# Patient Record
Sex: Male | Born: 1975 | Race: White | Hispanic: No | Marital: Married | Smoking: Never smoker
Health system: Southern US, Community
[De-identification: ages and names within clinical notes are randomized; demographics above are authoritative.]

## PROBLEM LIST (undated history)

## (undated) DIAGNOSIS — I10 Essential (primary) hypertension: Secondary | ICD-10-CM

## (undated) DIAGNOSIS — Z87442 Personal history of urinary calculi: Secondary | ICD-10-CM

## (undated) DIAGNOSIS — G8929 Other chronic pain: Secondary | ICD-10-CM

## (undated) DIAGNOSIS — N200 Calculus of kidney: Secondary | ICD-10-CM

## (undated) DIAGNOSIS — Z800 Family history of malignant neoplasm of digestive organs: Secondary | ICD-10-CM

## (undated) DIAGNOSIS — E785 Hyperlipidemia, unspecified: Secondary | ICD-10-CM

## (undated) DIAGNOSIS — K219 Gastro-esophageal reflux disease without esophagitis: Secondary | ICD-10-CM

## (undated) DIAGNOSIS — M1990 Unspecified osteoarthritis, unspecified site: Secondary | ICD-10-CM

## (undated) HISTORY — DX: Essential (primary) hypertension: I10

## (undated) HISTORY — PX: COLONOSCOPY: SHX174

## (undated) HISTORY — PX: TONSILLECTOMY: SUR1361

## (undated) HISTORY — DX: Family history of malignant neoplasm of digestive organs: Z80.0

## (undated) HISTORY — DX: Other chronic pain: G89.29

## (undated) HISTORY — PX: VASECTOMY: SHX75

## (undated) HISTORY — DX: Hyperlipidemia, unspecified: E78.5

## (undated) HISTORY — PX: HAND SURGERY: SHX662

---
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
## 2006-07-23 ENCOUNTER — Inpatient Hospital Stay (HOSPITAL_COMMUNITY): Admission: AD | Admit: 2006-07-23 | Discharge: 2006-07-25 | Payer: Self-pay | Admitting: Psychiatry

## 2006-07-23 ENCOUNTER — Ambulatory Visit: Payer: Self-pay | Admitting: Psychiatry

## 2008-11-04 ENCOUNTER — Encounter: Admission: RE | Admit: 2008-11-04 | Discharge: 2008-11-04 | Payer: Self-pay | Admitting: Neurosurgery

## 2010-11-17 NOTE — H&P (Signed)
[name], SPEAKMAN NO.:  [id number]   MEDICAL RECORD NO.:  [id number]          PATIENT TYPE:  IPS   LOCATION:  0305                          FACILITY:  [facility]   PHYSICIAN:  Anselm Jungling, MD  DATE OF BIRTH:  Jan 03, 1976   DATE OF ADMISSION:  07/23/2006  DATE OF DISCHARGE:                       PSYCHIATRIC ADMISSION ASSESSMENT   IDENTIFICATION:  This is a 35 year old married white male.  This is a  voluntary admission.   HISTORY OF PRESENT ILLNESS:  This patient presented in the emergency  room after taking an overdose of approximately 20 tablets of Klonopin  0.5 mg after an argument over the phone with his wife.  The patient  reports he has been separated from his wife for about 10 weeks after a  marriage of nine years.  He was arguing with his wife on the phone  trying to coax her to reconcile with him and she mentioned that she was  ready to sell the house.  She has been giving him the impression that  she wants to continue the separation and will not commit to him to  reconcile the marriage.  The patient says that he is unable to accept  the idea that they are separated and that he is without his family.  He  has gone to live with his mother and she is supportive of him.  He is  frustrated with the situation with his wife.  After taking the overdose,  he immediately regretted his actions and drove himself to the [facility] Emergency Room where he was stabilized, observed for several  hours and medically cleared.  Denies any homicidal thoughts or  hallucinations.   PAST PSYCHIATRIC HISTORY:  The patient has been managed by his PCP, Dr.  Bennie Pierini in [place] only.  He has no psychiatrist.  This is his  first psychiatric admission.  He has a history of prior overdose at age  60 over breakup with the girlfriend who happens to have been the same  woman that he has been married to.  At that time, he spent several days  in the ICU but was not  admitted for psychiatric care and had no  psychiatric treatment after the overdose.  He has recently been  prescribed Lexapro 10 mg daily by his PCP which he did not continue to  take because it caused him stomach upset.  No other history of  medications.   SOCIAL HISTORY:  This is a married white male with basic education,  currently working as a Manufacturing engineer.  He has two children,  ages 26 and 54 years old.  They are currently living in the home with his  ex-wife.  He had been married for the past nine years but they had been  together for at least 13 years.  The patient is able to get visitation  with the children on the weekends.  He has gone to live with his mother  who is supportive of him.  He has no current legal charges.   FAMILY HISTORY:  Family history is remarkable for a sister with bipolar  disorder, father with history of suicide attempt by putting a gun in his  mouth when he was younger.   ALCOHOL/DRUG HISTORY:  The patient denies any current or past history of  substance abuse.   MEDICAL HISTORY:  The patient is followed by Dr. Bennie Pierini in  [place], [place].  Current medical problems are occasional  problems with indigestion for which she uses Nexium 40 mg daily.  He has  a history of IBS but takes no particular medicines for this.  On rare  occasions, he will take Imodium 2 mg.  Past medical history is  remarkable for a hospitalization at age 45 for overdose of unknown  substance.  No history of surgeries.  No history of brain injury, coma  or seizure, blackouts or memory loss.  No history of diabetes or  myocardial infarct, hypertension or asthma.   MEDICATIONS:  Klonopin 0.5 mg p.o. b.i.d. p.r.n. for anxiety, Lexapro 10  mg which he stopped taking due to indigestion and Nexium 40 mg p.r.n.  indigestion daily and Imodium 2 mg p.r.n. diarrhea, up to 6 tablets  daily.   ALLERGIES:  None.   POSITIVE PHYSICAL FINDINGS:  The patient's full  physical exam was done  in the emergency room.  He is fully alert, had no respiratory  compromise.  Full physical exam is noted in the record and he did not  receive charcoal or other intervention.  Was closely monitored for about  six hours and was medically cleared.  Vital signs on presentation  include temperature 96.2, pulse 86, respirations 18, blood pressure  151/81 and pulse ox was 100%.   LABORATORY DATA:  Diagnostic studies were done in the emergency room.  CBC with WBC 9.1, hemoglobin 15.2, hematocrit 43.3, platelets 273,000,  MCV is 85.  Chemistries with sodium 134, potassium 3.5, chloride 99,  carbon dioxide 28, BUN 8, creatinine 1.0 and random glucose was 121.  Liver enzymes SGOT 21, SGPT 42, alkaline phosphatase 89 and total  bilirubin is 0.5.  Acetaminophen level was less than 10.  Urinalysis was  within normal limits.  TSH is currently pending.   MENTAL STATUS EXAM:  Fully alert male, pleasant, cooperative, medium  build with affect flattening, slight psychomotor slowing.  He appears to  be quite depressed.  Poor eye contact.  Speech is soft in tone,  decreased in production, adequately fluent and articulate.  Mood is  depressed and helpless.  He talks a lot about his frustration with the  marriage.  Apparently, his wife perceives him as jealous.  He is  attempting to get her to commit to reconciling the marriage.  He is  unable to picture life without his family or the marriage, says he  really loves her and wants to be back together.  Has not thought about  the possibility of what life would be like if she insists on divorce.  Thought process logical and coherent.  His insight is limited.  Impulse  control and judgment within normal limits.  No homicidal thought.  No  evidence of psychosis.  No guarding or flight of ideas.  Cognition is  well-preserved.  Concentration and calculation are intact.   DIAGNOSES:  AXIS I:  Major depression, recurrent, severe.  AXIS II:   Deferred. AXIS III:  History of irritable bowel syndrome, status post  benzodiazepine overdose.  AXIS IV:  Severe (problems with marital conflict and separation, having  a stable home and supportive parent is an asset to him).  AXIS  V:  Current 30; past year 74.   PLAN:  To voluntarily admit the patient to alleviate his suicidal  thought.  We are going to start him on Wellbutrin XL 150 mg daily.  We  will go ahead and resume his Nexium as needed and give him trazodone 50  mg h.s. p.r.n. insomnia and that may be repeated x1.  We are going to  consider the possibility of contacting his wife for a family session,  see if she is willing to talk things out with him here and the patient  is in agreement with the plan.      Margaret A. Lorin Picket, N.P.      Anselm Jungling, MD  Electronically Signed    MAS/MEDQ  D:  07/23/2006  T:  07/23/2006  Job:  [id number]

## 2010-11-17 NOTE — Discharge Summary (Signed)
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  He was admitted after a suicide gesture that  consisted of probably nonlethal dose of Klonopin.  He had been receiving  Klonopin as prescribed by Adam Vaughn, his primary care physician.  This  suicide gesture followed an argument with his wife, from whom he had  been separated for approximately nine weeks.  He had recently been given  trials of Lexapro and Cymbalta by his doctor, but had gastrointestinal  intolerance with both and they were discontinued.  He was not involved  in any alcohol or drug abuse.  He had been seeing a therapist, whose  first name is Adam Vaughn, on a one-to-one basis.  Please refer to the  admission note for further details pertaining to the symptoms,  circumstances and history that led to his hospitalization.   INITIAL DIAGNOSTIC IMPRESSION:  He was given an initial AXIS I diagnosis  of rule out mood disorder not otherwise specified, rule out adjustment  disorder with depressed mood, and marital problem not otherwise  specified.   MEDICAL/LABORATORY:  The patient is in good health without any active or  chronic medical problems.  He was medically and physically assessed by  the psychiatric nurse practitioner.  There were no significant medical  issues.  He had been medically cleared from his overdose in an emergency  department prior to his transfer to [place].   HOSPITAL COURSE:  The patient was admitted to the adult inpatient  psychiatric service.   He presented as a well-nourished, well-developed  male who was pleasant, well-organized, fully alert and oriented.  His  mood was depressed with sad affect.  There were no signs or symptoms of  psychosis or thought disorder.  He was fairly open about his situation,  and it appeared that his current marital situation was the basis for his  current crisis.  He described having been involved with his wife since  he was 83 years old, and having been married for 10 or 11 years.  He  admitted that he has had problems with being pathologically jealous  regarding his wife and somewhat controlling.  This had caused alienation  in their relationship and his wife and asked for more space.  This  included their formal marital separation.  The patient indicated that he  did not want the separation and did not want a divorce.   We discussed a trial of an antidepressant medication and, given that he  had been intolerant of serotonin-inducing medications in the past,  Wellbutrin XL 150 mg daily was chosen.  This was well-tolerated.   We felt  that it would be good for the patient's wife to come in for a  family meeting but, on the first hospital day, she indicated that she  would not come in because of icy roads.  The following day, she was not  able to come in for a family meeting either, ostensibly because she was  not able to get off work.   In further discussion with the patient, he stated that he felt that he  had decided he needed to give his wife the space that she was asking  for, and that perhaps she just was not ready for a family meeting in  this kind of setting.  He indicated that he was not feeling suicidal.  Regarding his overdose, he stated I feel I made a mistake in taking the  pills.  I regret the hell out of it, I don't want to die.   On the third hospital day, the patient indicated that he was feeling  better, and felt a need to return to work.  He was absent suicidal  ideation  still, and agreed to the following aftercare plan.   AFTERCARE:  The patient was to follow up at [facility], for an appointment with Adam Vaughn on July 30, 2006.  Also, on July 30, 2006, he was to be seen by [facility] by his usual therapist, Adam Vaughn.   DISCHARGE MEDICATIONS:  Wellbutrin XL 150 mg daily.   DISCHARGE DIAGNOSES:  AXIS I:  Depressive disorder not otherwise  specified.  Rule out adjustment disorder with depressed mood.  Marital  problem.  AXIS II:  Deferred.  AXIS III:  No acute or chronic illnesses.  AXIS IV:  Stressors:  Severe.  AXIS V:  GAF on discharge 70.      Adam Vaughn, Adam Vaughn  Electronically Signed     SPB/MEDQ  D:  07/26/2006  T:  07/26/2006  Job:  [id number]

## 2014-08-27 ENCOUNTER — Telehealth: Payer: Self-pay | Admitting: Physician Assistant

## 2014-08-27 NOTE — Telephone Encounter (Deleted)
Patient requesting refills of Norco. I have advised that I am comfortable with filling prescription once more until patient is seen by orthopedics, or if he prefers will refer to chronic pain, as I am uncomfortable and not qualified with managing his chronic back pain.  Philis Fendt, MS, PA-C   6:23 PM, 08/27/2014

## 2014-08-28 NOTE — Telephone Encounter (Signed)
Encounter opened in error. Philis Fendt, MS, PA-C   6:36 PM, 08/28/2014

## 2017-07-10 ENCOUNTER — Emergency Department (HOSPITAL_COMMUNITY)
Admission: EM | Admit: 2017-07-10 | Discharge: 2017-07-10 | Disposition: A | Discharge status: Home / Self Care | Payer: Self-pay | Attending: Emergency Medicine | Admitting: Emergency Medicine

## 2017-07-10 ENCOUNTER — Encounter (HOSPITAL_COMMUNITY): Payer: Self-pay | Admitting: *Deleted

## 2017-07-10 ENCOUNTER — Emergency Department (HOSPITAL_COMMUNITY): Payer: Self-pay

## 2017-07-10 DIAGNOSIS — N201 Calculus of ureter: Secondary | ICD-10-CM

## 2017-07-10 DIAGNOSIS — R112 Nausea with vomiting, unspecified: Secondary | ICD-10-CM | POA: Insufficient documentation

## 2017-07-10 DIAGNOSIS — R42 Dizziness and giddiness: Secondary | ICD-10-CM | POA: Insufficient documentation

## 2017-07-10 DIAGNOSIS — R61 Generalized hyperhidrosis: Secondary | ICD-10-CM | POA: Insufficient documentation

## 2017-07-10 DIAGNOSIS — Z87442 Personal history of urinary calculi: Secondary | ICD-10-CM | POA: Insufficient documentation

## 2017-07-10 HISTORY — DX: Calculus of kidney: N20.0

## 2017-07-10 LAB — URINALYSIS, ROUTINE W REFLEX MICROSCOPIC
Bacteria, UA: NONE SEEN
Bilirubin Urine: NEGATIVE
Glucose, UA: NEGATIVE mg/dL
Ketones, ur: NEGATIVE mg/dL
Leukocytes, UA: NEGATIVE
Nitrite: NEGATIVE
Protein, ur: 30 mg/dL — AB
Specific Gravity, Urine: 1.023 (ref 1.005–1.030)
WBC, UA: NONE SEEN WBC/hpf (ref 0–5)
pH: 6 (ref 5.0–8.0)

## 2017-07-10 LAB — COMPREHENSIVE METABOLIC PANEL
ALT: 44 U/L (ref 17–63)
AST: 28 U/L (ref 15–41)
Albumin: 4.2 g/dL (ref 3.5–5.0)
Alkaline Phosphatase: 91 U/L (ref 38–126)
Anion gap: 8 (ref 5–15)
BUN: 14 mg/dL (ref 6–20)
CO2: 22 mmol/L (ref 22–32)
Calcium: 9.5 mg/dL (ref 8.9–10.3)
Chloride: 108 mmol/L (ref 101–111)
Creatinine, Ser: 1.26 mg/dL — ABNORMAL HIGH (ref 0.61–1.24)
GFR calc Af Amer: >60 mL/min (ref >60)
GFR calc non Af Amer: >60 mL/min (ref >60)
Glucose, Bld: 135 mg/dL — ABNORMAL HIGH (ref 65–99)
Potassium: 3.9 mmol/L (ref 3.5–5.1)
Sodium: 138 mmol/L (ref 135–145)
Total Bilirubin: 1.1 mg/dL (ref 0.3–1.2)
Total Protein: 7 g/dL (ref 6.5–8.1)

## 2017-07-10 LAB — CBC
HCT: 44.4 % (ref 39.0–52.0)
Hemoglobin: 15.3 g/dL (ref 13.0–17.0)
MCH: 29.3 pg (ref 26.0–34.0)
MCHC: 34.5 g/dL (ref 30.0–36.0)
MCV: 85.1 fL (ref 78.0–100.0)
Platelets: 267 10*3/uL (ref 150–400)
RBC: 5.22 MIL/uL (ref 4.22–5.81)
RDW: 12.2 % (ref 11.5–15.5)
WBC: 14.4 10*3/uL — ABNORMAL HIGH (ref 4.0–10.5)

## 2017-07-10 MED ORDER — MORPHINE SULFATE (PF) 4 MG/ML IV SOLN
4.0000 mg | Freq: Once | INTRAVENOUS | Status: AC
  Administered 2017-07-10: 4 mg via INTRAVENOUS
  Filled 2017-07-10: qty 1

## 2017-07-10 MED ORDER — TAMSULOSIN HCL 0.4 MG PO CAPS: 0.4000 mg | ORAL_CAPSULE | Freq: Every day | ORAL | 0 refills | Status: DC

## 2017-07-10 MED ORDER — ONDANSETRON HCL 4 MG PO TABS: 4.0000 mg | ORAL_TABLET | Freq: Four times a day (QID) | ORAL | 0 refills | Status: DC

## 2017-07-10 MED ORDER — IBUPROFEN 600 MG PO TABS: 600.0000 mg | ORAL_TABLET | Freq: Four times a day (QID) | ORAL | 0 refills | Status: DC | PRN

## 2017-07-10 MED ORDER — HYDROCODONE-ACETAMINOPHEN 5-325 MG PO TABS: 1.0000 | ORAL_TABLET | Freq: Four times a day (QID) | ORAL | 0 refills | Status: DC | PRN

## 2017-07-10 MED ORDER — SODIUM CHLORIDE 0.9 % IV BOLUS (SEPSIS)
1000.0000 mL | Freq: Once | INTRAVENOUS | Status: AC
  Administered 2017-07-10: 1000 mL via INTRAVENOUS

## 2017-07-10 MED ORDER — ONDANSETRON HCL 4 MG/2ML IJ SOLN
4.0000 mg | Freq: Once | INTRAMUSCULAR | Status: AC
  Administered 2017-07-10: 4 mg via INTRAVENOUS
  Filled 2017-07-10: qty 2

## 2017-07-10 MED ORDER — KETOROLAC TROMETHAMINE 30 MG/ML IJ SOLN
30.0000 mg | Freq: Once | INTRAMUSCULAR | Status: AC
  Administered 2017-07-10: 30 mg via INTRAVENOUS
  Filled 2017-07-10: qty 1

## 2017-07-10 NOTE — Discharge Instructions (Signed)
Medications: Norco, ibuprofen, Zofran, Flomax  Treatment: Take 1-2 Norco every 4-6 hours as needed for severe pain.  Take ibuprofen every 6 hours.  Take Zofran every 6 hours as needed for nausea or vomiting.  Take Flomax once daily after dinner, before bedtime.  Follow-up: Please follow-up with urologist as noted in your discharge paperwork.  Please return to the emergency department if you develop any new or worsening symptoms.

## 2017-07-10 NOTE — ED Notes (Signed)
Care handoff to [facility]

## 2017-07-10 NOTE — ED Notes (Signed)
Pt verbalized understanding of discharge paperwork, prescriptions, and follow-up care. 

## 2017-07-10 NOTE — ED Provider Notes (Signed)
[facility] EMERGENCY DEPARTMENT Provider Note   CSN: [id number] Arrival date & time: 07/10/17  0901     History   Chief Complaint Chief Complaint  Patient presents with  . Abdominal Pain  . Back Pain    HPI Adam Vaughn is a 42 y.o. male with history of kidney stones who presents with a 3-week history of right flank and right lower quadrant pain.  He reports for the first few weeks his pain was dull, however became much worse last night.  He has had nausea and vomiting as well as diaphoresis and lightheadedness.  His pain is sharp.  He reports he feels different than his past kidney stone.  He reports he did have some radiation to his testicles last night.  He denies that now.  He reports he has seen some blood in his urine recently.  He took cranberry pill last night, however denies any other medications prior to arrival. He denies chest pain or shortness of breath.  HPI  Past Medical History:  Diagnosis Date  . Kidney stones     There are no active problems to display for this patient.   Past Surgical History:  Procedure Laterality Date  . TONSILLECTOMY         Home Medications    Prior to Admission medications   Medication Sig Start Date End Date Taking? Authorizing Provider  HYDROcodone-acetaminophen (NORCO/VICODIN) 5-325 MG tablet Take 1-2 tablets by mouth every 6 (six) hours as needed for severe pain. 07/10/17   Burnice Oestreicher, Bea Graff, PA-C  ibuprofen (ADVIL,MOTRIN) 600 MG tablet Take 1 tablet (600 mg total) by mouth every 6 (six) hours as needed. 07/10/17   Margrett Kalb, Bea Graff, PA-C  ondansetron (ZOFRAN) 4 MG tablet Take 1 tablet (4 mg total) by mouth every 6 (six) hours. 07/10/17   Sareena Odeh, Bea Graff, PA-C  tamsulosin (FLOMAX) 0.4 MG CAPS capsule Take 1 capsule (0.4 mg total) by mouth daily after supper. 07/10/17   Frederica Kuster, PA-C    Family History No family history on file.  Social History Social History   Tobacco Use  . Smoking status: Not on  file  Substance Use Topics  . Alcohol use: Not on file  . Drug use: Not on file     Allergies   Patient has no known allergies.   Review of Systems Review of Systems  Constitutional: Positive for diaphoresis. Negative for chills and fever.  HENT: Negative for facial swelling and sore throat.   Respiratory: Negative for shortness of breath.   Cardiovascular: Negative for chest pain.  Gastrointestinal: Positive for abdominal pain, nausea and vomiting. Negative for diarrhea.  Genitourinary: Positive for flank pain (R). Negative for dysuria.  Musculoskeletal: Positive for back pain (R flank).  Skin: Negative for rash and wound.  Neurological: Positive for light-headedness. Negative for headaches.  Psychiatric/Behavioral: The patient is not nervous/anxious.      Physical Exam Updated Vital Signs BP 133/81 (BP Location: Right Arm)   Pulse 75   Temp 98.4 F (36.9 C) (Oral)   Resp 17   SpO2 99%   Physical Exam  Constitutional: He appears well-developed and well-nourished. No distress.  HENT:  Head: Normocephalic and atraumatic.  Mouth/Throat: Oropharynx is clear and moist. No oropharyngeal exudate.  Eyes: Conjunctivae are normal. Pupils are equal, round, and reactive to light. Right eye exhibits no discharge. Left eye exhibits no discharge. No scleral icterus.  Neck: Normal range of motion. Neck supple. No thyromegaly present.  Cardiovascular: Normal  rate, regular rhythm, normal heart sounds and intact distal pulses. Exam reveals no gallop and no friction rub.  No murmur heard. Pulmonary/Chest: Effort normal and breath sounds normal. No stridor. No respiratory distress. He has no wheezes. He has no rales.  Abdominal: Soft. Bowel sounds are normal. He exhibits no distension. There is tenderness in the right lower quadrant and periumbilical area. There is CVA tenderness (R) and tenderness at McBurney's point. There is no rebound, no guarding and negative Murphy's sign.   Musculoskeletal: He exhibits no edema.  Lymphadenopathy:    He has no cervical adenopathy.  Neurological: He is alert. Coordination normal.  Skin: Skin is warm and dry. No rash noted. He is not diaphoretic. No pallor.  Psychiatric: He has a normal mood and affect.  Nursing note and vitals reviewed.    ED Treatments / Results  Labs (all labs ordered are listed, but only abnormal results are displayed) Labs Reviewed  COMPREHENSIVE METABOLIC PANEL - Abnormal; Notable for the following components:      Result Value   Glucose, Bld 135 (*)    Creatinine, Ser 1.26 (*)    All other components within normal limits  CBC - Abnormal; Notable for the following components:   WBC 14.4 (*)    All other components within normal limits  URINALYSIS, ROUTINE W REFLEX MICROSCOPIC - Abnormal; Notable for the following components:   APPearance HAZY (*)    Hgb urine dipstick LARGE (*)    Protein, ur 30 (*)    Squamous Epithelial / LPF 0-5 (*)    All other components within normal limits    EKG  EKG Interpretation None       Radiology Ct Renal Stone Study  Result Date: 07/10/2017 CLINICAL DATA:  Right flank pain, hematuria EXAM: CT ABDOMEN AND PELVIS WITHOUT CONTRAST TECHNIQUE: Multidetector CT imaging of the abdomen and pelvis was performed following the standard protocol without IV contrast. COMPARISON:  02/09/2011 FINDINGS: Lower chest: Lung bases are clear. No effusions. Heart is normal size. Hepatobiliary: No focal hepatic abnormality. Gallbladder unremarkable. Pancreas: No focal abnormality or ductal dilatation. Spleen: No focal abnormality.  Normal size. Adrenals/Urinary Tract: Numerous bilateral nonobstructing renal stones. There is a proximal right ureteral stone measuring 4 mm with mild right hydronephrosis and perinephric stranding. No additional ureteral stones. Urinary bladder is decompressed, grossly unremarkable. No adrenal mass. Stomach/Bowel: Appendix is normal. Stomach, large and  small bowel grossly unremarkable. Vascular/Lymphatic: No evidence of aneurysm or adenopathy. Reproductive: No visible focal abnormality. Other: No free fluid or free air. Musculoskeletal: No acute bony abnormality. IMPRESSION: Bilateral nephrolithiasis. 4 mm proximal right ureteral stone near the UPJ with mild right hydronephrosis and perinephric stranding. Electronically Signed   By: Rolm Baptise M.D.   On: 07/10/2017 11:19    Procedures Procedures (including critical care time)  Medications Ordered in ED Medications  sodium chloride 0.9 % bolus 1,000 mL (0 mLs Intravenous Stopped 07/10/17 1219)  ondansetron (ZOFRAN) injection 4 mg (4 mg Intravenous Given 07/10/17 1129)  morphine 4 MG/ML injection 4 mg (4 mg Intravenous Given 07/10/17 1129)  ketorolac (TORADOL) 30 MG/ML injection 30 mg (30 mg Intravenous Given 07/10/17 1152)     Initial Impression / Assessment and Plan / ED Course  I have reviewed the triage vital signs and the nursing notes.  Pertinent labs & imaging results that were available during my care of the patient were reviewed by me and considered in my medical decision making (see chart for details).  Pt has been diagnosed with a 91mm UPJ stone via CT. There is no evidence of significant hydronephrosis, serum creatine WNL, vitals sign stable and the pt does not have irratractable vomiting. Pt will be dc home with pain medications, Zofran, Flomax & has been advised to follow up with urology.  Pain is controlled in the ED prior to discharge.  He is tolerating oral fluids.  Will discharge home with strict return precautions.  Patient vitals stable throughout ED course and discharged in satisfactory condition.   Final Clinical Impressions(s) / ED Diagnoses   Final diagnoses:  Ureterolithiasis    ED Discharge Orders        Ordered    HYDROcodone-acetaminophen (NORCO/VICODIN) 5-325 MG tablet  Every 6 hours PRN     07/10/17 1209    ibuprofen (ADVIL,MOTRIN) 600 MG tablet  Every 6  hours PRN     07/10/17 1209    ondansetron (ZOFRAN) 4 MG tablet  Every 6 hours     07/10/17 1209    tamsulosin (FLOMAX) 0.4 MG CAPS capsule  Daily after supper     07/10/17 [address], PA-C 07/10/17 1246    Tanna Furry, MD 07/12/17 [phone]

## 2017-07-10 NOTE — ED Notes (Signed)
Returned from CT.

## 2017-07-10 NOTE — ED Triage Notes (Signed)
To ED for eval of right flank pain and RLQ pain severe since last pm. States he has had dull pain for the past few weeks but severe since last pm. States he has noted hematuria a couple of times since this pain started. Unable to drink anything since last pm due to n/v. Unable to get comfortable

## 2017-07-10 NOTE — ED Notes (Signed)
Patient transported to CT 

## 2019-07-21 ENCOUNTER — Emergency Department (HOSPITAL_COMMUNITY)
Admission: EM | Admit: 2019-07-21 | Discharge: 2019-07-21 | Disposition: A | Discharge status: Home / Self Care | Payer: Managed Care, Other (non HMO) | Attending: Emergency Medicine | Admitting: Emergency Medicine

## 2019-07-21 ENCOUNTER — Other Ambulatory Visit: Payer: Self-pay

## 2019-07-21 ENCOUNTER — Emergency Department (HOSPITAL_COMMUNITY): Payer: Managed Care, Other (non HMO)

## 2019-07-21 DIAGNOSIS — X501XXA Overexertion from prolonged static or awkward postures, initial encounter: Secondary | ICD-10-CM | POA: Diagnosis not present

## 2019-07-21 DIAGNOSIS — S92415A Nondisplaced fracture of proximal phalanx of left great toe, initial encounter for closed fracture: Secondary | ICD-10-CM | POA: Diagnosis not present

## 2019-07-21 DIAGNOSIS — Z79899 Other long term (current) drug therapy: Secondary | ICD-10-CM | POA: Insufficient documentation

## 2019-07-21 DIAGNOSIS — Y9389 Activity, other specified: Secondary | ICD-10-CM | POA: Insufficient documentation

## 2019-07-21 DIAGNOSIS — Y9279 Other farm location as the place of occurrence of the external cause: Secondary | ICD-10-CM | POA: Insufficient documentation

## 2019-07-21 DIAGNOSIS — Y998 Other external cause status: Secondary | ICD-10-CM | POA: Insufficient documentation

## 2019-07-21 DIAGNOSIS — S99922A Unspecified injury of left foot, initial encounter: Secondary | ICD-10-CM | POA: Diagnosis present

## 2019-07-21 DIAGNOSIS — R52 Pain, unspecified: Secondary | ICD-10-CM

## 2019-07-21 NOTE — Progress Notes (Signed)
Orthopedic Tech Progress Note Patient Details:  Adam Vaughn 1976-02-02 [id number]  Ortho Devices Type of Ortho Device: CAM walker Ortho Device/Splint Location: LLE Ortho Device/Splint Interventions: Ordered, Application   Post Interventions Patient Tolerated: Well Instructions Provided: Care of device   Braulio Bosch 07/21/2019, 9:56 PM

## 2019-07-21 NOTE — ED Triage Notes (Signed)
States felt something "pop" in right foot- between ball of foot and heel, unable to weight on it.

## 2019-07-21 NOTE — Discharge Instructions (Signed)
You have been diagnosed today with closed left proximal phalanx fracture of the first toe.  At this time there does not appear to be the presence of an emergent medical condition, however there is always the potential for conditions to change. Please read and follow the below instructions.  Please return to the Emergency Department immediately for any new or worsening symptoms. Please be sure to follow up with your Primary Care Provider within one week regarding your visit today; please call their office to schedule an appointment even if you are feeling better for a follow-up visit. Please continue wearing the cam walker boot and use the crutches to protect your toe from further injury.  Please avoid placing weight on her toe to avoid further injury.  Use rest, ice and elevation to help with pain and swelling.  Please call the orthopedic specialist Dr. Doran Durand on your discharge paperwork tomorrow morning to schedule follow-up appointment for further evaluation and management.   Get help right away if: You lose feeling (have numbness) in your toe or foot, and it is getting worse. Your toe or your foot tingles. Your toe or your foot gets cold or turns blue. You have redness or swelling in your toe or foot, and it is getting worse. You have very bad pain. You have any new/concerning or worsening of symptoms  Please read the additional information packets attached to your discharge summary.  Do not take your medicine if  develop an itchy rash, swelling in your mouth or lips, or difficulty breathing; call 911 and seek immediate emergency medical attention if this occurs.  Note: Portions of this text may have been transcribed using voice recognition software. Every effort was made to ensure accuracy; however, inadvertent computerized transcription errors may still be present.

## 2019-07-21 NOTE — ED Provider Notes (Signed)
[facility] DEPART[facility] Provider Note   CSN: [id number] Arrival date & time: 07/21/19  1849     History Chief Complaint  Patient presents with  . Foot Pain    Adam Vaughn is a 44 y.o. male presents today for left foot pain.  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  He ports he felt a pop to his left foot in the midfoot area.  He reports an immediate onset moderate intensity aching throbbing constant worsened with ambulation improved with rest nonradiating.   He denies fall, head injury, pain of the torso or other extremities, numbness/tingling, weakness, skin break, bruising/bleeding or any additional concerns.  Of note patient reports that he has been dealing with plantars fasciitis of his left foot.  HPI     Past Medical History:  Diagnosis Date  . Kidney stones     There are no problems to display for this patient.   Past Surgical History:  Procedure Laterality Date  . TONSILLECTOMY         No family history on file.  Social History   Tobacco Use  . Smoking status: Not on file  Substance Use Topics  . Alcohol use: Not on file  . Drug use: Not on file    Home Medications Prior to Admission medications   Medication Sig Start Date End Date Taking? Authorizing Provider  HYDROcodone-acetaminophen (NORCO/VICODIN) 5-325 MG tablet Take 1-2 tablets by mouth every 6 (six) hours as needed for severe pain. 07/10/17   Law, Bea Graff, PA-C  ibuprofen (ADVIL,MOTRIN) 600 MG tablet Take 1 tablet (600 mg total) by mouth every 6 (six) hours as needed. 07/10/17   Law, Bea Graff, PA-C  ondansetron (ZOFRAN) 4 MG tablet Take 1 tablet (4 mg total) by mouth every 6 (six) hours. 07/10/17   Law, Bea Graff, PA-C  tamsulosin (FLOMAX) 0.4 MG CAPS capsule Take 1 capsule (0.4 mg total) by mouth daily after supper. 07/10/17   Frederica Kuster, PA-C    Allergies    Patient has no known  allergies.  Review of Systems   Review of Systems Ten systems are reviewed and are negative for acute change except as noted in the HPI  Physical Exam Updated Vital Signs BP 124/88 (BP Location: Right Arm)   Pulse 85   Temp 98.5 F (36.9 C) (Oral)   Resp 16   SpO2 99%   Physical Exam Constitutional:      General: He is not in acute distress.    Appearance: Normal appearance. He is well-developed. He is not ill-appearing or diaphoretic.  HENT:     Head: Normocephalic and atraumatic.     Right Ear: External ear normal.     Left Ear: External ear normal.     Nose: Nose normal.  Eyes:     General: Vision grossly intact. Gaze aligned appropriately.     Pupils: Pupils are equal, round, and reactive to light.  Neck:     Trachea: Trachea and phonation normal. No tracheal deviation.  Cardiovascular:     Pulses:          Dorsalis pedis pulses are 2+ on the right side.       Posterior tibial pulses are 2+ on the right side and 2+ on the left side.  Pulmonary:     Effort: Pulmonary effort is normal. No respiratory distress.  Abdominal:     General: There  is no distension.     Palpations: Abdomen is soft.     Tenderness: There is no abdominal tenderness. There is no guarding or rebound.  Musculoskeletal:        General: Normal range of motion.     Cervical back: Normal range of motion.     Right knee: Normal.     Left knee: Normal.     Right lower leg: Normal.     Left lower leg: Normal.     Right ankle: Normal.     Right Achilles Tendon: Normal. No tenderness or defects.     Left ankle: Normal.     Left Achilles Tendon: Normal. No tenderness or defects.     Right foot: Normal range of motion. No deformity or tenderness.     Left foot: Normal range of motion. Tenderness present. No deformity.       Feet:  Feet:     Right foot:     Protective Sensation: 5 sites tested. 5 sites sensed.     Skin integrity: Skin integrity normal.     Left foot:     Protective Sensation: 5  sites tested. 5 sites sensed.     Skin integrity: Skin integrity normal.     Comments: Tenderness of the sole of the left foot from base of first proximal phalanx towards arch.  No overlying skin changes. Skin:    General: Skin is warm and dry.  Neurological:     Mental Status: He is alert.     GCS: GCS eye subscore is 4. GCS verbal subscore is 5. GCS motor subscore is 6.     Comments: Speech is clear and goal oriented, follows commands Major Cranial nerves without deficit, no facial droop Moves extremities without ataxia, coordination intact  Psychiatric:        Behavior: Behavior normal.     ED Results / Procedures / Treatments   Labs (all labs ordered are listed, but only abnormal results are displayed) Labs Reviewed - No data to display  EKG None  Radiology DG Foot 2 Views Left  Result Date: 07/21/2019 CLINICAL DATA:  Pain after jumping from fence EXAM: LEFT FOOT - 2 VIEW COMPARISON:  None. FINDINGS: Frontal and lateral views were obtained. There is a subtle lucency along the lateral, proximal most aspect of the first proximal phalanx. No other findings suspicious for potential fracture. No dislocation. Joint spaces appear normal. No erosive change. There are small posterior and inferior calcaneal spurs. IMPRESSION: Lucency along the lateral most aspect of the proximal portion of the first proximal phalanx, a finding concerning for nondisplaced fracture in this area. No other findings suggesting potential fracture. No dislocation. No appreciable joint space narrowing or erosion. There are small calcaneal spurs. Electronically Signed   By: Lowella Grip III M.D.   On: 07/21/2019 20:10    Procedures Procedures (including critical care time)  Medications Ordered in ED Medications - No data to display  ED Course  I have reviewed the triage vital signs and the nursing notes.  Pertinent labs & imaging results that were available during my care of the patient were reviewed by  me and considered in my medical decision making (see chart for details).    MDM Rules/Calculators/A&P                     44 year old male presents today for pain of the left foot after jumping over a small fence.  No other pain or concerns today.  He has tenderness of the arch of the left foot as well as the base of the first proximal phalanx.  He is neurovascular intact to the extremity with good capillary refill sensation to all toes of both feet.  He is equal pedal pulses bilaterally.  He can flex and extend all toes with some increase in pain of the great toe.  Movement of bilateral ankles, knees and hips intact without pain.  There is no evidence of DVT, cellulitis, septic arthritis, compartment syndrome or other emergent pathologies.  Achilles intact without pain. There is no skin break or overlying skin changes.  X-ray of the left foot shows a lucency along the lateral most aspect of the proximal portion of the first proximal phalanx concerning for nondisplaced fracture.  Suspect this may be cause of patient's pain today, additionally he reports history of plantar fasciitis which may be exacerbated by his jump today.  Patient has been placed in a cam walker and given crutches, informed to remain nonweightbearing on the extremity, use RICE therapy and call orthopedist tomorrow morning to schedule a follow-up appointment for further evaluation and treatment.  There is no indication for further work-up or imaging in the emergency department at this time.  At this time there does not appear to be any evidence of an acute emergency medical condition and the patient appears stable for discharge with appropriate outpatient follow up. Diagnosis was discussed with patient who verbalizes understanding of care plan and is agreeable to discharge. I have discussed return precautions with patient who verbalizes understanding of return precautions. Patient encouraged to follow-up with their PCP. All questions  answered.  Note: Portions of this report may have been transcribed using voice recognition software. Every effort was made to ensure accuracy; however, inadvertent computerized transcription errors may still be present. Final Clinical Impression(s) / ED Diagnoses Final diagnoses:  Closed nondisplaced fracture of proximal phalanx of left great toe, initial encounter    Rx / DC Orders ED Discharge Orders    None       Gari Crown 07/21/19 2208    Blanchie Dessert, MD 07/22/19 1041

## 2019-07-21 NOTE — ED Notes (Signed)
Pt verbalized understanding of discharge instructions. Cam boot placed by ortho and crutches provided. Pt ambulated w/ crutches to lobby independently.

## 2019-07-21 NOTE — ED Notes (Signed)
Ortho paged for cam walker and crutches

## 2020-01-12 ENCOUNTER — Other Ambulatory Visit: Payer: Self-pay

## 2020-01-12 ENCOUNTER — Ambulatory Visit (INDEPENDENT_AMBULATORY_CARE_PROVIDER_SITE_OTHER): Payer: Managed Care, Other (non HMO) | Admitting: Internal Medicine

## 2020-01-12 ENCOUNTER — Encounter: Payer: Self-pay | Admitting: Internal Medicine

## 2020-01-12 VITALS — BP 140/88 | HR 99 | Temp 98.8°F | Ht 68.0 in | Wt 215.0 lb

## 2020-01-12 DIAGNOSIS — N200 Calculus of kidney: Secondary | ICD-10-CM | POA: Diagnosis not present

## 2020-01-12 DIAGNOSIS — K580 Irritable bowel syndrome with diarrhea: Secondary | ICD-10-CM | POA: Diagnosis not present

## 2020-01-12 DIAGNOSIS — K589 Irritable bowel syndrome without diarrhea: Secondary | ICD-10-CM | POA: Insufficient documentation

## 2020-01-12 DIAGNOSIS — M545 Low back pain, unspecified: Secondary | ICD-10-CM | POA: Insufficient documentation

## 2020-01-12 DIAGNOSIS — K219 Gastro-esophageal reflux disease without esophagitis: Secondary | ICD-10-CM

## 2020-01-12 DIAGNOSIS — E559 Vitamin D deficiency, unspecified: Secondary | ICD-10-CM

## 2020-01-12 DIAGNOSIS — N529 Male erectile dysfunction, unspecified: Secondary | ICD-10-CM | POA: Diagnosis not present

## 2020-01-12 DIAGNOSIS — G8929 Other chronic pain: Secondary | ICD-10-CM

## 2020-01-12 MED ORDER — CELECOXIB 200 MG PO CAPS: 200.0000 mg | ORAL_CAPSULE | Freq: Every day | ORAL | 1 refills | Status: DC

## 2020-01-12 MED ORDER — TADALAFIL 20 MG PO TABS: 20.0000 mg | ORAL_TABLET | Freq: Every day | ORAL | 0 refills | Status: DC | PRN

## 2020-01-12 MED ORDER — VITAMIN D3 50 MCG (2000 UT) PO CAPS: 2000.0000 [IU] | ORAL_CAPSULE | Freq: Every day | ORAL | 3 refills | Status: DC

## 2020-01-12 NOTE — Assessment & Plan Note (Addendum)
IBS-D Labs

## 2020-01-12 NOTE — Assessment & Plan Note (Addendum)
Labs Treat LBP Cialis 5, 10 or 20 mg prn

## 2020-01-12 NOTE — Assessment & Plan Note (Addendum)
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 Mild facet and ligamentous  hypertrophy. Spinal stenosis with potential for neural compression  in either or both lateral recesses.    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   LS X ray Celebrex w/caution HLA B27 Wt loss Exercise

## 2020-01-12 NOTE — Assessment & Plan Note (Signed)
H pylori test Nexium prn

## 2020-01-12 NOTE — Patient Instructions (Addendum)
[place]enzie inflatable pillow   These suggestions will probably help you to improve your metabolism if you are not overweight and to lose weight if you are overweight: 1.  Reduce your consumption of sugars and starches.  Eliminate high fructose corn syrup from your diet.  Reduce your consumption of processed foods.  For desserts try to have seasonal fruits, berries, nuts, cheeses or dark chocolate with more than 70% cacao. 2.  Do not snack 3.  You do not have to eat breakfast.  If you choose to have breakfast-eat plain greek yogurt, eggs, oatmeal (without sugar) 4.  Drink water, freshly brewed unsweetened tea (green, black or herbal) or coffee.  Do not drink sodas including diet sodas , juices, beverages sweetened with artificial sweeteners. 5.  Reduce your consumption of refined grains. 6.  Avoid protein drinks such as Optifast, Slim fast etc. Eat chicken, fish, meat, dairy and beans for your sources of protein 7.  Natural unprocessed fats like cold pressed virgin olive oil, butter, coconut oil are good for you.  Eat avocados 8.  Increase your consumption of fiber.  Fruits, berries, vegetables, whole grains, flaxseeds, Chia seeds, beans, popcorn, nuts, oatmeal are good sources of fiber 9.  Use vinegar in your diet, i.e. apple cider vinegar, red wine or balsamic vinegar 10.  You can try fasting.  For example you can skip breakfast and lunch every other day (24-hour fast) 11.  Stress reduction, good night sleep, relaxation, meditation, yoga and other physical activity is likely to help you to maintain low weight too. 12.  If you drink alcohol, limit your alcohol intake to no more than 2 drinks a day.

## 2020-01-12 NOTE — Assessment & Plan Note (Signed)
Multiple episodes

## 2020-01-12 NOTE — Progress Notes (Signed)
Subjective:  Patient ID: Adam Vaughn, male    DOB: Jun 10, 1976  Age: 44 y.o. MRN: [id number]  CC: No chief complaint on file.   HPI Adam Vaughn presents for severe chronic LBP isince his 42s MRI was done long time ago H/o repeat kidney stones C/o ED x months C/o IBS x years, GERD Using tylenol, BC for LBP 2 a day  LBP is worse in am, stiffness   Outpatient Medications Prior to Visit  Medication Sig Dispense Refill  . ibuprofen (ADVIL,MOTRIN) 600 MG tablet Take 1 tablet (600 mg total) by mouth every 6 (six) hours as needed. 30 tablet 0  . HYDROcodone-acetaminophen (NORCO/VICODIN) 5-325 MG tablet Take 1-2 tablets by mouth every 6 (six) hours as needed for severe pain. (Patient not taking: Reported on 01/12/2020) 15 tablet 0  . ondansetron (ZOFRAN) 4 MG tablet Take 1 tablet (4 mg total) by mouth every 6 (six) hours. (Patient not taking: Reported on 01/12/2020) 12 tablet 0  . tamsulosin (FLOMAX) 0.4 MG CAPS capsule Take 1 capsule (0.4 mg total) by mouth daily after supper. 7 capsule 0   No facility-administered medications prior to visit.    ROS: Review of Systems  Constitutional: Positive for unexpected weight change. Negative for appetite change and fatigue.  HENT: Negative for congestion, nosebleeds, sneezing, sore throat and trouble swallowing.   Eyes: Negative for itching and visual disturbance.  Respiratory: Negative for cough.   Cardiovascular: Negative for chest pain, palpitations and leg swelling.  Gastrointestinal: Negative for abdominal distention, blood in stool, diarrhea and nausea.  Genitourinary: Negative for frequency and hematuria.  Musculoskeletal: Positive for arthralgias, back pain and gait problem. Negative for joint swelling and neck pain.  Skin: Negative for rash.  Neurological: Negative for dizziness, tremors, speech difficulty and weakness.  Psychiatric/Behavioral: Negative for agitation, decreased concentration, dysphoric mood and sleep  disturbance. The patient is not nervous/anxious.     Objective:  BP 140/88 (BP Location: Right Arm, Patient Position: Sitting, Cuff Size: Large)   Pulse 99   Temp 98.8 F (37.1 C) (Oral)   Ht 5\' 8"  (1.727 m)   Wt 215 lb (97.5 kg)   SpO2 99%   BMI 32.69 kg/m   BP Readings from Last 3 Encounters:  01/12/20 140/88  07/21/19 124/88  07/10/17 133/81    Wt Readings from Last 3 Encounters:  01/12/20 215 lb (97.5 kg)    Physical Exam Constitutional:      General: He is not in acute distress.    Appearance: He is well-developed. He is obese.     Comments: NAD  Eyes:     Conjunctiva/sclera: Conjunctivae normal.     Pupils: Pupils are equal, round, and reactive to light.  Neck:     Thyroid: No thyromegaly.     Vascular: No JVD.  Cardiovascular:     Rate and Rhythm: Normal rate and regular rhythm.     Heart sounds: Normal heart sounds. No murmur heard.  No friction rub. No gallop.   Pulmonary:     Effort: Pulmonary effort is normal. No respiratory distress.     Breath sounds: Normal breath sounds. No wheezing or rales.  Chest:     Chest wall: No tenderness.  Abdominal:     General: Bowel sounds are normal. There is no distension.     Palpations: Abdomen is soft. There is no mass.     Tenderness: There is no abdominal tenderness. There is no guarding or rebound.  Musculoskeletal:  General: Tenderness present. Normal range of motion.     Cervical back: Normal range of motion.  Lymphadenopathy:     Cervical: No cervical adenopathy.  Skin:    General: Skin is warm and dry.     Findings: No rash.  Neurological:     Mental Status: He is alert and oriented to person, place, and time.     Cranial Nerves: No cranial nerve deficit.     Motor: No abnormal muscle tone.     Coordination: Coordination normal.     Gait: Gait abnormal.     Deep Tendon Reflexes: Reflexes are normal and symmetric.  Psychiatric:        Behavior: Behavior normal.        Thought Content: Thought  content normal.        Judgment: Judgment normal.   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 Mild facet and ligamentous  hypertrophy. Spinal stenosis with potential for neural compression  in either or both lateral recesses.    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   Lab Results  Component Value Date   WBC 14.4 (H) 07/10/2017   HGB 15.3 07/10/2017   HCT 44.4 07/10/2017   PLT 267 07/10/2017   GLUCOSE 135 (H) 07/10/2017   ALT 44 07/10/2017   AST 28 07/10/2017   NA 138 07/10/2017   K 3.9 07/10/2017   CL 108 07/10/2017   CREATININE 1.26 (H) 07/10/2017   BUN 14 07/10/2017   CO2 22 07/10/2017    DG Foot 2 Views Left  Result Date: 07/21/2019 CLINICAL DATA:  Pain after jumping from fence EXAM: LEFT FOOT - 2 VIEW COMPARISON:  None. FINDINGS: Frontal and lateral views were obtained. There is a subtle lucency along the lateral, proximal most aspect of the first proximal phalanx. No other findings suspicious for potential fracture. No dislocation. Joint spaces appear normal. No erosive change. There are small posterior and inferior calcaneal spurs. IMPRESSION: Lucency along the lateral most aspect of the proximal portion of the first proximal phalanx, a finding concerning for nondisplaced fracture in this area. No other findings suggesting potential fracture. No dislocation. No appreciable joint space narrowing or erosion. There are small calcaneal spurs. Electronically Signed   By: Lowella Grip III M.D.   On: 07/21/2019 20:10    Assessment & Plan:    Walker Kehr, MD

## 2020-01-14 ENCOUNTER — Ambulatory Visit (INDEPENDENT_AMBULATORY_CARE_PROVIDER_SITE_OTHER)
Admission: RE | Admit: 2020-01-14 | Discharge: 2020-01-14 | Disposition: A | Discharge status: Home / Self Care | Payer: Managed Care, Other (non HMO) | Source: Ambulatory Visit | Attending: Internal Medicine | Admitting: Internal Medicine

## 2020-01-14 ENCOUNTER — Other Ambulatory Visit: Payer: Self-pay

## 2020-01-14 ENCOUNTER — Other Ambulatory Visit (INDEPENDENT_AMBULATORY_CARE_PROVIDER_SITE_OTHER): Payer: Managed Care, Other (non HMO)

## 2020-01-14 DIAGNOSIS — G8929 Other chronic pain: Secondary | ICD-10-CM

## 2020-01-14 DIAGNOSIS — K580 Irritable bowel syndrome with diarrhea: Secondary | ICD-10-CM | POA: Diagnosis not present

## 2020-01-14 DIAGNOSIS — M545 Low back pain, unspecified: Secondary | ICD-10-CM

## 2020-01-14 LAB — URINALYSIS
Bilirubin Urine: NEGATIVE
Hgb urine dipstick: NEGATIVE
Ketones, ur: NEGATIVE
Leukocytes,Ua: NEGATIVE
Nitrite: NEGATIVE
Specific Gravity, Urine: 1.02 (ref 1.000–1.030)
Total Protein, Urine: NEGATIVE
Urine Glucose: NEGATIVE
Urobilinogen, UA: 0.2 (ref 0.0–1.0)
pH: 7 (ref 5.0–8.0)

## 2020-01-14 LAB — TESTOSTERONE: Testosterone: 333.92 ng/dL (ref 300.00–890.00)

## 2020-01-14 LAB — HEPATIC FUNCTION PANEL
ALT: 54 U/L — ABNORMAL HIGH (ref 0–53)
AST: 23 U/L (ref 0–37)
Albumin: 4.7 g/dL (ref 3.5–5.2)
Alkaline Phosphatase: 94 U/L (ref 39–117)
Bilirubin, Direct: 0.1 mg/dL (ref 0.0–0.3)
Total Bilirubin: 0.5 mg/dL (ref 0.2–1.2)
Total Protein: 6.8 g/dL (ref 6.0–8.3)

## 2020-01-14 LAB — CBC WITH DIFFERENTIAL/PLATELET
Basophils Absolute: 0 10*3/uL (ref 0.0–0.1)
Basophils Relative: 0.4 % (ref 0.0–3.0)
Eosinophils Absolute: 0.1 10*3/uL (ref 0.0–0.7)
Eosinophils Relative: 0.8 % (ref 0.0–5.0)
HCT: 43.2 % (ref 39.0–52.0)
Hemoglobin: 15 g/dL (ref 13.0–17.0)
Lymphocytes Relative: 17.5 % (ref 12.0–46.0)
Lymphs Abs: 1.2 10*3/uL (ref 0.7–4.0)
MCHC: 34.7 g/dL (ref 30.0–36.0)
MCV: 86.6 fl (ref 78.0–100.0)
Monocytes Absolute: 0.7 10*3/uL (ref 0.1–1.0)
Monocytes Relative: 9.9 % (ref 3.0–12.0)
Neutro Abs: 5 10*3/uL (ref 1.4–7.7)
Neutrophils Relative %: 71.4 % (ref 43.0–77.0)
Platelets: 237 10*3/uL (ref 150.0–400.0)
RBC: 4.99 Mil/uL (ref 4.22–5.81)
RDW: 12.3 % (ref 11.5–15.5)
WBC: 7 10*3/uL (ref 4.0–10.5)

## 2020-01-14 LAB — BASIC METABOLIC PANEL
BUN: 14 mg/dL (ref 6–23)
CO2: 27 mEq/L (ref 19–32)
Calcium: 9.5 mg/dL (ref 8.4–10.5)
Chloride: 105 mEq/L (ref 96–112)
Creatinine, Ser: 0.96 mg/dL (ref 0.40–1.50)
GFR: 84.94 mL/min (ref >60.00)
Glucose, Bld: 115 mg/dL — ABNORMAL HIGH (ref 70–99)
Potassium: 4.3 mEq/L (ref 3.5–5.1)
Sodium: 141 mEq/L (ref 135–145)

## 2020-01-14 LAB — VITAMIN D 25 HYDROXY (VIT D DEFICIENCY, FRACTURES): VITD: 15.48 ng/mL — ABNORMAL LOW (ref 30.00–100.00)

## 2020-01-14 LAB — TSH: TSH: 1.26 u[IU]/mL (ref 0.35–4.50)

## 2020-01-14 LAB — H. PYLORI ANTIBODY, IGG: H Pylori IgG: NEGATIVE

## 2020-01-14 LAB — T4, FREE: Free T4: 0.8 ng/dL (ref 0.60–1.60)

## 2020-01-14 LAB — SEDIMENTATION RATE: Sed Rate: 12 mm/hr (ref 0–15)

## 2020-01-14 LAB — VITAMIN B12: Vitamin B-12: 284 pg/mL (ref 211–911)

## 2020-01-15 ENCOUNTER — Encounter: Payer: Self-pay | Admitting: Internal Medicine

## 2020-01-15 ENCOUNTER — Other Ambulatory Visit: Payer: Self-pay | Admitting: Internal Medicine

## 2020-01-15 DIAGNOSIS — E559 Vitamin D deficiency, unspecified: Secondary | ICD-10-CM | POA: Insufficient documentation

## 2020-01-15 LAB — HLA-B27 ANTIGEN: HLA-B27 Antigen: NEGATIVE

## 2020-01-15 MED ORDER — FAMOTIDINE 40 MG PO TABS: 40.0000 mg | ORAL_TABLET | Freq: Every day | ORAL | 3 refills | Status: DC

## 2020-01-15 MED ORDER — B COMPLEX PO TABS: 1.0000 | ORAL_TABLET | Freq: Every day | ORAL | 3 refills | Status: DC

## 2020-01-15 MED ORDER — VITAMIN D3 1.25 MG (50000 UT) PO CAPS: 1.0000 | ORAL_CAPSULE | ORAL | 0 refills | Status: DC

## 2020-01-19 NOTE — Assessment & Plan Note (Signed)
Start Vit D po

## 2020-01-20 ENCOUNTER — Encounter: Payer: Self-pay | Admitting: Internal Medicine

## 2020-01-20 ENCOUNTER — Ambulatory Visit (INDEPENDENT_AMBULATORY_CARE_PROVIDER_SITE_OTHER): Payer: Managed Care, Other (non HMO) | Admitting: Internal Medicine

## 2020-01-20 ENCOUNTER — Other Ambulatory Visit: Payer: Self-pay

## 2020-01-20 VITALS — BP 140/80 | HR 94 | Temp 98.7°F | Resp 16 | Ht 68.0 in | Wt 218.0 lb

## 2020-01-20 DIAGNOSIS — M5136 Other intervertebral disc degeneration, lumbar region: Secondary | ICD-10-CM | POA: Diagnosis not present

## 2020-01-20 DIAGNOSIS — M5416 Radiculopathy, lumbar region: Secondary | ICD-10-CM | POA: Insufficient documentation

## 2020-01-20 MED ORDER — OXYCODONE-ACETAMINOPHEN 7.5-325 MG PO TABS: 1.0000 | ORAL_TABLET | Freq: Three times a day (TID) | ORAL | 0 refills | Status: DC | PRN

## 2020-01-20 MED ORDER — METHYLPREDNISOLONE 4 MG PO TBPK: ORAL_TABLET | ORAL | 0 refills | Status: DC

## 2020-01-20 MED ORDER — TIZANIDINE HCL 2 MG PO CAPS: 2.0000 mg | ORAL_CAPSULE | Freq: Three times a day (TID) | ORAL | 1 refills | Status: DC | PRN

## 2020-01-20 NOTE — Patient Instructions (Signed)
Acute Back Pain, Adult Acute back pain is sudden and usually short-lived. It is often caused by an injury to the muscles and tissues in the back. The injury may result from:  A muscle or ligament getting overstretched or torn (strained). Ligaments are tissues that connect bones to each other. Lifting something improperly can cause a back strain.  Wear and tear (degeneration) of the spinal disks. Spinal disks are circular tissue that provides cushioning between the bones of the spine (vertebrae).  Twisting motions, such as while playing sports or doing yard work.  A hit to the back.  Arthritis. You may have a physical exam, lab tests, and imaging tests to find the cause of your pain. Acute back pain usually goes away with rest and home care. Follow these instructions at home: Managing pain, stiffness, and swelling  Take over-the-counter and prescription medicines only as told by your health care provider.  Your health care provider may recommend applying ice during the first 24-48 hours after your pain starts. To do this: ? Put ice in a plastic bag. ? Place a towel between your skin and the bag. ? Leave the ice on for 20 minutes, 2-3 times a day.  If directed, apply heat to the affected area as often as told by your health care provider. Use the heat source that your health care provider recommends, such as a moist heat pack or a heating pad. ? Place a towel between your skin and the heat source. ? Leave the heat on for 20-30 minutes. ? Remove the heat if your skin turns bright red. This is especially important if you are unable to feel pain, heat, or cold. You have a greater risk of getting burned. Activity   Do not stay in bed. Staying in bed for more than 1-2 days can delay your recovery.  Sit up and stand up straight. Avoid leaning forward when you sit, or hunching over when you stand. ? If you work at a desk, sit close to it so you do not need to lean over. Keep your chin tucked  in. Keep your neck drawn back, and keep your elbows bent at a right angle. Your arms should look like the letter "L." ? Sit high and close to the steering wheel when you drive. Add lower back (lumbar) support to your car seat, if needed.  Take short walks on even surfaces as soon as you are able. Try to increase the length of time you walk each day.  Do not sit, drive, or stand in one place for more than 30 minutes at a time. Sitting or standing for long periods of time can put stress on your back.  Do not drive or use heavy machinery while taking prescription pain medicine.  Use proper lifting techniques. When you bend and lift, use positions that put less stress on your back: ? Bend your knees. ? Keep the load close to your body. ? Avoid twisting.  Exercise regularly as told by your health care provider. Exercising helps your back heal faster and helps prevent back injuries by keeping muscles strong and flexible.  Work with a physical therapist to make a safe exercise program, as recommended by your health care provider. Do any exercises as told by your physical therapist. Lifestyle  Maintain a healthy weight. Extra weight puts stress on your back and makes it difficult to have good posture.  Avoid activities or situations that make you feel anxious or stressed. Stress and anxiety increase muscle   tension and can make back pain worse. Learn ways to manage anxiety and stress, such as through exercise. General instructions  Sleep on a firm mattress in a comfortable position. Try lying on your side with your knees slightly bent. If you lie on your back, put a pillow under your knees.  Follow your treatment plan as told by your health care provider. This may include: ? Cognitive or behavioral therapy. ? Acupuncture or massage therapy. ? Meditation or yoga. Contact a health care provider if:  You have pain that is not relieved with rest or medicine.  You have increasing pain going down  into your legs or buttocks.  Your pain does not improve after 2 weeks.  You have pain at night.  You lose weight without trying.  You have a fever or chills. Get help right away if:  You develop new bowel or bladder control problems.  You have unusual weakness or numbness in your arms or legs.  You develop nausea or vomiting.  You develop abdominal pain.  You feel faint. Summary  Acute back pain is sudden and usually short-lived.  Use proper lifting techniques. When you bend and lift, use positions that put less stress on your back.  Take over-the-counter and prescription medicines and apply heat or ice as directed by your health care provider. This information is not intended to replace advice given to you by your health care provider. Make sure you discuss any questions you have with your health care provider. Document Revised: 10/07/2018 Document Reviewed: 01/30/2017 [employer] Patient Education  2020 [employer]  

## 2020-01-20 NOTE — Progress Notes (Signed)
Subjective:  Patient ID: Adam Vaughn, male    DOB: [birth year]  Age: 44 y.o. MRN: [id number]  CC: Back Pain  This visit occurred during the SARS-CoV-2 public health emergency.  Safety protocols were in place, including screening questions prior to the visit, additional usage of staff PPE, and extensive cleaning of exam room while observing appropriate contact time as indicated for disinfecting solutions.    HPI Adam Vaughn presents for a 3-day history of low back pain.  He tells me that he had an MRI done 10 years ago that was positive for bulging disc and degenerative disc disease.  Someone else ordered a plain film on his low back about a week ago.  It was abnormal.  He tells me the back pain is a combination of a stabbing sensation with throbbing, burning, aching, and radiating into his left buttock.  He also complains of weakness in both lower extremities.  The pain increases with movement.  He denies numbness or tingling.  He has not gotten much symptom relief with Celebrex and ibuprofen.  Outpatient Medications Prior to Visit  Medication Sig Dispense Refill  . Cholecalciferol (VITAMIN D3) 1.25 MG (50000 UT) CAPS Take 1 capsule by mouth once a week. 8 capsule 0  . famotidine (PEPCID) 40 MG tablet Take 1 tablet (40 mg total) by mouth daily. 90 tablet 3  . ibuprofen (ADVIL,MOTRIN) 600 MG tablet Take 1 tablet (600 mg total) by mouth every 6 (six) hours as needed. 30 tablet 0  . tadalafil (CIALIS) 20 MG tablet Take 1 tablet (20 mg total) by mouth daily as needed for erectile dysfunction. 10 tablet 0  . celecoxib (CELEBREX) 200 MG capsule Take 1 capsule (200 mg total) by mouth daily. 90 capsule 1  . b complex vitamins tablet Take 1 tablet by mouth daily. 100 tablet 3  . Cholecalciferol (VITAMIN D3) 50 MCG (2000 UT) capsule Take 1 capsule (2,000 Units total) by mouth daily. 100 capsule 3   No facility-administered medications prior to visit.    ROS Review of Systems   Constitutional: Negative.  Negative for chills, fatigue and fever.  HENT: Negative.   Eyes: Negative.   Respiratory: Negative for chest tightness, shortness of breath and wheezing.   Cardiovascular: Negative for chest pain, palpitations and leg swelling.  Gastrointestinal: Negative for abdominal pain, constipation, diarrhea and vomiting.  Endocrine: Negative.   Genitourinary: Negative.  Negative for difficulty urinating.  Musculoskeletal: Positive for back pain.  Skin: Negative.   Neurological: Positive for weakness. Negative for dizziness and numbness.  Hematological: Negative for adenopathy. Does not bruise/bleed easily.  Psychiatric/Behavioral: Negative.     Objective:  BP 140/80 (BP Location: Left Arm, Patient Position: Sitting, Cuff Size: Normal)   Pulse 94   Temp 98.7 F (37.1 C) (Oral)   Resp 16   Ht 5\' 8"  (1.727 m)   Wt 218 lb (98.9 kg)   SpO2 97%   BMI 33.15 kg/m   BP Readings from Last 3 Encounters:  01/20/20 140/80  01/12/20 140/88  07/21/19 124/88    Wt Readings from Last 3 Encounters:  01/20/20 218 lb (98.9 kg)  01/12/20 215 lb (97.5 kg)    Physical Exam Vitals reviewed.  Constitutional:      Appearance: Normal appearance.  HENT:     Nose: Nose normal.     Mouth/Throat:     Mouth: Mucous membranes are moist.  Eyes:     General: No scleral icterus.    Conjunctiva/sclera: Conjunctivae normal.  Cardiovascular:  Rate and Rhythm: Normal rate and regular rhythm.     Heart sounds: No murmur heard.   Pulmonary:     Effort: Pulmonary effort is normal.     Breath sounds: No stridor. No wheezing, rhonchi or rales.  Abdominal:     General: Abdomen is flat.     Palpations: There is no mass.     Tenderness: There is no abdominal tenderness. There is no guarding.  Musculoskeletal:     Cervical back: Neck supple.     Lumbar back: No tenderness or bony tenderness. Decreased range of motion. Positive right straight leg raise test and positive left  straight leg raise test.     Right lower leg: No edema.     Left lower leg: No edema.  Lymphadenopathy:     Cervical: No cervical adenopathy.  Skin:    General: Skin is warm and dry.     Findings: No rash.  Neurological:     General: No focal deficit present.     Mental Status: He is alert.     Sensory: No sensory deficit.     Motor: Weakness present.     Coordination: Coordination is intact. Coordination normal.     Gait: Gait normal.     Deep Tendon Reflexes: Reflexes normal.     Reflex Scores:      Tricep reflexes are 1+ on the right side and 1+ on the left side.      Bicep reflexes are 1+ on the right side and 1+ on the left side.      Brachioradialis reflexes are 1+ on the right side and 1+ on the left side.      Patellar reflexes are 1+ on the right side and 1+ on the left side.      Achilles reflexes are 1+ on the right side and 1+ on the left side. Psychiatric:        Mood and Affect: Mood normal.        Behavior: Behavior normal.     Lab Results  Component Value Date   WBC 7.0 01/14/2020   HGB 15.0 01/14/2020   HCT 43.2 01/14/2020   PLT 237.0 01/14/2020   GLUCOSE 115 (H) 01/14/2020   ALT 54 (H) 01/14/2020   AST 23 01/14/2020   NA 141 01/14/2020   K 4.3 01/14/2020   CL 105 01/14/2020   CREATININE 0.96 01/14/2020   BUN 14 01/14/2020   CO2 27 01/14/2020   TSH 1.26 01/14/2020    DG Lumbar Spine 2-3 Views  Result Date: 01/14/2020 CLINICAL DATA:  Chronic low back pain and left leg pain, no recent injury EXAM: LUMBAR SPINE - 2-3 VIEW COMPARISON:  10/28/2008 FINDINGS: No fracture or dislocation of the lumbar spine. There is mild multilevel disc space height loss and osteophytosis of the lower lumbar spine, worst at L4-L5 and L5-S1. Mild multilevel facet degenerative change. Degenerative findings are slightly worsened in comparison to prior radiographs dated 2010. Nonobstructive pattern of overlying bowel gas. Probable left renal calculi. IMPRESSION: 1. No fracture or  dislocation of the lumbar spine. There is mild multilevel disc space height loss and osteophytosis of the lower lumbar spine, worst at L4-L5 and L5-S1. Mild multilevel facet degenerative change. Degenerative findings are worsened compared to prior radiographs dated 2010. Lumbar disc and neural foraminal pathology may be further evaluated by MRI if indicated by neurologically localizing signs and symptoms. 2.  Probable left renal calculi. Electronically Signed   By: Dorna Bloom.D.  On: 01/14/2020 16:50    Assessment & Plan:   Maurie was seen today for back pain.  Diagnoses and all orders for this visit:  Left lumbar radiculitis- He has low back pain with radicular symptoms but on examination he is neurologically intact.  He recently had abnormal lumbar plain films.  I recommended that he undergo an MRI to see if he has spinal stenosis, nerve impingement, or disc herniation that we would be amenable to surgical intervention.  I recommend that he consolidate to 1 anti-inflammatory and to control the pain and inflammation with a Medrol Dosepak, oxycodone, acetaminophen, and a muscle relaxer. -     MR Lumbar Spine Wo Contrast; Future -     oxyCODONE-acetaminophen (PERCOCET) 7.5-325 MG tablet; Take 1 tablet by mouth every 8 (eight) hours as needed for severe pain. -     methylPREDNISolone (MEDROL DOSEPAK) 4 MG TBPK tablet; TAKE AS DIRECTED -     tizanidine (ZANAFLEX) 2 MG capsule; Take 1 capsule (2 mg total) by mouth 3 (three) times daily as needed for muscle spasms.  DDD (degenerative disc disease), lumbar- Eee above. -     MR Lumbar Spine Wo Contrast; Future -     oxyCODONE-acetaminophen (PERCOCET) 7.5-325 MG tablet; Take 1 tablet by mouth every 8 (eight) hours as needed for severe pain. -     methylPREDNISolone (MEDROL DOSEPAK) 4 MG TBPK tablet; TAKE AS DIRECTED -     tizanidine (ZANAFLEX) 2 MG capsule; Take 1 capsule (2 mg total) by mouth 3 (three) times daily as needed for muscle spasms.   I  have discontinued Conner Lagman's celecoxib and b complex vitamins. I am also having him start on oxyCODONE-acetaminophen, methylPREDNISolone, and tizanidine. Additionally, I am having him maintain his ibuprofen, tadalafil, famotidine, and Vitamin D3.  Meds ordered this encounter  Medications  . oxyCODONE-acetaminophen (PERCOCET) 7.5-325 MG tablet    Sig: Take 1 tablet by mouth every 8 (eight) hours as needed for severe pain.    Dispense:  25 tablet    Refill:  0  . methylPREDNISolone (MEDROL DOSEPAK) 4 MG TBPK tablet    Sig: TAKE AS DIRECTED    Dispense:  21 tablet    Refill:  0  . tizanidine (ZANAFLEX) 2 MG capsule    Sig: Take 1 capsule (2 mg total) by mouth 3 (three) times daily as needed for muscle spasms.    Dispense:  90 capsule    Refill:  1     Follow-up: Return in about 3 weeks (around 02/10/2020).  Scarlette Calico, MD

## 2020-02-13 ENCOUNTER — Ambulatory Visit
Admission: RE | Admit: 2020-02-13 | Discharge: 2020-02-13 | Disposition: A | Discharge status: Home / Self Care | Payer: Managed Care, Other (non HMO) | Source: Ambulatory Visit | Attending: Internal Medicine | Admitting: Internal Medicine

## 2020-02-13 DIAGNOSIS — M5136 Other intervertebral disc degeneration, lumbar region: Secondary | ICD-10-CM

## 2020-02-13 DIAGNOSIS — M5416 Radiculopathy, lumbar region: Secondary | ICD-10-CM

## 2020-02-14 ENCOUNTER — Other Ambulatory Visit: Payer: Self-pay | Admitting: Internal Medicine

## 2020-02-14 ENCOUNTER — Encounter: Payer: Self-pay | Admitting: Internal Medicine

## 2020-02-14 DIAGNOSIS — M48062 Spinal stenosis, lumbar region with neurogenic claudication: Secondary | ICD-10-CM | POA: Insufficient documentation

## 2020-02-24 ENCOUNTER — Other Ambulatory Visit: Payer: Self-pay | Admitting: Neurosurgery

## 2020-02-25 ENCOUNTER — Other Ambulatory Visit: Payer: Self-pay

## 2020-02-25 ENCOUNTER — Ambulatory Visit (INDEPENDENT_AMBULATORY_CARE_PROVIDER_SITE_OTHER): Payer: Managed Care, Other (non HMO) | Admitting: Internal Medicine

## 2020-02-25 ENCOUNTER — Encounter: Payer: Self-pay | Admitting: Internal Medicine

## 2020-02-25 DIAGNOSIS — E559 Vitamin D deficiency, unspecified: Secondary | ICD-10-CM

## 2020-02-25 DIAGNOSIS — M51369 Other intervertebral disc degeneration, lumbar region without mention of lumbar back pain or lower extremity pain: Secondary | ICD-10-CM

## 2020-02-25 DIAGNOSIS — G8929 Other chronic pain: Secondary | ICD-10-CM

## 2020-02-25 DIAGNOSIS — M5136 Other intervertebral disc degeneration, lumbar region: Secondary | ICD-10-CM | POA: Diagnosis not present

## 2020-02-25 DIAGNOSIS — M545 Low back pain, unspecified: Secondary | ICD-10-CM

## 2020-02-25 DIAGNOSIS — M5416 Radiculopathy, lumbar region: Secondary | ICD-10-CM | POA: Diagnosis not present

## 2020-02-25 MED ORDER — TIZANIDINE HCL 4 MG PO TABS: 4.0000 mg | ORAL_TABLET | Freq: Four times a day (QID) | ORAL | 3 refills | Status: DC | PRN

## 2020-02-25 MED ORDER — ESOMEPRAZOLE MAGNESIUM 40 MG PO CPDR: 40.0000 mg | DELAYED_RELEASE_CAPSULE | Freq: Every day | ORAL | 3 refills | Status: DC

## 2020-02-25 MED ORDER — OXYCODONE-ACETAMINOPHEN 7.5-325 MG PO TABS
1.0000 | ORAL_TABLET | Freq: Three times a day (TID) | ORAL | 0 refills | Status: DC | PRN
Start: 2020-02-25 — End: 2020-04-13

## 2020-02-25 MED ORDER — GABAPENTIN 300 MG PO CAPS: 300.0000 mg | ORAL_CAPSULE | Freq: Two times a day (BID) | ORAL | 3 refills | Status: DC

## 2020-02-25 NOTE — Progress Notes (Signed)
Subjective:  Patient ID: Adam Vaughn, male    DOB: 1976-01-18  Age: 44 y.o. MRN: [id number]  CC: No chief complaint on file.   HPI [name] presents for severe LBP. He saw Dr [name] - fusion is planned in Oct Lost wt on diet Unable to work; out of work x 5 wks Pain is 6/10 on meds  Outpatient Medications Prior to Visit  Medication Sig Dispense Refill  . Cholecalciferol (VITAMIN D3) 1.25 MG (50000 UT) CAPS Take 1 capsule by mouth once a week. 8 capsule 0  . famotidine (PEPCID) 40 MG tablet Take 1 tablet (40 mg total) by mouth daily. 90 tablet 3  . ibuprofen (ADVIL,MOTRIN) 600 MG tablet Take 1 tablet (600 mg total) by mouth every 6 (six) hours as needed. 30 tablet 0  . oxyCODONE-acetaminophen (PERCOCET) 7.5-325 MG tablet Take 1 tablet by mouth every 8 (eight) hours as needed for severe pain. 25 tablet 0  . methylPREDNISolone (MEDROL DOSEPAK) 4 MG TBPK tablet TAKE AS DIRECTED (Patient not taking: Reported on 02/25/2020) 21 tablet 0  . tadalafil (CIALIS) 20 MG tablet Take 1 tablet (20 mg total) by mouth daily as needed for erectile dysfunction. 10 tablet 0  . tizanidine (ZANAFLEX) 2 MG capsule Take 1 capsule (2 mg total) by mouth 3 (three) times daily as needed for muscle spasms. (Patient not taking: Reported on 02/25/2020) 90 capsule 1   No facility-administered medications prior to visit.    ROS: Review of Systems  Constitutional: Negative for appetite change, fatigue and unexpected weight change.  HENT: Negative for congestion, nosebleeds, sneezing, sore throat and trouble swallowing.   Eyes: Negative for itching and visual disturbance.  Respiratory: Negative for cough.   Cardiovascular: Negative for chest pain, palpitations and leg swelling.  Gastrointestinal: Negative for abdominal distention, blood in stool, diarrhea and nausea.  Genitourinary: Negative for frequency and hematuria.  Musculoskeletal: Positive for back pain and gait problem. Negative for joint swelling  and neck pain.  Skin: Negative for rash.  Neurological: Negative for dizziness, tremors, speech difficulty and weakness.  Psychiatric/Behavioral: Negative for agitation, dysphoric mood, sleep disturbance and suicidal ideas. The patient is not nervous/anxious.     Objective:  BP 128/80 (BP Location: Left Arm, Patient Position: Sitting, Cuff Size: Large)   Pulse 86   Temp 98.5 F (36.9 C) (Oral)   Ht 5\' 8"  (1.727 m)   Wt 210 lb (95.3 kg)   SpO2 99%   BMI 31.93 kg/m   BP Readings from Last 3 Encounters:  02/25/20 128/80  01/20/20 140/80  01/12/20 140/88    Wt Readings from Last 3 Encounters:  02/25/20 210 lb (95.3 kg)  01/20/20 218 lb (98.9 kg)  01/12/20 215 lb (97.5 kg)    Physical Exam Constitutional:      General: He is not in acute distress.    Appearance: He is well-developed.     Comments: NAD  Eyes:     Conjunctiva/sclera: Conjunctivae normal.     Pupils: Pupils are equal, round, and reactive to light.  Neck:     Thyroid: No thyromegaly.     Vascular: No JVD.  Cardiovascular:     Rate and Rhythm: Normal rate and regular rhythm.     Heart sounds: Normal heart sounds. No murmur heard.  No friction rub. No gallop.   Pulmonary:     Effort: Pulmonary effort is normal. No respiratory distress.     Breath sounds: Normal breath sounds. No wheezing or rales.  Chest:  Chest wall: No tenderness.  Abdominal:     General: Bowel sounds are normal. There is no distension.     Palpations: Abdomen is soft. There is no mass.     Tenderness: There is no abdominal tenderness. There is no guarding or rebound.  Musculoskeletal:        General: Tenderness present. Normal range of motion.     Cervical back: Normal range of motion.  Lymphadenopathy:     Cervical: No cervical adenopathy.  Skin:    General: Skin is warm and dry.     Findings: No rash.  Neurological:     Mental Status: He is alert and oriented to person, place, and time.     Cranial Nerves: No cranial nerve  deficit.     Motor: No abnormal muscle tone.     Coordination: Coordination normal.     Gait: Gait normal.     Deep Tendon Reflexes: Reflexes are normal and symmetric.  Psychiatric:        Behavior: Behavior normal.        Thought Content: Thought content normal.        Judgment: Judgment normal.     Lab Results  Component Value Date   WBC 7.0 01/14/2020   HGB 15.0 01/14/2020   HCT 43.2 01/14/2020   PLT 237.0 01/14/2020   GLUCOSE 115 (H) 01/14/2020   ALT 54 (H) 01/14/2020   AST 23 01/14/2020   NA 141 01/14/2020   K 4.3 01/14/2020   CL 105 01/14/2020   CREATININE 0.96 01/14/2020   BUN 14 01/14/2020   CO2 27 01/14/2020   TSH 1.26 01/14/2020    MR Lumbar Spine Wo Contrast  Result Date: 02/14/2020 CLINICAL DATA:  Initial evaluation for chronic lower back pain with bilateral lower extremity pain, worsening. EXAM: MRI LUMBAR SPINE WITHOUT CONTRAST TECHNIQUE: Multiplanar, multisequence MR imaging of the lumbar spine was performed. No intravenous contrast was administered. COMPARISON:  Prior radiograph from 01/14/2020 and MRI from 11/04/2008. FINDINGS: Segmentation: Standard. Lowest well-formed disc space labeled the L5-S1 level. Alignment: Straightening of the normal lumbar lordosis. Trace retrolisthesis of L3 on L4, chronic and facet mediated. Vertebrae: Vertebral body height maintained without evidence for acute or chronic fracture. Bone marrow signal intensity within normal limits. Multiple scattered benign hemangiomata noted. No worrisome osseous lesions. No abnormal marrow edema. Conus medullaris and cauda equina: Conus extends to the L1 level. Conus and cauda equina appear normal. Paraspinal and other soft tissues: Paraspinous soft tissues within normal limits. Visualized visceral structures are normal. Disc levels: L1-2:  Unremarkable. L2-3: Mild diffuse disc bulge with disc desiccation. Mild bilateral facet hypertrophy. No significant spinal stenosis. Foramina remain patent. L3-4:  Mild diffuse disc bulge with disc desiccation and intervertebral disc space narrowing. Disc bulging slightly asymmetric to the right. Mild bilateral facet hypertrophy with underlying short pedicles. No significant spinal stenosis. Mild to moderate bilateral L3 foraminal stenosis. L4-5: Chronic intervertebral disc space narrowing with diffuse disc bulge and disc desiccation. Mild reactive endplate spurring. Superimposed moderate right worse than left facet hypertrophy. Changes superimposed on short pedicles resultant moderate canal with moderate bilateral lateral recess stenosis. Mild to moderate bilateral L3 foraminal narrowing. L5-S1: Mild diffuse disc bulge with disc desiccation and intervertebral disc space narrowing. Superimposed central disc protrusion with slight caudad angulation and annular fissure. Protruding disc encroaches upon the lateral recesses bilaterally, contacting both of the descending S1 nerve roots, greater on the left (series 5, image 41). Superimposed mild facet hypertrophy. Resultant moderate bilateral lateral  recess stenosis. Central canal remains patent. Mild to moderate bilateral L5 foraminal narrowing. IMPRESSION: 1. Central disc protrusion at L5-S1, contacting both of the descending S1 nerve roots as they course through the lateral recesses. 2. Disc bulging with facet hypertrophy at L4-5 with resultant moderate canal and bilateral lateral recess stenosis. 3. Mild to moderate bilateral L3 through L5 foraminal stenosis related to disc bulge, reactive endplate changes, and facet hypertrophy. Electronically Signed   By: Jeannine Boga M.D.   On: 02/14/2020 03:53    Assessment & Plan:    Walker Kehr, MD

## 2020-02-25 NOTE — Assessment & Plan Note (Signed)
Vit D 

## 2020-04-06 NOTE — Progress Notes (Signed)
[facility] ([address]), [place] [facility] [place] [address] [place] ([place]) [place] [address] Phone: [phone] Fax: [phone]      Your procedure is scheduled on Monday October 11th.  Report to [facility] Main Entrance "A" at 6:00 A.M., and check in at the Admitting office.  Call this number if you have problems the morning of surgery:  [phone]  Call [phone] if you have any questions prior to your surgery date Monday-Friday 8am-4pm    Remember:  Do not eat or drink after midnight the night before your surgery    Take these medicines the morning of surgery with A SIP OF WATER   esomeprazole (NEXIUM) 40 MG capsule  gabapentin (NEURONTIN) 300 MG capsule    IF NEEDED  acetaminophen (TYLENOL) 500 MG tablet  oxyCODONE-acetaminophen (PERCOCET) 7.5-325 MG tablet  tiZANidine (ZANAFLEX) 4 MG tablet   As of today, STOP taking any Aspirin (unless otherwise instructed by your surgeon) Aleve, Naproxen, Ibuprofen, Motrin, Advil, Goody's, BC's, all herbal medications, fish oil, and all vitamins.                      Do not wear jewelry            Do not wear lotions, powders, colognes, or deodorant.            Do not shave 48 hours prior to surgery.  Men may shave face and neck.            Do not bring valuables to the hospital.            [facility] is not responsible for any belongings or valuables.  Do NOT Smoke (Tobacco/Vaping) or drink Alcohol 24 hours prior to your procedure If you use a CPAP at night, you may bring all equipment for your overnight stay.   Contacts, glasses, dentures or bridgework may not be worn into surgery.      For patients admitted to the hospital, discharge time will be determined by your treatment team.   Patients discharged the day of surgery will not be allowed to drive home, and someone needs to stay with them for 24 hours.    Special instructions:   [place]- Preparing For Surgery  Before surgery, you can  play an important role. Because skin is not sterile, your skin needs to be as free of germs as possible. You can reduce the number of germs on your skin by washing with CHG (chlorahexidine gluconate) Soap before surgery.  CHG is an antiseptic cleaner which kills germs and bonds with the skin to continue killing germs even after washing.    Oral Hygiene is also important to reduce your risk of infection.  Remember - BRUSH YOUR TEETH THE MORNING OF SURGERY WITH YOUR REGULAR TOOTHPASTE  Please do not use if you have an allergy to CHG or antibacterial soaps. If your skin becomes reddened/irritated stop using the CHG.  Do not shave (including legs and underarms) for at least 48 hours prior to first CHG shower. It is OK to shave your face.  Please follow these instructions carefully.   1. Shower the NIGHT BEFORE SURGERY and the MORNING OF SURGERY with CHG Soap.   2. If you chose to wash your hair, wash your hair first as usual with your normal shampoo.  3. After you shampoo, rinse your hair and body thoroughly to remove the shampoo.  4. Use CHG as you would any other  liquid soap. You can apply CHG directly to the skin and wash gently with a scrungie or a clean washcloth.   5. Apply the CHG Soap to your body ONLY FROM THE NECK DOWN.  Do not use on open wounds or open sores. Avoid contact with your eyes, ears, mouth and genitals (private parts). Wash Face and genitals (private parts)  with your normal soap.   6. Wash thoroughly, paying special attention to the area where your surgery will be performed.  7. Thoroughly rinse your body with warm water from the neck down.  8. DO NOT shower/wash with your normal soap after using and rinsing off the CHG Soap.  9. Pat yourself dry with a CLEAN TOWEL.  10. Wear CLEAN PAJAMAS to bed the night before surgery  11. Place CLEAN SHEETS on your bed the night of your first shower and DO NOT SLEEP WITH PETS.   Day of Surgery: Wear Clean/Comfortable clothing  the morning of surgery Do not apply any deodorants/lotions.   Remember to brush your teeth WITH YOUR REGULAR TOOTHPASTE.   Please read over the following fact sheets that you were given.

## 2020-04-07 ENCOUNTER — Encounter (HOSPITAL_COMMUNITY): Payer: Self-pay

## 2020-04-07 ENCOUNTER — Encounter (HOSPITAL_COMMUNITY)
Admission: RE | Admit: 2020-04-07 | Discharge: 2020-04-07 | Disposition: A | Discharge status: Home / Self Care | Payer: 59 | Source: Ambulatory Visit | Attending: Neurosurgery | Admitting: Neurosurgery

## 2020-04-07 ENCOUNTER — Other Ambulatory Visit: Payer: Self-pay

## 2020-04-07 ENCOUNTER — Other Ambulatory Visit (HOSPITAL_COMMUNITY)
Admission: RE | Admit: 2020-04-07 | Discharge: 2020-04-07 | Disposition: A | Discharge status: Home / Self Care | Payer: 59 | Source: Ambulatory Visit | Attending: Neurosurgery | Admitting: Neurosurgery

## 2020-04-07 DIAGNOSIS — Z20822 Contact with and (suspected) exposure to covid-19: Secondary | ICD-10-CM | POA: Insufficient documentation

## 2020-04-07 DIAGNOSIS — I10 Essential (primary) hypertension: Secondary | ICD-10-CM | POA: Insufficient documentation

## 2020-04-07 DIAGNOSIS — Z01818 Encounter for other preprocedural examination: Secondary | ICD-10-CM | POA: Insufficient documentation

## 2020-04-07 DIAGNOSIS — Z01812 Encounter for preprocedural laboratory examination: Secondary | ICD-10-CM | POA: Insufficient documentation

## 2020-04-07 HISTORY — DX: Unspecified osteoarthritis, unspecified site: M19.90

## 2020-04-07 HISTORY — DX: Personal history of urinary calculi: Z87.442

## 2020-04-07 HISTORY — DX: Gastro-esophageal reflux disease without esophagitis: K21.9

## 2020-04-07 LAB — BASIC METABOLIC PANEL
Anion gap: 9 (ref 5–15)
BUN: 14 mg/dL (ref 6–20)
CO2: 21 mmol/L — ABNORMAL LOW (ref 22–32)
Calcium: 9.2 mg/dL (ref 8.9–10.3)
Chloride: 107 mmol/L (ref 98–111)
Creatinine, Ser: 0.92 mg/dL (ref 0.61–1.24)
GFR calc non Af Amer: >60 mL/min (ref >60)
Glucose, Bld: 103 mg/dL — ABNORMAL HIGH (ref 70–99)
Potassium: 3.9 mmol/L (ref 3.5–5.1)
Sodium: 137 mmol/L (ref 135–145)

## 2020-04-07 LAB — SURGICAL PCR SCREEN
MRSA, PCR: NEGATIVE
Staphylococcus aureus: POSITIVE — AB

## 2020-04-07 LAB — CBC WITH DIFFERENTIAL/PLATELET
Abs Immature Granulocytes: 0.08 10*3/uL — ABNORMAL HIGH (ref 0.00–0.07)
Basophils Absolute: 0 10*3/uL (ref 0.0–0.1)
Basophils Relative: 1 %
Eosinophils Absolute: 0.1 10*3/uL (ref 0.0–0.5)
Eosinophils Relative: 1 %
HCT: 43 % (ref 39.0–52.0)
Hemoglobin: 14.7 g/dL (ref 13.0–17.0)
Immature Granulocytes: 1 %
Lymphocytes Relative: 24 %
Lymphs Abs: 1.8 10*3/uL (ref 0.7–4.0)
MCH: 29.4 pg (ref 26.0–34.0)
MCHC: 34.2 g/dL (ref 30.0–36.0)
MCV: 86 fL (ref 80.0–100.0)
Monocytes Absolute: 0.8 10*3/uL (ref 0.1–1.0)
Monocytes Relative: 11 %
Neutro Abs: 4.6 10*3/uL (ref 1.7–7.7)
Neutrophils Relative %: 62 %
Platelets: 258 10*3/uL (ref 150–400)
RBC: 5 MIL/uL (ref 4.22–5.81)
RDW: 12 % (ref 11.5–15.5)
WBC: 7.4 10*3/uL (ref 4.0–10.5)
nRBC: 0 % (ref 0.0–0.2)

## 2020-04-07 LAB — SARS CORONAVIRUS 2 (TAT 6-24 HRS): SARS Coronavirus 2: NEGATIVE

## 2020-04-07 LAB — TYPE AND SCREEN
ABO/RH(D): O POS
Antibody Screen: NEGATIVE

## 2020-04-07 NOTE — Progress Notes (Signed)
PCP: Dr. Alain Marion Cardiologist: Denies  EKG: Today--hx htn, not on any meds CXR: n/a ECHO: denies Stress Test: denies Cardiac Cath: denies  Going for Covid testing today, aware to self quarantine.   Patient denies shortness of breath, fever, cough, and chest pain at PAT appointment.  Patient verbalized understanding of instructions provided today at the PAT appointment.  Patient asked to review instructions at home and day of surgery.

## 2020-04-11 ENCOUNTER — Inpatient Hospital Stay (HOSPITAL_COMMUNITY)
Admission: RE | Admit: 2020-04-11 | Discharge: 2020-04-13 | DRG: 455 | Disposition: A | Discharge status: Home / Self Care | Payer: 59 | Attending: Neurosurgery | Admitting: Neurosurgery

## 2020-04-11 ENCOUNTER — Inpatient Hospital Stay (HOSPITAL_COMMUNITY): Payer: 59 | Admitting: Anesthesiology

## 2020-04-11 ENCOUNTER — Other Ambulatory Visit: Payer: Self-pay

## 2020-04-11 ENCOUNTER — Encounter (HOSPITAL_COMMUNITY): Payer: Self-pay | Admitting: Neurosurgery

## 2020-04-11 ENCOUNTER — Inpatient Hospital Stay (HOSPITAL_COMMUNITY): Payer: 59

## 2020-04-11 ENCOUNTER — Encounter (HOSPITAL_COMMUNITY)
Admission: RE | Disposition: A | Discharge status: Home / Self Care | Payer: Self-pay | Source: Home / Self Care | Attending: Neurosurgery

## 2020-04-11 DIAGNOSIS — M5137 Other intervertebral disc degeneration, lumbosacral region: Secondary | ICD-10-CM | POA: Diagnosis present

## 2020-04-11 DIAGNOSIS — M5136 Other intervertebral disc degeneration, lumbar region: Secondary | ICD-10-CM | POA: Diagnosis present

## 2020-04-11 DIAGNOSIS — G8929 Other chronic pain: Secondary | ICD-10-CM | POA: Diagnosis present

## 2020-04-11 DIAGNOSIS — K219 Gastro-esophageal reflux disease without esophagitis: Secondary | ICD-10-CM | POA: Diagnosis present

## 2020-04-11 DIAGNOSIS — Z791 Long term (current) use of non-steroidal anti-inflammatories (NSAID): Secondary | ICD-10-CM

## 2020-04-11 DIAGNOSIS — M48061 Spinal stenosis, lumbar region without neurogenic claudication: Secondary | ICD-10-CM | POA: Diagnosis present

## 2020-04-11 DIAGNOSIS — Z79899 Other long term (current) drug therapy: Secondary | ICD-10-CM | POA: Diagnosis not present

## 2020-04-11 DIAGNOSIS — Z833 Family history of diabetes mellitus: Secondary | ICD-10-CM | POA: Diagnosis not present

## 2020-04-11 DIAGNOSIS — Z8249 Family history of ischemic heart disease and other diseases of the circulatory system: Secondary | ICD-10-CM | POA: Diagnosis not present

## 2020-04-11 DIAGNOSIS — M4316 Spondylolisthesis, lumbar region: Secondary | ICD-10-CM | POA: Diagnosis present

## 2020-04-11 DIAGNOSIS — I10 Essential (primary) hypertension: Secondary | ICD-10-CM | POA: Diagnosis present

## 2020-04-11 DIAGNOSIS — Z419 Encounter for procedure for purposes other than remedying health state, unspecified: Secondary | ICD-10-CM

## 2020-04-11 DIAGNOSIS — Z87442 Personal history of urinary calculi: Secondary | ICD-10-CM | POA: Diagnosis not present

## 2020-04-11 DIAGNOSIS — M4807 Spinal stenosis, lumbosacral region: Principal | ICD-10-CM | POA: Diagnosis present

## 2020-04-11 DIAGNOSIS — M1990 Unspecified osteoarthritis, unspecified site: Secondary | ICD-10-CM | POA: Diagnosis present

## 2020-04-11 DIAGNOSIS — M4310 Spondylolisthesis, site unspecified: Secondary | ICD-10-CM | POA: Diagnosis present

## 2020-04-11 HISTORY — PX: BACK SURGERY: SHX140

## 2020-04-11 LAB — ABO/RH: ABO/RH(D): O POS

## 2020-04-11 SURGERY — POSTERIOR LUMBAR FUSION 2 LEVEL
Anesthesia: General | Site: Spine Lumbar

## 2020-04-11 MED ORDER — POLYETHYLENE GLYCOL 3350 17 G PO PACK: 17.0000 g | PACK | Freq: Every day | ORAL | Status: DC | PRN

## 2020-04-11 MED ORDER — ROCURONIUM BROMIDE 10 MG/ML (PF) SYRINGE
PREFILLED_SYRINGE | INTRAVENOUS | Status: DC | PRN
  Administered 2020-04-11: 40 mg via INTRAVENOUS
  Administered 2020-04-11: 60 mg via INTRAVENOUS

## 2020-04-11 MED ORDER — FENTANYL CITRATE (PF) 250 MCG/5ML IJ SOLN
INTRAMUSCULAR | Status: AC
  Filled 2020-04-11: qty 5

## 2020-04-11 MED ORDER — BUPIVACAINE HCL (PF) 0.25 % IJ SOLN
INTRAMUSCULAR | Status: AC
  Filled 2020-04-11: qty 30

## 2020-04-11 MED ORDER — SODIUM CHLORIDE 0.9% FLUSH: 3.0000 mL | INTRAVENOUS | Status: DC | PRN

## 2020-04-11 MED ORDER — FLEET ENEMA 7-19 GM/118ML RE ENEM: 1.0000 | ENEMA | Freq: Once | RECTAL | Status: DC | PRN

## 2020-04-11 MED ORDER — CHLORHEXIDINE GLUCONATE CLOTH 2 % EX PADS: 6.0000 | MEDICATED_PAD | Freq: Once | CUTANEOUS | Status: DC

## 2020-04-11 MED ORDER — OXYCODONE HCL 5 MG PO TABS
10.0000 mg | ORAL_TABLET | ORAL | Status: DC | PRN
  Administered 2020-04-11 – 2020-04-13 (×14): 10 mg via ORAL
  Filled 2020-04-11 (×14): qty 2

## 2020-04-11 MED ORDER — OXYCODONE HCL 5 MG PO TABS
ORAL_TABLET | ORAL | Status: AC
Start: 2020-04-11 — End: 2020-04-12
  Filled 2020-04-11: qty 1

## 2020-04-11 MED ORDER — DIAZEPAM 5 MG PO TABS
5.0000 mg | ORAL_TABLET | Freq: Four times a day (QID) | ORAL | Status: DC | PRN
  Administered 2020-04-11 – 2020-04-12 (×3): 5 mg via ORAL
  Administered 2020-04-12 (×2): 10 mg via ORAL
  Administered 2020-04-13: 5 mg via ORAL
  Filled 2020-04-11: qty 2
  Filled 2020-04-11: qty 1
  Filled 2020-04-11: qty 2
  Filled 2020-04-11 (×2): qty 1

## 2020-04-11 MED ORDER — LACTATED RINGERS IV SOLN: INTRAVENOUS | Status: DC

## 2020-04-11 MED ORDER — HYDROMORPHONE HCL 1 MG/ML IJ SOLN
INTRAMUSCULAR | Status: AC
Start: 2020-04-11 — End: 2020-04-12
  Filled 2020-04-11: qty 1

## 2020-04-11 MED ORDER — CHLORHEXIDINE GLUCONATE 0.12 % MT SOLN
15.0000 mL | Freq: Once | OROMUCOSAL | Status: AC
  Administered 2020-04-11: 15 mL via OROMUCOSAL
  Filled 2020-04-11: qty 15

## 2020-04-11 MED ORDER — ORAL CARE MOUTH RINSE: 15.0000 mL | Freq: Once | OROMUCOSAL | Status: AC

## 2020-04-11 MED ORDER — PROCHLORPERAZINE EDISYLATE 10 MG/2ML IJ SOLN
10.0000 mg | Freq: Four times a day (QID) | INTRAMUSCULAR | Status: DC | PRN
  Administered 2020-04-11: 10 mg via INTRAVENOUS

## 2020-04-11 MED ORDER — ONDANSETRON HCL 4 MG/2ML IJ SOLN
4.0000 mg | Freq: Once | INTRAMUSCULAR | Status: AC | PRN
  Administered 2020-04-11: 4 mg via INTRAVENOUS

## 2020-04-11 MED ORDER — EPHEDRINE 5 MG/ML INJ
INTRAVENOUS | Status: AC
  Filled 2020-04-11: qty 10

## 2020-04-11 MED ORDER — BISACODYL 10 MG RE SUPP: 10.0000 mg | Freq: Every day | RECTAL | Status: DC | PRN

## 2020-04-11 MED ORDER — LIDOCAINE 2% (20 MG/ML) 5 ML SYRINGE
INTRAMUSCULAR | Status: AC
  Filled 2020-04-11: qty 5

## 2020-04-11 MED ORDER — THROMBIN 20000 UNITS EX SOLR
CUTANEOUS | Status: AC
  Filled 2020-04-11: qty 20000

## 2020-04-11 MED ORDER — VITAMIN D (ERGOCALCIFEROL) 1.25 MG (50000 UNIT) PO CAPS: 50000.0000 [IU] | ORAL_CAPSULE | ORAL | Status: DC

## 2020-04-11 MED ORDER — ONDANSETRON HCL 4 MG/2ML IJ SOLN
INTRAMUSCULAR | Status: DC | PRN
  Administered 2020-04-11: 4 mg via INTRAVENOUS

## 2020-04-11 MED ORDER — GABAPENTIN 300 MG PO CAPS
300.0000 mg | ORAL_CAPSULE | Freq: Two times a day (BID) | ORAL | Status: DC
  Administered 2020-04-11 – 2020-04-13 (×5): 300 mg via ORAL
  Filled 2020-04-11 (×5): qty 1

## 2020-04-11 MED ORDER — HYDROXYZINE HCL 50 MG/ML IM SOLN
50.0000 mg | Freq: Four times a day (QID) | INTRAMUSCULAR | Status: DC | PRN
  Administered 2020-04-11: 50 mg via INTRAMUSCULAR
  Filled 2020-04-11: qty 1

## 2020-04-11 MED ORDER — CEFAZOLIN SODIUM-DEXTROSE 2-4 GM/100ML-% IV SOLN
2.0000 g | INTRAVENOUS | Status: AC
  Administered 2020-04-11: 2 g via INTRAVENOUS
  Filled 2020-04-11: qty 100

## 2020-04-11 MED ORDER — SUGAMMADEX SODIUM 200 MG/2ML IV SOLN
INTRAVENOUS | Status: DC | PRN
  Administered 2020-04-11: 200 mg via INTRAVENOUS

## 2020-04-11 MED ORDER — DIAZEPAM 5 MG PO TABS
ORAL_TABLET | ORAL | Status: AC
  Filled 2020-04-11: qty 1

## 2020-04-11 MED ORDER — EPHEDRINE SULFATE-NACL 50-0.9 MG/10ML-% IV SOSY
PREFILLED_SYRINGE | INTRAVENOUS | Status: DC | PRN
  Administered 2020-04-11: 5 mg via INTRAVENOUS

## 2020-04-11 MED ORDER — PANTOPRAZOLE SODIUM 40 MG PO TBEC
40.0000 mg | DELAYED_RELEASE_TABLET | Freq: Every day | ORAL | Status: DC
  Administered 2020-04-11 – 2020-04-13 (×3): 40 mg via ORAL
  Filled 2020-04-11 (×3): qty 1

## 2020-04-11 MED ORDER — ACETAMINOPHEN 325 MG PO TABS
650.0000 mg | ORAL_TABLET | ORAL | Status: DC | PRN
  Administered 2020-04-11 – 2020-04-13 (×5): 650 mg via ORAL
  Filled 2020-04-11 (×5): qty 2

## 2020-04-11 MED ORDER — THROMBIN 20000 UNITS EX SOLR
CUTANEOUS | Status: DC | PRN
  Administered 2020-04-11: 20 mL via TOPICAL

## 2020-04-11 MED ORDER — MIDAZOLAM HCL 2 MG/2ML IJ SOLN
INTRAMUSCULAR | Status: AC
  Filled 2020-04-11: qty 2

## 2020-04-11 MED ORDER — OXYMETAZOLINE HCL 0.05 % NA SOLN
1.0000 | Freq: Two times a day (BID) | NASAL | Status: DC | PRN
  Filled 2020-04-11: qty 30

## 2020-04-11 MED ORDER — CEFAZOLIN SODIUM-DEXTROSE 1-4 GM/50ML-% IV SOLN
1.0000 g | Freq: Three times a day (TID) | INTRAVENOUS | Status: AC
  Administered 2020-04-11 (×2): 1 g via INTRAVENOUS
  Filled 2020-04-11 (×2): qty 50

## 2020-04-11 MED ORDER — HYDROMORPHONE HCL 1 MG/ML IJ SOLN
0.2500 mg | INTRAMUSCULAR | Status: DC | PRN
  Administered 2020-04-11 (×4): 0.5 mg via INTRAVENOUS

## 2020-04-11 MED ORDER — ACETAMINOPHEN 160 MG/5ML PO SOLN: 325.0000 mg | ORAL | Status: DC | PRN

## 2020-04-11 MED ORDER — MENTHOL 3 MG MT LOZG: 1.0000 | LOZENGE | OROMUCOSAL | Status: DC | PRN

## 2020-04-11 MED ORDER — LIDOCAINE 2% (20 MG/ML) 5 ML SYRINGE
INTRAMUSCULAR | Status: DC | PRN
  Administered 2020-04-11: 80 mg via INTRAVENOUS

## 2020-04-11 MED ORDER — PROPOFOL 10 MG/ML IV BOLUS
INTRAVENOUS | Status: DC | PRN
  Administered 2020-04-11: 200 mg via INTRAVENOUS

## 2020-04-11 MED ORDER — MIDAZOLAM HCL 2 MG/2ML IJ SOLN
INTRAMUSCULAR | Status: DC | PRN
  Administered 2020-04-11: 2 mg via INTRAVENOUS

## 2020-04-11 MED ORDER — ACETAMINOPHEN 650 MG RE SUPP: 650.0000 mg | RECTAL | Status: DC | PRN

## 2020-04-11 MED ORDER — 0.9 % SODIUM CHLORIDE (POUR BTL) OPTIME
TOPICAL | Status: DC | PRN
  Administered 2020-04-11: 1000 mL

## 2020-04-11 MED ORDER — VANCOMYCIN HCL 1000 MG IV SOLR
INTRAVENOUS | Status: AC
  Filled 2020-04-11: qty 1000

## 2020-04-11 MED ORDER — PHENOL 1.4 % MT LIQD: 1.0000 | OROMUCOSAL | Status: DC | PRN

## 2020-04-11 MED ORDER — ROCURONIUM BROMIDE 10 MG/ML (PF) SYRINGE
PREFILLED_SYRINGE | INTRAVENOUS | Status: AC
  Filled 2020-04-11: qty 10

## 2020-04-11 MED ORDER — HYDROMORPHONE HCL 1 MG/ML IJ SOLN
INTRAMUSCULAR | Status: AC
  Filled 2020-04-11: qty 1

## 2020-04-11 MED ORDER — PROCHLORPERAZINE EDISYLATE 10 MG/2ML IJ SOLN
INTRAMUSCULAR | Status: AC
  Filled 2020-04-11: qty 2

## 2020-04-11 MED ORDER — HYDROMORPHONE HCL 1 MG/ML IJ SOLN: 1.0000 mg | INTRAMUSCULAR | Status: DC | PRN

## 2020-04-11 MED ORDER — BUPIVACAINE HCL (PF) 0.25 % IJ SOLN
INTRAMUSCULAR | Status: DC | PRN
  Administered 2020-04-11: 20 mL

## 2020-04-11 MED ORDER — ONDANSETRON HCL 4 MG/2ML IJ SOLN
INTRAMUSCULAR | Status: AC
  Filled 2020-04-11: qty 2

## 2020-04-11 MED ORDER — SODIUM CHLORIDE 0.9 % IV SOLN
250.0000 mL | INTRAVENOUS | Status: DC
  Administered 2020-04-11: 250 mL via INTRAVENOUS

## 2020-04-11 MED ORDER — FENTANYL CITRATE (PF) 250 MCG/5ML IJ SOLN
INTRAMUSCULAR | Status: DC | PRN
Start: 2020-04-11 — End: 2020-04-11
  Administered 2020-04-11: 100 ug via INTRAVENOUS
  Administered 2020-04-11 (×7): 50 ug via INTRAVENOUS

## 2020-04-11 MED ORDER — ACETAMINOPHEN 325 MG PO TABS: 325.0000 mg | ORAL_TABLET | ORAL | Status: DC | PRN

## 2020-04-11 MED ORDER — OXYCODONE HCL 5 MG/5ML PO SOLN: 5.0000 mg | Freq: Once | ORAL | Status: AC | PRN

## 2020-04-11 MED ORDER — PROPOFOL 10 MG/ML IV BOLUS
INTRAVENOUS | Status: AC
  Filled 2020-04-11: qty 20

## 2020-04-11 MED ORDER — DEXAMETHASONE SODIUM PHOSPHATE 10 MG/ML IJ SOLN
10.0000 mg | Freq: Once | INTRAMUSCULAR | Status: AC
  Administered 2020-04-11: 10 mg via INTRAVENOUS
  Filled 2020-04-11: qty 1

## 2020-04-11 MED ORDER — OXYCODONE HCL 5 MG PO TABS
5.0000 mg | ORAL_TABLET | Freq: Once | ORAL | Status: AC | PRN
  Administered 2020-04-11: 5 mg via ORAL

## 2020-04-11 MED ORDER — VITAMIN D3 1.25 MG (50000 UT) PO CAPS: 50000.0000 mg | ORAL_CAPSULE | ORAL | Status: DC

## 2020-04-11 MED ORDER — ALBUMIN HUMAN 5 % IV SOLN: INTRAVENOUS | Status: DC | PRN

## 2020-04-11 MED ORDER — ONDANSETRON HCL 4 MG/2ML IJ SOLN: 4.0000 mg | Freq: Four times a day (QID) | INTRAMUSCULAR | Status: DC | PRN

## 2020-04-11 MED ORDER — VANCOMYCIN HCL 1000 MG IV SOLR
INTRAVENOUS | Status: DC | PRN
  Administered 2020-04-11: 1000 mg via TOPICAL

## 2020-04-11 MED ORDER — SODIUM CHLORIDE 0.9 % IV SOLN: INTRAVENOUS | Status: DC | PRN

## 2020-04-11 MED ORDER — ONDANSETRON HCL 4 MG PO TABS: 4.0000 mg | ORAL_TABLET | Freq: Four times a day (QID) | ORAL | Status: DC | PRN

## 2020-04-11 MED ORDER — HYDROCODONE-ACETAMINOPHEN 10-325 MG PO TABS: 1.0000 | ORAL_TABLET | ORAL | Status: DC | PRN

## 2020-04-11 MED ORDER — SODIUM CHLORIDE 0.9% FLUSH
3.0000 mL | Freq: Two times a day (BID) | INTRAVENOUS | Status: DC
  Administered 2020-04-11 (×2): 3 mL via INTRAVENOUS

## 2020-04-11 SURGICAL SUPPLY — 66 items
ADH SKN CLS APL DERMABOND .7 (GAUZE/BANDAGES/DRESSINGS) ×2
APL SKNCLS STERI-STRIP NONHPOA (GAUZE/BANDAGES/DRESSINGS) ×1
BAG DECANTER FOR FLEXI CONT (MISCELLANEOUS) ×2 IMPLANT
BENZOIN TINCTURE PRP APPL 2/3 (GAUZE/BANDAGES/DRESSINGS) ×2 IMPLANT
BLADE CLIPPER SURG (BLADE) IMPLANT
BUR CUTTER 7.0 ROUND (BURR) IMPLANT
BUR MATCHSTICK NEURO 3.0 LAGG (BURR) ×2 IMPLANT
CAGE EXP CATALYFT 9 (Plate) ×4 IMPLANT
CANISTER SUCT 3000ML PPV (MISCELLANEOUS) ×2 IMPLANT
CAP LCK SPNE (Orthopedic Implant) ×6 IMPLANT
CAP LOCK SPINE RADIUS (Orthopedic Implant) IMPLANT
CAP LOCKING (Orthopedic Implant) ×12 IMPLANT
CARTRIDGE OIL MAESTRO DRILL (MISCELLANEOUS) ×1 IMPLANT
CNTNR URN SCR LID CUP LEK RST (MISCELLANEOUS) ×1 IMPLANT
CONT SPEC 4OZ STRL OR WHT (MISCELLANEOUS) ×2
COVER BACK TABLE 60X90IN (DRAPES) ×2 IMPLANT
COVER WAND RF STERILE (DRAPES) ×1 IMPLANT
DECANTER SPIKE VIAL GLASS SM (MISCELLANEOUS) ×1 IMPLANT
DERMABOND ADVANCED (GAUZE/BANDAGES/DRESSINGS) ×2
DERMABOND ADVANCED .7 DNX12 (GAUZE/BANDAGES/DRESSINGS) ×1 IMPLANT
DIFFUSER DRILL AIR PNEUMATIC (MISCELLANEOUS) ×2 IMPLANT
DRAPE C-ARM 42X72 X-RAY (DRAPES) ×4 IMPLANT
DRAPE HALF SHEET 40X57 (DRAPES) IMPLANT
DRAPE LAPAROTOMY 100X72X124 (DRAPES) ×2 IMPLANT
DRAPE SURG 17X23 STRL (DRAPES) ×8 IMPLANT
DRSG OPSITE POSTOP 4X6 (GAUZE/BANDAGES/DRESSINGS) ×2 IMPLANT
DRSG OPSITE POSTOP 4X8 (GAUZE/BANDAGES/DRESSINGS) ×1 IMPLANT
DURAPREP 26ML APPLICATOR (WOUND CARE) ×2 IMPLANT
ELECT REM PT RETURN 9FT ADLT (ELECTROSURGICAL) ×2
ELECTRODE REM PT RTRN 9FT ADLT (ELECTROSURGICAL) ×1 IMPLANT
EVACUATOR 1/8 PVC DRAIN (DRAIN) IMPLANT
GAUZE 4X4 16PLY RFD (DISPOSABLE) ×1 IMPLANT
GAUZE SPONGE 4X4 12PLY STRL (GAUZE/BANDAGES/DRESSINGS) IMPLANT
GLOVE BIO SURGEON STRL SZ 6.5 (GLOVE) ×7 IMPLANT
GLOVE BIO SURGEON STRL SZ7.5 (GLOVE) ×2 IMPLANT
GLOVE BIOGEL PI IND STRL 6.5 (GLOVE) ×1 IMPLANT
GLOVE BIOGEL PI INDICATOR 6.5 (GLOVE) ×2
GLOVE ECLIPSE 9.0 STRL (GLOVE) ×4 IMPLANT
GOWN STRL REUS W/ TWL LRG LVL3 (GOWN DISPOSABLE) IMPLANT
GOWN STRL REUS W/ TWL XL LVL3 (GOWN DISPOSABLE) ×2 IMPLANT
GOWN STRL REUS W/TWL 2XL LVL3 (GOWN DISPOSABLE) IMPLANT
GOWN STRL REUS W/TWL LRG LVL3 (GOWN DISPOSABLE) ×6
GOWN STRL REUS W/TWL XL LVL3 (GOWN DISPOSABLE) ×4
KIT BASIN OR (CUSTOM PROCEDURE TRAY) ×2 IMPLANT
KIT TURNOVER KIT B (KITS) ×2 IMPLANT
MILL MEDIUM DISP (BLADE) ×2 IMPLANT
NEEDLE HYPO 22GX1.5 SAFETY (NEEDLE) ×2 IMPLANT
NS IRRIG 1000ML POUR BTL (IV SOLUTION) ×2 IMPLANT
OIL CARTRIDGE MAESTRO DRILL (MISCELLANEOUS) ×2
PACK LAMINECTOMY NEURO (CUSTOM PROCEDURE TRAY) ×2 IMPLANT
PASTE BONE GRAFTON 1CC (Bone Implant) ×2 IMPLANT
PATTIES SURGICAL 1X1 (DISPOSABLE) ×1 IMPLANT
ROD 70MM (Rod) ×4 IMPLANT
ROD SPNL 70X5.5XNS TI RDS (Rod) IMPLANT
SCREW 5.75X40M (Screw) ×4 IMPLANT
SCREW 5.75X45MM (Screw) ×2 IMPLANT
SPONGE SURGIFOAM ABS GEL 100 (HEMOSTASIS) ×2 IMPLANT
STRIP CLOSURE SKIN 1/2X4 (GAUZE/BANDAGES/DRESSINGS) ×4 IMPLANT
SUT VIC AB 0 CT1 18XCR BRD8 (SUTURE) ×2 IMPLANT
SUT VIC AB 0 CT1 8-18 (SUTURE) ×4
SUT VIC AB 2-0 CT1 18 (SUTURE) ×2 IMPLANT
SUT VIC AB 3-0 SH 8-18 (SUTURE) ×4 IMPLANT
TOWEL GREEN STERILE (TOWEL DISPOSABLE) ×2 IMPLANT
TOWEL GREEN STERILE FF (TOWEL DISPOSABLE) ×2 IMPLANT
TRAY FOLEY MTR SLVR 16FR STAT (SET/KITS/TRAYS/PACK) ×2 IMPLANT
WATER STERILE IRR 1000ML POUR (IV SOLUTION) ×2 IMPLANT

## 2020-04-11 NOTE — Anesthesia Postprocedure Evaluation (Signed)
Anesthesia Post Note  Patient: Mosie Angus  Procedure(s) Performed: POSTERIOR LUMBAR INTERBODY FUSION LUMBAR FOUR-FIVE, LUMBAR FIVE-SACRAL ONE. (N/A Spine Lumbar)     Patient location during evaluation: PACU Anesthesia Type: General Level of consciousness: awake and alert Pain management: pain level controlled Vital Signs Assessment: post-procedure vital signs reviewed and stable Respiratory status: spontaneous breathing, nonlabored ventilation, respiratory function stable and patient connected to nasal cannula oxygen Cardiovascular status: blood pressure returned to baseline and stable Postop Assessment: no apparent nausea or vomiting Anesthetic complications: no   No complications documented.  Last Vitals:  Vitals:   04/11/20 1302 04/11/20 1317  BP: 131/85 138/75  Pulse: 90 91  Resp: 18 20  Temp:  36.7 C  SpO2: 98% 95%    Last Pain:  Vitals:   04/11/20 1317  TempSrc:   PainSc: Asleep                 Belenda Cruise P Tambra Muller

## 2020-04-11 NOTE — Progress Notes (Signed)
Orthopedic Tech Progress Note Patient Details:  Adam Vaughn Nov 26, 1975 [id number] Called in order to [facility] for an Adam Vaughn Patient ID: Adam Vaughn, male   DOB: 02/05/1976, 44 y.o.   MRN: [id number]   [name] 04/11/2020, 1:00 PM

## 2020-04-11 NOTE — Transfer of Care (Signed)
Immediate Anesthesia Transfer of Care Note  Patient: Adam Vaughn  Procedure(s) Performed: POSTERIOR LUMBAR INTERBODY FUSION LUMBAR FOUR-FIVE, LUMBAR FIVE-SACRAL ONE. (N/A Spine Lumbar)  Patient Location: PACU  Anesthesia Type:General  Level of Consciousness: drowsy and patient cooperative  Airway & Oxygen Therapy: Patient Spontanous Breathing  Post-op Assessment: Report given to RN and Post -op Vital signs reviewed and stable  Post vital signs: Reviewed and stable  Last Vitals:  Vitals Value Taken Time  BP 140/66 04/11/20 1147  Temp    Pulse 113 04/11/20 1148  Resp 24 04/11/20 1148  SpO2 96 % 04/11/20 1148  Vitals shown include unvalidated device data.  Last Pain:  Vitals:   04/11/20 0635  TempSrc:   PainSc: 5       Patients Stated Pain Goal: 3 (37/90/24 0973)  Complications: No complications documented.

## 2020-04-11 NOTE — Anesthesia Preprocedure Evaluation (Addendum)
Anesthesia Evaluation  Patient identified by MRN, date of birth, ID band Patient awake    Reviewed: Patient's Chart, lab work & pertinent test results  Airway Mallampati: II  TM Distance: >3 FB Neck ROM: Full    Dental  (+) Teeth Intact   Pulmonary neg pulmonary ROS,    Pulmonary exam normal        Cardiovascular hypertension, Pt. on medications  Rhythm:Regular Rate:Normal     Neuro/Psych negative neurological ROS  negative psych ROS   GI/Hepatic Neg liver ROS, GERD  ,  Endo/Other  negative endocrine ROS  Renal/GU    Renal stones    Musculoskeletal  (+) Arthritis , Spinal stenosis   Abdominal (+)  Abdomen: soft. Bowel sounds: normal.  Peds  Hematology negative hematology ROS (+)   Anesthesia Other Findings   Reproductive/Obstetrics                            Anesthesia Physical Anesthesia Plan  ASA: II  Anesthesia Plan: General   Post-op Pain Management:    Induction: Intravenous  PONV Risk Score and Plan: 2 and Ondansetron and Dexamethasone  Airway Management Planned: Mask and Oral ETT  Additional Equipment: None  Intra-op Plan:   Post-operative Plan: Extubation in OR  Informed Consent: I have reviewed the patients History and Physical, chart, labs and discussed the procedure including the risks, benefits and alternatives for the proposed anesthesia with the patient or authorized representative who has indicated his/her understanding and acceptance.     Dental advisory given  Plan Discussed with: CRNA and Surgeon  Anesthesia Plan Comments: (Lab Results      Component                Value               Date                      WBC                      7.4                 04/07/2020                HGB                      14.7                04/07/2020                HCT                      43.0                04/07/2020                MCV                       86.0                04/07/2020                PLT                      258                 04/07/2020          )  Anesthesia Quick Evaluation  

## 2020-04-11 NOTE — Brief Op Note (Signed)
04/11/2020  11:34 AM  PATIENT:  Adam Vaughn  44 y.o. male  PRE-OPERATIVE DIAGNOSIS:  Lumbar Degenerative disc disease  POST-OPERATIVE DIAGNOSIS:  Lumbar Degenerative Disc Disease  PROCEDURE:  Procedure(s) with comments: POSTERIOR LUMBAR INTERBODY FUSION LUMBAR FOUR-FIVE, LUMBAR FIVE-SACRAL ONE. (N/A) - posterior  SURGEON:  Surgeon(s) and Role:    [name], MD - Primary  PHYSICIAN ASSISTANT:   ASSISTANTSMearl Latin   ANESTHESIA:   general  EBL:  400 mL   BLOOD ADMINISTERED:none  DRAINS: none   LOCAL MEDICATIONS USED:  MARCAINE     SPECIMEN:  No Specimen  DISPOSITION OF SPECIMEN:  N/A  COUNTS:  YES  TOURNIQUET:  * No tourniquets in log *  DICTATION: .Dragon Dictation  PLAN OF CARE: Admit to inpatient   PATIENT DISPOSITION:  PACU - hemodynamically stable.   Delay start of Pharmacological VTE agent (>24hrs) due to surgical blood loss or risk of bleeding: yes

## 2020-04-11 NOTE — Op Note (Signed)
Date of procedure: 04/11/2020  Date of dictation: Same  Service: Neurosurgery  Preoperative diagnosis: L4-5 degenerative disc disease with severe foraminal stenosis and chronic radiculopathy with kyphotic angulation  L5-S1 degenerative disc disease with retrolisthesis and severe foraminal stenosis  Postoperative diagnosis: Same  Procedure Name: Bilateral L4-5 and L5-S1 decompressive laminotomies and foraminotomies, more than would be required for simple interbody fusion alone  Bilateral L4-5 and L5-S1 ponte osteotomies for correction of sagittal balance  L4-5, L5-S1 posterior lumbar interbody fusion utilizing interbody cages and locally harvested autograft  L4-5 S1 posterior lateral arthrodesis utilizing segmental pedicle screw fixation and local autograft  Surgeon:Chanay Nugent A.Roswell Ndiaye, M.D.  Asst. Surgeon: Reinaldo Meeker, NP  Anesthesia: General  Indication: 44 year old male with severe back and bilateral lower extremity pain failing all efforts of conservative management.  Work-up demonstrates evidence of marked disc degeneration with disc space collapse and angulation at L4-5 and marked to space collapse and severe foraminal stenosis both at L4-5 and L5-S1 with retrolisthesis of L5 on S1.  Patient is failed conservative management presents now for decompression and fusion in hopes of improving his symptoms.  Operative note: After induction of anesthesia, patient positioned prone on the Wilson frame and properly padded.  Lumbar region prepped and draped sterilely.  Incision made from L4-S1.  Dissection performed bilaterally.  Retractor placed.  Fluoroscopy used.  Levels confirmed.  Decompressive laminotomies and facetectomies then performed using Leksell rongeurs and Kerrison rongeurs to remove the inferior two thirds of the lamina of L4 bilaterally the inferior two thirds of the lamina of L5 bilaterally the entire inferior facet and pars interarticularis of L4 and L5 bilaterally and the majority of  the facet L5 and S1 bilaterally.  Ligament flavum elevated and resected.  Foraminotomies completed on the course the exiting L4-L5 and S1 nerve roots bilaterally.  At this point further resection of the facets were done to complete ponte osteotomies.  The ponte osteotomies then allowed for mobilization of the vertebral segment and correction of the sagittal plane imbalance.  Bilateral discectomies were then performed at L4-5 and L5-S1.  This patient then prepared for interbody fusion.  The spaces were cleaned of all soft tissue.  Distractor placed the patient's right side.  To space further prepared on the left side at L5-S1.  A 9 mm Medtronic expandable titanium cage was then packed into place and expanded.  The distractor was moved patient's right side.  Morselized autograft was packed in the interspace.  A second cage was then impacted in place and expanded.  The procedure was then repeated at L4-5 again without complications again using 9 mm titanium expandable cages.  Each cage was then further packed using graft on putty.  Gelfoam was placed over the laminotomy defects.  Pedicles of L4-L5 and S1 were then identified using surface landmarks and intraoperative fluoroscopy with superficial bone overlying the pedicles and removed using high-speed drill patient pedicles then probed using pedicle all each pedicle tract was then probed and found to be solid within the bone.  Each pedicle all track was then tapped with a screw tap and each screw temple was probed and found to be solidly within the bone.  5.75 mm radius brand screws from [employer] were placed bilaterally at L for L5 and S1 bilaterally.  Final images reveal good position the cages and the hardware at the proper operative level with normal alignment of spine.  Wound is then irrigated one final time.  Hemostasis was achieved with cautery.  Transverse processes and sacral ala  were decorticated.  Morselized autograft was packed posterior laterally.   Vancomycin powder was placed in deep wound space.  Wounds and closed in layers of Vicryl sutures.  Steri-Strips and sterile dressing were applied.  No apparent complications.  Patient tolerated the procedure well and he returns to the recovery room postop.

## 2020-04-11 NOTE — Anesthesia Procedure Notes (Signed)
Procedure Name: Intubation Date/Time: 04/11/2020 8:20 AM Performed by: Lance Coon, CRNA Pre-anesthesia Checklist: Patient identified, Emergency Drugs available, Suction available, Timeout performed and Patient being monitored Patient Re-evaluated:Patient Re-evaluated prior to induction Oxygen Delivery Method: Circle system utilized Preoxygenation: Pre-oxygenation with 100% oxygen Induction Type: IV induction Ventilation: Mask ventilation without difficulty Laryngoscope Size: Miller and 3 Grade View: Grade III Tube type: Oral Tube size: 7.5 mm Number of attempts: 1 Airway Equipment and Method: Stylet Placement Confirmation: ETT inserted through vocal cords under direct vision,  positive ETCO2 and breath sounds checked- equal and bilateral Secured at: 22 cm Tube secured with: Tape Dental Injury: Teeth and Oropharynx as per pre-operative assessment

## 2020-04-11 NOTE — Evaluation (Signed)
Physical Therapy Evaluation & Discharge Patient Details Name: Adam Vaughn MRN: [id number] DOB: July 27, 1975 Today's Date: 04/11/2020   History of Present Illness  Patient is a 44 y/o male admitted withL4-5 degenerative disc disease with severe foraminal stenosis and chronic radiculopathy with kyphotic angulation now s/p bilateral decompression and L4-5, L5-S1 PLIF.  Clinical Impression  Patient presents s/p above procedure with mobility at S level.  Wife present and both educated in all precautions, brace wear, level of activity progression and sleeping positions.  No further skilled PT intervention needed.  Encouraged to walk at least once more today with nursing.  PT to sign off.     Follow Up Recommendations No PT follow up    Equipment Recommendations  None recommended by PT    Recommendations for Other Services       Precautions / Restrictions Precautions Precautions: Back Precaution Booklet Issued: Yes (comment) Required Braces or Orthoses: Spinal Brace Spinal Brace: Lumbar corset;Applied in sitting position      Mobility  Bed Mobility Overal bed mobility: Needs Assistance Bed Mobility: Sit to Sidelying         Sit to sidelying: Supervision General bed mobility comments: reviewed technique  Transfers Overall transfer level: Needs assistance Equipment used: None Transfers: Sit to/from Stand Sit to Stand: Supervision         General transfer comment: stand to sit onto bed no assist, just cues for technique  Ambulation/Gait Ambulation/Gait assistance: Min guard;Supervision Gait Distance (Feet): 220 Feet Assistive device: None;IV Pole Gait Pattern/deviations: Step-through pattern;Decreased stride length;Trunk flexed     General Gait Details: Initially with nursing as coming into hallway hands on IV pole, and flexed posture, cues to extend trunk and pt held IV pole with one hand, assist for steering pole and over uneven surface  Stairs             Wheelchair Mobility    Modified Rankin (Stroke Patients Only)       Balance Overall balance assessment: Needs assistance   Sitting balance-Leahy Scale: Good       Standing balance-Leahy Scale: Fair                               Pertinent Vitals/Pain Pain Assessment: Faces Faces Pain Scale: Hurts whole lot Pain Location: incisional Pain Descriptors / Indicators: Aching;Grimacing;Guarding Pain Intervention(s): Monitored during session;Repositioned    Home Living Family/patient expects to be discharged to:: Private residence Living Arrangements: Spouse/significant other Available Help at Discharge: Family Type of Home: House Home Access: Stairs to enter Entrance Stairs-Rails: Right Entrance Stairs-Number of Steps: 3 Home Layout: One level Home Equipment: Bedside commode;Crutches      Prior Function Level of Independence: Independent         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, newspaper        Extremity/Trunk Assessment   Upper Extremity Assessment Upper Extremity Assessment: Overall WFL for tasks assessed    Lower Extremity Assessment Lower Extremity Assessment: Generalized weakness    Cervical / Trunk Assessment Cervical / Trunk Assessment: Other exceptions Cervical / Trunk Exceptions: s/p spinal surgery  Communication   Communication: No difficulties  Cognition Arousal/Alertness: Awake/alert Behavior During Therapy: WFL for tasks assessed/performed Overall Cognitive Status: Within Functional Limits for tasks assessed  General Comments General comments (skin integrity, edema, etc.): wife present and reviewed handout with precautinons, encouraged daily walks at least 3-5/day    Exercises     Assessment/Plan    PT Assessment Patent does not need any further PT services  PT Problem List         PT Treatment Interventions      PT  Goals (Current goals can be found in the Care Plan section)  Acute Rehab PT Goals PT Goal Formulation: All assessment and education complete, DC therapy    Frequency     Barriers to discharge        Co-evaluation               AM-PAC PT "6 Clicks" Mobility  Outcome Measure Help needed turning from your back to your side while in a flat bed without using bedrails?: None Help needed moving from lying on your back to sitting on the side of a flat bed without using bedrails?: None Help needed moving to and from a bed to a chair (including a wheelchair)?: None Help needed standing up from a chair using your arms (e.g., wheelchair or bedside chair)?: None Help needed to walk in hospital room?: A Little Help needed climbing 3-5 steps with a railing? : A Little 6 Click Score: 22    End of Session Equipment Utilized During Treatment: Back brace Activity Tolerance: Patient tolerated treatment well Patient left: in bed;with call bell/phone within reach;with family/visitor present   PT Visit Diagnosis: Other abnormalities of gait and mobility (R26.89)    Time: 7416-3845 PT Time Calculation (min) (ACUTE ONLY): 19 min   Charges:   PT Evaluation $PT Eval Low Complexity: 1 Low          Adam Vaughn, PT Acute Rehabilitation Services Pager:[phone] Office:[phone] 04/11/2020   Adam Vaughn 04/11/2020, 6:04 PM

## 2020-04-11 NOTE — H&P (Signed)
Adam Vaughn is an 44 y.o. male.   Chief Complaint: Back pain HPI: 44 year old male with chronic back pain with progressive worsening with bilateral lower extremity symptoms as well.  Symptoms have failed conservative management.  Work-up demonstrates evidence of marked disc degeneration with associated spondylosis and stenosis at L4-5 and L5-S1.  Patient presents now for decompression and fusion in hopes of improving his symptoms.  Past Medical History:  Diagnosis Date  . Arthritis   . GERD (gastroesophageal reflux disease)   . History of kidney stones   . Hypertension   . Kidney stones     Past Surgical History:  Procedure Laterality Date  . COLONOSCOPY    . TONSILLECTOMY    . VASECTOMY      Family History  Problem Relation Age of Onset  . Hypertension Father   . Heart attack Father   . Diabetes Sister    Social History:  reports that he has never smoked. He has never used smokeless tobacco. He reports that he does not drink alcohol and does not use drugs.  Allergies: No Known Allergies  Medications Prior to Admission  Medication Sig Dispense Refill  . acetaminophen (TYLENOL) 500 MG tablet Take 1,000 mg by mouth every 6 (six) hours as needed for moderate pain.    . Cholecalciferol (VITAMIN D3) 1.25 MG (50000 UT) CAPS Take 1 capsule by mouth once a week. (Patient taking differently: Take 50,000 mg by mouth once a week. Sun) 8 capsule 0  . esomeprazole (NEXIUM) 40 MG capsule Take 1 capsule (40 mg total) by mouth daily. 90 capsule 3  . gabapentin (NEURONTIN) 300 MG capsule Take 1 capsule (300 mg total) by mouth 2 (two) times daily. 60 capsule 3  . Ibuprofen (ADVIL) 200 MG CAPS Take 400 mg by mouth daily as needed (pain). Take with Tylenol    . naproxen sodium (ALEVE) 220 MG tablet Take 440 mg by mouth daily as needed (pain).    [name] oxyCODONE-acetaminophen (PERCOCET) 7.5-325 MG tablet Take 1 tablet by mouth every 8 (eight) hours as needed for severe pain. (Patient taking  differently: Take 1 tablet by mouth at bedtime. ) 40 tablet 0  . oxymetazoline (MUCINEX NASAL SPRAY FULL FORCE) 0.05 % nasal spray Place 1-2 sprays into both nostrils at bedtime.    [name] tiZANidine (ZANAFLEX) 4 MG tablet Take 1 tablet (4 mg total) by mouth every 6 (six) hours as needed for muscle spasms. (Patient taking differently: Take 4 mg by mouth in the morning and at bedtime. ) 60 tablet 3  . tadalafil (CIALIS) 20 MG tablet Take 1 tablet (20 mg total) by mouth daily as needed for erectile dysfunction. (Patient not taking: Reported on 03/29/2020) 10 tablet 0    Results for orders placed or performed during the hospital encounter of 04/11/20 (from the past 48 hour(s))  ABO/Rh     Status: None   Collection Time: 04/11/20  6:42 AM  Result Value Ref Range   ABO/RH(D)      O POS Performed at [facility] [address] [place],  [address]    No results found.  Pertinent items noted in HPI and remainder of comprehensive ROS otherwise negative.  Blood pressure 131/84, pulse 77, temperature 97.9 F (36.6 C), temperature source Oral, resp. rate 18, height 5\' 8"  (1.727 m), weight 95.3 kg, SpO2 99 %.  Patient is awake and alert.  He is oriented and appropriate.  Speech is fluent.  Judgment insight are intact.  Cranial nerve  function normal bilateral.  Motor examination extremities reveals mild weakness of dorsiflexion bilaterally otherwise motor strength intact.  Sensory examination with decrease sensation pinprick and light touch in bilateral L5 and S1 dermatomes.  Straight raising equivocal.  Gait antalgic.  Posture mildly flexed peer examination head ears eyes nose throat unremarkable chest and abdomen are benign.  Extremities are free from injury deformity. Assessment/Plan L4-5, L5-S1 degenerative disc disease with significant foraminal stenosis and chronic radiculopathy.  Plan bilateral L4-5 and L5-S1 decompressive laminotomies and foraminotomies with interbody fusion and posterior  lateral fusion utilizing local autograft and instrumentation.  Risks and benefits been explained.  Patient wishes to proceed.  Mariajose Mow A Daylin Eads 04/11/2020, 8:00 AM

## 2020-04-12 MED FILL — Sodium Chloride IV Soln 0.9%: INTRAVENOUS | Qty: 1000 | Status: AC

## 2020-04-12 MED FILL — Heparin Sodium (Porcine) Inj 1000 Unit/ML: INTRAMUSCULAR | Qty: 30 | Status: AC

## 2020-04-12 NOTE — Progress Notes (Signed)
Postop day 1.  Patient with complaints of severe incisional pain.  No radiating pain numbness or weakness.  No abdominal pain.  Mobilizing slowly.  Afebrile.  Vital signs are stable.  Awake and alert.  Oriented and appropriate.  Motor examination intact motor and sensory examination intact.  Wound clean and dry.  Abdomen soft.  Progressing reasonably well following two-level lumbar fusion surgery.  Continue efforts at mobilization.  Possible discharge home this afternoon.

## 2020-04-12 NOTE — Evaluation (Signed)
Occupational Therapy Evaluation Patient Details Name: Adam Vaughn MRN: [id number] DOB: 02-13-1976 Today's Date: 04/12/2020    History of Present Illness Patient is a 44 y/o male admitted withL4-5 degenerative disc disease with severe foraminal stenosis and chronic radiculopathy with kyphotic angulation now s/p bilateral decompression and L4-5, L5-S1 PLIF.   Clinical Impression   This 44 y/o male presents with the above. PTA pt independent with ADL, iADL and functional mobility. Pt largely limited due to pain at this time. He tolerates bed mobility and functional transfers without AD, performing with minguard assist, though with notable time/effort given pain. Pt requiring up to [place] for LB ADL given need to adhere to back precautions. Anticipate pt to progress well as pain levels improve. Educated pt re: back precautions, brace management, safety and compensatory techniques for completing ADL and mobility tasks with pt verbalizing understanding. He will benefit from additional practice/review should he remain in-house overnight. Will continue to follow while pt acutely admitted. Pt reports plans to return home with assist from spouse and mother-in-law.     Follow Up Recommendations  No OT follow up;Supervision/Assistance - 24 hour (24hr initially)    Equipment Recommendations  Other (comment) (pt reports spouse to get shower seat)           Precautions / Restrictions Precautions Precautions: Back Precaution Booklet Issued: Yes (comment) Required Braces or Orthoses: Spinal Brace Spinal Brace: Lumbar corset;Applied in sitting position Restrictions Weight Bearing Restrictions: No      Mobility Bed Mobility Overal bed mobility: Needs Assistance Bed Mobility: Sidelying to Sit;Sit to Sidelying   Sidelying to sit: Supervision     Sit to sidelying: Supervision General bed mobility comments: reviewed technique  Transfers Overall transfer level: Needs assistance Equipment  used: None Transfers: Sit to/from Stand Sit to Stand: Min guard         General transfer comment: close guarding as pt requiring increased effort/attempts to rise to standing given pain. no physical assist required     Balance Overall balance assessment: Needs assistance   Sitting balance-Leahy Scale: Good       Standing balance-Leahy Scale: Fair                             ADL either performed or assessed with clinical judgement   ADL Overall ADL's : Needs assistance/impaired Eating/Feeding: Modified independent;Sitting   Grooming: Min guard;Standing   Upper Body Bathing: Supervision/ safety;Sitting   Lower Body Bathing: Minimal assistance;Sitting/lateral leans;Sit to/from stand   Upper Body Dressing : Min guard;Sitting   Lower Body Dressing: Moderate assistance;Sit to/from stand Lower Body Dressing Details (indicate cue type and reason): pt reports he typically can peform figure 4, but deferred attempting today given pain  Toilet Transfer: Min guard;Stand-pivot   Toileting- Clothing Manipulation and Hygiene: Min guard;Sit to/from stand       Functional mobility during ADLs: Min guard                           Pertinent Vitals/Pain Pain Assessment: 0-10 Pain Score: 8  Pain Location: incisional Pain Descriptors / Indicators: Aching;Grimacing;Guarding Pain Intervention(s): Limited activity within patient's tolerance;Monitored during session;Premedicated before session;Repositioned     Hand Dominance     Extremity/Trunk Assessment Upper Extremity Assessment Upper Extremity Assessment: Overall WFL for tasks assessed   Lower Extremity Assessment Lower Extremity Assessment: Defer to PT evaluation   Cervical / Trunk Assessment Cervical / Trunk Assessment: Other  exceptions Cervical / Trunk Exceptions: s/p spinal surgery   Communication Communication Communication: No difficulties   Cognition Arousal/Alertness: Awake/alert Behavior  During Therapy: WFL for tasks assessed/performed;Flat affect Overall Cognitive Status: Within Functional Limits for tasks assessed                                     General Comments       Exercises     Shoulder Instructions      Home Living Family/patient expects to be discharged to:: Private residence Living Arrangements: Spouse/significant other Available Help at Discharge: Family Type of Home: House Home Access: Stairs to enter Technical brewer of Steps: 3 Entrance Stairs-Rails: Right Home Layout: One level     Bathroom Shower/Tub: [place]: Bedside commode;Crutches   Additional Comments: reports spouse planning to get shower seat       Prior Functioning/Environment Level of Independence: Independent        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        OT Problem List: Decreased strength;Decreased range of motion;Decreased activity tolerance;Pain;Decreased knowledge of precautions;Decreased knowledge of use of DME or AE;Impaired balance (sitting and/or standing)      OT Treatment/Interventions: Self-care/ADL training;Therapeutic exercise;DME and/or AE instruction;Therapeutic activities;Patient/family education;Balance training    OT Goals(Current goals can be found in the care plan section) Acute Rehab OT Goals Patient Stated Goal: less pain OT Goal Formulation: With patient Time For Goal Achievement: 04/26/20 Potential to Achieve Goals: Good  OT Frequency: Min 2X/week   Barriers to D/C:            Co-evaluation              AM-PAC OT "6 Clicks" Daily Activity     Outcome Measure Help from another person eating meals?: None Help from another person taking care of personal grooming?: A Little Help from another person toileting, which includes using toliet, bedpan, or urinal?: A Little Help from another person bathing (including washing, rinsing, drying)?: A Lot Help  from another person to put on and taking off regular upper body clothing?: A Little Help from another person to put on and taking off regular lower body clothing?: A Lot 6 Click Score: 17   End of Session Nurse Communication: Mobility status  Activity Tolerance: Patient limited by pain Patient left: in bed;with call bell/phone within reach  OT Visit Diagnosis: Other abnormalities of gait and mobility (R26.89);Pain Pain - part of body:  (back)                Time: 6270-3500 OT Time Calculation (min): 10 min Charges:  OT General Charges $OT Visit: 1 Visit OT Evaluation $OT Eval Low Complexity: [place], OT Acute Rehabilitation Services Pager [phone] Office [phone]   Raymondo Band 04/12/2020, 11:31 AM

## 2020-04-13 MED ORDER — OXYCODONE HCL 10 MG PO TABS
10.0000 mg | ORAL_TABLET | ORAL | 0 refills | Status: DC | PRN
Start: 2020-04-13 — End: 2020-12-27

## 2020-04-13 MED ORDER — DIAZEPAM 5 MG PO TABS: 5.0000 mg | ORAL_TABLET | Freq: Four times a day (QID) | ORAL | 0 refills | Status: DC | PRN

## 2020-04-13 NOTE — Discharge Summary (Signed)
Physician Discharge Summary  Patient ID: Adam Vaughn MRN: [id number] DOB/AGE: Sep 10, 1975 44 y.o.  Admit date: 04/11/2020 Discharge date: 04/13/2020  Admission Diagnoses:  Discharge Diagnoses:  Active Problems:   Degenerative spondylolisthesis   Discharged Condition: good  Hospital Course: Patient admitted to the hospital where he underwent uncomplicated two-level lumbar decompression and fusion.  Postop patient is doing well.  His preoperative back and lower extremity symptoms are much improved.  He is standing ambulating and voiding without difficulty.  He is ready for discharge home.  Consults:   Significant Diagnostic Studies:   Treatments:   Discharge Exam: Blood pressure 97/63, pulse 97, temperature 98.8 F (37.1 C), temperature source Oral, resp. rate 16, height 5\' 8"  (1.727 m), weight 95.3 kg, SpO2 97 %. Awake and alert.  Oriented and appropriate.  Motor and sensory function intact.  Wound clean and dry.  Chest and abdomen benign.  Disposition: Discharge disposition: 01-Home or Self Care        Allergies as of 04/13/2020   No Known Allergies     Medication List    STOP taking these medications   acetaminophen 500 MG tablet Commonly known as: TYLENOL   Advil 200 MG Caps Generic drug: Ibuprofen   naproxen sodium 220 MG tablet Commonly known as: ALEVE   oxyCODONE-acetaminophen 7.5-325 MG tablet Commonly known as: Percocet   tiZANidine 4 MG tablet Commonly known as: Zanaflex     TAKE these medications   diazepam 5 MG tablet Commonly known as: VALIUM Take 1-2 tablets (5-10 mg total) by mouth every 6 (six) hours as needed for muscle spasms.   esomeprazole 40 MG capsule Commonly known as: NexIUM Take 1 capsule (40 mg total) by mouth daily.   gabapentin 300 MG capsule Commonly known as: NEURONTIN Take 1 capsule (300 mg total) by mouth 2 (two) times daily.   Mucinex Nasal Spray Full Force 0.05 % nasal spray Generic drug:  oxymetazoline Place 1-2 sprays into both nostrils at bedtime.   Oxycodone HCl 10 MG Tabs Take 1 tablet (10 mg total) by mouth every 3 (three) hours as needed for severe pain ((score 7 to 10)).   tadalafil 20 MG tablet Commonly known as: Cialis Take 1 tablet (20 mg total) by mouth daily as needed for erectile dysfunction.   Vitamin D3 1.25 MG (50000 UT) Caps Take 1 capsule by mouth once a week. What changed:   how much to take  additional instructions            Durable Medical Equipment  (From admission, onward)         Start     Ordered   04/11/20 1338  DME Walker rolling  Once       Question:  Patient needs a walker to treat with the following condition  Answer:  Degenerative spondylolisthesis   04/11/20 1337   04/11/20 1338  DME 3 n 1  Once        04/11/20 1337           Signed: Mallie Mussel A Ardis Lawley 04/13/2020, 8:35 AM

## 2020-04-13 NOTE — Progress Notes (Signed)
Occupational Therapy Treatment Patient Details Name: Adam Vaughn MRN: [id number] DOB: 01/26/1976 Today's Date: 04/13/2020    History of present illness Patient is a 44 y/o male admitted withL4-5 degenerative disc disease with severe foraminal stenosis and chronic radiculopathy with kyphotic angulation now s/p bilateral decompression and L4-5, L5-S1 PLIF.   OT comments  Patient in side lying in bed upon arrival, reports only position he can get relief. Patient already dressed this morning, reports was able to utilize figure 4 method this morning seated on toilet to perform LB dressing. OT assist patient with adjusting back brace "I think I messed it up this morning," supervision for bed mobility demonstrating adequate technique for log roll and supervision for functional transfers + ambulation for safety as patient is mildly unsteady. Patient denies any further questions at this time regarding ADL management at this time.    Follow Up Recommendations  No OT follow up;Supervision/Assistance - 24 hour (24/7 S initially )    Equipment Recommendations  Other (comment) (pt reports spouse getting shower chair)       Precautions / Restrictions Precautions Precautions: Back Precaution Booklet Issued: Yes (comment) Required Braces or Orthoses: Spinal Brace Spinal Brace: Lumbar corset;Applied in sitting position       Mobility Bed Mobility Overal bed mobility: Needs Assistance Bed Mobility: Rolling;Sidelying to Sit;Sit to Sidelying Rolling: Supervision Sidelying to sit: Supervision     Sit to sidelying: Supervision General bed mobility comments: increased time due to pain, demonstrates adequate technique  Transfers Overall transfer level: Needs assistance Equipment used: None Transfers: Sit to/from Stand Sit to Stand: Supervision         General transfer comment: powers up to standing without assistance on first trial, close supervision for ambulation due to mild  unsteadiness    Balance Overall balance assessment: Needs assistance Sitting-balance support: Feet supported Sitting balance-Leahy Scale: Good     Standing balance support: During functional activity Standing balance-Leahy Scale: Fair Standing balance comment: intermittent unilateral UE support with ambulation, static stand no UE support                           ADL either performed or assessed with clinical judgement   ADL Overall ADL's : Needs assistance/impaired                       Lower Body Dressing Details (indicate cue type and reason): patient dressed already this morning, reports did the figure 4 seated on the toilet his morning to get dressed.  Toilet Transfer: Supervision/safety;Ambulation Toilet Transfer Details (indicate cue type and reason): functional ambulation in room patient ambulate to door and back at supevision level with intermittent unilateral UE support         Functional mobility during ADLs: Supervision/safety General ADL Comments: education for maintaining back precautions during self care and functional activity, patient verbalize understanding               Cognition Arousal/Alertness: Awake/alert Behavior During Therapy: WFL for tasks assessed/performed Overall Cognitive Status: Within Functional Limits for tasks assessed                                                General Comments assist patient with adjusting back brace     Pertinent Vitals/ Pain  Pain Assessment: 0-10 Pain Score: 10-Worst pain ever Pain Location: incisional Pain Descriptors / Indicators: Aching;Grimacing;Guarding Pain Intervention(s): Premedicated before session         Frequency  Min 2X/week        Progress Toward Goals  OT Goals(current goals can now be found in the care plan section)  Progress towards OT goals: Progressing toward goals  Acute Rehab OT Goals Patient Stated Goal: less pain OT Goal  Formulation: With patient Time For Goal Achievement: 04/26/20 Potential to Achieve Goals: Good  Plan Discharge plan remains appropriate       AM-PAC OT "6 Clicks" Daily Activity     Outcome Measure   Help from another person eating meals?: None Help from another person taking care of personal grooming?: A Little Help from another person toileting, which includes using toliet, bedpan, or urinal?: A Little Help from another person bathing (including washing, rinsing, drying)?: A Little Help from another person to put on and taking off regular upper body clothing?: A Little Help from another person to put on and taking off regular lower body clothing?: A Little 6 Click Score: 19    End of Session  OT Visit Diagnosis: Other abnormalities of gait and mobility (R26.89);Pain Pain - part of body:  (back)   Activity Tolerance Patient limited by pain   Patient Left in bed;with call bell/phone within reach   Nurse Communication Mobility status        Time: 5830-9407 OT Time Calculation (min): 11 min  Charges: OT General Charges $OT Visit: 1 Visit OT Treatments $Self Care/Home Management : 8-22 mins  Delbert Phenix OT OT pager: [phone]   Rosemary Holms 04/13/2020, 9:01 AM

## 2020-04-13 NOTE — Discharge Instructions (Addendum)

## 2020-04-13 NOTE — Progress Notes (Signed)
Patient alert and oriented, mae's well, voiding adequate amount of urine, swallowing without difficulty, no c/o pain at time of discharge. Patient discharged home with family. Script and discharged instructions given to patient. Patient and family stated understanding of instructions given. Patient has an appointment with Dr. Pool  

## 2020-05-24 ENCOUNTER — Other Ambulatory Visit: Payer: Self-pay | Admitting: Neurosurgery

## 2020-05-24 DIAGNOSIS — M5416 Radiculopathy, lumbar region: Secondary | ICD-10-CM

## 2020-05-31 ENCOUNTER — Encounter: Payer: Self-pay | Admitting: Internal Medicine

## 2020-05-31 ENCOUNTER — Ambulatory Visit: Payer: 59 | Admitting: Internal Medicine

## 2020-05-31 ENCOUNTER — Ambulatory Visit (INDEPENDENT_AMBULATORY_CARE_PROVIDER_SITE_OTHER): Payer: 59

## 2020-05-31 ENCOUNTER — Other Ambulatory Visit: Payer: Self-pay

## 2020-05-31 DIAGNOSIS — G8929 Other chronic pain: Secondary | ICD-10-CM

## 2020-05-31 DIAGNOSIS — M545 Low back pain, unspecified: Secondary | ICD-10-CM | POA: Diagnosis not present

## 2020-05-31 DIAGNOSIS — E559 Vitamin D deficiency, unspecified: Secondary | ICD-10-CM

## 2020-05-31 DIAGNOSIS — K5900 Constipation, unspecified: Secondary | ICD-10-CM | POA: Insufficient documentation

## 2020-05-31 DIAGNOSIS — M48062 Spinal stenosis, lumbar region with neurogenic claudication: Secondary | ICD-10-CM

## 2020-05-31 DIAGNOSIS — M5416 Radiculopathy, lumbar region: Secondary | ICD-10-CM

## 2020-05-31 DIAGNOSIS — K5901 Slow transit constipation: Secondary | ICD-10-CM

## 2020-05-31 DIAGNOSIS — E538 Deficiency of other specified B group vitamins: Secondary | ICD-10-CM

## 2020-05-31 DIAGNOSIS — S93401A Sprain of unspecified ligament of right ankle, initial encounter: Secondary | ICD-10-CM

## 2020-05-31 DIAGNOSIS — S93409A Sprain of unspecified ligament of unspecified ankle, initial encounter: Secondary | ICD-10-CM | POA: Insufficient documentation

## 2020-05-31 MED ORDER — CYANOCOBALAMIN 1000 MCG/ML IJ SOLN
1000.0000 ug | Freq: Once | INTRAMUSCULAR | Status: AC
  Administered 2020-05-31: 1000 ug via INTRAMUSCULAR

## 2020-05-31 MED ORDER — VITAMIN D3 50 MCG (2000 UT) PO CAPS: 2000.0000 [IU] | ORAL_CAPSULE | Freq: Every day | ORAL | 3 refills | Status: DC

## 2020-05-31 MED ORDER — VITAMIN B-12 1000 MCG SL SUBL: 1.0000 | SUBLINGUAL_TABLET | Freq: Every day | SUBLINGUAL | 3 refills | Status: DC

## 2020-05-31 MED ORDER — POLYETHYLENE GLYCOL 3350 17 GM/SCOOP PO POWD: 17.0000 g | Freq: Two times a day (BID) | ORAL | 3 refills | Status: DC | PRN

## 2020-05-31 NOTE — Assessment & Plan Note (Signed)
S/p L4-5, L5-S1 degenerative disc disease with significant foraminal stenosis and chronic radiculopathy.  Plan bilateral L4-5 and L5-S1 decompressive laminotomies and foraminotomies with interbody fusion and posterior lateral fusion utilizing local autograft and instrumentation - Dr [name] 04/11/20

## 2020-05-31 NOTE — Assessment & Plan Note (Signed)
On Vit D 

## 2020-05-31 NOTE — Progress Notes (Signed)
Subjective:  Patient ID: Adam Vaughn, male    DOB: 12-27-1975  Age: 44 y.o. MRN: [id number]  CC: Follow-up (f/u meds and c/o having RT ankle pain and sinus clogged at night. no OTC meds taken. declines flu/covid shots. )   HPI Adam Vaughn presents for LBP - not better: on Oxy and Diazepam RLE is better C/o constipation - using enemas  S/p L4-5, L5-S1 degenerative disc disease with significant foraminal stenosis and chronic radiculopathy.  Plan bilateral L4-5 and L5-S1 decompressive laminotomies and foraminotomies with interbody fusion and posterior lateral fusion utilizing local autograft and instrumentation - Dr [name] 04/11/20  Outpatient Medications Prior to Visit  Medication Sig Dispense Refill  . Cholecalciferol (VITAMIN D3) 1.25 MG (50000 UT) CAPS Take 1 capsule by mouth once a week. (Patient taking differently: Take 50,000 mg by mouth once a week. Sun) 8 capsule 0  . diazepam (VALIUM) 5 MG tablet Take 1-2 tablets (5-10 mg total) by mouth every 6 (six) hours as needed for muscle spasms. 30 tablet 0  . esomeprazole (NEXIUM) 40 MG capsule Take 1 capsule (40 mg total) by mouth daily. 90 capsule 3  . oxyCODONE 10 MG TABS Take 1 tablet (10 mg total) by mouth every 3 (three) hours as needed for severe pain ((score 7 to 10)). 40 tablet 0  . gabapentin (NEURONTIN) 300 MG capsule Take 1 capsule (300 mg total) by mouth 2 (two) times daily. (Patient not taking: Reported on 05/31/2020) 60 capsule 3  . oxymetazoline (MUCINEX NASAL SPRAY FULL FORCE) 0.05 % nasal spray Place 1-2 sprays into both nostrils at bedtime. (Patient not taking: Reported on 05/31/2020)    . tadalafil (CIALIS) 20 MG tablet Take 1 tablet (20 mg total) by mouth daily as needed for erectile dysfunction. (Patient not taking: Reported on 03/29/2020) 10 tablet 0   No facility-administered medications prior to visit.    ROS: Review of Systems  Constitutional: Negative for appetite change, fatigue and unexpected weight  change.  HENT: Negative for congestion, nosebleeds, sneezing, sore throat and trouble swallowing.   Eyes: Negative for itching and visual disturbance.  Respiratory: Negative for cough.   Cardiovascular: Negative for chest pain, palpitations and leg swelling.  Gastrointestinal: Negative for abdominal distention, blood in stool, diarrhea and nausea.  Genitourinary: Negative for frequency and hematuria.  Musculoskeletal: Positive for back pain and gait problem. Negative for joint swelling and neck pain.  Skin: Negative for rash.  Neurological: Negative for dizziness, tremors, speech difficulty and weakness.  Psychiatric/Behavioral: Negative for agitation, dysphoric mood and sleep disturbance. The patient is not nervous/anxious.     Objective:  BP 126/72   Pulse 85   Temp 98.2 F (36.8 C) (Oral)   Ht 5\' 8"  (1.727 m)   Wt 204 lb 6.4 oz (92.7 kg)   SpO2 98%   BMI 31.08 kg/m   BP Readings from Last 3 Encounters:  05/31/20 126/72  04/13/20 97/63  04/07/20 118/81    Wt Readings from Last 3 Encounters:  05/31/20 204 lb 6.4 oz (92.7 kg)  04/11/20 210 lb (95.3 kg)  04/07/20 210 lb (95.3 kg)    Physical Exam Constitutional:      General: He is not in acute distress.    Appearance: He is well-developed.     Comments: NAD  Eyes:     Conjunctiva/sclera: Conjunctivae normal.     Pupils: Pupils are equal, round, and reactive to light.  Neck:     Thyroid: No thyromegaly.     Vascular: No  JVD.  Cardiovascular:     Rate and Rhythm: Normal rate and regular rhythm.     Heart sounds: Normal heart sounds. No murmur heard.  No friction rub. No gallop.   Pulmonary:     Effort: Pulmonary effort is normal. No respiratory distress.     Breath sounds: Normal breath sounds. No wheezing or rales.  Chest:     Chest wall: No tenderness.  Abdominal:     General: Bowel sounds are normal. There is no distension.     Palpations: Abdomen is soft. There is no mass.     Tenderness: There is no  abdominal tenderness. There is no guarding or rebound.  Musculoskeletal:        General: No tenderness. Normal range of motion.     Cervical back: Normal range of motion.  Lymphadenopathy:     Cervical: No cervical adenopathy.  Skin:    General: Skin is warm and dry.     Findings: No rash.  Neurological:     Mental Status: He is alert and oriented to person, place, and time.     Cranial Nerves: No cranial nerve deficit.     Motor: Weakness present. No abnormal muscle tone.     Coordination: Coordination abnormal.     Gait: Gait abnormal.     Deep Tendon Reflexes: Reflexes are normal and symmetric.  Psychiatric:        Behavior: Behavior normal.        Thought Content: Thought content normal.        Judgment: Judgment normal.    Back brace Cane  Lab Results  Component Value Date   WBC 7.4 04/07/2020   HGB 14.7 04/07/2020   HCT 43.0 04/07/2020   PLT 258 04/07/2020   GLUCOSE 103 (H) 04/07/2020   ALT 54 (H) 01/14/2020   AST 23 01/14/2020   NA 137 04/07/2020   K 3.9 04/07/2020   CL 107 04/07/2020   CREATININE 0.92 04/07/2020   BUN 14 04/07/2020   CO2 21 (L) 04/07/2020   TSH 1.26 01/14/2020    No results found.  Assessment & Plan:   Walker Kehr, MD

## 2020-05-31 NOTE — Assessment & Plan Note (Signed)
On opioids post-op Miralax, Dulcolax

## 2020-05-31 NOTE — Assessment & Plan Note (Signed)
IM inj Start B12 SL

## 2020-05-31 NOTE — Assessment & Plan Note (Signed)
X ray Elastic brace

## 2020-06-01 ENCOUNTER — Telehealth: Payer: Self-pay | Admitting: Internal Medicine

## 2020-06-01 NOTE — Telephone Encounter (Signed)
    Patient calling to report b12 injection on 11/30 made him feel weak, nauseous and headache . He states he feels better today  Please advise

## 2020-06-02 NOTE — Telephone Encounter (Signed)
I am sorry.  The injection should make him feel stronger over time.  Thanks

## 2020-06-03 NOTE — Telephone Encounter (Signed)
Notified pt w/MD response.../lmb 

## 2020-06-07 NOTE — Telephone Encounter (Signed)
Patient stated he did not receive the VM regarding what he should do for his side effects from the B12.   He has not taken any of the prescribed medicine due to being unsure of the effects.   Please give him a call back to advise at [phone]

## 2020-06-09 ENCOUNTER — Ambulatory Visit
Admission: RE | Admit: 2020-06-09 | Discharge: 2020-06-09 | Disposition: A | Discharge status: Home / Self Care | Payer: 59 | Source: Ambulatory Visit | Attending: Neurosurgery | Admitting: Neurosurgery

## 2020-06-09 DIAGNOSIS — M5416 Radiculopathy, lumbar region: Secondary | ICD-10-CM

## 2020-06-23 ENCOUNTER — Other Ambulatory Visit: Payer: Self-pay

## 2020-06-23 ENCOUNTER — Ambulatory Visit: Payer: 59 | Admitting: Internal Medicine

## 2020-06-23 ENCOUNTER — Encounter: Payer: Self-pay | Admitting: Internal Medicine

## 2020-06-23 DIAGNOSIS — G8929 Other chronic pain: Secondary | ICD-10-CM

## 2020-06-23 DIAGNOSIS — M25569 Pain in unspecified knee: Secondary | ICD-10-CM | POA: Insufficient documentation

## 2020-06-23 DIAGNOSIS — M25561 Pain in right knee: Secondary | ICD-10-CM | POA: Diagnosis not present

## 2020-06-23 DIAGNOSIS — M25562 Pain in left knee: Secondary | ICD-10-CM

## 2020-06-23 DIAGNOSIS — E538 Deficiency of other specified B group vitamins: Secondary | ICD-10-CM | POA: Diagnosis not present

## 2020-06-23 DIAGNOSIS — E559 Vitamin D deficiency, unspecified: Secondary | ICD-10-CM | POA: Diagnosis not present

## 2020-06-23 DIAGNOSIS — M5416 Radiculopathy, lumbar region: Secondary | ICD-10-CM | POA: Diagnosis not present

## 2020-06-23 LAB — COMPREHENSIVE METABOLIC PANEL
ALT: 37 U/L (ref 0–53)
AST: 19 U/L (ref 0–37)
Albumin: 4.5 g/dL (ref 3.5–5.2)
Alkaline Phosphatase: 94 U/L (ref 39–117)
BUN: 11 mg/dL (ref 6–23)
CO2: 29 mEq/L (ref 19–32)
Calcium: 9.7 mg/dL (ref 8.4–10.5)
Chloride: 105 mEq/L (ref 96–112)
Creatinine, Ser: 0.88 mg/dL (ref 0.40–1.50)
GFR: 104.35 mL/min (ref >60.00)
Glucose, Bld: 115 mg/dL — ABNORMAL HIGH (ref 70–99)
Potassium: 4.2 mEq/L (ref 3.5–5.1)
Sodium: 139 mEq/L (ref 135–145)
Total Bilirubin: 0.5 mg/dL (ref 0.2–1.2)
Total Protein: 7.3 g/dL (ref 6.0–8.3)

## 2020-06-23 NOTE — Progress Notes (Signed)
Subjective:  Patient ID: Adam Vaughn, male    DOB: 04-03-1976  Age: 44 y.o. MRN: [id number]  CC: Back Pain   HPI Royd Dewing presents for B knee pain L>>R. No injury. Pain is severe C/o LBP post-op pain, gait issues. Not in PT yet  Outpatient Medications Prior to Visit  Medication Sig Dispense Refill  . Cholecalciferol (VITAMIN D3) 50 MCG (2000 UT) capsule Take 1 capsule (2,000 Units total) by mouth daily. 100 capsule 3  . Cyanocobalamin (VITAMIN B-12) 1000 MCG SUBL Place 1 tablet (1,000 mcg total) under the tongue daily. 100 tablet 3  . diazepam (VALIUM) 5 MG tablet Take 1-2 tablets (5-10 mg total) by mouth every 6 (six) hours as needed for muscle spasms. 30 tablet 0  . esomeprazole (NEXIUM) 40 MG capsule Take 1 capsule (40 mg total) by mouth daily. 90 capsule 3  . oxyCODONE 10 MG TABS Take 1 tablet (10 mg total) by mouth every 3 (three) hours as needed for severe pain ((score 7 to 10)). 40 tablet 0  . polyethylene glycol powder (GLYCOLAX/MIRALAX) 17 GM/SCOOP powder Take 17 g by mouth 2 (two) times daily as needed for moderate constipation. 500 g 3  . tadalafil (CIALIS) 20 MG tablet Take 1 tablet (20 mg total) by mouth daily as needed for erectile dysfunction. (Patient not taking: Reported on 03/29/2020) 10 tablet 0  . oxymetazoline (MUCINEX NASAL SPRAY FULL FORCE) 0.05 % nasal spray Place 1-2 sprays into both nostrils at bedtime. (Patient not taking: No sig reported)     No facility-administered medications prior to visit.    ROS: Review of Systems  Constitutional: Negative for appetite change, fatigue and unexpected weight change.  HENT: Negative for congestion, nosebleeds, sneezing, sore throat and trouble swallowing.   Eyes: Negative for itching and visual disturbance.  Respiratory: Negative for cough.   Cardiovascular: Negative for chest pain, palpitations and leg swelling.  Gastrointestinal: Negative for abdominal distention, blood in stool, diarrhea and nausea.   Genitourinary: Negative for frequency and hematuria.  Musculoskeletal: Positive for arthralgias, back pain and gait problem. Negative for joint swelling and neck pain.  Skin: Negative for rash.  Neurological: Negative for dizziness, tremors, speech difficulty and weakness.  Psychiatric/Behavioral: Negative for agitation, dysphoric mood, sleep disturbance and suicidal ideas. The patient is not nervous/anxious.     Objective:  BP (!) 142/88 (BP Location: Left Arm)   Pulse 87   Temp 98.4 F (36.9 C) (Oral)   Wt 204 lb (92.5 kg)   BMI 31.02 kg/m   BP Readings from Last 3 Encounters:  06/23/20 (!) 142/88  05/31/20 126/72  04/13/20 97/63    Wt Readings from Last 3 Encounters:  06/23/20 204 lb (92.5 kg)  05/31/20 204 lb 6.4 oz (92.7 kg)  04/11/20 210 lb (95.3 kg)    Physical Exam Constitutional:      General: He is not in acute distress.    Appearance: He is well-developed.     Comments: NAD  HENT:     Mouth/Throat:     Mouth: Oropharynx is clear and moist.  Eyes:     Conjunctiva/sclera: Conjunctivae normal.     Pupils: Pupils are equal, round, and reactive to light.  Neck:     Thyroid: No thyromegaly.     Vascular: No JVD.  Cardiovascular:     Rate and Rhythm: Normal rate and regular rhythm.     Pulses: Intact distal pulses.     Heart sounds: Normal heart sounds. No murmur heard. No  friction rub. No gallop.   Pulmonary:     Effort: Pulmonary effort is normal. No respiratory distress.     Breath sounds: Normal breath sounds. No wheezing or rales.  Chest:     Chest wall: No tenderness.  Abdominal:     General: Bowel sounds are normal. There is no distension.     Palpations: Abdomen is soft. There is no mass.     Tenderness: There is no abdominal tenderness. There is no guarding or rebound.  Musculoskeletal:        General: Tenderness present. No edema. Normal range of motion.     Cervical back: Normal range of motion.  Lymphadenopathy:     Cervical: No cervical  adenopathy.  Skin:    General: Skin is warm and dry.     Findings: No rash.  Neurological:     Mental Status: He is alert and oriented to person, place, and time.     Cranial Nerves: No cranial nerve deficit.     Motor: Weakness present. No abnormal muscle tone.     Coordination: He displays a negative Romberg sign. Coordination normal.     Gait: Gait abnormal.     Deep Tendon Reflexes: Reflexes are normal and symmetric.  Psychiatric:        Mood and Affect: Mood and affect normal.        Behavior: Behavior normal.        Thought Content: Thought content normal.        Judgment: Judgment normal.   Back brace, L knee brace Pain w/B knee ROM  Lab Results  Component Value Date   WBC 7.4 04/07/2020   HGB 14.7 04/07/2020   HCT 43.0 04/07/2020   PLT 258 04/07/2020   GLUCOSE 103 (H) 04/07/2020   ALT 54 (H) 01/14/2020   AST 23 01/14/2020   NA 137 04/07/2020   K 3.9 04/07/2020   CL 107 04/07/2020   CREATININE 0.92 04/07/2020   BUN 14 04/07/2020   CO2 21 (L) 04/07/2020   TSH 1.26 01/14/2020    CT LUMBAR SPINE WO CONTRAST  Result Date: 06/09/2020 CLINICAL DATA:  Low back pain. Lumbar radiculopathy. Lumbar fusion 04/11/2020 EXAM: CT LUMBAR SPINE WITHOUT CONTRAST TECHNIQUE: Multidetector CT imaging of the lumbar spine was performed without intravenous contrast administration. Multiplanar CT image reconstructions were also generated. COMPARISON:  Lumbar MRI 02/13/2020 FINDINGS: Segmentation: Normal Alignment: Normal Vertebrae: Negative for fracture or mass. Hemangioma L1 vertebral body. Paraspinal and other soft tissues: Postsurgical edema in the posterior soft tissues in the lumbar spine. No fluid collection. No paraspinous mass or adenopathy. Disc levels: T12-L1: Negative L1-2: Mild disc bulging.  Negative for stenosis L2-3: Diffuse disc bulging and mild facet degeneration. Mild narrowing of the spinal canal. L3-4: Moderate disc bulging. Diffuse endplate spurring. Mild facet degeneration  with mild to moderate spinal stenosis. Moderate subarticular stenosis bilaterally L4-5: Decompressive laminectomy. Pedicle screw and interbody fusion. Hardware in good position. Negative for stenosis L5-S1: Decompressive laminectomy. Bilateral pedicle screw and interbody fusion. Negative for spinal or foraminal stenosis. IMPRESSION: Satisfactory pedicle screw and interbody fusion L4-5 and L5-S1 Mild to moderate spinal stenosis L3-4 with moderate subarticular stenosis bilaterally. Electronically Signed   By: Franchot Gallo M.D.   On: 06/09/2020 16:25    Assessment & Plan:    Walker Kehr, MD

## 2020-06-23 NOTE — Assessment & Plan Note (Signed)
Worse L>>R probable OA Voltaren gel CBD cream Sports Med ref

## 2020-06-23 NOTE — Assessment & Plan Note (Signed)
Cont w/Vit D 

## 2020-06-23 NOTE — Patient Instructions (Signed)
   B-complex with Niacin 100 mg    Lion's mane  

## 2020-06-23 NOTE — Assessment & Plan Note (Signed)
On SL Vit B12 

## 2020-06-23 NOTE — Assessment & Plan Note (Signed)
Chronic pain Recovering post-op F/u w/Dr [name]

## 2020-06-24 LAB — RHEUMATOID FACTOR: Rheumatoid fact SerPl-aCnc: <14 IU/mL (ref <14)

## 2020-06-27 NOTE — Progress Notes (Signed)
Subjective:   I, Adam Vaughn, LAT, ATC acting as a scribe for Adam Leader, MD.  I'm seeing this patient as a consultation for Adam Dawes, MD. Note will be routed back to referring provider/PCP.  CC: Chronic bilateral knee pain  HPI: Pt is a 44yo male c/o bilat knee pain, L>R. Pt reports pain has been ongoing for a couple month w/ no known MOI, but has worsened over the last couple weeks. Pt locates pain to deep in joint, behind patella.  Pt reports increased pain after back surgery which was 04/11/20. Pt c/o tightness in bilat calves and L ankle burning and tingling. Pt c/o numbness/tingling coming from L buttock and down his leg. Pt c/o inability to sleep at night due to pain.  He is also having bilateral ankle pain.  He notes is difficult for him to tell how much of the pain is coming from his lumbar radiculopathy Aggravates: bed, squatting,  Rx tried: Bengay and knee braces, oxycodone- relieves back pain, but not knees or ankles  Dx imaging: L-spine CT  Past medical history, Surgical history, Family history, Social history, Allergies, and medications have been entered into the medical record, reviewed. Posterior interbody fusion and decompression L4-L5-S1 Dr. Trenton Gammon April 11, 2020  Review of Systems: No new headache, visual changes, nausea, vomiting, diarrhea, constipation, dizziness, abdominal pain, skin rash, fevers, chills, night sweats, weight loss, swollen lymph nodes, body aches, joint swelling, muscle aches, chest pain, shortness of breath, mood changes, visual or auditory hallucinations.   Objective:    Vitals:   06/28/20 0851  BP: 120/84  Pulse: 79  SpO2: 98%   General: Well Developed, well nourished, and in no acute distress.  Neuro/Psych: Alert and oriented x3, extra-ocular muscles intact, able to move all 4 extremities, sensation grossly intact. Skin: Warm and dry, no rashes noted.  Respiratory: Not using accessory muscles, speaking in full sentences,  trachea midline.  Cardiovascular: Pulses palpable, no extremity edema. Abdomen: Does not appear distended. MSK:  Right knee mild effusion normal-appearing otherwise.  Range of motion 3-100 degrees with crepitation. Diffusely tender to palpation worse at medial joint line. Stable ligamentous exam. Negative McMurray test. Diminished strength with guarding to flexion and extension.  Left knee: Mild effusion.  Mature scar anterior knee overlying distal quad tendon. Range of motion 3-100 degrees with crepitation. Diffusely tender. Stable given his exam. Negative Murray's test. Diminished hearing with guarding to flexion and extension.  Right ankle normal-appearing with no effusion mass or deformity. Normal motion. Diffusely tender along anterior aspect of ankle. Stable ligamentous exam. Intact strength.  Left ankle normal-appearing with no effusion mass or deformity. Normal motion. Diffusely tender anterior aspect of ankle. Stable ligamentous exam. Intact strength.  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  No acute fracture.  Left knee: Mild DJD medial compartment and patellofemoral compartment.  No acute fracture.  Left ankle: Mild degenerative changes.  No acute fracture.  Await formal radiology review  EXAM: RIGHT ANKLE - COMPLETE 3+ VIEW  COMPARISON:  None  FINDINGS: The ankle mortise is maintained. No acute ankle fracture. No osteochondral lesion. No joint effusion. The mid and hindfoot bony structures are intact.  IMPRESSION: No acute bony findings.   Electronically Signed   By: Marijo Sanes M.D.   On: 05/31/2020 17:00 I, Adam Vaughn, personally (independently) visualized and performed the interpretation of the images attached in  this note.  Diagnostic Limited MSK Ultrasound of: Bilateral knees  and ankles  Left knee: Quad tendon intact  Small joint effusion. Patellar tendon intact. Mild narrowing medial and lateral joint line. Posterior knee no Baker's cyst.  Right knee: Quad tendon intact over area of disorganization quad tendon near distal tendon insertion indicates either prior injury or congenital malformation.  No acute changes present. Small joint effusion. Patellar tendon intact. Mild narrowing medial and lateral joint line. Posterior knee no Baker's cyst.  Left ankle: Trace joint effusion.  No significant derangement seen along anterior medial and lateral ankle.  Right ankle: Trace joint effusion.  No significant derangements along anterior to medial and lateral ankle.  Impression: Mild degenerative changes bilateral knees with mild joint effusion.  Ankles largely normal-appearing ultrasound.  Procedure: Real-time Ultrasound Guided Injection of right knee superior lateral patellar space Device: Philips Affiniti 50G Images permanently stored and available for review in PACS Verbal informed consent obtained.  Discussed risks and benefits of procedure. Warned about infection bleeding damage to structures skin hypopigmentation and fat atrophy among others. Patient expresses understanding and agreement Time-out conducted.   Noted no overlying erythema, induration, or other signs of local infection.   Skin prepped in a sterile fashion.   Local anesthesia: Topical Ethyl chloride.   With sterile technique and under real time ultrasound guidance:  40 mg of Kenalog and 2 L of Marcaine injected into knee joint. Fluid seen entering the joint capsule.   Completed without difficulty   Pain partially resolved suggesting accurate placement of the medication.   Advised to call if fevers/chills, erythema, induration, drainage, or persistent bleeding.   Images permanently stored and available for review in the ultrasound unit.  Impression: Technically successful ultrasound guided  injection.    Procedure: Real-time Ultrasound Guided Injection of left knee superior lateral patellar space Device: Philips Affiniti 50G Images permanently stored and available for review in PACS Verbal informed consent obtained.  Discussed risks and benefits of procedure. Warned about infection bleeding damage to structures skin hypopigmentation and fat atrophy among others. Patient expresses understanding and agreement Time-out conducted.   Noted no overlying erythema, induration, or other signs of local infection.   Skin prepped in a sterile fashion.   Local anesthesia: Topical Ethyl chloride.   With sterile technique and under real time ultrasound guidance:  40 mg of Kenalog and 2 L of Marcaine injected into knee. Fluid seen entering the joint capsule.   Completed without difficulty   Pain partially resolved suggesting accurate placement of the medication.   Advised to call if fevers/chills, erythema, induration, drainage, or persistent bleeding.   Images permanently stored and available for review in the ultrasound unit.  Impression: Technically successful ultrasound guided injection.     Impression and Recommendations:    Assessment and Plan: 44 y.o. male with bilateral knee and ankle pain.  Multifactorial.  Patient had pretty significant lumbar radiculopathy and ultimately decompressive surgery in October.  He continues to experience significant and bothersome leg pain.  Knee pain: Multifactorial.  Although he does have degenerative changes and effusion which are contributing to his pain I do believe some of the pain he experiences in and around the knee is very likely to be due to lumbar radiculopathy.  Plan for steroid injection as above, and Voltaren gel.  If not improving consider ultimately MRI to further evaluate cause of the pain.  For radicular pain management see below.  Ankle pain bilateral.  More likely to be related to remaining  radiculopathy.  If not improving with  radicular treatment as below may consider trial of steroid injection bilateral ankles.  Would have to wait at least a few weeks since his knee injection.  I do think he will benefit from physical therapy for his knee and his ankle pain.  However according to patient his neurosurgeon wants him to wait until February 16 until he is safe to proceed with physical therapy.  As for his persistent radicular pain he is currently taking Lyrica 75 mg twice daily.  I think it could be increased to 150 mg twice daily.  I have written a prescription for this.  We will see if that helps any.  Check back with me in 1 month.  CC Dr. [name]  PDMP not reviewed this encounter. Orders Placed This Encounter  Procedures  . [place] LIMITED JOINT SPACE STRUCTURES LOW BILAT(NO LINKED CHARGES)    Standing Status:   Future    Number of Occurrences:   1    Standing Expiration Date:   12/27/2020    Order Specific Question:   Reason for Exam (SYMPTOM  OR DIAGNOSIS REQUIRED)    Answer:   chronic bilateral knee pain    Order Specific Question:   Preferred imaging location?    Answer:   [place]  . DG Knee AP/LAT W/[place] Right    Standing Status:   Future    Number of Occurrences:   1    Standing Expiration Date:   06/28/2021    Order Specific Question:   Reason for Exam (SYMPTOM  OR DIAGNOSIS REQUIRED)    Answer:   chronic bilateral knee pain    Order Specific Question:   Preferred imaging location?    Answer:   [facility]  . DG Knee AP/LAT W/[place] Left    Standing Status:   Future    Number of Occurrences:   1    Standing Expiration Date:   06/28/2021    Order Specific Question:   Reason for Exam (SYMPTOM  OR DIAGNOSIS REQUIRED)    Answer:   chronic bilateral knee pain    Order Specific Question:   Preferred imaging location?    Answer:   [facility]  . DG Ankle Complete Left    Standing Status:   Future    Number of Occurrences:   1    Standing Expiration Date:    06/28/2021    Order Specific Question:   Reason for Exam (SYMPTOM  OR DIAGNOSIS REQUIRED)    Answer:   eval ankle pain    Order Specific Question:   Preferred imaging location?    Answer:   [facility]   Meds ordered this encounter  Medications  . pregabalin (LYRICA) 150 MG capsule    Sig: Take 1 capsule (150 mg total) by mouth 2 (two) times daily.    Dispense:  60 capsule    Refill:  2    Discussed warning signs or symptoms. Please see discharge instructions. Patient expresses understanding.   The above documentation has been reviewed and is accurate and complete Adam Vaughn, M.D.

## 2020-06-28 ENCOUNTER — Ambulatory Visit (INDEPENDENT_AMBULATORY_CARE_PROVIDER_SITE_OTHER): Payer: 59 | Admitting: Family Medicine

## 2020-06-28 ENCOUNTER — Other Ambulatory Visit: Payer: Self-pay

## 2020-06-28 ENCOUNTER — Telehealth: Payer: Self-pay | Admitting: Family Medicine

## 2020-06-28 ENCOUNTER — Ambulatory Visit: Payer: Self-pay

## 2020-06-28 ENCOUNTER — Ambulatory Visit (INDEPENDENT_AMBULATORY_CARE_PROVIDER_SITE_OTHER): Payer: 59

## 2020-06-28 VITALS — BP 120/84 | HR 79 | Ht 68.0 in | Wt 207.6 lb

## 2020-06-28 DIAGNOSIS — G8929 Other chronic pain: Secondary | ICD-10-CM | POA: Diagnosis not present

## 2020-06-28 DIAGNOSIS — M25562 Pain in left knee: Secondary | ICD-10-CM | POA: Diagnosis not present

## 2020-06-28 DIAGNOSIS — M48062 Spinal stenosis, lumbar region with neurogenic claudication: Secondary | ICD-10-CM

## 2020-06-28 DIAGNOSIS — M25572 Pain in left ankle and joints of left foot: Secondary | ICD-10-CM

## 2020-06-28 DIAGNOSIS — M25571 Pain in right ankle and joints of right foot: Secondary | ICD-10-CM

## 2020-06-28 DIAGNOSIS — M25561 Pain in right knee: Secondary | ICD-10-CM | POA: Diagnosis not present

## 2020-06-28 MED ORDER — PREGABALIN 150 MG PO CAPS: 150.0000 mg | ORAL_CAPSULE | Freq: Two times a day (BID) | ORAL | 2 refills | Status: DC

## 2020-06-28 NOTE — Progress Notes (Signed)
Left ankle x-ray looks normal to radiology.

## 2020-06-28 NOTE — Progress Notes (Signed)
Left knee x-ray looks normal to radiology.  I saw a little bit of arthritis on my independent evaluation.

## 2020-06-28 NOTE — Progress Notes (Signed)
Right knee x-ray looks normal to radiology.  I also saw a little bit of arthritis on my own evaluation.

## 2020-06-28 NOTE — Telephone Encounter (Signed)
Patient was seen this morning. He called to let Dr Denyse Amass know that he knees are already feeling a little better. They are a bit sore still when he bends them but already improving. He also said that his left ankle does not hurt at all. He was very appreciative and wanted to thank Dr Denyse Amass for taking the time to talk to him and explain everything to him.

## 2020-06-28 NOTE — Patient Instructions (Signed)
Thank you for coming in today.  Please get an Xray today before you leave  Call or go to the ER if you develop a large red swollen joint with extreme pain or oozing puss.   Increase the lyrica to 150mg  twice daily.   Recheck in 1 month.   Plan to start PT ASAP cleared by Dr .

## 2020-07-02 HISTORY — PX: SPINAL CORD STIMULATOR REMOVAL: SHX2423

## 2020-07-02 HISTORY — PX: SPINAL CORD STIMULATOR IMPLANT: SHX2422

## 2020-07-12 ENCOUNTER — Ambulatory Visit: Payer: 59 | Admitting: Internal Medicine

## 2020-07-14 ENCOUNTER — Ambulatory Visit: Payer: 59 | Admitting: Internal Medicine

## 2020-07-28 NOTE — Progress Notes (Signed)
I, [name], LAT, ATC, am serving as scribe for Dr. [name].  Joseth Weigel is a 45 y.o. male who presents to [place] at [facility] today for f/u of B knee and B ankle pain.  He was last seen by Dr. Georgina Snell on 06/28/20 and had B knee injections.  He was prescribed Lyrica and was advised to check with his neurosurgeon Dr. [name] regarding PT.  Since his last visit, pt reports bilat knee pain, R>L. Pt locates knee pain to the anterior aspect when squatting. Pt c/o "crunching" noises is R knee. Pt reports 60% improvement from last injection that lasted for about 3 weeks.  Pt also c/o bilat ankle pain. Pt locates ankle pain to the lateral aspect and the talocrural/anterior aspect.  Patient also in the process of receiving epidural steroid injections as part of pain management.  We will start PT arranged by neurosurgery in February.  Diagnostic testing: L ankle XR and B knee XRs- 06/28/20; L-spine CT- 06/09/20; R ankle XR- 05/31/20  Pertinent review of systems: No fevers or chills  Relevant historical information: Recent back surgery.   Exam:  BP 112/78 (BP Location: Right Arm, Patient Position: Sitting, Cuff Size: Normal)   Pulse 96   Ht 5\' 8"  (1.727 m)   Wt 205 lb (93 kg)   SpO2 97%   BMI 31.17 kg/m  General: Well Developed, well nourished, and in no acute distress.   MSK: Knees bilaterally normal motion with crepitation.  Decreased VMO bulk.    Lab and Radiology Results  EXAM: LEFT KNEE 3 VIEWS; RIGHT KNEE 3 VIEWS  COMPARISON:  None.  FINDINGS: No evidence of fracture, dislocation, or joint effusion. No evidence of arthropathy or other focal bone abnormality. No chondrocalcinosis. Soft tissues are unremarkable.  IMPRESSION: Negative exam.   Electronically Signed   By: Inge Rise M.D.   On: 06/28/2020 09:59  EXAM: LEFT KNEE 3 VIEWS; RIGHT KNEE 3 VIEWS  COMPARISON:  None.  FINDINGS: No evidence of fracture, dislocation, or  joint effusion. No evidence of arthropathy or other focal bone abnormality. No chondrocalcinosis. Soft tissues are unremarkable.  IMPRESSION: Negative exam.   Electronically Signed   By: Inge Rise M.D.   On: 06/28/2020 09:59  I, [name], personally (independently) visualized and performed the interpretation of the images attached in this note.    Assessment and Plan: 45 y.o. male with bilateral knee pain and bilateral leg pain.  Multifactorial.  A great deal of the leg pain is residual nerve.  However he does have some knee pain thought to be due to patellofemoral source.  He had moderate but short-term relief with steroid injection.  We will work on hyaluronic acid injection authorization now.  He is a good candidate for physical therapy which can start in February.  His neurosurgeon will order PT to start February 16.  He will let me know which location he is going to and I will add on quad strengthening exercises to PT.  In the meantime showed him some simple home exercise programs he can start now.  Recheck back in 2 months or sooner for hyaluronic acid injections.   PDMP not reviewed this encounter. No orders of the defined types were placed in this encounter.  No orders of the defined types were placed in this encounter.    Discussed warning signs or symptoms. Please see discharge instructions. Patient expresses understanding.   The above documentation has been reviewed and is accurate and complete  [name], M.D.

## 2020-07-29 ENCOUNTER — Other Ambulatory Visit: Payer: Self-pay

## 2020-07-29 ENCOUNTER — Ambulatory Visit: Payer: Self-pay

## 2020-07-29 ENCOUNTER — Ambulatory Visit (INDEPENDENT_AMBULATORY_CARE_PROVIDER_SITE_OTHER): Payer: 59 | Admitting: Family Medicine

## 2020-07-29 VITALS — BP 112/78 | HR 96 | Ht 68.0 in | Wt 205.0 lb

## 2020-07-29 DIAGNOSIS — M25561 Pain in right knee: Secondary | ICD-10-CM

## 2020-07-29 DIAGNOSIS — M25562 Pain in left knee: Secondary | ICD-10-CM

## 2020-07-29 DIAGNOSIS — G8929 Other chronic pain: Secondary | ICD-10-CM

## 2020-07-29 DIAGNOSIS — M170 Bilateral primary osteoarthritis of knee: Secondary | ICD-10-CM

## 2020-07-29 DIAGNOSIS — M48062 Spinal stenosis, lumbar region with neurogenic claudication: Secondary | ICD-10-CM | POA: Diagnosis not present

## 2020-07-29 NOTE — Patient Instructions (Addendum)
Thank you for coming in today.  The word for strange nerve sensation is paresthesia.   Work on Chartered certified [employer].   Let me know where Dr [name] sends you for PT.  I will add on quad strength for your knee pain as well.   I will also work on gel shots.   Recheck in 2 months.

## 2020-08-26 ENCOUNTER — Telehealth: Payer: Self-pay

## 2020-08-26 DIAGNOSIS — G8929 Other chronic pain: Secondary | ICD-10-CM

## 2020-08-26 DIAGNOSIS — M48062 Spinal stenosis, lumbar region with neurogenic claudication: Secondary | ICD-10-CM

## 2020-08-26 NOTE — Telephone Encounter (Signed)
Received a denial for Gel injections because patient does not meet all the criteria for coverage, therefore not deemed medically necessary.   1) no documentation of arthritis on imaging 2) no documentation of a failure both pharmacological and non phagologic treatment.

## 2020-08-31 NOTE — Telephone Encounter (Signed)
Patient called back. I gave him Dr Adam Vaughn recommendations. He said that he would like to continue with PT.

## 2020-08-31 NOTE — Telephone Encounter (Signed)
Please inform patient that hyaluronic acid injections have been denied by insurance.  Patient does not have enough arthritis seen on x-ray to prove hyaluronic acid.  Physical therapy is next best recommendation.  Would you like me to order them?

## 2020-08-31 NOTE — Addendum Note (Signed)
Addended by: Gregor Hams on: 08/31/2020 03:15 PM   Modules accepted: Orders

## 2020-08-31 NOTE — Telephone Encounter (Signed)
Physical therapy ordered

## 2020-08-31 NOTE — Telephone Encounter (Signed)
Called pt but unable to LM due to voicemail box being full.

## 2020-09-01 NOTE — Telephone Encounter (Signed)
Called pt but unable to LM due to VM being full.  If pt returns call, please advise him the PT referral has been placed to [facility]  Please give him the the phone number to [facility] [phone]) as he con't to have a full voicemail repeatedly.

## 2020-09-14 ENCOUNTER — Ambulatory Visit: Payer: 59 | Admitting: Rehabilitative and Restorative Service Providers"

## 2020-09-14 ENCOUNTER — Encounter: Payer: Self-pay | Admitting: Rehabilitative and Restorative Service Providers"

## 2020-09-14 ENCOUNTER — Other Ambulatory Visit: Payer: Self-pay

## 2020-09-14 DIAGNOSIS — M545 Low back pain, unspecified: Secondary | ICD-10-CM | POA: Diagnosis not present

## 2020-09-14 DIAGNOSIS — M6281 Muscle weakness (generalized): Secondary | ICD-10-CM | POA: Diagnosis not present

## 2020-09-14 DIAGNOSIS — M25562 Pain in left knee: Secondary | ICD-10-CM

## 2020-09-14 DIAGNOSIS — M25561 Pain in right knee: Secondary | ICD-10-CM

## 2020-09-14 DIAGNOSIS — R262 Difficulty in walking, not elsewhere classified: Secondary | ICD-10-CM

## 2020-09-14 DIAGNOSIS — G8929 Other chronic pain: Secondary | ICD-10-CM

## 2020-09-14 DIAGNOSIS — R293 Abnormal posture: Secondary | ICD-10-CM

## 2020-09-14 NOTE — Therapy (Addendum)
[facility] [address] [place], [place], [address] Phone: [phone]   Fax:  [phone]  Physical Therapy Evaluation/Discharge  Patient Details  Name: Adam Vaughn MRN: [id number] Date of Birth: 18-Jun-1976 Referring Provider (PT): Dr [name]   Encounter Date: 09/14/2020   PT End of Session - 09/14/20 1002     Visit Number 1    Number of Visits 12    Date for PT Re-Evaluation 11/09/20    Progress Note Due on Visit 10    PT Start Time 1015    PT Stop Time 1054    PT Time Calculation (min) 39 min    Activity Tolerance Patient limited by pain    Behavior During Therapy [facility] for tasks assessed/performed             Past Medical History:  Diagnosis Date   Arthritis    GERD (gastroesophageal reflux disease)    History of kidney stones    Hypertension    Kidney stones     Past Surgical History:  Procedure Laterality Date   COLONOSCOPY     TONSILLECTOMY     VASECTOMY      There were no vitals filed for this visit.    Subjective Assessment - 09/14/20 1000     Subjective Pt. indicated complaints in knees and back.  Pt. stated back surgery helped legs some but still notices back complaints.  Pt. stated knees hurt and throb at night, pop and crack and hurt c squatting, standing at times.  Nighttime is usually the worse.  Pt. stated some ankle pains as well.    Pertinent History 04/11/2020 lumbar fusion L4-L5, L5-S1), L3-L4 stenosis on CT.  History of injection to knee and back.  HTN, GERD, history of kidney stones    Limitations Sitting;Lifting;Standing;Walking;House hold activities    Patient Stated Goals Reduce pain, sleeping    Currently in Pain? Yes    Pain Score 5    pain at worst 7/10   Pain Location Knee    Pain Orientation Left;Right    Pain Descriptors / Indicators Aching;Constant    Pain Type Chronic pain    Pain Onset More than a month ago    Pain Frequency Constant    Aggravating Factors  squatting, standing  prolonged, stairs, carrying items.    Pain Relieving Factors rest, icing/heat at times, CBD oil att imes    Effect of Pain on Daily Activities sleeping, squatting, lift/carry    Multiple Pain Sites Yes    Pain Score 7   pain at worst 9/10   Pain Location Back    Pain Orientation Lower    Pain Descriptors / Indicators Sharp;Throbbing;Aching;Constant    Pain Type Chronic pain    Pain Radiating Towards legs, feet burning    Pain Onset More than a month ago    Pain Frequency Constant    Aggravating Factors  sitting prolonged, standing prolonged, nighttime pain   (noted in first of lying down)    Pain Relieving Factors medicine    Effect of Pain on Daily Activities Standing, lifting, bending                [facility] PT Assessment - 09/14/20 0001       Assessment   Medical Diagnosis Bilateral knee pain, low back pain    Referring Provider (PT) Dr [name]    Onset Date/Surgical Date 04/11/20    Hand Dominance Left      Precautions   Precaution Comments  Fusion L4-L5, L5-S1      Restrictions   Weight Bearing Restrictions No      Balance Screen   Has the patient fallen in the past 6 months Yes    How many times? 3   fall on ice, trip on sidewalk, fell getting out of bed   Has the patient had a decrease in activity level because of a fear of falling?  No    Is the patient reluctant to leave their home because of a fear of falling?  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    Leisure basketball prior to complaints, fishing/hunting      Cognition   Overall Cognitive Status Within Functional Limits for tasks assessed      Observation/Other Assessments   Focus on Therapeutic Outcomes (FOTO)  intake 48%, outcome expected 55%      Sensation    Light Touch Appears Intact      Functional Tests   Functional tests Sit to Stand;Single leg stance      Single Leg Stance   Comments Lt SLS 20 seconds, Rt SLS 15 seconds      Sit to Stand   Comments increased difficulty but able to perform c UE on knees from 18 inch chair      Posture/Postural Control   Posture Comments Reduction in lumbar lordosis      ROM / Strength   AROM / PROM / Strength Strength;PROM;AROM      AROM   Overall AROM Comments Hip AROM not tested today due to severity of symptoms in lumbar region    AROM Assessment Site Lumbar;Knee;Hip;Ankle    Right/Left Hip Left;Right    Right/Left Knee Left;Right    Right Knee Extension 0    Right Knee Flexion 134   measured supine   Left Knee Extension 0    Left Knee Flexion 140   posterior Rt knee pain, measured supine   Right/Left Ankle Left;Right    Lumbar Flexion < 25% c ERP lumbar    Lumbar Extension 25% ERP lumbar      PROM   PROM Assessment Site Knee;Hip      Strength   Strength Assessment Site Ankle;Knee;Hip    Right/Left Hip Left;Right    Right Hip Flexion 4/5   back pain   Left Hip Flexion 4/5   back   Right/Left Knee Left;Right    Right Knee Flexion 5/5    Right Knee Extension 4+/5    Left Knee Flexion 4/5   back pain   Left Knee Extension 4/5   back pain   Right/Left Ankle Left;Right    Right Ankle Dorsiflexion 5/5    Right Ankle Inversion 4/5    Right Ankle Eversion 4/5    Left Ankle Dorsiflexion 5/5    Left Ankle Inversion 5/5    Left Ankle Eversion 5/5      Flexibility   Soft Tissue Assessment /Muscle Length yes    Hamstrings passive SLR 40 deg bilateral c posterior leg pulling (no back pain, (-) crossed SLR)      Palpation   Palpation comment Tenderness to touch in lumbar spine near incision, lumbar paraspinals.      Special Tests  Other special tests (+) slump bilateral c production of LBP, posterior LE pain c knee extension only required      Ambulation/Gait   Gait Comments Reduced  stance on Lt LE, trunk lean to Rt in stance in ambulation (not present in standing)                        Objective measurements completed on examination: See above findings.       [facility] Treatment/Exercise - 09/14/20 0001       Neuro Re-ed    Neuro Re-ed Details  Pain neuroscience c education on neural sensitivity in chronic pain presentation, neurodynamic education      Exercises   Exercises Other Exercises;Lumbar    Other Exercises  HEP instruction/performance c trial set of each exercise.  HEP consisting of supine LTR 15 sec x 3 bilateral, supine slr, seated SLR, sciatic nerve flossing c LAQ c PF, performed bilateral                    PT Education - 09/14/20 1002     Education Details HEP, POC, neuro pain science education    Person(s) Educated Patient    Methods Explanation;Demonstration;Verbal cues;Handout    Comprehension Returned demonstration;Verbalized understanding              PT Short Term Goals - 09/14/20 1002       PT SHORT TERM GOAL #1   Title Patient will demonstrate independent use of home exercise program to maintain progress from in clinic treatments.    Time 3    Period Weeks    Status New    Target Date 10/05/20               PT Long Term Goals - 09/14/20 1002       PT LONG TERM GOAL #1   Title Patient will demonstrate/report pain at worst less than or equal to 2/10 to facilitate minimal limitation in daily activity secondary to pain symptoms.    Time 10    Period Weeks    Status New    Target Date 11/23/20      PT LONG TERM GOAL #2   Title Patient will demonstrate independent use of home exercise program to facilitate ability to maintain/progress functional gains from skilled physical therapy services.    Time 10    Period Weeks    Status New    Target Date 11/23/20      PT LONG TERM GOAL #3   Title Patient will demonstrate bilateral LE MMT 5/5 throughout to facilitate ability to perform  usual standing, walking, stairs at PLOF s limitation due to symptoms.    Time 10    Period Weeks    Status New    Target Date 11/23/20      PT LONG TERM GOAL #4   Title Pt. will demonstrate lumbar extension > or = 50%, lumbar flexion to knees to promote improved movement tolerance for self care, dressing movement.    Time 10    Period Weeks    Status New    Target Date 11/23/20      PT LONG TERM GOAL #5   Title Pt. will demonstrate sit to stand, squat s UE assist s symptoms, difficulty to facilitate usual daily and possible work activity at [employer].    Time 10    Period Weeks    Status New    Target Date 11/23/20  Additional Long Term Goals   Additional Long Term Goals Yes      PT LONG TERM GOAL #6   Title Pt. will indicate ability to sleep through night s interruption due to symptoms.    Time 10    Period Weeks    Status New    Target Date 11/23/20      PT LONG TERM GOAL #7   Title Pt. will demonstrate FOTO outcome > or = 55    Time 10    Period Weeks    Status New    Target Date 11/23/20                    Plan - 09/14/20 1003     Clinical Impression Statement Patient is a 45 y.o. male who comes to clinic with multiple pain complaints, noted complaints of bilateral knee pain with mobility, strength and movement coordination deficits and low back pain c mobility deficits, neurodynamic defiicts  that impair their ability to perform usual daily and recreational functional activities without increase difficulty/symptoms at this time.  Patient to benefit from skilled PT services to address impairments and limitations to improve to previous level of function without restriction secondary to condition.    Personal Factors and Comorbidities Past/Current Experience;Comorbidity 2;Other   fusion surgery lumbar   Comorbidities HTN, GERD    Examination-Activity Limitations Bathing;Sit;Sleep;Squat;Bend;Stairs;Stand;Carry;Dressing;Hygiene/Grooming;Locomotion Level;Transfers     Examination-Participation Restrictions Cleaning;Community Activity;Yard Work;Occupation    Stability/Clinical Decision Making Evolving/Moderate complexity    Clinical Decision Making Moderate    Rehab Potential --   fair to good (multiple treatment areas, chronicity)   PT Frequency --   1-2x/week   PT Duration Other (comment)   1o weeks   PT Treatment/Interventions ADLs/Self Care Home Management;Cryotherapy;Functional mobility training;Electrical Stimulation;Iontophoresis 30m/ml Dexamethasone;Therapeutic activities;Therapeutic exercise;Moist Heat;Balance training;Gait training;Stair training;DME Instruction;Ultrasound;Neuromuscular re-education;Patient/family education;Passive range of motion;Joint Manipulations;Dry needling;Taping;Manual techniques;Vasopneumatic Device    PT Next Visit Plan Review existing HEP.  Intervention to promote reduced neural sensitivity within pain tolerance, lumbar/LE mobility gains, LE strengthening.    PT Home Exercise Plan [id number]   Consulted and Agree with Plan of Care Patient             Patient will benefit from skilled therapeutic intervention in order to improve the following deficits and impairments:  Pain,Difficulty walking,Decreased mobility,Decreased balance,Improper body mechanics,Impaired perceived functional ability,Impaired flexibility,Decreased range of motion,Decreased coordination,Decreased strength,Decreased activity tolerance,Hypomobility,Abnormal gait,Decreased endurance,Increased fascial restricitons,Postural dysfunction  Visit Diagnosis: Chronic pain of left knee  Chronic pain of right knee  Chronic bilateral low back pain, unspecified whether sciatica present  Muscle weakness (generalized)  Difficulty in walking, not elsewhere classified  Abnormal posture     Problem List Patient Active Problem List   Diagnosis Date Noted   Knee pain 06/23/2020   Low vitamin B12 level 05/31/2020   Ankle sprain 05/31/2020   Constipation  by delayed colonic transit 05/31/2020   Degenerative spondylolisthesis 04/11/2020   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   DDD (degenerative disc disease), lumbar 01/20/2020   Left lumbar radiculitis 01/20/2020   Vitamin D deficiency 01/15/2020   Low back pain 01/12/2020   IBS (irritable bowel syndrome) 01/12/2020   Nephrolithiasis 01/12/2020   Erectile dysfunction 01/12/2020   GERD (gastroesophageal reflux disease) 01/12/2020   MScot Jun PT, DPT, OCS, ATC 09/14/20  11:09 AM   PHYSICAL THERAPY DISCHARGE SUMMARY  Visits from Start of Care: 1   Current functional level related to goals / functional outcomes: See  note   Remaining deficits: See note   Education / Equipment: HEP   Patient agrees to discharge. Patient goals were not met. Patient is being discharged due to not returning since the last visit.  Scot Jun, PT, DPT, OCS, ATC 12/13/20  3:39 PM    [facility] [address] [place], [place], [address] Phone: [phone]   Fax:  [phone]  Name: Kaedyn Polivka MRN: [id number] Date of Birth: 10-03-1975

## 2020-09-14 NOTE — Patient Instructions (Signed)
Access Code: [email or web address] URL: [email or web address] Date: 09/14/2020 Prepared by: Scot Jun  Exercises Supine Lower Trunk Rotation - 2 x daily - 7 x weekly - 5 reps - 1 sets - 15 hold Seated Long Arc Quad - 2 x daily - 7 x weekly - 10 reps - 3 sets - 2 hold Small Range Straight Leg Raise - 1 x daily - 7 x weekly - 3 sets - 10 reps Seated Straight Leg Heel Taps - 1 x daily - 7 x weekly - 3 sets - 10 reps

## 2020-09-26 ENCOUNTER — Ambulatory Visit: Payer: 59 | Admitting: Family Medicine

## 2020-09-26 ENCOUNTER — Encounter: Payer: Self-pay | Admitting: Internal Medicine

## 2020-09-26 ENCOUNTER — Ambulatory Visit: Payer: 59 | Admitting: Internal Medicine

## 2020-09-26 ENCOUNTER — Other Ambulatory Visit: Payer: Self-pay

## 2020-09-26 DIAGNOSIS — E538 Deficiency of other specified B group vitamins: Secondary | ICD-10-CM | POA: Diagnosis not present

## 2020-09-26 DIAGNOSIS — G4700 Insomnia, unspecified: Secondary | ICD-10-CM | POA: Insufficient documentation

## 2020-09-26 DIAGNOSIS — M48062 Spinal stenosis, lumbar region with neurogenic claudication: Secondary | ICD-10-CM

## 2020-09-26 DIAGNOSIS — M545 Low back pain, unspecified: Secondary | ICD-10-CM | POA: Diagnosis not present

## 2020-09-26 DIAGNOSIS — G8929 Other chronic pain: Secondary | ICD-10-CM

## 2020-09-26 DIAGNOSIS — E559 Vitamin D deficiency, unspecified: Secondary | ICD-10-CM

## 2020-09-26 MED ORDER — FLUTICASONE PROPIONATE 50 MCG/ACT NA SUSP: 2.0000 | Freq: Every day | NASAL | 6 refills | Status: DC

## 2020-09-26 MED ORDER — DULOXETINE HCL 30 MG PO CPEP: 30.0000 mg | ORAL_CAPSULE | Freq: Every evening | ORAL | 5 refills | Status: DC

## 2020-09-26 MED ORDER — NAPROXEN 500 MG PO TABS: 500.0000 mg | ORAL_TABLET | Freq: Two times a day (BID) | ORAL | 3 refills | Status: DC | PRN

## 2020-09-26 NOTE — Assessment & Plan Note (Addendum)
F/u w/Dr [name] On Oxy, Lyrica - Dr [name] Try Cymbalta at hs, Naproxen

## 2020-09-26 NOTE — Assessment & Plan Note (Signed)
On Vit D 

## 2020-09-26 NOTE — Assessment & Plan Note (Signed)
Try Cymbalta at hs

## 2020-09-26 NOTE — Progress Notes (Signed)
Subjective:  Patient ID: Adam Vaughn, male    DOB: [birth year]  Age: 45 y.o. MRN: [id number]  CC: Follow-up (3 month f/u- Req refill on Nexium)   HPI Adam Vaughn presents for LBP - worse C/o insomnia F/u GERD  Outpatient Medications Prior to Visit  Medication Sig Dispense Refill  . Cholecalciferol (VITAMIN D3) 50 MCG (2000 UT) capsule Take 1 capsule (2,000 Units total) by mouth daily. 100 capsule 3  . Cyanocobalamin (VITAMIN B-12) 1000 MCG SUBL Place 1 tablet (1,000 mcg total) under the tongue daily. 100 tablet 3  . esomeprazole (NEXIUM) 40 MG capsule Take 1 capsule (40 mg total) by mouth daily. 90 capsule 3  . oxyCODONE 10 MG TABS Take 1 tablet (10 mg total) by mouth every 3 (three) hours as needed for severe pain ((score 7 to 10)). 40 tablet 0  . pregabalin (LYRICA) 150 MG capsule Take 1 capsule (150 mg total) by mouth 2 (two) times daily. 60 capsule 2  . diazepam (VALIUM) 5 MG tablet Take 1-2 tablets (5-10 mg total) by mouth every 6 (six) hours as needed for muscle spasms. 30 tablet 0  . polyethylene glycol powder (GLYCOLAX/MIRALAX) 17 GM/SCOOP powder Take 17 g by mouth 2 (two) times daily as needed for moderate constipation. (Patient not taking: Reported on 09/26/2020) 500 g 3  . tadalafil (CIALIS) 20 MG tablet Take 1 tablet (20 mg total) by mouth daily as needed for erectile dysfunction. (Patient not taking: Reported on 03/29/2020) 10 tablet 0   No facility-administered medications prior to visit.    ROS: Review of Systems  Constitutional: Positive for fatigue. Negative for appetite change and unexpected weight change.  HENT: Positive for congestion, postnasal drip and rhinorrhea. Negative for nosebleeds, sneezing, sore throat and trouble swallowing.   Eyes: Negative for itching and visual disturbance.  Respiratory: Negative for cough.   Cardiovascular: Negative for chest pain, palpitations and leg swelling.  Gastrointestinal: Negative for abdominal distention, blood in  stool, diarrhea and nausea.  Genitourinary: Negative for frequency and hematuria.  Musculoskeletal: Positive for back pain and gait problem. Negative for joint swelling and neck pain.  Skin: Negative for rash.  Neurological: Negative for dizziness, tremors, speech difficulty and weakness.  Psychiatric/Behavioral: Positive for sleep disturbance. Negative for agitation, dysphoric mood and suicidal ideas. The patient is not nervous/anxious.     Objective:  BP 120/82 (BP Location: Left Arm)   Pulse 83   Temp 98.7 F (37.1 C) (Oral)   Ht 5\' 8"  (1.727 m)   Wt 203 lb 9.6 oz (92.4 kg)   SpO2 97%   BMI 30.96 kg/m   BP Readings from Last 3 Encounters:  09/26/20 120/82  07/29/20 112/78  06/28/20 120/84    Wt Readings from Last 3 Encounters:  09/26/20 203 lb 9.6 oz (92.4 kg)  07/29/20 205 lb (93 kg)  06/28/20 207 lb 9.6 oz (94.2 kg)    Physical Exam Constitutional:      General: He is not in acute distress.    Appearance: He is well-developed.     Comments: NAD  Eyes:     Conjunctiva/sclera: Conjunctivae normal.     Pupils: Pupils are equal, round, and reactive to light.  Neck:     Thyroid: No thyromegaly.     Vascular: No JVD.  Cardiovascular:     Rate and Rhythm: Normal rate and regular rhythm.     Heart sounds: Normal heart sounds. No murmur heard. No friction rub. No gallop.   Pulmonary:  Effort: Pulmonary effort is normal. No respiratory distress.     Breath sounds: Normal breath sounds. No wheezing or rales.  Chest:     Chest wall: No tenderness.  Abdominal:     General: Bowel sounds are normal. There is no distension.     Palpations: Abdomen is soft. There is no mass.     Tenderness: There is no abdominal tenderness. There is no guarding or rebound.  Musculoskeletal:        General: Tenderness present. Normal range of motion.     Cervical back: Normal range of motion.  Lymphadenopathy:     Cervical: No cervical adenopathy.  Skin:    General: Skin is warm and  dry.     Findings: No rash.  Neurological:     Mental Status: He is alert and oriented to person, place, and time.     Cranial Nerves: No cranial nerve deficit.     Motor: No abnormal muscle tone.     Coordination: Coordination normal.     Gait: Gait abnormal.     Deep Tendon Reflexes: Reflexes are normal and symmetric.  Psychiatric:        Behavior: Behavior normal.        Thought Content: Thought content normal.        Judgment: Judgment normal.   spastic LS spine Antalgic gait  Lab Results  Component Value Date   WBC 7.4 04/07/2020   HGB 14.7 04/07/2020   HCT 43.0 04/07/2020   PLT 258 04/07/2020   GLUCOSE 115 (H) 06/23/2020   ALT 37 06/23/2020   AST 19 06/23/2020   NA 139 06/23/2020   K 4.2 06/23/2020   CL 105 06/23/2020   CREATININE 0.88 06/23/2020   BUN 11 06/23/2020   CO2 29 06/23/2020   TSH 1.26 01/14/2020    CT LUMBAR SPINE WO CONTRAST  Result Date: 06/09/2020 CLINICAL DATA:  Low back pain. Lumbar radiculopathy. Lumbar fusion 04/11/2020 EXAM: CT LUMBAR SPINE WITHOUT CONTRAST TECHNIQUE: Multidetector CT imaging of the lumbar spine was performed without intravenous contrast administration. Multiplanar CT image reconstructions were also generated. COMPARISON:  Lumbar MRI 02/13/2020 FINDINGS: Segmentation: Normal Alignment: Normal Vertebrae: Negative for fracture or mass. Hemangioma L1 vertebral body. Paraspinal and other soft tissues: Postsurgical edema in the posterior soft tissues in the lumbar spine. No fluid collection. No paraspinous mass or adenopathy. Disc levels: T12-L1: Negative L1-2: Mild disc bulging.  Negative for stenosis L2-3: Diffuse disc bulging and mild facet degeneration. Mild narrowing of the spinal canal. L3-4: Moderate disc bulging. Diffuse endplate spurring. Mild facet degeneration with mild to moderate spinal stenosis. Moderate subarticular stenosis bilaterally L4-5: Decompressive laminectomy. Pedicle screw and interbody fusion. Hardware in good  position. Negative for stenosis L5-S1: Decompressive laminectomy. Bilateral pedicle screw and interbody fusion. Negative for spinal or foraminal stenosis. IMPRESSION: Satisfactory pedicle screw and interbody fusion L4-5 and L5-S1 Mild to moderate spinal stenosis L3-4 with moderate subarticular stenosis bilaterally. Electronically Signed   By: Franchot Gallo M.D.   On: 06/09/2020 16:25    Assessment & Plan:    Walker Kehr, MD

## 2020-09-26 NOTE — Assessment & Plan Note (Signed)
On B complex 

## 2020-09-27 ENCOUNTER — Other Ambulatory Visit: Payer: Self-pay

## 2020-09-27 ENCOUNTER — Emergency Department (HOSPITAL_COMMUNITY): Payer: 59

## 2020-09-27 ENCOUNTER — Emergency Department (HOSPITAL_COMMUNITY)
Admission: EM | Admit: 2020-09-27 | Discharge: 2020-09-28 | Disposition: A | Discharge status: Home / Self Care | Payer: 59 | Source: Home / Self Care | Attending: Emergency Medicine | Admitting: Emergency Medicine

## 2020-09-27 ENCOUNTER — Encounter (HOSPITAL_COMMUNITY): Payer: Self-pay

## 2020-09-27 ENCOUNTER — Telehealth: Payer: Self-pay | Admitting: Internal Medicine

## 2020-09-27 DIAGNOSIS — R079 Chest pain, unspecified: Secondary | ICD-10-CM

## 2020-09-27 DIAGNOSIS — I10 Essential (primary) hypertension: Secondary | ICD-10-CM | POA: Insufficient documentation

## 2020-09-27 DIAGNOSIS — R61 Generalized hyperhidrosis: Secondary | ICD-10-CM | POA: Insufficient documentation

## 2020-09-27 DIAGNOSIS — R0789 Other chest pain: Secondary | ICD-10-CM | POA: Insufficient documentation

## 2020-09-27 LAB — BASIC METABOLIC PANEL
Anion gap: 7 (ref 5–15)
BUN: 16 mg/dL (ref 6–20)
CO2: 27 mmol/L (ref 22–32)
Calcium: 9.1 mg/dL (ref 8.9–10.3)
Chloride: 102 mmol/L (ref 98–111)
Creatinine, Ser: 1.09 mg/dL (ref 0.61–1.24)
GFR, Estimated: >60 mL/min (ref >60)
Glucose, Bld: 131 mg/dL — ABNORMAL HIGH (ref 70–99)
Potassium: 3.9 mmol/L (ref 3.5–5.1)
Sodium: 136 mmol/L (ref 135–145)

## 2020-09-27 LAB — CBC
HCT: 47.9 % (ref 39.0–52.0)
HCT: 45 % (ref 39.0–52.0)
Hemoglobin: 15.8 g/dL (ref 13.0–17.0)
Hemoglobin: 15.3 g/dL (ref 13.0–17.0)
MCH: 30.6 pg (ref 26.0–34.0)
MCH: 29.5 pg (ref 26.0–34.0)
MCHC: 33 g/dL (ref 30.0–36.0)
MCHC: 34 g/dL (ref 30.0–36.0)
MCV: 92.8 fL (ref 80.0–100.0)
MCV: 86.9 fL (ref 80.0–100.0)
Platelets: 236 10*3/uL (ref 150–400)
Platelets: 289 10*3/uL (ref 150–400)
RBC: 5.16 MIL/uL (ref 4.22–5.81)
RBC: 5.18 MIL/uL (ref 4.22–5.81)
RDW: 14.2 % (ref 11.5–15.5)
RDW: 12.4 % (ref 11.5–15.5)
WBC: 13.4 10*3/uL — ABNORMAL HIGH (ref 4.0–10.5)
WBC: 9.1 10*3/uL (ref 4.0–10.5)
nRBC: 0 % (ref 0.0–0.2)
nRBC: 0 % (ref 0.0–0.2)

## 2020-09-27 LAB — TROPONIN I (HIGH SENSITIVITY): Troponin I (High Sensitivity): 2 ng/L (ref <18)

## 2020-09-27 NOTE — Telephone Encounter (Signed)
He can try to take it in am for a few days to see if side effects resolve Thx

## 2020-09-27 NOTE — ED Triage Notes (Signed)
Pt states he started having chest pain and aching in left arm. Pt vomited and had shortness of breath. States that the nausea and shortness of breath have subsided.

## 2020-09-27 NOTE — Telephone Encounter (Signed)
Patient calling to report DULoxetine (CYMBALTA) 30 MG capsule makes him feel "weird" and was unable to sleep. He states medication  Also gave him a headache   Please advise

## 2020-09-28 LAB — TROPONIN I (HIGH SENSITIVITY): Troponin I (High Sensitivity): 3 ng/L (ref <18)

## 2020-09-28 MED ORDER — LIDOCAINE VISCOUS HCL 2 % MT SOLN
15.0000 mL | Freq: Once | OROMUCOSAL | Status: AC
  Administered 2020-09-28: 15 mL via ORAL
  Filled 2020-09-28: qty 15

## 2020-09-28 MED ORDER — ALUM & MAG HYDROXIDE-SIMETH 200-200-20 MG/5ML PO SUSP
30.0000 mL | Freq: Once | ORAL | Status: AC
  Administered 2020-09-28: 30 mL via ORAL
  Filled 2020-09-28: qty 30

## 2020-09-28 MED ORDER — HYDROCODONE-ACETAMINOPHEN 5-325 MG PO TABS
1.0000 | ORAL_TABLET | Freq: Once | ORAL | Status: AC
  Administered 2020-09-28: 1 via ORAL
  Filled 2020-09-28: qty 1

## 2020-09-28 NOTE — Telephone Encounter (Signed)
Called pt there was no answer LMOM w/MD response../lmb 

## 2020-09-28 NOTE — ED Provider Notes (Signed)
[facility] EMERGENCY DEPARTMENT Provider Note   CSN: [id number] Arrival date & time: 09/27/20  2140     History Chief Complaint  Patient presents with  . Chest Pain    Adam Vaughn is a 45 y.o. male.  Patient with history of GERD, low back pain, presents for evaluation of sudden onset, non-radiating, left sided chest pain around 9:00 pm tonight. He reports deep breathing made it worse but denies any sense of SOB, no alleviating factors. He became diaphoretic. He tried taking alka seltzer, and non-aspirin Goody's powder without relief. He reports 2 other similar episodes of pain in the last 30 days. No cough, congestion, fever. He forced a vomiting episode to see if this would help but denies nausea.   The history is provided by the patient. No language interpreter was used.  Chest Pain Associated symptoms: diaphoresis   Associated symptoms: no fever, no nausea and no weakness        Past Medical History:  Diagnosis Date  . Arthritis   . GERD (gastroesophageal reflux disease)   . History of kidney stones   . Hypertension   . Kidney stones     Patient Active Problem List   Diagnosis Date Noted  . Insomnia 09/26/2020  . Knee pain 06/23/2020  . Low vitamin B12 level 05/31/2020  . Ankle sprain 05/31/2020  . Constipation by delayed colonic transit 05/31/2020  . Degenerative spondylolisthesis 04/11/2020  . 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 DDD (degenerative disc disease), lumbar 01/20/2020  . Left lumbar radiculitis 01/20/2020  . Vitamin D deficiency 01/15/2020  . Low back pain 01/12/2020  . IBS (irritable bowel syndrome) 01/12/2020  . Nephrolithiasis 01/12/2020  . Erectile dysfunction 01/12/2020  . GERD (gastroesophageal reflux disease) 01/12/2020    Past Surgical History:  Procedure Laterality Date  . COLONOSCOPY    . TONSILLECTOMY    . VASECTOMY         Family History  Problem Relation Age of  Onset  . Hypertension Father   . Heart attack Father   . Diabetes Sister     Social History   Tobacco Use  . Smoking status: Never Smoker  . Smokeless tobacco: Never Used  Vaping Use  . Vaping Use: Never used  Substance Use Topics  . Alcohol use: Never  . Drug use: Never    Home Medications Prior to Admission medications   Medication Sig Start Date End Date Taking? Authorizing Provider  Cholecalciferol (VITAMIN D3) 50 MCG (2000 UT) capsule Take 1 capsule (2,000 Units total) by mouth daily. 05/31/20   Plotnikov, Evie Lacks, MD  Cyanocobalamin (VITAMIN B-12) 1000 MCG SUBL Place 1 tablet (1,000 mcg total) under the tongue daily. 05/31/20   Plotnikov, Evie Lacks, MD  DULoxetine (CYMBALTA) 30 MG capsule Take 1 capsule (30 mg total) by mouth at bedtime. 09/26/20   Plotnikov, Evie Lacks, MD  esomeprazole (NEXIUM) 40 MG capsule Take 1 capsule (40 mg total) by mouth daily. 02/25/20 02/24/21  Plotnikov, Evie Lacks, MD  fluticasone (FLONASE) 50 MCG/ACT nasal spray Place 2 sprays into both nostrils daily. 09/26/20   Plotnikov, Evie Lacks, MD  naproxen (NAPROSYN) 500 MG tablet Take 1 tablet (500 mg total) by mouth 2 (two) times daily as needed for moderate pain. 09/26/20   Plotnikov, Evie Lacks, MD  oxyCODONE 10 MG TABS Take 1 tablet (10 mg total) by mouth every 3 (three) hours as needed for severe pain ((score 7 to 10)). 04/13/20  [name], MD  pregabalin (LYRICA) 150 MG capsule Take 1 capsule (150 mg total) by mouth 2 (two) times daily. 06/28/20   Gregor Hams, MD    Allergies    Gabapentin  Review of Systems   Review of Systems  Constitutional: Positive for diaphoresis. Negative for chills and fever.  HENT: Negative.   Respiratory:       See HPI.  Cardiovascular: Positive for chest pain.  Gastrointestinal: Negative.  Negative for nausea.  Musculoskeletal: Negative.   Skin: Negative.   Neurological: Negative.  Negative for weakness.    Physical Exam Updated Vital Signs BP (!) 145/78    Pulse 88   Temp 97.6 F (36.4 C) (Oral)   Resp 17   SpO2 98%   Physical Exam Vitals and nursing note reviewed.  Constitutional:      Appearance: He is well-developed.  HENT:     Head: Normocephalic.  Neck:     Vascular: No carotid bruit.  Cardiovascular:     Rate and Rhythm: Normal rate and regular rhythm.     Heart sounds: No murmur heard.   Pulmonary:     Effort: Pulmonary effort is normal.     Breath sounds: Normal breath sounds. No wheezing, rhonchi or rales.  Chest:     Chest wall: Tenderness (Mild reproducible chest tenderness. ) present.  Abdominal:     General: Bowel sounds are normal.     Palpations: Abdomen is soft.     Tenderness: There is no abdominal tenderness. There is no guarding or rebound.  Musculoskeletal:        General: Normal range of motion.     Cervical back: Normal range of motion and neck supple.  Skin:    General: Skin is warm and dry.     Findings: No rash.  Neurological:     Mental Status: He is alert.     Cranial Nerves: No cranial nerve deficit.     ED Results / Procedures / Treatments   Labs (all labs ordered are listed, but only abnormal results are displayed) Labs Reviewed  CBC - Abnormal; Notable for the following components:      Result Value   WBC 13.4 (*)    All other components within normal limits  BASIC METABOLIC PANEL - Abnormal; Notable for the following components:   Glucose, Bld 131 (*)    All other components within normal limits  CBC  TROPONIN I (HIGH SENSITIVITY)  TROPONIN I (HIGH SENSITIVITY)   Results for orders placed or performed during the hospital encounter of 09/27/20  CBC  Result Value Ref Range   WBC 13.4 (H) 4.0 - 10.5 K/uL   RBC 5.16 4.22 - 5.81 MIL/uL   Hemoglobin 15.8 13.0 - 17.0 g/dL   HCT 47.9 39.0 - 52.0 %   MCV 92.8 80.0 - 100.0 fL   MCH 30.6 26.0 - 34.0 pg   MCHC 33.0 30.0 - 36.0 g/dL   RDW 14.2 11.5 - 15.5 %   Platelets 236 150 - 400 K/uL   nRBC 0.0 0.0 - 0.2 %  Basic metabolic  panel  Result Value Ref Range   Sodium 136 135 - 145 mmol/L   Potassium 3.9 3.5 - 5.1 mmol/L   Chloride 102 98 - 111 mmol/L   CO2 27 22 - 32 mmol/L   Glucose, Bld 131 (H) 70 - 99 mg/dL   BUN 16 6 - 20 mg/dL   Creatinine, Ser 1.09 0.61 - 1.24 mg/dL   Calcium  9.1 8.9 - 10.3 mg/dL   GFR, Estimated >60 >60 mL/min   Anion gap 7 5 - 15  CBC  Result Value Ref Range   WBC 9.1 4.0 - 10.5 K/uL   RBC 5.18 4.22 - 5.81 MIL/uL   Hemoglobin 15.3 13.0 - 17.0 g/dL   HCT 45.0 39.0 - 52.0 %   MCV 86.9 80.0 - 100.0 fL   MCH 29.5 26.0 - 34.0 pg   MCHC 34.0 30.0 - 36.0 g/dL   RDW 12.4 11.5 - 15.5 %   Platelets 289 150 - 400 K/uL   nRBC 0.0 0.0 - 0.2 %  Troponin I (High Sensitivity)  Result Value Ref Range   Troponin I (High Sensitivity) 2 <18 ng/L  Troponin I (High Sensitivity)  Result Value Ref Range   Troponin I (High Sensitivity) 3 <18 ng/L    EKG EKG Interpretation  Date/Time:  Tuesday September 27 2020 21:44:05 EDT Ventricular Rate:  77 PR Interval:  152 QRS Duration: 88 QT Interval:  382 QTC Calculation: 432 R Axis:   34 Text Interpretation: Normal sinus rhythm Normal ECG When compared with ECG of 04/07/2020, No significant change was found Confirmed by Delora Fuel ([id number]) on 09/27/2020 11:58:47 PM   Radiology DG Chest 2 View  Result Date: 09/27/2020 CLINICAL DATA:  Chest pain.  Left arm aching. EXAM: CHEST - 2 VIEW COMPARISON:  None. FINDINGS: The cardiomediastinal contours are normal. The lungs are clear. Pulmonary vasculature is normal. No consolidation, pleural effusion, or pneumothorax. No acute osseous abnormalities are seen. IMPRESSION: Negative radiographs of the chest. Electronically Signed   By: Keith Rake M.D.   On: 09/27/2020 22:14    Procedures Procedures   Medications Ordered in ED Medications - No data to display  ED Course  I have reviewed the triage vital signs and the nursing notes.  Pertinent labs & imaging results that were available during my care of  the patient were reviewed by me and considered in my medical decision making (see chart for details).    MDM Rules/Calculators/A&P                          Patient to ED with sharp chest pain, constant but improving since 9:00 pm 3/29.   Pain is atypical for ACS in that it is constant, no SOB, nausea, reproducible. Troponin x 2 negative. EKG nonacute. CXR clear. Patient with a heart score of 2 for age and family history. Will refer to cards as this is now repeated episodes.  Pain felt likely to be GI, with history of reflux, constant pain, however, no better with GI cocktail. He is on Nexium QD, will increase to BID. Recommend discussing GI referral with PCP (Plotnikov) after cardiology evaluation.    Final Clinical Impression(s) / ED Diagnoses Final diagnoses:  None   1. Nonspecific chest pain  Rx / DC Orders ED Discharge Orders    None       Dennie Bible 09/28/20 0155    Palumbo, April, MD 09/28/20 [phone]

## 2020-09-28 NOTE — Discharge Instructions (Signed)
Return to the ED with any severe pain, shortness of breath or new concerning symptoms.

## 2020-09-29 ENCOUNTER — Telehealth: Payer: Self-pay | Admitting: Internal Medicine

## 2020-09-29 NOTE — Telephone Encounter (Signed)
LVM instructing pt to call office as f/u for ED visit.

## 2020-09-29 NOTE — Telephone Encounter (Signed)
Caller states spouse is urinating blood. He is having chest pain. Went to the ER 2 days ago. She is not with the pt but at work and he does not know she is calling.  Caller is not with the pt. advised caller that if pt is having chest pain he may need to call 911 or go to the ER. States she will leave work and take him to the ER

## 2020-09-30 ENCOUNTER — Other Ambulatory Visit (INDEPENDENT_AMBULATORY_CARE_PROVIDER_SITE_OTHER): Payer: 59

## 2020-09-30 ENCOUNTER — Encounter (HOSPITAL_COMMUNITY): Payer: Self-pay

## 2020-09-30 ENCOUNTER — Inpatient Hospital Stay (HOSPITAL_COMMUNITY)
Admission: EM | Admit: 2020-09-30 | Discharge: 2020-10-04 | DRG: 419 | Disposition: A | Discharge status: Home / Self Care | Payer: 59 | Attending: Surgery | Admitting: Surgery

## 2020-09-30 ENCOUNTER — Emergency Department (HOSPITAL_COMMUNITY): Payer: 59

## 2020-09-30 ENCOUNTER — Ambulatory Visit (INDEPENDENT_AMBULATORY_CARE_PROVIDER_SITE_OTHER): Payer: 59 | Admitting: Family

## 2020-09-30 ENCOUNTER — Other Ambulatory Visit: Payer: Self-pay

## 2020-09-30 ENCOUNTER — Encounter: Payer: Self-pay | Admitting: Family

## 2020-09-30 VITALS — BP 144/90 | HR 97 | Temp 98.3°F | Ht 68.0 in | Wt 204.0 lb

## 2020-09-30 DIAGNOSIS — Z79899 Other long term (current) drug therapy: Secondary | ICD-10-CM

## 2020-09-30 DIAGNOSIS — Z419 Encounter for procedure for purposes other than remedying health state, unspecified: Secondary | ICD-10-CM

## 2020-09-30 DIAGNOSIS — R109 Unspecified abdominal pain: Secondary | ICD-10-CM | POA: Diagnosis not present

## 2020-09-30 DIAGNOSIS — K8010 Calculus of gallbladder with chronic cholecystitis without obstruction: Secondary | ICD-10-CM | POA: Diagnosis present

## 2020-09-30 DIAGNOSIS — R1011 Right upper quadrant pain: Secondary | ICD-10-CM

## 2020-09-30 DIAGNOSIS — Z886 Allergy status to analgesic agent status: Secondary | ICD-10-CM | POA: Diagnosis not present

## 2020-09-30 DIAGNOSIS — G4700 Insomnia, unspecified: Secondary | ICD-10-CM | POA: Diagnosis present

## 2020-09-30 DIAGNOSIS — E559 Vitamin D deficiency, unspecified: Secondary | ICD-10-CM | POA: Diagnosis present

## 2020-09-30 DIAGNOSIS — Z8249 Family history of ischemic heart disease and other diseases of the circulatory system: Secondary | ICD-10-CM | POA: Diagnosis not present

## 2020-09-30 DIAGNOSIS — R319 Hematuria, unspecified: Secondary | ICD-10-CM

## 2020-09-30 DIAGNOSIS — R17 Unspecified jaundice: Secondary | ICD-10-CM | POA: Diagnosis not present

## 2020-09-30 DIAGNOSIS — I10 Essential (primary) hypertension: Secondary | ICD-10-CM | POA: Diagnosis present

## 2020-09-30 DIAGNOSIS — K810 Acute cholecystitis: Secondary | ICD-10-CM | POA: Diagnosis present

## 2020-09-30 DIAGNOSIS — K219 Gastro-esophageal reflux disease without esophagitis: Secondary | ICD-10-CM | POA: Diagnosis present

## 2020-09-30 DIAGNOSIS — R933 Abnormal findings on diagnostic imaging of other parts of digestive tract: Secondary | ICD-10-CM

## 2020-09-30 DIAGNOSIS — Z20822 Contact with and (suspected) exposure to covid-19: Secondary | ICD-10-CM | POA: Diagnosis present

## 2020-09-30 LAB — POCT URINALYSIS DIP (CLINITEK)
Glucose, UA: NEGATIVE mg/dL
Ketones, POC UA: NEGATIVE mg/dL
Nitrite, UA: NEGATIVE
Spec Grav, UA: >=1.03 — AB (ref 1.010–1.025)
Urobilinogen, UA: 0.2 E.U./dL
pH, UA: 6 (ref 5.0–8.0)

## 2020-09-30 LAB — TROPONIN I (HIGH SENSITIVITY)
Troponin I (High Sensitivity): 3 ng/L (ref <18)
Troponin I (High Sensitivity): <2 ng/L (ref <18)

## 2020-09-30 LAB — COMPREHENSIVE METABOLIC PANEL
ALT: 518 U/L — ABNORMAL HIGH (ref 0–53)
AST: 101 U/L — ABNORMAL HIGH (ref 0–37)
Albumin: 4.3 g/dL (ref 3.5–5.2)
Alkaline Phosphatase: 166 U/L — ABNORMAL HIGH (ref 39–117)
BUN: 13 mg/dL (ref 6–23)
CO2: 27 mEq/L (ref 19–32)
Calcium: 9.2 mg/dL (ref 8.4–10.5)
Chloride: 104 mEq/L (ref 96–112)
Creatinine, Ser: 0.92 mg/dL (ref 0.40–1.50)
GFR: 100.76 mL/min (ref >60.00)
Glucose, Bld: 107 mg/dL — ABNORMAL HIGH (ref 70–99)
Potassium: 3.5 mEq/L (ref 3.5–5.1)
Sodium: 140 mEq/L (ref 135–145)
Total Bilirubin: 1.2 mg/dL (ref 0.2–1.2)
Total Protein: 7.1 g/dL (ref 6.0–8.3)

## 2020-09-30 LAB — CBC WITH DIFFERENTIAL/PLATELET
Basophils Absolute: 0 10*3/uL (ref 0.0–0.1)
Basophils Relative: 0.7 % (ref 0.0–3.0)
Eosinophils Absolute: 0.1 10*3/uL (ref 0.0–0.7)
Eosinophils Relative: 1.1 % (ref 0.0–5.0)
HCT: 43.8 % (ref 39.0–52.0)
Hemoglobin: 15 g/dL (ref 13.0–17.0)
Lymphocytes Relative: 30.1 % (ref 12.0–46.0)
Lymphs Abs: 1.7 10*3/uL (ref 0.7–4.0)
MCHC: 34.3 g/dL (ref 30.0–36.0)
MCV: 86.3 fl (ref 78.0–100.0)
Monocytes Absolute: 0.5 10*3/uL (ref 0.1–1.0)
Monocytes Relative: 9.6 % (ref 3.0–12.0)
Neutro Abs: 3.3 10*3/uL (ref 1.4–7.7)
Neutrophils Relative %: 58.5 % (ref 43.0–77.0)
Platelets: 256 10*3/uL (ref 150.0–400.0)
RBC: 5.08 Mil/uL (ref 4.22–5.81)
RDW: 13.2 % (ref 11.5–15.5)
WBC: 5.7 10*3/uL (ref 4.0–10.5)

## 2020-09-30 LAB — AMYLASE: Amylase: 43 U/L (ref 27–131)

## 2020-09-30 LAB — RESP PANEL BY RT-PCR (FLU A&B, COVID) ARPGX2
Influenza A by PCR: NEGATIVE
Influenza B by PCR: NEGATIVE
SARS Coronavirus 2 by RT PCR: NEGATIVE

## 2020-09-30 LAB — URINALYSIS, ROUTINE W REFLEX MICROSCOPIC
Bacteria, UA: NONE SEEN
Bilirubin Urine: NEGATIVE
Glucose, UA: NEGATIVE mg/dL
Ketones, ur: NEGATIVE mg/dL
Leukocytes,Ua: NEGATIVE
Nitrite: NEGATIVE
Protein, ur: NEGATIVE mg/dL
Specific Gravity, Urine: 1.021 (ref 1.005–1.030)
pH: 5 (ref 5.0–8.0)

## 2020-09-30 LAB — LIPASE: Lipase: 39 U/L (ref 11.0–59.0)

## 2020-09-30 MED ORDER — SODIUM CHLORIDE 0.9 % IV SOLN
2.0000 g | INTRAVENOUS | Status: DC
  Administered 2020-10-01 – 2020-10-03 (×4): 2 g via INTRAVENOUS
  Filled 2020-09-30: qty 2
  Filled 2020-09-30: qty 20
  Filled 2020-09-30 (×2): qty 2

## 2020-09-30 MED ORDER — LACTATED RINGERS IV SOLN: INTRAVENOUS | Status: DC

## 2020-09-30 MED ORDER — HYDROMORPHONE HCL 1 MG/ML IJ SOLN
0.5000 mg | Freq: Once | INTRAMUSCULAR | Status: AC
Start: 2020-09-30 — End: 2020-09-30
  Administered 2020-09-30: 0.5 mg via INTRAVENOUS
  Filled 2020-09-30: qty 1

## 2020-09-30 MED ORDER — ENOXAPARIN SODIUM 40 MG/0.4ML ~~LOC~~ SOLN
40.0000 mg | Freq: Every day | SUBCUTANEOUS | Status: DC
  Administered 2020-10-01: 40 mg via SUBCUTANEOUS
  Filled 2020-09-30: qty 0.4

## 2020-09-30 MED ORDER — ONDANSETRON 4 MG PO TBDP: 4.0000 mg | ORAL_TABLET | Freq: Four times a day (QID) | ORAL | Status: DC | PRN

## 2020-09-30 MED ORDER — ONDANSETRON HCL 4 MG/2ML IJ SOLN
4.0000 mg | Freq: Four times a day (QID) | INTRAMUSCULAR | Status: DC | PRN
  Administered 2020-10-01 – 2020-10-03 (×7): 4 mg via INTRAVENOUS
  Filled 2020-09-30 (×7): qty 2

## 2020-09-30 MED ORDER — DEXTROSE-NACL 5-0.9 % IV SOLN: INTRAVENOUS | Status: DC

## 2020-09-30 MED ORDER — METHOCARBAMOL 500 MG PO TABS: 500.0000 mg | ORAL_TABLET | Freq: Four times a day (QID) | ORAL | Status: DC | PRN

## 2020-09-30 MED ORDER — HYDROMORPHONE HCL 1 MG/ML IJ SOLN
1.0000 mg | INTRAMUSCULAR | Status: DC | PRN
  Administered 2020-09-30 – 2020-10-02 (×6): 1 mg via INTRAVENOUS
  Filled 2020-09-30 (×6): qty 1

## 2020-09-30 NOTE — H&P (Signed)
Adam Vaughn is an 45 y.o. male.   Chief Complaint: Right upper quadrant abdominal pain HPI: Pleasant 45 year old male presents with 2-day history of right upper quadrant abdominal pain and chest pain.  He was seen 2 days ago at the [facility] ED and worked up for cardiac issue which was negative.  He was sent home.  He had recurrent epigastric and right upper quadrant pain especially after a peanut butter sandwich last night.  His primary care sent him for an ultrasound showed gallstones he was sent to the emergency room.  His pain is improved but most of his pain is in his right upper quadrant and epigastrium.  It is still present and has not resolved with pain medication.  He currently has no nausea or vomiting.  Past Medical History:  Diagnosis Date  . Arthritis   . GERD (gastroesophageal reflux disease)   . History of kidney stones   . Hypertension   . Kidney stones     Past Surgical History:  Procedure Laterality Date  . COLONOSCOPY    . TONSILLECTOMY    . VASECTOMY      Family History  Problem Relation Age of Onset  . Hypertension Father   . Heart attack Father   . Diabetes Sister    Social History:  reports that he has never smoked. He has never used smokeless tobacco. He reports that he does not drink alcohol and does not use drugs.  Allergies:  Allergies  Allergen Reactions  . Gabapentin     Twitching muscles    (Not in a hospital admission)   Results for orders placed or performed during the hospital encounter of 09/30/20 (from the past 48 hour(s))  Urinalysis, Routine w reflex microscopic Urine, Clean Catch     Status: Abnormal   Collection Time: 09/30/20  4:45 PM  Result Value Ref Range   Color, Urine YELLOW YELLOW   APPearance CLEAR CLEAR   Specific Gravity, Urine 1.021 1.005 - 1.030   pH 5.0 5.0 - 8.0   Glucose, UA NEGATIVE NEGATIVE mg/dL   Hgb urine dipstick SMALL (A) NEGATIVE   Bilirubin Urine NEGATIVE NEGATIVE   Ketones, ur NEGATIVE NEGATIVE mg/dL    Protein, ur NEGATIVE NEGATIVE mg/dL   Nitrite NEGATIVE NEGATIVE   Leukocytes,Ua NEGATIVE NEGATIVE   RBC / HPF 6-10 0 - 5 RBC/hpf   WBC, UA 0-5 0 - 5 WBC/hpf   Bacteria, UA NONE SEEN NONE SEEN   Mucus PRESENT    Ca Oxalate Crys, UA PRESENT     Comment: Performed at [facility], [place] [address] [place], [place] [address]  Troponin I (High Sensitivity)     Status: None   Collection Time: 09/30/20  5:01 PM  Result Value Ref Range   Troponin I (High Sensitivity) 3 <18 ng/L    Comment: (NOTE) Elevated high sensitivity troponin I (hsTnI) values and significant  changes across serial measurements may suggest ACS but many other  chronic and acute conditions are known to elevate hsTnI results.  Refer to the "Links" section for chest pain algorithms and additional  guidance. Performed at [facility] [place] [address], [place], [place] [address]   Troponin I (High Sensitivity)     Status: None   Collection Time: 09/30/20  7:15 PM  Result Value Ref Range   Troponin I (High Sensitivity) <2 <18 ng/L    Comment: (NOTE) Elevated high sensitivity troponin I (hsTnI) values and significant  changes across serial measurements may suggest  ACS but many other  chronic and acute conditions are known to elevate hsTnI results.  Refer to the "Links" section for chest pain algorithms and additional  guidance. Performed at [facility], [place] [address] [place], [place] [address]    DG Chest 2 View  Result Date: 09/30/2020 CLINICAL DATA:  Chest pain with right upper quadrant abdominal pain, nausea and vomiting for 3 days. EXAM: CHEST - 2 VIEW COMPARISON:  Radiographs 09/27/2020. FINDINGS: The heart size and mediastinal contours are normal. The lungs are clear. There is no pleural effusion or pneumothorax. No acute osseous findings are identified. IMPRESSION: Stable chest.  No active cardiopulmonary process. Electronically Signed   By: Richardean Sale  M.D.   On: 09/30/2020 18:03   US Abdomen Limited RUQ (LIVER/GB)  Result Date: 09/30/2020 CLINICAL DATA:  Right upper quadrant pain for 1 month. EXAM: ULTRASOUND ABDOMEN LIMITED RIGHT UPPER QUADRANT COMPARISON:  CT stone study 07/10/2017. CT abdomen and pelvis without and with contrast 10/25/2009 FINDINGS: Gallbladder: Gallbladder is diffusely filled with sludge. Shadowing stones measuring up to 6 mm are present. Wall thickness is within normal limits at 2 mm. No sonographic Percell Miller sign is reported. Common bile duct: Diameter: 3 mm, within normal limits. Liver: Focal hyperechoic lesion in the left lobe the liver measures up to 10 mm. No other focal lesions are present. Parenchyma is mildly echogenic. Portal vein is patent on color Doppler imaging with normal direction of blood flow towards the liver. Other: None. IMPRESSION: 1. Cholelithiasis without evidence for cholecystitis. 2. Diffuse gallbladder sludge. 3. 10 mm hyperechoic lesion in the left lobe of the liver likely represents a small hemangioma. Two increased diffuse parenchymal echogenicity of the liver suggesting hepatic steatosis. Electronically Signed   By: San Morelle M.D.   On: 09/30/2020 19:07    Review of Systems  All other systems reviewed and are negative.   Blood pressure (!) 142/86, pulse 78, temperature 98.5 F (36.9 C), temperature source Oral, resp. rate 18, SpO2 98 %. Physical Exam HENT:     Head: Normocephalic.  Eyes:     Extraocular Movements: Extraocular movements intact.     Pupils: Pupils are equal, round, and reactive to light.  Pulmonary:     Effort: Pulmonary effort is normal.     Breath sounds: Normal breath sounds.  Abdominal:     Palpations: Abdomen is soft.     Tenderness: There is abdominal tenderness in the right upper quadrant. Positive signs include Murphy's sign.     Hernia: No hernia is present.  Skin:    General: Skin is warm and dry.  Neurological:     General: No focal deficit present.      Mental Status: He is alert.      Assessment/Plan Acute cholecystitis  Admit for IV fluids, laparoscopic cholecystectomy with cholangiogram in the next 24 to 48 hours schedule depending  Antibiotics  He can have clear liquids until midnight and n.p.o. after midnight.  The procedure has been discussed with the patient. Operative and non operative treatments have been discussed. Risks of surgery include bleeding, infection,  Common bile duct injury,  Injury to the stomach,liver, colon,small intestine, abdominal wall,  Diaphragm,  Major blood vessels,  And the need for an open procedure.  Other risks include worsening of medical problems, death,  DVT and pulmonary embolism, and cardiovascular events.   Medical options have also been discussed. The patient has been informed of long term expectations of surgery and non surgical options,  The patient agrees to proceed.    Turner Daniels, MD 09/30/2020, 9:00 PM

## 2020-09-30 NOTE — ED Triage Notes (Signed)
Pt reports N/V and RUQ abdominal pain x3 days. Pt also reports sharp intermittent chest pain. Pt went to PCP today and had blood work done. PCP concerned about gall bladder and liver issues.

## 2020-09-30 NOTE — Patient Instructions (Signed)
Please go to the ER- let them know you are being sent with jaundice, ALT of [phone]; you will most likely be admitted for at least one night.

## 2020-09-30 NOTE — Telephone Encounter (Signed)
Per chart pt made appt 09/30/20.Marland KitchenJohny Vaughn

## 2020-09-30 NOTE — Progress Notes (Signed)
Adam Vaughn is a 45 y.o. male with the following history as recorded in [employer]:  Patient Active Problem List   Diagnosis Date Noted  . Insomnia 09/26/2020  . Knee pain 06/23/2020  . Low vitamin B12 level 05/31/2020  . Ankle sprain 05/31/2020  . Constipation by delayed colonic transit 05/31/2020  . Degenerative spondylolisthesis 04/11/2020  . 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 DDD (degenerative disc disease), lumbar 01/20/2020  . Left lumbar radiculitis 01/20/2020  . Vitamin D deficiency 01/15/2020  . Low back pain 01/12/2020  . IBS (irritable bowel syndrome) 01/12/2020  . Nephrolithiasis 01/12/2020  . Erectile dysfunction 01/12/2020  . GERD (gastroesophageal reflux disease) 01/12/2020    Current Outpatient Medications  Medication Sig Dispense Refill  . baclofen (LIORESAL) 10 MG tablet Take 10 mg by mouth 3 (three) times daily.    . Cholecalciferol (VITAMIN D3) 50 MCG (2000 UT) capsule Take 1 capsule (2,000 Units total) by mouth daily. 100 capsule 3  . Cyanocobalamin (VITAMIN B-12) 1000 MCG SUBL Place 1 tablet (1,000 mcg total) under the tongue daily. 100 tablet 3  . esomeprazole (NEXIUM) 40 MG capsule Take 1 capsule (40 mg total) by mouth daily. 90 capsule 3  . fluticasone (FLONASE) 50 MCG/ACT nasal spray Place 2 sprays into both nostrils daily. 16 g 6  . naproxen (NAPROSYN) 500 MG tablet Take 1 tablet (500 mg total) by mouth 2 (two) times daily as needed for moderate pain. 60 tablet 3  . oxyCODONE 10 MG TABS Take 1 tablet (10 mg total) by mouth every 3 (three) hours as needed for severe pain ((score 7 to 10)). 40 tablet 0  . pregabalin (LYRICA) 150 MG capsule Take 1 capsule (150 mg total) by mouth 2 (two) times daily. 60 capsule 2   No current facility-administered medications for this visit.    Allergies: Gabapentin  Past Medical History:  Diagnosis Date  . Arthritis   . GERD (gastroesophageal reflux disease)   . History of  kidney stones   . Hypertension   . Kidney stones     Past Surgical History:  Procedure Laterality Date  . COLONOSCOPY    . TONSILLECTOMY    . VASECTOMY      Family History  Problem Relation Age of Onset  . Hypertension Father   . Heart attack Father   . Diabetes Sister     Social History   Tobacco Use  . Smoking status: Never Smoker  . Smokeless tobacco: Never Used  Substance Use Topics  . Alcohol use: Never    Subjective:   Seen at ER earlier this week with severe chest pain; cardiac source was ruled out and sent home with plan to refer to cardiology and see PCP to discuss GI follow up; Notes that pain has continued to worsen and was so severe after eating last night that he considered going back to the ER; pain is now localized in RUQ of abdomen and feels like it radiates up into chest; does have GERD and taking Nexium but notes this pain feels different;  Has also noticed blood in his urine in the past 2 days- history of kidney stones; typically able to pass kidney stones on his own;   Objective:  Vitals:   09/30/20 1419  BP: (!) 144/90  Pulse: 97  Temp: 98.3 F (36.8 C)  TempSrc: Oral  SpO2: 98%  Weight: 204 lb (92.5 kg)  Height: _0  (1.727 m)    General: Well developed, well  nourished, in mild distress; pale coloring;  Skin : Warm and dry.  Head: Normocephalic and atraumatic  Eyes: Sclera appear jaundiced ; pupils round and reactive to light; extraocular movements intact  Ears: External normal; canals clear; tympanic membranes normal  Oropharynx: Pink, supple. No suspicious lesions  Neck: Supple without thyromegaly, adenopathy  Lungs: Respirations unlabored; clear to auscultation bilaterally without wheeze, rales, rhonchi  Abdomen: Limited exam due to pain;   Musculoskeletal: No deformities; no active joint inflammation  Extremities: No edema, cyanosis, clubbing  Vessels: Symmetric bilaterally  Neurologic: Alert and oriented; speech intact; face  symmetrical; moves all extremities well; CNII-XII intact without  focal deficit  Assessment:  1. Jaundice   2. Abdominal pain, unspecified abdominal location   3. Hematuria, unspecified type     Plan:  1. & 2. STAT labs indicate marked elevations of liver functions; ? Gallbladder source; refer to ER for further evaluation- needs imaging today; patient agrees to go to [facility];  3. Will check urine culture; ? Kidney stone; he understands that abdominal imaging will be done at ER and this can show whether kidney stone. Follow-up to be determined;   This visit occurred during the SARS-CoV-2 public health emergency.  Safety protocols were in place, including screening questions prior to the visit, additional usage of staff PPE, and extensive cleaning of exam room while observing appropriate contact time as indicated for disinfecting solutions.     No follow-ups on file.  Orders Placed This Encounter  Procedures  . Urine Culture    Standing Status:   Future    Number of Occurrences:   1    Standing Expiration Date:   09/30/2021  . CBC with Differential/Platelet    Standing Status:   Future    Number of Occurrences:   1    Standing Expiration Date:   09/30/2021  . Comp Met (CMET)    Standing Status:   Future    Number of Occurrences:   1    Standing Expiration Date:   09/30/2021  . Amylase    Standing Status:   Future    Number of Occurrences:   1    Standing Expiration Date:   09/30/2021  . Lipase    Standing Status:   Future    Number of Occurrences:   1    Standing Expiration Date:   09/30/2021  . POCT URINALYSIS DIP (CLINITEK)    Requested Prescriptions    No prescriptions requested or ordered in this encounter

## 2020-09-30 NOTE — Progress Notes (Signed)
Reviewed with patient; he was sent directly to ER for further evaluation.

## 2020-09-30 NOTE — ED Provider Notes (Signed)
Patient evaluated for medical screening exam in the triage room.  Chief Complaint: RUQ pain  HPI:   Right upper quadrant abdominal pain x3 days with associated nausea and vomiting.  Elevated transaminases on blood work at PCP this morning, directed to the ER.  Recently evaluated in the emergency department for chest pain with benign cardiac work-up.  ROS: Right upper quadrant epigastric abdominal pain, chest pain, nausea, vomiting.  NEGATIVE for shortness of breath or palpitations.  Physical Exam:   Gen: No distress  Neuro: Awake and Alert  Cardiac: Normal Rate  Pulm: Normal breath sounds  Abdomen: RUQ, epigastric TTP  Skin: Warm    Focused Exam: Right upper quadrant and epigastric tenderness palpation without rebound or guarding, normal cardiopulmonary exam.  No lower extremity edema.  Mild scleral icterus versus pterygiums  Initiation of care has begun. The patient has been counseled on the process, plan, and necessity for staying for the completion/evaluation, and the remainder of the medical screening examination.  CBC, CMP, amylase, and lipase were obtained at PCP this morning and are visible in epic. RUQ [place], troponin, EKG ordered to further workup.  MSE was initiated and I personally evaluated the patient and placed orders (if any) at  4:58 PM on September 30, 2020.  The patient appears stable so that the remainder of the MSE may be completed by another provider.   Aura Dials 09/30/20 1703    Lacretia Leigh, MD 10/03/20 0900

## 2020-09-30 NOTE — ED Provider Notes (Signed)
[facility] DEPT Provider Note   CSN: [id number] Arrival date & time: 09/30/20  1613     History Chief Complaint  Patient presents with  . Abdominal Pain  . Chest Pain    Adam Vaughn is a 45 y.o. male.  45 year old male presents with upper quadrant pain times several days.  Seen recently for same and was referred to GI.  States he continues to have pain is worse with eating.  He has had occasional nausea vomiting.  No recent fever or chills.  No cardiac symptoms.  Symptoms are worse with food.  Saw his PCP and had blood work done and sent here for further evaluation.        Past Medical History:  Diagnosis Date  . Arthritis   . GERD (gastroesophageal reflux disease)   . History of kidney stones   . Hypertension   . Kidney stones     Patient Active Problem List   Diagnosis Date Noted  . Insomnia 09/26/2020  . Knee pain 06/23/2020  . Low vitamin B12 level 05/31/2020  . Ankle sprain 05/31/2020  . Constipation by delayed colonic transit 05/31/2020  . Degenerative spondylolisthesis 04/11/2020  . 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 DDD (degenerative disc disease), lumbar 01/20/2020  . Left lumbar radiculitis 01/20/2020  . Vitamin D deficiency 01/15/2020  . Low back pain 01/12/2020  . IBS (irritable bowel syndrome) 01/12/2020  . Nephrolithiasis 01/12/2020  . Erectile dysfunction 01/12/2020  . GERD (gastroesophageal reflux disease) 01/12/2020    Past Surgical History:  Procedure Laterality Date  . COLONOSCOPY    . TONSILLECTOMY    . VASECTOMY         Family History  Problem Relation Age of Onset  . Hypertension Father   . Heart attack Father   . Diabetes Sister     Social History   Tobacco Use  . Smoking status: Never Smoker  . Smokeless tobacco: Never Used  Vaping Use  . Vaping Use: Never used  Substance Use Topics  . Alcohol use: Never  . Drug use: Never    Home  Medications Prior to Admission medications   Medication Sig Start Date End Date Taking? Authorizing Provider  baclofen (LIORESAL) 10 MG tablet Take 10 mg by mouth 3 (three) times daily.    [provider]  Cholecalciferol (VITAMIN D3) 50 MCG (2000 UT) capsule Take 1 capsule (2,000 Units total) by mouth daily. 05/31/20   Plotnikov, Evie Lacks, MD  Cyanocobalamin (VITAMIN B-12) 1000 MCG SUBL Place 1 tablet (1,000 mcg total) under the tongue daily. 05/31/20   Plotnikov, Evie Lacks, MD  esomeprazole (NEXIUM) 40 MG capsule Take 1 capsule (40 mg total) by mouth daily. 02/25/20 02/24/21  Plotnikov, Evie Lacks, MD  fluticasone (FLONASE) 50 MCG/ACT nasal spray Place 2 sprays into both nostrils daily. 09/26/20   Plotnikov, Evie Lacks, MD  naproxen (NAPROSYN) 500 MG tablet Take 1 tablet (500 mg total) by mouth 2 (two) times daily as needed for moderate pain. 09/26/20   Plotnikov, Evie Lacks, MD  oxyCODONE 10 MG TABS Take 1 tablet (10 mg total) by mouth every 3 (three) hours as needed for severe pain ((score 7 to 10)). 04/13/20   [name], MD  pregabalin (LYRICA) 150 MG capsule Take 1 capsule (150 mg total) by mouth 2 (two) times daily. 06/28/20   Gregor Hams, MD    Allergies    Gabapentin  Review of Systems   Review of Systems  All other systems reviewed and are negative.   Physical Exam Updated Vital Signs BP (!) 150/94 (BP Location: Left Arm)   Pulse 96   Temp 98.5 F (36.9 C) (Oral)   Resp 16   SpO2 95%   Physical Exam Vitals and nursing note reviewed.  Constitutional:      General: He is not in acute distress.    Appearance: Normal appearance. He is well-developed. He is not toxic-appearing.  HENT:     Head: Normocephalic and atraumatic.  Eyes:     General: Lids are normal.     Conjunctiva/sclera: Conjunctivae normal.     Pupils: Pupils are equal, round, and reactive to light.  Neck:     Thyroid: No thyroid mass.     Trachea: No tracheal deviation.  Cardiovascular:     Rate  and Rhythm: Normal rate and regular rhythm.     Heart sounds: Normal heart sounds. No murmur heard. No gallop.   Pulmonary:     Effort: Pulmonary effort is normal. No respiratory distress.     Breath sounds: Normal breath sounds. No stridor. No decreased breath sounds, wheezing, rhonchi or rales.  Abdominal:     General: Bowel sounds are normal. There is no distension.     Palpations: Abdomen is soft.     Tenderness: There is abdominal tenderness in the right upper quadrant. There is guarding. There is no rebound.    Musculoskeletal:        General: No tenderness. Normal range of motion.     Cervical back: Normal range of motion and neck supple.  Skin:    General: Skin is warm and dry.     Findings: No abrasion or rash.  Neurological:     Mental Status: He is alert and oriented to person, place, and time.     GCS: GCS eye subscore is 4. GCS verbal subscore is 5. GCS motor subscore is 6.     Cranial Nerves: No cranial nerve deficit.     Sensory: No sensory deficit.  Psychiatric:        Speech: Speech normal.        Behavior: Behavior normal.     ED Results / Procedures / Treatments   Labs (all labs ordered are listed, but only abnormal results are displayed) Labs Reviewed  URINALYSIS, ROUTINE W REFLEX MICROSCOPIC - Abnormal; Notable for the following components:      Result Value   Hgb urine dipstick SMALL (*)    All other components within normal limits  TROPONIN I (HIGH SENSITIVITY)  TROPONIN I (HIGH SENSITIVITY)    EKG EKG Interpretation  Date/Time:  Friday September 30 2020 16:32:00 EDT Ventricular Rate:  95 PR Interval:  141 QRS Duration: 86 QT Interval:  339 QTC Calculation: 427 R Axis:   45 Text Interpretation: Sinus rhythm Nonspecific T abnormalities, diffuse leads 12 Lead; Mason-Likar No significant change since last tracing Confirmed by Lacretia Leigh ([id number]) on 09/30/2020 5:02:50 PM   Radiology DG Chest 2 View  Result Date: 09/30/2020 CLINICAL DATA:  Chest  pain with right upper quadrant abdominal pain, nausea and vomiting for 3 days. EXAM: CHEST - 2 VIEW COMPARISON:  Radiographs 09/27/2020. FINDINGS: The heart size and mediastinal contours are normal. The lungs are clear. There is no pleural effusion or pneumothorax. No acute osseous findings are identified. IMPRESSION: Stable chest.  No active cardiopulmonary process. Electronically Signed   By: Richardean Sale M.D.   On: 09/30/2020 18:03   US Abdomen Limited RUQ (  LIVER/GB)  Result Date: 09/30/2020 CLINICAL DATA:  Right upper quadrant pain for 1 month. EXAM: ULTRASOUND ABDOMEN LIMITED RIGHT UPPER QUADRANT COMPARISON:  CT stone study 07/10/2017. CT abdomen and pelvis without and with contrast 10/25/2009 FINDINGS: Gallbladder: Gallbladder is diffusely filled with sludge. Shadowing stones measuring up to 6 mm are present. Wall thickness is within normal limits at 2 mm. No sonographic Percell Miller sign is reported. Common bile duct: Diameter: 3 mm, within normal limits. Liver: Focal hyperechoic lesion in the left lobe the liver measures up to 10 mm. No other focal lesions are present. Parenchyma is mildly echogenic. Portal vein is patent on color Doppler imaging with normal direction of blood flow towards the liver. Other: None. IMPRESSION: 1. Cholelithiasis without evidence for cholecystitis. 2. Diffuse gallbladder sludge. 3. 10 mm hyperechoic lesion in the left lobe of the liver likely represents a small hemangioma. Two increased diffuse parenchymal echogenicity of the liver suggesting hepatic steatosis. Electronically Signed   By: San Morelle M.D.   On: 09/30/2020 19:07    Procedures Procedures   Medications Ordered in ED Medications  lactated ringers infusion (has no administration in time range)  HYDROmorphone (DILAUDID) injection 0.5 mg (has no administration in time range)    ED Course  I have reviewed the triage vital signs and the nursing notes.  Pertinent labs & imaging results that were  available during my care of the patient were reviewed by me and considered in my medical decision making (see chart for details).    MDM Rules/Calculators/A&P                         Patient given IV fluids and treated for pain here.  Ultrasound of right upper quadrant consistent with cholelithiasis along with sludge.  Discussed with Dr. Brantley Stage in general surgery will coordinate patient Final Clinical Impression(s) / ED Diagnoses Final diagnoses:  RUQ pain    Rx / DC Orders ED Discharge Orders    None       Lacretia Leigh, MD 09/30/20 1932

## 2020-10-01 ENCOUNTER — Encounter (HOSPITAL_COMMUNITY): Payer: Self-pay

## 2020-10-01 ENCOUNTER — Inpatient Hospital Stay (HOSPITAL_COMMUNITY): Payer: 59 | Admitting: Certified Registered Nurse Anesthetist

## 2020-10-01 ENCOUNTER — Inpatient Hospital Stay: Admit: 2020-10-01 | Payer: 59 | Admitting: General Surgery

## 2020-10-01 ENCOUNTER — Encounter (HOSPITAL_COMMUNITY): Admission: EM | Disposition: A | Discharge status: Home / Self Care | Payer: Self-pay | Source: Home / Self Care

## 2020-10-01 HISTORY — PX: CHOLECYSTECTOMY: SHX55

## 2020-10-01 LAB — URINE CULTURE

## 2020-10-01 LAB — SURGICAL PCR SCREEN
MRSA, PCR: NEGATIVE
Staphylococcus aureus: POSITIVE — AB

## 2020-10-01 LAB — HIV ANTIBODY (ROUTINE TESTING W REFLEX): HIV Screen 4th Generation wRfx: NONREACTIVE

## 2020-10-01 SURGERY — LAPAROSCOPIC CHOLECYSTECTOMY WITH INTRAOPERATIVE CHOLANGIOGRAM
Anesthesia: General | Site: Abdomen

## 2020-10-01 MED ORDER — ROCURONIUM BROMIDE 10 MG/ML (PF) SYRINGE
PREFILLED_SYRINGE | INTRAVENOUS | Status: DC | PRN
  Administered 2020-10-01: 40 mg via INTRAVENOUS
  Administered 2020-10-01: 20 mg via INTRAVENOUS

## 2020-10-01 MED ORDER — ALUM & MAG HYDROXIDE-SIMETH 200-200-20 MG/5ML PO SUSP
30.0000 mL | Freq: Four times a day (QID) | ORAL | Status: DC | PRN
  Administered 2020-10-01 – 2020-10-03 (×2): 30 mL via ORAL
  Filled 2020-10-01 (×3): qty 30

## 2020-10-01 MED ORDER — LACTATED RINGERS IV SOLN: INTRAVENOUS | Status: DC | PRN

## 2020-10-01 MED ORDER — PROPOFOL 10 MG/ML IV BOLUS
INTRAVENOUS | Status: AC
  Filled 2020-10-01: qty 20

## 2020-10-01 MED ORDER — SUGAMMADEX SODIUM 200 MG/2ML IV SOLN
INTRAVENOUS | Status: DC | PRN
  Administered 2020-10-01: 400 mg via INTRAVENOUS

## 2020-10-01 MED ORDER — KETOROLAC TROMETHAMINE 30 MG/ML IJ SOLN
INTRAMUSCULAR | Status: AC
  Filled 2020-10-01: qty 1

## 2020-10-01 MED ORDER — AMISULPRIDE (ANTIEMETIC) 5 MG/2ML IV SOLN: 10.0000 mg | Freq: Once | INTRAVENOUS | Status: DC | PRN

## 2020-10-01 MED ORDER — HYDROMORPHONE HCL 1 MG/ML IJ SOLN
INTRAMUSCULAR | Status: AC
  Filled 2020-10-01: qty 1

## 2020-10-01 MED ORDER — PROPOFOL 10 MG/ML IV BOLUS
INTRAVENOUS | Status: DC | PRN
  Administered 2020-10-01: 200 mg via INTRAVENOUS

## 2020-10-01 MED ORDER — OXYCODONE HCL 5 MG/5ML PO SOLN
5.0000 mg | Freq: Once | ORAL | Status: DC | PRN
Start: 2020-10-01 — End: 2020-10-01

## 2020-10-01 MED ORDER — FENTANYL CITRATE (PF) 250 MCG/5ML IJ SOLN
INTRAMUSCULAR | Status: DC | PRN
  Administered 2020-10-01 (×2): 50 ug via INTRAVENOUS
  Administered 2020-10-01: 150 ug via INTRAVENOUS

## 2020-10-01 MED ORDER — OXYCODONE-ACETAMINOPHEN 5-325 MG PO TABS
1.0000 | ORAL_TABLET | ORAL | Status: DC | PRN
  Administered 2020-10-01: 1 via ORAL
  Administered 2020-10-02 – 2020-10-03 (×2): 2 via ORAL
  Administered 2020-10-04: 1 via ORAL
  Filled 2020-10-01: qty 1
  Filled 2020-10-01: qty 2
  Filled 2020-10-01: qty 1
  Filled 2020-10-01 (×2): qty 2

## 2020-10-01 MED ORDER — FENTANYL CITRATE (PF) 250 MCG/5ML IJ SOLN
INTRAMUSCULAR | Status: AC
  Filled 2020-10-01: qty 5

## 2020-10-01 MED ORDER — HYDROMORPHONE HCL 1 MG/ML IJ SOLN
0.2500 mg | INTRAMUSCULAR | Status: DC | PRN
  Administered 2020-10-01 (×2): 0.5 mg via INTRAVENOUS

## 2020-10-01 MED ORDER — PROMETHAZINE HCL 25 MG/ML IJ SOLN
6.2500 mg | INTRAMUSCULAR | Status: DC | PRN
Start: 2020-10-01 — End: 2020-10-01

## 2020-10-01 MED ORDER — FENTANYL CITRATE (PF) 100 MCG/2ML IJ SOLN
25.0000 ug | INTRAMUSCULAR | Status: DC | PRN
Start: 2020-10-01 — End: 2020-10-01
  Administered 2020-10-01 (×3): 50 ug via INTRAVENOUS

## 2020-10-01 MED ORDER — FENTANYL CITRATE (PF) 100 MCG/2ML IJ SOLN
INTRAMUSCULAR | Status: AC
  Filled 2020-10-01: qty 2

## 2020-10-01 MED ORDER — ACETAMINOPHEN 10 MG/ML IV SOLN
INTRAVENOUS | Status: AC
  Filled 2020-10-01: qty 100

## 2020-10-01 MED ORDER — LIDOCAINE HCL (CARDIAC) PF 100 MG/5ML IV SOSY
PREFILLED_SYRINGE | INTRAVENOUS | Status: DC | PRN
  Administered 2020-10-01: 60 mg via INTRAVENOUS

## 2020-10-01 MED ORDER — ONDANSETRON HCL 4 MG/2ML IJ SOLN
INTRAMUSCULAR | Status: DC | PRN
  Administered 2020-10-01: 4 mg via INTRAVENOUS

## 2020-10-01 MED ORDER — BUPIVACAINE-EPINEPHRINE 0.25% -1:200000 IJ SOLN
INTRAMUSCULAR | Status: AC
  Filled 2020-10-01: qty 1

## 2020-10-01 MED ORDER — SUCCINYLCHOLINE CHLORIDE 20 MG/ML IJ SOLN
INTRAMUSCULAR | Status: DC | PRN
  Administered 2020-10-01: 100 mg via INTRAVENOUS

## 2020-10-01 MED ORDER — MIDAZOLAM HCL 2 MG/2ML IJ SOLN
INTRAMUSCULAR | Status: AC
  Filled 2020-10-01: qty 2

## 2020-10-01 MED ORDER — ACETAMINOPHEN 10 MG/ML IV SOLN
1000.0000 mg | Freq: Once | INTRAVENOUS | Status: DC | PRN
  Administered 2020-10-01: 1000 mg via INTRAVENOUS

## 2020-10-01 MED ORDER — OXYCODONE HCL 5 MG PO TABS: 5.0000 mg | ORAL_TABLET | Freq: Once | ORAL | Status: DC | PRN

## 2020-10-01 MED ORDER — DEXAMETHASONE SODIUM PHOSPHATE 10 MG/ML IJ SOLN
INTRAMUSCULAR | Status: DC | PRN
  Administered 2020-10-01: 10 mg via INTRAVENOUS

## 2020-10-01 MED ORDER — BUPIVACAINE-EPINEPHRINE 0.25% -1:200000 IJ SOLN
INTRAMUSCULAR | Status: DC | PRN
  Administered 2020-10-01: 20 mL

## 2020-10-01 MED ORDER — MIDAZOLAM HCL 2 MG/2ML IJ SOLN
INTRAMUSCULAR | Status: DC | PRN
  Administered 2020-10-01: 2 mg via INTRAVENOUS

## 2020-10-01 MED ORDER — KETOROLAC TROMETHAMINE 30 MG/ML IJ SOLN
30.0000 mg | Freq: Once | INTRAMUSCULAR | Status: AC
  Administered 2020-10-01: 30 mg via INTRAVENOUS

## 2020-10-01 MED ORDER — LACTATED RINGERS IV SOLN
INTRAVENOUS | Status: AC | PRN
  Administered 2020-10-01: 1000 mL

## 2020-10-01 MED ORDER — AMISULPRIDE (ANTIEMETIC) 5 MG/2ML IV SOLN
INTRAVENOUS | Status: AC
  Filled 2020-10-01: qty 4

## 2020-10-01 SURGICAL SUPPLY — 36 items
ADH SKN CLS APL DERMABOND .7 (GAUZE/BANDAGES/DRESSINGS) ×1
APL PRP STRL LF DISP 70% ISPRP (MISCELLANEOUS) ×1
APPLIER CLIP 5 13 M/L LIGAMAX5 (MISCELLANEOUS) ×2
APR CLP MED LRG 5 ANG JAW (MISCELLANEOUS) ×1
BAG SPEC RTRVL LRG 6X4 10 (ENDOMECHANICALS) ×1
CABLE HIGH FREQUENCY MONO STRZ (ELECTRODE) ×2 IMPLANT
CATH REDDICK CHOLANGI 4FR 50CM (CATHETERS) ×2 IMPLANT
CHLORAPREP W/TINT 26 (MISCELLANEOUS) ×2 IMPLANT
CLIP APPLIE 5 13 M/L LIGAMAX5 (MISCELLANEOUS) ×1 IMPLANT
COVER MAYO STAND STRL (DRAPES) ×2 IMPLANT
COVER WAND RF STERILE (DRAPES) IMPLANT
DECANTER SPIKE VIAL GLASS SM (MISCELLANEOUS) ×2 IMPLANT
DERMABOND ADVANCED (GAUZE/BANDAGES/DRESSINGS) ×1
DERMABOND ADVANCED .7 DNX12 (GAUZE/BANDAGES/DRESSINGS) ×1 IMPLANT
DRAPE C-ARM 42X120 X-RAY (DRAPES) ×2 IMPLANT
ELECT REM PT RETURN 15FT ADLT (MISCELLANEOUS) ×2 IMPLANT
ENDOLOOP SUT PDS II  0 18 (SUTURE) ×2
ENDOLOOP SUT PDS II 0 18 (SUTURE) IMPLANT
GLOVE SURG ENC MOIS LTX SZ7.5 (GLOVE) ×2 IMPLANT
GOWN STRL REUS W/TWL XL LVL3 (GOWN DISPOSABLE) ×6 IMPLANT
HEMOSTAT SURGICEL 4X8 (HEMOSTASIS) IMPLANT
IV CATH 14GX2 1/4 (CATHETERS) ×2 IMPLANT
KIT BASIN OR (CUSTOM PROCEDURE TRAY) ×2 IMPLANT
KIT TURNOVER KIT A (KITS) ×2 IMPLANT
PENCIL SMOKE EVACUATOR (MISCELLANEOUS) IMPLANT
POUCH SPECIMEN RETRIEVAL 10MM (ENDOMECHANICALS) ×2 IMPLANT
SCISSORS LAP 5X35 DISP (ENDOMECHANICALS) ×2 IMPLANT
SET IRRIG TUBING LAPAROSCOPIC (IRRIGATION / IRRIGATOR) ×2 IMPLANT
SET TUBE SMOKE EVAC HIGH FLOW (TUBING) ×2 IMPLANT
SLEEVE XCEL OPT CAN 5 100 (ENDOMECHANICALS) ×4 IMPLANT
SUT MNCRL AB 4-0 PS2 18 (SUTURE) ×2 IMPLANT
TOWEL OR 17X26 10 PK STRL BLUE (TOWEL DISPOSABLE) ×2 IMPLANT
TOWEL OR NON WOVEN STRL DISP B (DISPOSABLE) ×2 IMPLANT
TRAY LAPAROSCOPIC (CUSTOM PROCEDURE TRAY) ×2 IMPLANT
TROCAR BLADELESS OPT 5 100 (ENDOMECHANICALS) ×2 IMPLANT
TROCAR XCEL BLUNT TIP 100MML (ENDOMECHANICALS) ×2 IMPLANT

## 2020-10-01 NOTE — Op Note (Signed)
10/01/2020  1:26 PM  PATIENT:  Adam Vaughn  45 y.o. male  PRE-OPERATIVE DIAGNOSIS:  gallstones  POST-OPERATIVE DIAGNOSIS:  Cholecystitis with cholelithiasis  PROCEDURE:  Procedure(s): LAPAROSCOPIC CHOLECYSTECTOMY (N/A)  SURGEON:  Surgeon(s) and Role:    * Jovita Kussmaul, MD - Primary  PHYSICIAN ASSISTANT:   ASSISTANTS: none   ANESTHESIA:   local and general  EBL:  minimal   BLOOD ADMINISTERED:none  DRAINS: none   LOCAL MEDICATIONS USED:  MARCAINE     SPECIMEN:  Source of Specimen:  gallbladder  DISPOSITION OF SPECIMEN:  PATHOLOGY  COUNTS:  YES  TOURNIQUET:  * No tourniquets in log *  DICTATION: .Dragon Dictation     Procedure: After informed consent was obtained the patient was brought to the operating room and placed in the supine position on the operating room table. After adequate induction of general anesthesia the patient's abdomen was prepped with ChloraPrep allowed to dry and draped in usual sterile manner. An appropriate timeout was performed. The area below the umbilicus was infiltrated with quarter percent  Marcaine. A small incision was made with a 15 blade knife. The incision was carried down through the subcutaneous tissue bluntly with a hemostat and [employer] retractors. The linea alba was identified. The linea alba was incised with a 15 blade knife and each side was grasped with Coker clamps. The preperitoneal space was then probed with a hemostat until the peritoneum was opened and access was gained to the abdominal cavity. A 0 Vicryl pursestring stitch was placed in the fascia surrounding the opening. A Hassan cannula was then placed through the opening and anchored in place with the previously placed Vicryl purse string stitch. The abdomen was insufflated with carbon dioxide without difficulty. A laparoscope was inserted through the [facility] cannula in the right upper quadrant was inspected. Next the epigastric region was infiltrated with % Marcaine. A  small incision was made with a 15 blade knife. A 5 mm port was placed bluntly through this incision into the abdominal cavity under direct vision. Next 2 sites were chosen laterally on the right side of the abdomen for placement of 5 mm ports. Each of these areas was infiltrated with quarter percent Marcaine. Small stab incisions were made with a 15 blade knife. 5 mm ports were then placed bluntly through these incisions into the abdominal cavity under direct vision without difficulty. A blunt grasper was placed through the lateralmost 5 mm port and used to grasp the dome of the gallbladder and elevated anteriorly and superiorly. Another blunt grasper was placed through the other 5 mm port and used to retract the body and neck of the gallbladder. A dissector was placed through the epigastric port and using the electrocautery the peritoneal reflection at the gallbladder neck was opened. Blunt dissection was then carried out in this area until the gallbladder neck-cystic duct junction was readily identified and a good window was created. The base of the gallbladder was very inflamed and not malleable. It was not amenable to obtaining a cholangiogram so the dissection was kept close to the gallbladder where the dissection was safe. The cystic duct was controlled with 2 clips and the neck of the gallbladder was divided. The cystic duct stump was then also controlled with a 0 PDS endoloop. The cystic artery with a posterior branch was also identified and dissected bluntly in a circumferential manner until a good window was created. The artery was controlled with 2 clips proximally and one distally on each branch and then  divided between the two. The gallbladder was then separated from the liver by sharp dissection with the electrocautery.  A laparoscopic bag was inserted through the hassan port. The laparoscope was moved to the epigastric port. The gallbladder was placed within the bag and the bag was sealed.  The bag  with the gallbladder was then removed with the [facility] cannula through the infraumbilical port without difficulty. The fascial defect was then closed with the previously placed Vicryl pursestring stitch as well as with another figure-of-eight 0 Vicryl stitch. The liver bed was inspected again and found to be hemostatic. The abdomen was irrigated with copious amounts of saline until the effluent was clear. The ports were then removed under direct vision without difficulty and were found to be hemostatic. The gas was allowed to escape. The skin incisions were all closed with interrupted 4-0 Monocryl subcuticular stitches. Dermabond dressings were applied. The patient tolerated the procedure well. At the end of the case all needle sponge and instrument counts were correct. The patient was then awakened and taken to recovery in stable condition  PLAN OF CARE: Admit to inpatient   PATIENT DISPOSITION:  PACU - hemodynamically stable.   Delay start of Pharmacological VTE agent (>24hrs) due to surgical blood loss or risk of bleeding: no

## 2020-10-01 NOTE — Anesthesia Postprocedure Evaluation (Signed)
Anesthesia Post Note  Patient: Adam Vaughn  Procedure(s) Performed: LAPAROSCOPIC CHOLECYSTECTOMY (N/A Abdomen)     Patient location during evaluation: PACU Anesthesia Type: General Level of consciousness: awake Pain management: pain level controlled Vital Signs Assessment: post-procedure vital signs reviewed and stable Respiratory status: spontaneous breathing, nonlabored ventilation, respiratory function stable and patient connected to nasal cannula oxygen Cardiovascular status: blood pressure returned to baseline and stable Postop Assessment: no apparent nausea or vomiting Anesthetic complications: no   No complications documented.  Last Vitals:  Vitals:   10/01/20 1437 10/01/20 1454  BP:  133/69  Pulse: 70 80  Resp: 12 15  Temp:  36.6 C  SpO2: 95% 98%    Last Pain:  Vitals:   10/01/20 1454  TempSrc: Oral  PainSc: 4                  Lovinia Snare P Yalexa Blust

## 2020-10-01 NOTE — Progress Notes (Signed)
Day of Surgery   Subjective/Chief Complaint: Feels better   Objective: Vital signs in last 24 hours: Temp:  [97.5 F (36.4 C)-98.5 F (36.9 C)] 97.8 F (36.6 C) (04/02 0941) Pulse Rate:  [62-97] 69 (04/02 0941) Resp:  [16-18] 18 (04/02 0941) BP: (102-150)/(66-98) 129/86 (04/02 0941) SpO2:  [95 %-99 %] 98 % (04/02 0941) Weight:  [92.5 kg] 92.5 kg (04/01 1419) Last BM Date: 10/01/20  Intake/Output from previous day: No intake/output data recorded. Intake/Output this shift: No intake/output data recorded.  General appearance: alert and cooperative Resp: clear to auscultation bilaterally Cardio: regular rate and rhythm GI: soft, mild RUQ tenderness  Lab Results:  Recent Labs    09/30/20 1506  WBC 5.7  HGB 15.0  HCT 43.8  PLT 256.0   BMET Recent Labs    09/30/20 1506  NA 140  K 3.5  CL 104  CO2 27  GLUCOSE 107*  BUN 13  CREATININE 0.92  CALCIUM 9.2   PT/INR No results for input(s): LABPROT, INR in the last 72 hours. ABG No results for input(s): PHART, HCO3 in the last 72 hours.  Invalid input(s): PCO2, PO2  Studies/Results: DG Chest 2 View  Result Date: 09/30/2020 CLINICAL DATA:  Chest pain with right upper quadrant abdominal pain, nausea and vomiting for 3 days. EXAM: CHEST - 2 VIEW COMPARISON:  Radiographs 09/27/2020. FINDINGS: The heart size and mediastinal contours are normal. The lungs are clear. There is no pleural effusion or pneumothorax. No acute osseous findings are identified. IMPRESSION: Stable chest.  No active cardiopulmonary process. Electronically Signed   By: Richardean Sale M.D.   On: 09/30/2020 18:03   US Abdomen Limited RUQ (LIVER/GB)  Result Date: 09/30/2020 CLINICAL DATA:  Right upper quadrant pain for 1 month. EXAM: ULTRASOUND ABDOMEN LIMITED RIGHT UPPER QUADRANT COMPARISON:  CT stone study 07/10/2017. CT abdomen and pelvis without and with contrast 10/25/2009 FINDINGS: Gallbladder: Gallbladder is diffusely filled with sludge. Shadowing  stones measuring up to 6 mm are present. Wall thickness is within normal limits at 2 mm. No sonographic Percell Miller sign is reported. Common bile duct: Diameter: 3 mm, within normal limits. Liver: Focal hyperechoic lesion in the left lobe the liver measures up to 10 mm. No other focal lesions are present. Parenchyma is mildly echogenic. Portal vein is patent on color Doppler imaging with normal direction of blood flow towards the liver. Other: None. IMPRESSION: 1. Cholelithiasis without evidence for cholecystitis. 2. Diffuse gallbladder sludge. 3. 10 mm hyperechoic lesion in the left lobe of the liver likely represents a small hemangioma. Two increased diffuse parenchymal echogenicity of the liver suggesting hepatic steatosis. Electronically Signed   By: San Morelle M.D.   On: 09/30/2020 19:07    Anti-infectives: Anti-infectives (From admission, onward)   Start     Dose/Rate Route Frequency Ordered Stop   09/30/20 2330  cefTRIAXone (ROCEPHIN) 2 g in sodium chloride 0.9 % 100 mL IVPB        2 g 200 mL/hr over 30 Minutes Intravenous Every 24 hours 09/30/20 2317        Assessment/Plan: s/p Procedure(s): LAPAROSCOPIC CHOLECYSTECTOMY WITH INTRAOPERATIVE CHOLANGIOGRAM (N/A) Plan for lap chole with ioc today for symptomatic cholelithiasis. Risks and benefits of the surgery discussed with pt including cbd injury and need for open surgery and he understands and wishes to proceed.  LOS: 1 day    Autumn Messing III 10/01/2020

## 2020-10-01 NOTE — ED Notes (Signed)
Medications given, updated Hildred Alamin, Inpatient RN. Pt to be transported to inpatient room by ED staff.

## 2020-10-01 NOTE — Anesthesia Procedure Notes (Signed)
Procedure Name: Intubation Date/Time: 10/01/2020 12:14 PM Performed by: Mitzie Na, CRNA Pre-anesthesia Checklist: Patient identified, Emergency Drugs available, Suction available and Patient being monitored Patient Re-evaluated:Patient Re-evaluated prior to induction Oxygen Delivery Method: Circle system utilized Preoxygenation: Pre-oxygenation with 100% oxygen Induction Type: IV induction, Rapid sequence and Cricoid Pressure applied Laryngoscope Size: Mac and 3 Grade View: Grade III Tube type: Oral Tube size: 7.5 mm Number of attempts: 1 Airway Equipment and Method: Stylet and Oral airway Placement Confirmation: ETT inserted through vocal cords under direct vision,  positive ETCO2 and breath sounds checked- equal and bilateral Secured at: 25 cm Tube secured with: Tape Dental Injury: Teeth and Oropharynx as per pre-operative assessment

## 2020-10-01 NOTE — Transfer of Care (Signed)
Immediate Anesthesia Transfer of Care Note  Patient: Adam Vaughn  Procedure(s) Performed: LAPAROSCOPIC CHOLECYSTECTOMY (N/A Abdomen)  Patient Location: PACU  Anesthesia Type:General  Level of Consciousness: awake, alert , oriented and patient cooperative  Airway & Oxygen Therapy: Patient Spontanous Breathing and Patient connected to face mask oxygen  Post-op Assessment: Report given to RN, Post -op Vital signs reviewed and stable and Patient moving all extremities  Post vital signs: Reviewed and stable  Last Vitals:  Vitals Value Taken Time  BP 130/82 10/01/20 1347  Temp 36.7 C 10/01/20 1334  Pulse 76 10/01/20 1350  Resp 11 10/01/20 1350  SpO2 98 % 10/01/20 1350  Vitals shown include unvalidated device data.  Last Pain:  Vitals:   10/01/20 1347  TempSrc:   PainSc: 7          Complications: No complications documented.

## 2020-10-01 NOTE — Anesthesia Preprocedure Evaluation (Addendum)
Anesthesia Evaluation  Patient identified by MRN, date of birth, ID band Patient awake    Reviewed: Allergy & Precautions, NPO status , Patient's Chart, lab work & pertinent test results  Airway Mallampati: I  TM Distance: >3 FB Neck ROM: Full    Dental no notable dental hx.    Pulmonary neg pulmonary ROS,    Pulmonary exam normal breath sounds clear to auscultation       Cardiovascular hypertension, Normal cardiovascular exam Rhythm:Regular Rate:Normal     Neuro/Psych negative neurological ROS  negative psych ROS   GI/Hepatic Neg liver ROS, GERD  Medicated and Controlled,  Endo/Other  negative endocrine ROS  Renal/GU Renal disease     Musculoskeletal  (+) Arthritis ,   Abdominal   Peds  Hematology negative hematology ROS (+)   Anesthesia Other Findings Gallstones  Reproductive/Obstetrics                            Anesthesia Physical Anesthesia Plan  ASA: II  Anesthesia Plan: General   Post-op Pain Management:    Induction: Intravenous  PONV Risk Score and Plan: 3 and Ondansetron, Dexamethasone, Midazolam and Treatment may vary due to age or medical condition  Airway Management Planned: Oral ETT  Additional Equipment:   Intra-op Plan:   Post-operative Plan: Extubation in OR  Informed Consent: I have reviewed the patients History and Physical, chart, labs and discussed the procedure including the risks, benefits and alternatives for the proposed anesthesia with the patient or authorized representative who has indicated his/her understanding and acceptance.     Dental advisory given  Plan Discussed with: CRNA  Anesthesia Plan Comments:         Anesthesia Quick Evaluation

## 2020-10-02 ENCOUNTER — Encounter (HOSPITAL_COMMUNITY): Payer: Self-pay | Admitting: General Surgery

## 2020-10-02 ENCOUNTER — Inpatient Hospital Stay (HOSPITAL_COMMUNITY): Payer: 59

## 2020-10-02 LAB — COMPREHENSIVE METABOLIC PANEL
ALT: 433 U/L — ABNORMAL HIGH (ref 0–44)
AST: 185 U/L — ABNORMAL HIGH (ref 15–41)
Albumin: 3.4 g/dL — ABNORMAL LOW (ref 3.5–5.0)
Alkaline Phosphatase: 133 U/L — ABNORMAL HIGH (ref 38–126)
Anion gap: 7 (ref 5–15)
BUN: 9 mg/dL (ref 6–20)
CO2: 26 mmol/L (ref 22–32)
Calcium: 9 mg/dL (ref 8.9–10.3)
Chloride: 104 mmol/L (ref 98–111)
Creatinine, Ser: 0.81 mg/dL (ref 0.61–1.24)
GFR, Estimated: >60 mL/min (ref >60)
Glucose, Bld: 151 mg/dL — ABNORMAL HIGH (ref 70–99)
Potassium: 4.1 mmol/L (ref 3.5–5.1)
Sodium: 137 mmol/L (ref 135–145)
Total Bilirubin: 2.6 mg/dL — ABNORMAL HIGH (ref 0.3–1.2)
Total Protein: 6.3 g/dL — ABNORMAL LOW (ref 6.5–8.1)

## 2020-10-02 MED ORDER — FENTANYL CITRATE (PF) 100 MCG/2ML IJ SOLN: 12.5000 ug | INTRAMUSCULAR | Status: DC | PRN

## 2020-10-02 MED ORDER — KETOROLAC TROMETHAMINE 30 MG/ML IJ SOLN
30.0000 mg | Freq: Three times a day (TID) | INTRAMUSCULAR | Status: AC | PRN
  Administered 2020-10-02 – 2020-10-03 (×3): 30 mg via INTRAVENOUS
  Filled 2020-10-02 (×3): qty 1

## 2020-10-02 MED ORDER — GADOBUTROL 1 MMOL/ML IV SOLN
9.0000 mL | Freq: Once | INTRAVENOUS | Status: AC | PRN
  Administered 2020-10-02: 9 mL via INTRAVENOUS

## 2020-10-02 NOTE — Progress Notes (Signed)
1 Day Post-Op   Subjective/Chief Complaint: Complains of epigastric and right shoulder pain and nausea   Objective: Vital signs in last 24 hours: Temp:  [97.6 F (36.4 C)-98.7 F (37.1 C)] 97.8 F (36.6 C) (04/03 0603) Pulse Rate:  [64-88] 64 (04/03 0603) Resp:  [12-22] 16 (04/03 0603) BP: (99-137)/(53-86) 99/53 (04/03 0603) SpO2:  [95 %-100 %] 95 % (04/03 0603) Last BM Date: 10/01/20  Intake/Output from previous day: 04/02 0701 - 04/03 0700 In: 2373.3 [P.O.:390; I.V.:1783.3; IV Piggyback:200] Out: 385 [Urine:375; Blood:10] Intake/Output this shift: No intake/output data recorded.  General appearance: alert and cooperative Resp: clear to auscultation bilaterally Cardio: regular rate and rhythm GI: soft, appropriately tender RUQ  Lab Results:  Recent Labs    09/30/20 1506  WBC 5.7  HGB 15.0  HCT 43.8  PLT 256.0   BMET Recent Labs    09/30/20 1506 10/02/20 0355  NA 140 137  K 3.5 4.1  CL 104 104  CO2 27 26  GLUCOSE 107* 151*  BUN 13 9  CREATININE 0.92 0.81  CALCIUM 9.2 9.0   PT/INR No results for input(s): LABPROT, INR in the last 72 hours. ABG No results for input(s): PHART, HCO3 in the last 72 hours.  Invalid input(s): PCO2, PO2  Studies/Results: DG Chest 2 View  Result Date: 09/30/2020 CLINICAL DATA:  Chest pain with right upper quadrant abdominal pain, nausea and vomiting for 3 days. EXAM: CHEST - 2 VIEW COMPARISON:  Radiographs 09/27/2020. FINDINGS: The heart size and mediastinal contours are normal. The lungs are clear. There is no pleural effusion or pneumothorax. No acute osseous findings are identified. IMPRESSION: Stable chest.  No active cardiopulmonary process. Electronically Signed   By: Richardean Sale M.D.   On: 09/30/2020 18:03   US Abdomen Limited RUQ (LIVER/GB)  Result Date: 09/30/2020 CLINICAL DATA:  Right upper quadrant pain for 1 month. EXAM: ULTRASOUND ABDOMEN LIMITED RIGHT UPPER QUADRANT COMPARISON:  CT stone study 07/10/2017. CT  abdomen and pelvis without and with contrast 10/25/2009 FINDINGS: Gallbladder: Gallbladder is diffusely filled with sludge. Shadowing stones measuring up to 6 mm are present. Wall thickness is within normal limits at 2 mm. No sonographic Percell Miller sign is reported. Common bile duct: Diameter: 3 mm, within normal limits. Liver: Focal hyperechoic lesion in the left lobe the liver measures up to 10 mm. No other focal lesions are present. Parenchyma is mildly echogenic. Portal vein is patent on color Doppler imaging with normal direction of blood flow towards the liver. Other: None. IMPRESSION: 1. Cholelithiasis without evidence for cholecystitis. 2. Diffuse gallbladder sludge. 3. 10 mm hyperechoic lesion in the left lobe of the liver likely represents a small hemangioma. Two increased diffuse parenchymal echogenicity of the liver suggesting hepatic steatosis. Electronically Signed   By: San Morelle M.D.   On: 09/30/2020 19:07    Anti-infectives: Anti-infectives (From admission, onward)   Start     Dose/Rate Route Frequency Ordered Stop   09/30/20 2330  cefTRIAXone (ROCEPHIN) 2 g in sodium chloride 0.9 % 100 mL IVPB        2 g 200 mL/hr over 30 Minutes Intravenous Every 24 hours 09/30/20 2317        Assessment/Plan: s/p Procedure(s): LAPAROSCOPIC CHOLECYSTECTOMY (N/A) LFT's elevated today. will plan for get MRCP today since we could not get intraop cholangiogram to rule out cbd stone  Continue to work on pain control Clears until nausea improves  LOS: 2 days    Autumn Messing III 10/02/2020

## 2020-10-03 MED ORDER — POTASSIUM CHLORIDE IN NACL 20-0.45 MEQ/L-% IV SOLN
INTRAVENOUS | Status: DC
  Filled 2020-10-03 (×3): qty 1000

## 2020-10-03 MED ORDER — KETOROLAC TROMETHAMINE 15 MG/ML IJ SOLN
15.0000 mg | Freq: Four times a day (QID) | INTRAMUSCULAR | Status: DC | PRN
  Administered 2020-10-03 – 2020-10-04 (×4): 15 mg via INTRAVENOUS
  Filled 2020-10-03 (×4): qty 1

## 2020-10-03 MED ORDER — SODIUM CHLORIDE 0.9 % IV SOLN
25.0000 mg | Freq: Three times a day (TID) | INTRAVENOUS | Status: DC | PRN
  Administered 2020-10-03 (×2): 25 mg via INTRAVENOUS
  Filled 2020-10-03 (×4): qty 1

## 2020-10-03 NOTE — Progress Notes (Signed)
Assessment & Plan: POD#2 - status post lap cholecystectomy - Dr. Marlou Starks  10/01/2020  Elevated LFT's - MRCP negative 4/3  Persistent pain, nausea - Rx Toradol, Oxy, Zofran, Phenergan if needed  Ambulatory  Repeat labs in AM 4/5  Clear liquid diet - advance as tolerated        Armandina Gemma, MD       [facility]       Office: [phone]   Chief Complaint: Cholecystitis, cholelithiasis  Subjective: Patient with periumbilical pain, nausea.  Nurse in room.  Objective: Vital signs in last 24 hours: Temp:  [98.2 F (36.8 C)-98.8 F (37.1 C)] 98.2 F (36.8 C) (04/04 0536) Pulse Rate:  [61-72] 69 (04/04 0536) Resp:  [16-18] 18 (04/04 0536) BP: (121-133)/(69-79) 133/69 (04/04 0536) SpO2:  [97 %-98 %] 98 % (04/04 0536) Weight:  [92.5 kg] 92.5 kg (04/04 0536) Last BM Date: 10/01/20  Intake/Output from previous day: 04/03 0701 - 04/04 0700 In: 3060 [P.O.:1760; I.V.:1200; IV Piggyback:100] Out: -  Intake/Output this shift: No intake/output data recorded.  Physical Exam: HEENT - sclerae clear, mucous membranes moist Neck - soft Chest - clear bilaterally Cor - RRR Abdomen - soft, wounds dry and intact Ext - no edema, non-tender Neuro - alert & oriented, no focal deficits  Lab Results:  Recent Labs    09/30/20 1506  WBC 5.7  HGB 15.0  HCT 43.8  PLT 256.0   BMET Recent Labs    09/30/20 1506 10/02/20 0355  NA 140 137  K 3.5 4.1  CL 104 104  CO2 27 26  GLUCOSE 107* 151*  BUN 13 9  CREATININE 0.92 0.81  CALCIUM 9.2 9.0   PT/INR No results for input(s): LABPROT, INR in the last 72 hours. Comprehensive Metabolic Panel:    Component Value Date/Time   NA 137 10/02/2020 0355   NA 140 09/30/2020 1506   K 4.1 10/02/2020 0355   K 3.5 09/30/2020 1506   CL 104 10/02/2020 0355   CL 104 09/30/2020 1506   CO2 26 10/02/2020 0355   CO2 27 09/30/2020 1506   BUN 9 10/02/2020 0355   BUN 13 09/30/2020 1506   CREATININE 0.81 10/02/2020 0355    CREATININE 0.92 09/30/2020 1506   GLUCOSE 151 (H) 10/02/2020 0355   GLUCOSE 107 (H) 09/30/2020 1506   CALCIUM 9.0 10/02/2020 0355   CALCIUM 9.2 09/30/2020 1506   AST 185 (H) 10/02/2020 0355   AST 101 (H) 09/30/2020 1506   ALT 433 (H) 10/02/2020 0355   ALT 518 (H) 09/30/2020 1506   ALKPHOS 133 (H) 10/02/2020 0355   ALKPHOS 166 (H) 09/30/2020 1506   BILITOT 2.6 (H) 10/02/2020 0355   BILITOT 1.2 09/30/2020 1506   PROT 6.3 (L) 10/02/2020 0355   PROT 7.1 09/30/2020 1506   ALBUMIN 3.4 (L) 10/02/2020 0355   ALBUMIN 4.3 09/30/2020 1506    Studies/Results: MR ABDOMEN MRCP W WO CONTAST  Result Date: 10/02/2020 CLINICAL DATA:  Abdominal pain. Concern for biliary obstruction. Cholecystectomy 10/01/2020 is EXAM: MRI ABDOMEN WITHOUT AND WITH CONTRAST (INCLUDING MRCP) TECHNIQUE: Multiplanar multisequence MR imaging of the abdomen was performed both before and after the administration of intravenous contrast. Heavily T2-weighted images of the biliary and pancreatic ducts were obtained, and three-dimensional MRCP images were rendered by post processing. CONTRAST:  66mL GADAVIST GADOBUTROL 1 MMOL/ML IV SOLN COMPARISON:  Ultrasound 09/30/2020 FINDINGS: Lower chest:  Lung bases are clear. Hepatobiliary: Postcholecystectomy. Expected postsurgical change in the gallbladder fossa. No  fluid collections. The common bile duct and common hepatic duct are normal. No filling defect within the common bile duct. No intrahepatic biliary duct dilatation. Several small lesions in the LEFT hepatic lobe which are hyperintense on T2 weighted imaging and have post-contrast enhancement pattern consistent with benign hemangioma. Pancreas: Normal pancreatic parenchymal intensity. No ductal dilatation or inflammation. Spleen: Normal spleen. Adrenals/urinary tract: Adrenal glands and kidneys are normal. Stomach/Bowel: Stomach and limited of the small bowel is unremarkable Vascular/Lymphatic: Abdominal aortic normal caliber. No  retroperitoneal periportal lymphadenopathy. Musculoskeletal: Several lesions in the lower thoracic spine and lumbar spine which are high signal intensity on T2 weighted imaging (image 19/series 17 example and image 29/17). Lesion is hyperintense on fat saturated serrated precontrast T1 weighted imaging. These in demonstrate postcontrast enhancement and are most suggestive benign hemangiomas. IMPRESSION: 1. No complication following cholecystectomy. 2. No evidence of choledocholithiasis. No intrahepatic or extrahepatic biliary duct dilatation 3. Benign hemangiomas within the liver and spine. Electronically Signed   By: Suzy Bouchard M.D.   On: 10/02/2020 13:11      Armandina Gemma 10/03/2020  Patient ID: Adam Vaughn, male   DOB: 02-05-1976, 45 y.o.   MRN: [id number]

## 2020-10-03 NOTE — Discharge Instructions (Signed)
[facility] ° °Please arrive at least 30 min before your appointment to complete your check in paperwork.  If you are unable to arrive 30 min prior to your appointment time we may have to cancel or reschedule you. °LAPAROSCOPIC SURGERY: POST OP INSTRUCTIONS °Always review your discharge instruction sheet given to you by the facility where your surgery was performed. °IF YOU HAVE DISABILITY OR FAMILY LEAVE FORMS, YOU MUST BRING THEM TO THE OFFICE FOR PROCESSING.   °DO NOT GIVE THEM TO YOUR DOCTOR. ° °PAIN CONTROL ° °1. First take acetaminophen (Tylenol) AND/or ibuprofen (Advil) to control your pain after surgery.  Follow directions on package.  Taking acetaminophen (Tylenol) and/or ibuprofen (Advil) regularly after surgery will help to control your pain and lower the amount of prescription pain medication you may need.  You should not take more than 4,000 mg (4 grams) of acetaminophen (Tylenol) in 24 hours.  You should not take ibuprofen (Advil), aleve, motrin, naprosyn or other NSAIDS if you have a history of stomach ulcers or chronic kidney disease.  °2. A prescription for pain medication may be given to you upon discharge.  Take your pain medication as prescribed, if you still have uncontrolled pain after taking acetaminophen (Tylenol) or ibuprofen (Advil). °3. Use ice packs to help control pain. °4. If you need a refill on your pain medication, please contact your pharmacy.  They will contact our office to request authorization. Prescriptions will not be filled after 5pm or on week-ends. ° °HOME MEDICATIONS °5. Take your usually prescribed medications unless otherwise directed. ° °DIET °6. You should follow a light diet the first few days after arrival home.  Be sure to include lots of fluids daily. Avoid fatty, fried foods.  ° °CONSTIPATION °7. It is common to experience some constipation after surgery and if you are taking pain medication.  Increasing fluid intake and taking a stool  softener (such as Colace) will usually help or prevent this problem from occurring.  A mild laxative (Milk of Magnesia or Miralax) should be taken according to package instructions if there are no bowel movements after 48 hours. ° °WOUND/INCISION CARE °8. Most patients will experience some swelling and bruising in the area of the incisions.  Ice packs will help.  Swelling and bruising can take several days to resolve.  °9. Unless discharge instructions indicate otherwise, follow guidelines below  °a. STERI-STRIPS - you may remove your outer bandages 48 hours after surgery, and you may shower at that time.  You have steri-strips (small skin tapes) in place directly over the incision.  These strips should be left on the skin for 7-10 days.   °b. DERMABOND/SKIN GLUE - you may shower in 24 hours.  The glue will flake off over the next 2-3 weeks. °10. Any sutures or staples will be removed at the office during your follow-up visit. ° °ACTIVITIES °11. You may resume regular (light) daily activities beginning the next day--such as daily self-care, walking, climbing stairs--gradually increasing activities as tolerated.  You may have sexual intercourse when it is comfortable.  Refrain from any heavy lifting or straining until approved by your doctor. °a. You may drive when you are no longer taking prescription pain medication, you can comfortably wear a seatbelt, and you can safely maneuver your car and apply brakes. ° °FOLLOW-UP °12. You should see your doctor in the office for a follow-up appointment approximately 2-3 weeks after your surgery.  You should have been given your post-op/follow-up appointment when   your surgery was scheduled.  If you did not receive a post-op/follow-up appointment, make sure that you call for this appointment within a day or two after you arrive home to insure a convenient appointment time. ° °OTHER INSTRUCTIONS ° °WHEN TO CALL YOUR DOCTOR: °1. Fever over 101.0 °2. Inability to  urinate °3. Continued bleeding from incision. °4. Increased pain, redness, or drainage from the incision. °5. Increasing abdominal pain ° °The clinic staff is available to answer your questions during regular business hours.  Please don’t hesitate to call and ask to speak to one of the nurses for clinical concerns.  If you have a medical emergency, go to the nearest emergency room or call 911.  A surgeon from [facility] is always on call at the hospital. °[address], [address], [place], [place]  [address] ? [address] [address], [place], [place]   [address] °[phone] ? [phone] ? FAX [phone] ° ° ° °

## 2020-10-04 LAB — COMPREHENSIVE METABOLIC PANEL
ALT: 324 U/L — ABNORMAL HIGH (ref 0–44)
AST: 88 U/L — ABNORMAL HIGH (ref 15–41)
Albumin: 3.3 g/dL — ABNORMAL LOW (ref 3.5–5.0)
Alkaline Phosphatase: 139 U/L — ABNORMAL HIGH (ref 38–126)
Anion gap: 9 (ref 5–15)
BUN: 10 mg/dL (ref 6–20)
CO2: 22 mmol/L (ref 22–32)
Calcium: 8.8 mg/dL — ABNORMAL LOW (ref 8.9–10.3)
Chloride: 107 mmol/L (ref 98–111)
Creatinine, Ser: 0.94 mg/dL (ref 0.61–1.24)
GFR, Estimated: >60 mL/min (ref >60)
Glucose, Bld: 89 mg/dL (ref 70–99)
Potassium: 4.6 mmol/L (ref 3.5–5.1)
Sodium: 138 mmol/L (ref 135–145)
Total Bilirubin: 2 mg/dL — ABNORMAL HIGH (ref 0.3–1.2)
Total Protein: 6.1 g/dL — ABNORMAL LOW (ref 6.5–8.1)

## 2020-10-04 LAB — CBC
HCT: 42.7 % (ref 39.0–52.0)
Hemoglobin: 13.7 g/dL (ref 13.0–17.0)
MCH: 29.7 pg (ref 26.0–34.0)
MCHC: 32.1 g/dL (ref 30.0–36.0)
MCV: 92.6 fL (ref 80.0–100.0)
Platelets: 93 10*3/uL — ABNORMAL LOW (ref 150–400)
RBC: 4.61 MIL/uL (ref 4.22–5.81)
RDW: 12.9 % (ref 11.5–15.5)
WBC: 4.2 10*3/uL (ref 4.0–10.5)
nRBC: 0 % (ref 0.0–0.2)

## 2020-10-04 LAB — SURGICAL PATHOLOGY

## 2020-10-04 MED ORDER — ONDANSETRON 4 MG PO TBDP: 4.0000 mg | ORAL_TABLET | Freq: Four times a day (QID) | ORAL | 0 refills | Status: DC | PRN

## 2020-10-04 NOTE — Progress Notes (Signed)
D/C instructions given to patient. Patient had no questions. NT or writer will wheel patient out once he is dressed  

## 2020-10-04 NOTE — Discharge Summary (Signed)
[facility] Discharge Summary   Patient ID: Adam Vaughn MRN: [id number] DOB/AGE: [birth year] 45 y.o.  Admit date: 09/30/2020 Discharge date: 10/04/2020  Admitting Diagnosis: Acute cholecystitis   Discharge Diagnosis Patient Active Problem List   Diagnosis Date Noted  . Acute cholecystitis 09/30/2020  . Insomnia 09/26/2020  . Knee pain 06/23/2020  . Low vitamin B12 level 05/31/2020  . Ankle sprain 05/31/2020  . Constipation by delayed colonic transit 05/31/2020  . Degenerative spondylolisthesis 04/11/2020  . 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 DDD (degenerative disc disease), lumbar 01/20/2020  . Left lumbar radiculitis 01/20/2020  . Vitamin D deficiency 01/15/2020  . Low back pain 01/12/2020  . IBS (irritable bowel syndrome) 01/12/2020  . Nephrolithiasis 01/12/2020  . Erectile dysfunction 01/12/2020  . GERD (gastroesophageal reflux disease) 01/12/2020    Consultants None  Imaging: MR 3D Recon At Scanner  Result Date: 10/04/2020 : CLINICAL DATA: Abdominal pain. Concern for biliary obstruction. Cholecystectomy 10/01/2020 is EXAM: MRI ABDOMEN WITHOUT AND WITH CONTRAST (INCLUDING MRCP) TECHNIQUE: Multiplanar multisequence MR imaging of the abdomen was performed both before and after the administration of intravenous contrast. Heavily T2-weighted images of the biliary and pancreatic ducts were obtained, and three-dimensional MRCP images were rendered by post processing. CONTRAST: 40mL GADAVIST GADOBUTROL 1 MMOL/ML IV SOLN COMPARISON: Ultrasound 09/30/2020 FINDINGS: Lower chest: Lung bases are clear. Hepatobiliary: Postcholecystectomy. Expected postsurgical change in the gallbladder fossa. No fluid collections. The common bile duct and common hepatic duct are normal. No filling defect within the common bile duct. No intrahepatic biliary duct dilatation. Several small lesions in the LEFT hepatic lobe which are hyperintense on T2  weighted imaging and have post-contrast enhancement pattern consistent with benign hemangioma. Pancreas: Normal pancreatic parenchymal intensity. No ductal dilatation or inflammation. Spleen: Normal spleen. Adrenals/urinary tract: Adrenal glands and kidneys are normal. Stomach/Bowel: Stomach and limited of the small bowel is unremarkable Vascular/Lymphatic: Abdominal aortic normal caliber. No retroperitoneal periportal lymphadenopathy. Musculoskeletal: Several lesions in the lower thoracic spine and lumbar spine which are high signal intensity on T2 weighted imaging (image 19/series 17 example and image 29/17). Lesion is hyperintense on fat saturated serrated precontrast T1 weighted imaging. These in demonstrate postcontrast enhancement and are most suggestive benign hemangiomas. IMPRESSION: 1. No complication following cholecystectomy. 2. No evidence of choledocholithiasis. No intrahepatic or extrahepatic biliary duct dilatation 3. Benign hemangiomas within the liver and spine. Electronically Signed By: Suzy Bouchard M.D. On: 10/02/2020 13:11 Electronically Signed   By: Suzy Bouchard M.D.   On: 10/04/2020 07:36   MR ABDOMEN MRCP W WO CONTAST  Result Date: 10/02/2020 CLINICAL DATA:  Abdominal pain. Concern for biliary obstruction. Cholecystectomy 10/01/2020 is EXAM: MRI ABDOMEN WITHOUT AND WITH CONTRAST (INCLUDING MRCP) TECHNIQUE: Multiplanar multisequence MR imaging of the abdomen was performed both before and after the administration of intravenous contrast. Heavily T2-weighted images of the biliary and pancreatic ducts were obtained, and three-dimensional MRCP images were rendered by post processing. CONTRAST:  60mL GADAVIST GADOBUTROL 1 MMOL/ML IV SOLN COMPARISON:  Ultrasound 09/30/2020 FINDINGS: Lower chest:  Lung bases are clear. Hepatobiliary: Postcholecystectomy. Expected postsurgical change in the gallbladder fossa. No fluid collections. The common bile duct and common hepatic duct are normal. No  filling defect within the common bile duct. No intrahepatic biliary duct dilatation. Several small lesions in the LEFT hepatic lobe which are hyperintense on T2 weighted imaging and have post-contrast enhancement pattern consistent with benign hemangioma. Pancreas: Normal pancreatic parenchymal intensity. No ductal dilatation or inflammation. Spleen: Normal spleen. Adrenals/urinary  tract: Adrenal glands and kidneys are normal. Stomach/Bowel: Stomach and limited of the small bowel is unremarkable Vascular/Lymphatic: Abdominal aortic normal caliber. No retroperitoneal periportal lymphadenopathy. Musculoskeletal: Several lesions in the lower thoracic spine and lumbar spine which are high signal intensity on T2 weighted imaging (image 19/series 17 example and image 29/17). Lesion is hyperintense on fat saturated serrated precontrast T1 weighted imaging. These in demonstrate postcontrast enhancement and are most suggestive benign hemangiomas. IMPRESSION: 1. No complication following cholecystectomy. 2. No evidence of choledocholithiasis. No intrahepatic or extrahepatic biliary duct dilatation 3. Benign hemangiomas within the liver and spine. Electronically Signed   By: Suzy Bouchard M.D.   On: 10/02/2020 13:11    Procedures Dr. Marlou Starks (10/01/2020) - Laparoscopic Cholecystectomy  Hospital Course:  Adam Vaughn is a 45yo male who presented to [facility] 4/1 with 2 days of RUQ abdominal pain and chest pain.  He was seen 2 days ago at the [facility] ED and worked up for cardiac issue which was negative.  He was sent home.  He had recurrent epigastric and right upper quadrant pain especially after a peanut butter sandwich last night.  His primary care sent him for an ultrasound showed gallstones he was sent to the emergency room. Patient was admitted to the surgical service and started on IV antibiotics. Patient was taken to the operating room and underwent procedure listed above. Unable to perform IOC during surgery. Tolerated  procedure well and was transferred to the floor.  LFTs noted to rise on POD#1 with bilirubin of 2.6. MRCP performed and negative for choledocholithiasis. Bilirubin was monitored and trended down. Diet was advanced as tolerated.  On POD#3, the patient was voiding well, tolerating diet, ambulating well, pain well controlled, vital signs stable, incisions c/d/i and felt stable for discharge home.  Patient will follow up as below and knows to call with questions or concerns.      Physical Exam: General:  Alert, NAD, pleasant, comfortable Pulm: rate and effort normal Abd:  Soft, ND, appropriately tender, multiple lap incisions C/D/I, +BS  Allergies as of 10/04/2020      Reactions   Gabapentin    Twitching muscles      Medication List    TAKE these medications   acetaminophen 500 MG tablet Commonly known as: TYLENOL Take 1,000 mg by mouth every 6 (six) hours as needed for moderate pain.   baclofen 10 MG tablet Commonly known as: LIORESAL Take 10 mg by mouth daily as needed for muscle spasms.   DULoxetine 30 MG capsule Commonly known as: CYMBALTA Take 30 mg by mouth daily.   esomeprazole 40 MG capsule Commonly known as: NexIUM Take 1 capsule (40 mg total) by mouth daily. What changed: when to take this   fluticasone 50 MCG/ACT nasal spray Commonly known as: FLONASE Place 2 sprays into both nostrils daily.   naproxen 500 MG tablet Commonly known as: NAPROSYN Take 1 tablet (500 mg total) by mouth 2 (two) times daily as needed for moderate pain.   ondansetron 4 MG disintegrating tablet Commonly known as: ZOFRAN-ODT Take 1 tablet (4 mg total) by mouth every 6 (six) hours as needed for nausea or vomiting.   Oxycodone HCl 10 MG Tabs Take 1 tablet (10 mg total) by mouth every 3 (three) hours as needed for severe pain ((score 7 to 10)).   oxymetazoline 0.05 % nasal spray Commonly known as: AFRIN Place 1 spray into both nostrils at bedtime.   pregabalin 150 MG capsule Commonly  known as: Lyrica Take  1 capsule (150 mg total) by mouth 2 (two) times daily.   Tadalafil 2.5 MG Tabs Take 2.5 mg by mouth daily as needed (erectyl dysfunction).   Vitamin B-12 1000 MCG Subl Place 1 tablet (1,000 mcg total) under the tongue daily.   Vitamin D3 50 MCG (2000 UT) capsule Take 1 capsule (2,000 Units total) by mouth daily.         Follow-up [facility], [place]. Go on 10/25/2020.   Specialty: General Surgery Why: Your appointment is 04/26 at 8:45 am Please arrive 30 minutes prior to your appointment to check in and fill out paperwork. Bring photo ID and insurance information. Contact information: [address] [place] [phone]              Signed: Wellington Hampshire, [facility] 10/04/2020, 8:21 AM Please see Amion for pager number during day hours 7:00am-4:30pm

## 2020-10-06 ENCOUNTER — Inpatient Hospital Stay: Payer: 59 | Admitting: Internal Medicine

## 2020-10-07 ENCOUNTER — Other Ambulatory Visit: Payer: Self-pay

## 2020-10-10 ENCOUNTER — Ambulatory Visit (INDEPENDENT_AMBULATORY_CARE_PROVIDER_SITE_OTHER): Payer: 59

## 2020-10-10 ENCOUNTER — Encounter: Payer: Self-pay | Admitting: Internal Medicine

## 2020-10-10 ENCOUNTER — Telehealth: Payer: Self-pay | Admitting: Family Medicine

## 2020-10-10 ENCOUNTER — Ambulatory Visit (INDEPENDENT_AMBULATORY_CARE_PROVIDER_SITE_OTHER): Payer: 59 | Admitting: Internal Medicine

## 2020-10-10 ENCOUNTER — Other Ambulatory Visit: Payer: Self-pay

## 2020-10-10 DIAGNOSIS — R739 Hyperglycemia, unspecified: Secondary | ICD-10-CM | POA: Diagnosis not present

## 2020-10-10 DIAGNOSIS — R0789 Other chest pain: Secondary | ICD-10-CM

## 2020-10-10 DIAGNOSIS — E538 Deficiency of other specified B group vitamins: Secondary | ICD-10-CM

## 2020-10-10 DIAGNOSIS — Z9049 Acquired absence of other specified parts of digestive tract: Secondary | ICD-10-CM

## 2020-10-10 LAB — CBC WITH DIFFERENTIAL/PLATELET
Basophils Absolute: 0 10*3/uL (ref 0.0–0.1)
Basophils Relative: 0.5 % (ref 0.0–3.0)
Eosinophils Absolute: 0 10*3/uL (ref 0.0–0.7)
Eosinophils Relative: 0.7 % (ref 0.0–5.0)
HCT: 44.1 % (ref 39.0–52.0)
Hemoglobin: 15 g/dL (ref 13.0–17.0)
Lymphocytes Relative: 25.9 % (ref 12.0–46.0)
Lymphs Abs: 1.6 10*3/uL (ref 0.7–4.0)
MCHC: 34 g/dL (ref 30.0–36.0)
MCV: 86 fl (ref 78.0–100.0)
Monocytes Absolute: 0.5 10*3/uL (ref 0.1–1.0)
Monocytes Relative: 8.6 % (ref 3.0–12.0)
Neutro Abs: 4.1 10*3/uL (ref 1.4–7.7)
Neutrophils Relative %: 64.3 % (ref 43.0–77.0)
Platelets: 324 10*3/uL (ref 150.0–400.0)
RBC: 5.13 Mil/uL (ref 4.22–5.81)
RDW: 12.8 % (ref 11.5–15.5)
WBC: 6.3 10*3/uL (ref 4.0–10.5)

## 2020-10-10 LAB — COMPREHENSIVE METABOLIC PANEL
ALT: 124 U/L — ABNORMAL HIGH (ref 0–53)
AST: 32 U/L (ref 0–37)
Albumin: 4.5 g/dL (ref 3.5–5.2)
Alkaline Phosphatase: 108 U/L (ref 39–117)
BUN: 14 mg/dL (ref 6–23)
CO2: 29 mEq/L (ref 19–32)
Calcium: 9.7 mg/dL (ref 8.4–10.5)
Chloride: 102 mEq/L (ref 96–112)
Creatinine, Ser: 0.85 mg/dL (ref 0.40–1.50)
GFR: 105.23 mL/min (ref >60.00)
Glucose, Bld: 102 mg/dL — ABNORMAL HIGH (ref 70–99)
Potassium: 4.1 mEq/L (ref 3.5–5.1)
Sodium: 138 mEq/L (ref 135–145)
Total Bilirubin: 0.9 mg/dL (ref 0.2–1.2)
Total Protein: 7.5 g/dL (ref 6.0–8.3)

## 2020-10-10 LAB — LIPASE: Lipase: 44 U/L (ref 11.0–59.0)

## 2020-10-10 LAB — HEMOGLOBIN A1C: Hgb A1c MFr Bld: 5.6 % (ref 4.6–6.5)

## 2020-10-10 LAB — CK: Total CK: 36 U/L (ref 7–232)

## 2020-10-10 MED ORDER — ESOMEPRAZOLE MAGNESIUM 40 MG PO CPDR: 40.0000 mg | DELAYED_RELEASE_CAPSULE | Freq: Two times a day (BID) | ORAL | 3 refills | Status: DC

## 2020-10-10 NOTE — Assessment & Plan Note (Signed)
R lower CP 8/10 w/movement and deep breaths ?etiology post-cholecystectomy 10/01/20 CXR LFTs, D-dimer, CBC

## 2020-10-10 NOTE — Assessment & Plan Note (Signed)
Check A1c. 

## 2020-10-10 NOTE — Patient Instructions (Signed)
Milk thistle

## 2020-10-10 NOTE — Telephone Encounter (Signed)
Pt would like fitting for a knee brace.

## 2020-10-10 NOTE — Assessment & Plan Note (Signed)
Re-start B12 

## 2020-10-10 NOTE — Telephone Encounter (Signed)
Patient asked if we would be able to place an order for him to be fitted for knee braces. He was not able to be approved for gel injections but spoke to his insurance who said that they help on the cost of braces. Can we have Ryan contact the patient to set up a time?  Please advise.

## 2020-10-10 NOTE — Progress Notes (Signed)
Subjective:  Patient ID: Adam Vaughn, male    DOB: 07-19-1975  Age: 45 y.o. MRN: [id number]  CC: Follow-up (HOSP F/U)   HPI Adam Vaughn presents for post-hosp f/u - cholecystectomy, elevated LFTs post-op. Post-op MRI did not show obstruction... C/o R lower CP 8/10 w/movement and deep breaths   Per hx:  "Admit date: 09/30/2020 Discharge date: 10/04/2020  Admitting Diagnosis: Acute cholecystitis   Discharge Diagnosis     Patient Active Problem List   Diagnosis Date Noted  . Acute cholecystitis 09/30/2020  . Insomnia 09/26/2020  . Knee pain 06/23/2020  . Low vitamin B12 level 05/31/2020  . Ankle sprain 05/31/2020  . Constipation by delayed colonic transit 05/31/2020  . Degenerative spondylolisthesis 04/11/2020  . 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 DDD (degenerative disc disease), lumbar 01/20/2020  . Left lumbar radiculitis 01/20/2020  . Vitamin D deficiency 01/15/2020  . Low back pain 01/12/2020  . IBS (irritable bowel syndrome) 01/12/2020  . Nephrolithiasis 01/12/2020  . Erectile dysfunction 01/12/2020  . GERD (gastroesophageal reflux disease) 01/12/2020    Consultants None  Imaging:  Imaging Results (Last 48 hours)  MR 3D Recon At Scanner  Result Date: 10/04/2020 : CLINICAL DATA: Abdominal pain. Concern for biliary obstruction. Cholecystectomy 10/01/2020 is EXAM: MRI ABDOMEN WITHOUT AND WITH CONTRAST (INCLUDING MRCP) TECHNIQUE: Multiplanar multisequence MR imaging of the abdomen was performed both before and after the administration of intravenous contrast. Heavily T2-weighted images of the biliary and pancreatic ducts were obtained, and three-dimensional MRCP images were rendered by post processing. CONTRAST: 76mL GADAVIST GADOBUTROL 1 MMOL/ML IV SOLN COMPARISON: Ultrasound 09/30/2020 FINDINGS: Lower chest: Lung bases are clear. Hepatobiliary: Postcholecystectomy. Expected postsurgical change in the gallbladder  fossa. No fluid collections. The common bile duct and common hepatic duct are normal. No filling defect within the common bile duct. No intrahepatic biliary duct dilatation. Several small lesions in the LEFT hepatic lobe which are hyperintense on T2 weighted imaging and have post-contrast enhancement pattern consistent with benign hemangioma. Pancreas: Normal pancreatic parenchymal intensity. No ductal dilatation or inflammation. Spleen: Normal spleen. Adrenals/urinary tract: Adrenal glands and kidneys are normal. Stomach/Bowel: Stomach and limited of the small bowel is unremarkable Vascular/Lymphatic: Abdominal aortic normal caliber. No retroperitoneal periportal lymphadenopathy. Musculoskeletal: Several lesions in the lower thoracic spine and lumbar spine which are high signal intensity on T2 weighted imaging (image 19/series 17 example and image 29/17). Lesion is hyperintense on fat saturated serrated precontrast T1 weighted imaging. These in demonstrate postcontrast enhancement and are most suggestive benign hemangiomas. IMPRESSION: 1. No complication following cholecystectomy. 2. No evidence of choledocholithiasis. No intrahepatic or extrahepatic biliary duct dilatation 3. Benign hemangiomas within the liver and spine. Electronically Signed By: Suzy Bouchard M.D. On: 10/02/2020 13:11 Electronically Signed   By: Suzy Bouchard M.D.   On: 10/04/2020 07:36   MR ABDOMEN MRCP W WO CONTAST  Result Date: 10/02/2020 CLINICAL DATA:  Abdominal pain. Concern for biliary obstruction. Cholecystectomy 10/01/2020 is EXAM: MRI ABDOMEN WITHOUT AND WITH CONTRAST (INCLUDING MRCP) TECHNIQUE: Multiplanar multisequence MR imaging of the abdomen was performed both before and after the administration of intravenous contrast. Heavily T2-weighted images of the biliary and pancreatic ducts were obtained, and three-dimensional MRCP images were rendered by post processing. CONTRAST:  80mL GADAVIST GADOBUTROL 1 MMOL/ML IV SOLN  COMPARISON:  Ultrasound 09/30/2020 FINDINGS: Lower chest:  Lung bases are clear. Hepatobiliary: Postcholecystectomy. Expected postsurgical change in the gallbladder fossa. No fluid collections. The common bile duct and common hepatic duct are normal.  No filling defect within the common bile duct. No intrahepatic biliary duct dilatation. Several small lesions in the LEFT hepatic lobe which are hyperintense on T2 weighted imaging and have post-contrast enhancement pattern consistent with benign hemangioma. Pancreas: Normal pancreatic parenchymal intensity. No ductal dilatation or inflammation. Spleen: Normal spleen. Adrenals/urinary tract: Adrenal glands and kidneys are normal. Stomach/Bowel: Stomach and limited of the small bowel is unremarkable Vascular/Lymphatic: Abdominal aortic normal caliber. No retroperitoneal periportal lymphadenopathy. Musculoskeletal: Several lesions in the lower thoracic spine and lumbar spine which are high signal intensity on T2 weighted imaging (image 19/series 17 example and image 29/17). Lesion is hyperintense on fat saturated serrated precontrast T1 weighted imaging. These in demonstrate postcontrast enhancement and are most suggestive benign hemangiomas. IMPRESSION: 1. No complication following cholecystectomy. 2. No evidence of choledocholithiasis. No intrahepatic or extrahepatic biliary duct dilatation 3. Benign hemangiomas within the liver and spine. Electronically Signed   By: Suzy Bouchard M.D.   On: 10/02/2020 13:11     Procedures Dr. Marlou Starks (10/01/2020) - Laparoscopic Cholecystectomy  Hospital Course:  Adam Vaughn is a 45yo male who presented to [facility] 4/1 with 2 days of RUQ abdominal pain and chest pain.  He was seen 2 days ago at the [facility] ED and worked up for cardiac issue which was negative. He was sent home. He had recurrent epigastric and right upper quadrant pain especially after a peanut butter sandwich last night. His primary care sent him for an  ultrasound showed gallstones he was sent to the emergency room. Patient was admitted to the surgical service and started on IV antibiotics. Patient was taken to the operating room and underwent procedure listed above. Unable to perform IOC during surgery. Tolerated procedure well and was transferred to the floor.  LFTs noted to rise on POD#1 with bilirubin of 2.6. MRCP performed and negative for choledocholithiasis. Bilirubin was monitored and trended down. Diet was advanced as tolerated.  On POD#3, the patient was voiding well, tolerating diet, ambulating well, pain well controlled, vital signs stable, incisions c/d/i and felt stable for discharge home.  Patient will follow up as below and knows to call with questions or concerns."     Outpatient Medications Prior to Visit  Medication Sig Dispense Refill  . acetaminophen (TYLENOL) 500 MG tablet Take 1,000 mg by mouth every 6 (six) hours as needed for moderate pain.    . baclofen (LIORESAL) 10 MG tablet Take 10 mg by mouth daily as needed for muscle spasms.    . Cholecalciferol (VITAMIN D3) 50 MCG (2000 UT) capsule Take 1 capsule (2,000 Units total) by mouth daily. 100 capsule 3  . Cyanocobalamin (VITAMIN B-12) 1000 MCG SUBL Place 1 tablet (1,000 mcg total) under the tongue daily. 100 tablet 3  . DULoxetine (CYMBALTA) 30 MG capsule Take 30 mg by mouth daily.    [name] esomeprazole (NEXIUM) 40 MG capsule Take 1 capsule (40 mg total) by mouth daily. (Patient taking differently: Take 40 mg by mouth 2 (two) times daily before a meal.) 90 capsule 3  . fluticasone (FLONASE) 50 MCG/ACT nasal spray Place 2 sprays into both nostrils daily. 16 g 6  . naproxen (NAPROSYN) 500 MG tablet Take 1 tablet (500 mg total) by mouth 2 (two) times daily as needed for moderate pain. 60 tablet 3  . oxymetazoline (AFRIN) 0.05 % nasal spray Place 1 spray into both nostrils at bedtime.    . Tadalafil 2.5 MG TABS Take 2.5 mg by mouth daily as needed (erectyl dysfunction).    [name]  ondansetron (ZOFRAN-ODT) 4 MG disintegrating tablet Take 1 tablet (4 mg total) by mouth every 6 (six) hours as needed for nausea or vomiting. (Patient not taking: Reported on 10/10/2020) 15 tablet 0  . oxyCODONE 10 MG TABS Take 1 tablet (10 mg total) by mouth every 3 (three) hours as needed for severe pain ((score 7 to 10)). (Patient not taking: Reported on 10/10/2020) 40 tablet 0  . pregabalin (LYRICA) 150 MG capsule Take 1 capsule (150 mg total) by mouth 2 (two) times daily. (Patient not taking: Reported on 10/10/2020) 60 capsule 2   No facility-administered medications prior to visit.    ROS: Review of Systems  Objective:  BP 130/84 (BP Location: Left Arm)   Pulse 90   Temp 98.5 F (36.9 C) (Oral)   Ht 5\' 8"  (1.727 m)   Wt 193 lb 9.6 oz (87.8 kg)   SpO2 98%   BMI 29.44 kg/m   BP Readings from Last 3 Encounters:  10/10/20 130/84  10/04/20 122/72  09/30/20 (!) 144/90    Wt Readings from Last 3 Encounters:  10/10/20 193 lb 9.6 oz (87.8 kg)  10/03/20 203 lb 14.8 oz (92.5 kg)  09/30/20 204 lb (92.5 kg)    Physical Exam  Lab Results  Component Value Date   WBC 4.2 10/04/2020   HGB 13.7 10/04/2020   HCT 42.7 10/04/2020   PLT 93 (L) 10/04/2020   GLUCOSE 89 10/04/2020   ALT 324 (H) 10/04/2020   AST 88 (H) 10/04/2020   NA 138 10/04/2020   K 4.6 10/04/2020   CL 107 10/04/2020   CREATININE 0.94 10/04/2020   BUN 10 10/04/2020   CO2 22 10/04/2020   TSH 1.26 01/14/2020    DG Chest 2 View  Result Date: 09/30/2020 CLINICAL DATA:  Chest pain with right upper quadrant abdominal pain, nausea and vomiting for 3 days. EXAM: CHEST - 2 VIEW COMPARISON:  Radiographs 09/27/2020. FINDINGS: The heart size and mediastinal contours are normal. The lungs are clear. There is no pleural effusion or pneumothorax. No acute osseous findings are identified. IMPRESSION: Stable chest.  No active cardiopulmonary process. Electronically Signed   By: Richardean Sale M.D.   On: 09/30/2020 18:03   US  Abdomen Limited RUQ (LIVER/GB)  Result Date: 09/30/2020 CLINICAL DATA:  Right upper quadrant pain for 1 month. EXAM: ULTRASOUND ABDOMEN LIMITED RIGHT UPPER QUADRANT COMPARISON:  CT stone study 07/10/2017. CT abdomen and pelvis without and with contrast 10/25/2009 FINDINGS: Gallbladder: Gallbladder is diffusely filled with sludge. Shadowing stones measuring up to 6 mm are present. Wall thickness is within normal limits at 2 mm. No sonographic Percell Miller sign is reported. Common bile duct: Diameter: 3 mm, within normal limits. Liver: Focal hyperechoic lesion in the left lobe the liver measures up to 10 mm. No other focal lesions are present. Parenchyma is mildly echogenic. Portal vein is patent on color Doppler imaging with normal direction of blood flow towards the liver. Other: None. IMPRESSION: 1. Cholelithiasis without evidence for cholecystitis. 2. Diffuse gallbladder sludge. 3. 10 mm hyperechoic lesion in the left lobe of the liver likely represents a small hemangioma. Two increased diffuse parenchymal echogenicity of the liver suggesting hepatic steatosis. Electronically Signed   By: San Morelle M.D.   On: 09/30/2020 19:07    Assessment & Plan:    Walker Kehr, MD

## 2020-10-10 NOTE — Assessment & Plan Note (Signed)
R lower CP 8/10 w/movement and deep breaths ?etiology post-cholecystectomy CXR LFTs, D-dimer, CBC

## 2020-10-11 ENCOUNTER — Ambulatory Visit
Admission: RE | Admit: 2020-10-11 | Discharge: 2020-10-11 | Disposition: A | Discharge status: Home / Self Care | Payer: 59 | Source: Ambulatory Visit | Attending: Internal Medicine | Admitting: Internal Medicine

## 2020-10-11 ENCOUNTER — Other Ambulatory Visit: Payer: Self-pay | Admitting: Internal Medicine

## 2020-10-11 DIAGNOSIS — R071 Chest pain on breathing: Secondary | ICD-10-CM

## 2020-10-11 LAB — D-DIMER, QUANTITATIVE: D-Dimer, Quant: 0.85 mcg/mL FEU — ABNORMAL HIGH (ref <0.50)

## 2020-10-11 MED ORDER — IOPAMIDOL (ISOVUE-370) INJECTION 76%
75.0000 mL | Freq: Once | INTRAVENOUS | Status: AC | PRN
  Administered 2020-10-11: 75 mL via INTRAVENOUS

## 2020-10-11 NOTE — Telephone Encounter (Signed)
I am communicating with the representative for [employer] who makes the braces to get you fitted for medial off loader knee braces which may be helpful.  You should hear from her soon.

## 2020-10-24 ENCOUNTER — Other Ambulatory Visit: Payer: Self-pay

## 2020-10-25 ENCOUNTER — Ambulatory Visit (INDEPENDENT_AMBULATORY_CARE_PROVIDER_SITE_OTHER): Payer: 59 | Admitting: Internal Medicine

## 2020-10-25 ENCOUNTER — Encounter: Payer: Self-pay | Admitting: Internal Medicine

## 2020-10-25 ENCOUNTER — Other Ambulatory Visit: Payer: Self-pay

## 2020-10-25 DIAGNOSIS — I809 Phlebitis and thrombophlebitis of unspecified site: Secondary | ICD-10-CM | POA: Diagnosis not present

## 2020-10-25 DIAGNOSIS — J301 Allergic rhinitis due to pollen: Secondary | ICD-10-CM | POA: Diagnosis not present

## 2020-10-25 DIAGNOSIS — M545 Low back pain, unspecified: Secondary | ICD-10-CM

## 2020-10-25 DIAGNOSIS — G8929 Other chronic pain: Secondary | ICD-10-CM

## 2020-10-25 DIAGNOSIS — E538 Deficiency of other specified B group vitamins: Secondary | ICD-10-CM | POA: Diagnosis not present

## 2020-10-25 DIAGNOSIS — Z820 Family history of epilepsy and other diseases of the nervous system: Secondary | ICD-10-CM

## 2020-10-25 DIAGNOSIS — R7989 Other specified abnormal findings of blood chemistry: Secondary | ICD-10-CM

## 2020-10-25 DIAGNOSIS — K580 Irritable bowel syndrome with diarrhea: Secondary | ICD-10-CM

## 2020-10-25 DIAGNOSIS — J309 Allergic rhinitis, unspecified: Secondary | ICD-10-CM | POA: Insufficient documentation

## 2020-10-25 DIAGNOSIS — Z831 Family history of other infectious and parasitic diseases: Secondary | ICD-10-CM

## 2020-10-25 MED ORDER — ONDANSETRON 4 MG PO TBDP: 4.0000 mg | ORAL_TABLET | Freq: Four times a day (QID) | ORAL | 0 refills | Status: DC | PRN

## 2020-10-25 NOTE — Assessment & Plan Note (Signed)
On Oxy, Lyrica per Dr [name]

## 2020-10-25 NOTE — Assessment & Plan Note (Signed)
Claritin Irrigation

## 2020-10-25 NOTE — Assessment & Plan Note (Signed)
R wrist post- venipuncture Start ASA 325 mg bid

## 2020-10-25 NOTE — Assessment & Plan Note (Signed)
On B12 

## 2020-10-25 NOTE — Assessment & Plan Note (Signed)
Mother, sister GI ref

## 2020-10-25 NOTE — Progress Notes (Signed)
Subjective:  Patient ID: Adam Vaughn, male    DOB: [birth year]  Age: 45 y.o. MRN: [id number]  CC: Follow-up (2 week f/u)   HPI Adam Vaughn presents for elevated LFTs, LBP F/u constipation - using LOC C/o bad allergies  Outpatient Medications Prior to Visit  Medication Sig Dispense Refill  . Cholecalciferol (VITAMIN D3) 50 MCG (2000 UT) capsule Take 1 capsule (2,000 Units total) by mouth daily. 100 capsule 3  . Cyanocobalamin (VITAMIN B-12) 1000 MCG SUBL Place 1 tablet (1,000 mcg total) under the tongue daily. 100 tablet 3  . esomeprazole (NEXIUM) 40 MG capsule Take 1 capsule (40 mg total) by mouth 2 (two) times daily before a meal. 180 capsule 3  . fluticasone (FLONASE) 50 MCG/ACT nasal spray Place 2 sprays into both nostrils daily. 16 g 6  . naproxen (NAPROSYN) 500 MG tablet Take 1 tablet (500 mg total) by mouth 2 (two) times daily as needed for moderate pain. 60 tablet 3  . ondansetron (ZOFRAN-ODT) 4 MG disintegrating tablet Take 1 tablet (4 mg total) by mouth every 6 (six) hours as needed for nausea or vomiting. 15 tablet 0  . oxyCODONE 10 MG TABS Take 1 tablet (10 mg total) by mouth every 3 (three) hours as needed for severe pain ((score 7 to 10)). 40 tablet 0  . oxymetazoline (AFRIN) 0.05 % nasal spray Place 1 spray into both nostrils at bedtime.    [name] acetaminophen (TYLENOL) 500 MG tablet Take 1,000 mg by mouth every 6 (six) hours as needed for moderate pain. (Patient not taking: Reported on 10/25/2020)    . baclofen (LIORESAL) 10 MG tablet Take 10 mg by mouth daily as needed for muscle spasms. (Patient not taking: Reported on 10/25/2020)    . DULoxetine (CYMBALTA) 30 MG capsule Take 30 mg by mouth daily. (Patient not taking: Reported on 10/25/2020)    . pregabalin (LYRICA) 150 MG capsule Take 1 capsule (150 mg total) by mouth 2 (two) times daily. (Patient not taking: No sig reported) 60 capsule 2  . Tadalafil 2.5 MG TABS Take 2.5 mg by mouth daily as needed (erectyl  dysfunction). (Patient not taking: Reported on 10/25/2020)     No facility-administered medications prior to visit.    ROS: Review of Systems  Constitutional: Positive for fatigue. Negative for appetite change and unexpected weight change.  HENT: Negative for congestion, nosebleeds, sneezing, sore throat and trouble swallowing.   Eyes: Negative for itching and visual disturbance.  Respiratory: Negative for cough.   Cardiovascular: Negative for chest pain, palpitations and leg swelling.  Gastrointestinal: Negative for abdominal distention, blood in stool, diarrhea and nausea.  Genitourinary: Negative for frequency and hematuria.  Musculoskeletal: Positive for back pain. Negative for gait problem, joint swelling and neck pain.  Skin: Negative for rash.  Neurological: Positive for weakness. Negative for dizziness, tremors and speech difficulty.  Psychiatric/Behavioral: Negative for agitation, dysphoric mood and sleep disturbance. The patient is not nervous/anxious.     Objective:  BP 124/84 (BP Location: Left Arm)   Pulse 86   Temp 98.3 F (36.8 C) (Oral)   Ht 5\' 8"  (1.727 m)   Wt 195 lb 3.2 oz (88.5 kg)   SpO2 97%   BMI 29.68 kg/m   BP Readings from Last 3 Encounters:  10/25/20 124/84  10/10/20 130/84  10/04/20 122/72    Wt Readings from Last 3 Encounters:  10/25/20 195 lb 3.2 oz (88.5 kg)  10/10/20 193 lb 9.6 oz (87.8 kg)  10/03/20 203 lb 14.8  oz (92.5 kg)    Physical Exam Constitutional:      General: He is not in acute distress.    Appearance: He is well-developed.     Comments: NAD  Eyes:     Conjunctiva/sclera: Conjunctivae normal.     Pupils: Pupils are equal, round, and reactive to light.  Neck:     Thyroid: No thyromegaly.     Vascular: No JVD.  Cardiovascular:     Rate and Rhythm: Normal rate and regular rhythm.     Heart sounds: Normal heart sounds. No murmur heard. No friction rub. No gallop.   Pulmonary:     Effort: Pulmonary effort is normal. No  respiratory distress.     Breath sounds: Normal breath sounds. No wheezing or rales.  Chest:     Chest wall: No tenderness.  Abdominal:     General: Bowel sounds are normal. There is no distension.     Palpations: Abdomen is soft. There is no mass.     Tenderness: There is no abdominal tenderness. There is no guarding or rebound.  Musculoskeletal:        General: Tenderness present. Normal range of motion.     Cervical back: Normal range of motion.  Lymphadenopathy:     Cervical: No cervical adenopathy.  Skin:    General: Skin is warm and dry.     Findings: No rash.  Neurological:     Mental Status: He is alert and oriented to person, place, and time.     Cranial Nerves: No cranial nerve deficit.     Motor: No abnormal muscle tone.     Coordination: Coordination normal.     Gait: Gait normal.     Deep Tendon Reflexes: Reflexes are normal and symmetric.  Psychiatric:        Behavior: Behavior normal.        Thought Content: Thought content normal.        Judgment: Judgment normal.    Pt looks better LS is tender R dorsal wrist w/short thrombosed vein  Lab Results  Component Value Date   WBC 6.3 10/10/2020   HGB 15.0 10/10/2020   HCT 44.1 10/10/2020   PLT 324.0 10/10/2020   GLUCOSE 102 (H) 10/10/2020   ALT 124 (H) 10/10/2020   AST 32 10/10/2020   NA 138 10/10/2020   K 4.1 10/10/2020   CL 102 10/10/2020   CREATININE 0.85 10/10/2020   BUN 14 10/10/2020   CO2 29 10/10/2020   TSH 1.26 01/14/2020   HGBA1C 5.6 10/10/2020    CT Angio Chest W/Cm &/Or Wo Cm  Result Date: 10/11/2020 CLINICAL DATA:  Positive D-dimer, right-sided pleuritic chest pain status post recent cholecystectomy EXAM: CT ANGIOGRAPHY CHEST WITH CONTRAST TECHNIQUE: Multidetector CT imaging of the chest was performed using the standard protocol during bolus administration of intravenous contrast. Multiplanar CT image reconstructions and MIPs were obtained to evaluate the vascular anatomy. CONTRAST:  47mL  ISOVUE-370 IOPAMIDOL (ISOVUE-370) INJECTION 76% COMPARISON:  None. FINDINGS: Cardiovascular: Satisfactory opacification of the pulmonary arteries to the segmental level. No evidence of pulmonary embolism. Normal heart size. No pericardial effusion. Four vessel aortic arch. The left vertebral artery arises directly from the aorta. Mediastinum/Nodes: Unremarkable CT appearance of the thyroid gland. No suspicious mediastinal or hilar adenopathy. No soft tissue mediastinal mass. The thoracic esophagus is unremarkable. Lungs/Pleura: Lungs are clear. No pleural effusion or pneumothorax. Upper Abdomen: No acute abnormality. Surgical clips in the right upper quadrant consistent with prior cholecystectomy. No abnormality visualized  within the gallbladder fossa. Musculoskeletal: No chest wall abnormality. No acute or significant osseous findings. Review of the MIP images confirms the above findings. IMPRESSION: Negative for acute pulmonary embolus, pneumonia or other acute cardiopulmonary process. Electronically Signed   By: Jacqulynn Cadet M.D.   On: 10/11/2020 11:32    Assessment & Plan:    Walker Kehr, MD

## 2020-10-25 NOTE — Patient Instructions (Signed)
Start ASA 325 mg twice a day x 1 week

## 2020-10-28 ENCOUNTER — Telehealth: Payer: Self-pay | Admitting: Family Medicine

## 2020-10-28 NOTE — Telephone Encounter (Signed)
We are having a hard time getting the braces approved as well.  I think the main issue is that the x-ray of your knees from December was read as not much arthritis.  I disagree with this read.  I think if we could see you in clinic again and demonstrate especially on an x-ray that you have arthritis I would be able to get things like the gel shots and the braces approved.  My advice is to schedule another visit so that we can work on getting the the information to [employer] company that you need these treatments.

## 2020-11-01 ENCOUNTER — Encounter: Payer: Self-pay | Admitting: Physician Assistant

## 2020-11-02 NOTE — Telephone Encounter (Signed)
Called pt and relayed Dr. Clovis Riley message.  He is scheduled for tomorrow at 10:45 am.

## 2020-11-03 ENCOUNTER — Other Ambulatory Visit: Payer: Self-pay

## 2020-11-03 ENCOUNTER — Ambulatory Visit (INDEPENDENT_AMBULATORY_CARE_PROVIDER_SITE_OTHER): Payer: 59 | Admitting: Family Medicine

## 2020-11-03 ENCOUNTER — Ambulatory Visit (INDEPENDENT_AMBULATORY_CARE_PROVIDER_SITE_OTHER): Payer: 59

## 2020-11-03 VITALS — BP 145/97 | HR 94 | Ht 68.0 in | Wt 197.6 lb

## 2020-11-03 DIAGNOSIS — M25562 Pain in left knee: Secondary | ICD-10-CM

## 2020-11-03 DIAGNOSIS — G8929 Other chronic pain: Secondary | ICD-10-CM

## 2020-11-03 DIAGNOSIS — M25561 Pain in right knee: Secondary | ICD-10-CM | POA: Diagnosis not present

## 2020-11-03 NOTE — Patient Instructions (Signed)
Thank you for coming in today.  Please get an Xray today before you leave  A new xray may allow [place] to get the brace.   Keep me updated.

## 2020-11-03 NOTE — Progress Notes (Signed)
I, [name], LAT, ATC, am serving as scribe for Dr. [name].  Adam Vaughn is a 45 y.o. male who presents to [place] at [facility] today for f/u of B knee pain, R>L.  He was last seen by Dr. Georgina Snell on 07/29/20 for f/u, noting 60% improvement in his symptoms following B knee injections on 06/28/20 that lasted for approximately 3 weeks.  He was referred to PT and completed 1 visit.  Knee gel injections were ordered but not authorized by his insurance.  An order was later placed for specialized knee braces through [facility] but these were also not authorized.  Since his last visit, pt reports that knee pain is about the same. Pt notes increased pain w/ squatting and going up/down steps. Pt c/o increased pain at night and the pain is disturbing his sleep. Pt c/o of "aching" pain at night and  3"sharp" pain when squatting. Pt was seen at [facility] in [facility].  He was offered hyaluronic acid injections, PRP injections and stem cell injections all not covered by insurance and all very expensive.  He was also offered some over-the-counter knee braces that helped a lot but were still pretty expensive.  The total cost of the over-the-counter knee braces that were helpful would be less than the total cost of the custom knee braces provided by [employer] if knee braces were covered by insurance.  Diagnostic imaging: R and L knee XR- 06/28/20  Pertinent review of systems: No fevers or chills  Relevant historical information: Spinal stenosis   Exam:  BP (!) 145/97 (BP Location: Right Arm, Patient Position: Sitting, Cuff Size: Normal)   Pulse 94   Ht 5\' 8"  (1.727 m)   Wt 197 lb 9.6 oz (89.6 kg)   SpO2 98%   BMI 30.04 kg/m  General: Well Developed, well nourished, and in no acute distress.   MSK: Bilateral knees minimal effusions normal motion with crepitation.  Tender palpation medial joint line. Slight laxity to valgus stress test. Intact strength.    Lab and  Radiology Results  X-ray images bilateral AP knee obtained today personally and independently interpreted  shows mild medial compartment DJD Await formal radiology review    Assessment and Plan: 45 y.o. 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  Patient had trial of steroid injections in the past that only lasted a few weeks.  He ideally would be a good candidate for trial of hyaluronic acid injection however [employer] his health insurance provider will not cover these treatments.  He is a great candidate for medial off loader knee braces as he is already had some benefit with an over-the-counter brace.  I believe with new x-rays today that do show DJD he should be able to get knee braces authorized.  Await radiology review and then will retry authorization for the custom OA off loader knee braces provided by [employer]. Additionally discussed other potential treatment options.  Discussed PRP and even stem cell therapy.  Could certainly try PRP in the future but recommend against stem cell injections in general.  PDMP not reviewed this encounter. Orders Placed This Encounter  Procedures  . DG Knee AP/LAT W/Sunrise Left    Standing Status:   Future    Standing Expiration Date:   11/03/2021    Order Specific Question:   Reason for Exam (SYMPTOM  OR DIAGNOSIS REQUIRED)    Answer:   chronic bilateral knee pain    Order Specific  Question:   Preferred imaging location?    Answer:   [facility]  . DG Knee AP/LAT W/Sunrise Right    Standing Status:   Future    Standing Expiration Date:   11/03/2021    Order Specific Question:   Reason for Exam (SYMPTOM  OR DIAGNOSIS REQUIRED)    Answer:   chronic bilateral knee pain    Order Specific Question:   Preferred imaging location?    Answer:   [facility]  . DG Knee Bilateral Standing AP    Standing Status:   Future    Number of Occurrences:   1    Standing Expiration Date:   11/03/2021    Order Specific  Question:   Reason for Exam (SYMPTOM  OR DIAGNOSIS REQUIRED)    Answer:   eval djd knee prior to brace    Order Specific Question:   Preferred imaging location?    Answer:   [facility]   No orders of the defined types were placed in this encounter.    Discussed warning signs or symptoms. Please see discharge instructions. Patient expresses understanding.   The above documentation has been reviewed and is accurate and complete [name], M.D.  Total encounter time 20 minutes including face-to-face time with the patient and, reviewing past medical record, and charting on the date of service.

## 2020-11-07 NOTE — Progress Notes (Signed)
Bilateral knee x-ray shows arthritis changes especially at the medial joint line.  We will attempt to get the [employer] brace authorized again.

## 2020-11-23 ENCOUNTER — Ambulatory Visit (INDEPENDENT_AMBULATORY_CARE_PROVIDER_SITE_OTHER): Payer: 59 | Admitting: Physician Assistant

## 2020-11-23 ENCOUNTER — Encounter: Payer: Self-pay | Admitting: Physician Assistant

## 2020-11-23 VITALS — BP 120/80 | HR 74 | Ht 68.0 in | Wt 199.8 lb

## 2020-11-23 DIAGNOSIS — K625 Hemorrhage of anus and rectum: Secondary | ICD-10-CM | POA: Diagnosis not present

## 2020-11-23 DIAGNOSIS — K5900 Constipation, unspecified: Secondary | ICD-10-CM | POA: Diagnosis not present

## 2020-11-23 DIAGNOSIS — K219 Gastro-esophageal reflux disease without esophagitis: Secondary | ICD-10-CM | POA: Diagnosis not present

## 2020-11-23 DIAGNOSIS — Z1211 Encounter for screening for malignant neoplasm of colon: Secondary | ICD-10-CM

## 2020-11-23 DIAGNOSIS — K589 Irritable bowel syndrome without diarrhea: Secondary | ICD-10-CM | POA: Diagnosis not present

## 2020-11-23 MED ORDER — GOLYTELY 236 G PO SOLR: 4000.0000 mL | Freq: Once | ORAL | 0 refills | Status: AC

## 2020-11-23 NOTE — Progress Notes (Signed)
Subjective:    Patient ID: Adam Vaughn, male    DOB: Jun 29, 1976, 45 y.o.   MRN: [id number]  HPI Adam Vaughn" is a 45 year old white male, new to GI today referred by Dr. Alain Vaughn with complaints of alternating bowel habits, frequent constipation, intermittent rectal bleeding and consideration of colonoscopy. Patient has not had any prior GI evaluation. He does have long history of GERD symptoms and is currently on Nexium 40 mg once daily with fairly good control.  He says he has been on medications for reflux of her the past multiple years.  He has no complaints of dysphagia or diet aphasia, he does have intermittent nocturnal symptoms. He underwent laparoscopic cholecystectomy very recently in April 2022.  He feels that he is having more issues with his bowels postcholecystectomy.  He has episodes of going back and forth between constipation and loose stools but recently may go several days without a bowel movement.  He has been using as needed MiraLAX and a stool softener.  He does find that if he takes the MiraLAX pretty regularly that will work.  He has been drinking a lot of water. He feels that he has had hemorrhoid symptoms for years and has seen a small amount of bright red blood off and on but more recently has noted blood on the stool and in the commode over the past couple of months.  He has no current complaints of abdominal pain, appetite has been okay, weight has been stable. He had back surgery in October 2021 and has been requiring chronic pain medication in the form of oxycodone which he takes twice daily to 3 times daily for chronic pain.  He says his back is worse postoperatively. Family history is negative for colon cancer  as far as he is aware.  Review of Systems Pertinent positive and negative review of systems were noted in the above HPI section.  All other review of systems was otherwise negative.  Outpatient Encounter Medications as of 11/23/2020  Medication Sig  .  Cholecalciferol (VITAMIN D3) 50 MCG (2000 UT) capsule Take 1 capsule (2,000 Units total) by mouth daily.  . Cyanocobalamin (VITAMIN B-12) 1000 MCG SUBL Place 1 tablet (1,000 mcg total) under the tongue daily.  [name] esomeprazole (NEXIUM) 40 MG capsule Take 1 capsule (40 mg total) by mouth 2 (two) times daily before a meal.  . fluticasone (FLONASE) 50 MCG/ACT nasal spray Place 2 sprays into both nostrils daily.  . naproxen (NAPROSYN) 500 MG tablet Take 1 tablet (500 mg total) by mouth 2 (two) times daily as needed for moderate pain.  [name] ondansetron (ZOFRAN-ODT) 4 MG disintegrating tablet Take 1 tablet (4 mg total) by mouth every 6 (six) hours as needed for nausea or vomiting.  [name] oxyCODONE 10 MG TABS Take 1 tablet (10 mg total) by mouth every 3 (three) hours as needed for severe pain ((score 7 to 10)).  [name] oxymetazoline (AFRIN) 0.05 % nasal spray Place 1 spray into both nostrils at bedtime.  . polyethylene glycol (GOLYTELY) 236 g solution Take 4,000 mLs by mouth once for 1 dose.   No facility-administered encounter medications on file as of 11/23/2020.   Allergies  Allergen Reactions  . Gabapentin     Twitching muscles  . Lyrica Cr [Pregabalin Er]     Pt reports rx made him very irritable and irritated his liver   Patient Active Problem List   Diagnosis Date Noted  . Allergic rhinitis 10/25/2020  . Superficial phlebitis 10/25/2020  .  Family history of acute polio 10/25/2020  . Chest pain, atypical 10/10/2020  . S/P laparoscopic cholecystectomy 10/10/2020  . Hyperglycemia 10/10/2020  . Acute cholecystitis 09/30/2020  . Insomnia 09/26/2020  . Knee pain 06/23/2020  . Low vitamin B12 level 05/31/2020  . Ankle sprain 05/31/2020  . Constipation by delayed colonic transit 05/31/2020  . Degenerative spondylolisthesis 04/11/2020  . 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 DDD (degenerative disc disease), lumbar 01/20/2020  . Left lumbar radiculitis 01/20/2020  .  Vitamin D deficiency 01/15/2020  . Low back pain 01/12/2020  . IBS (irritable bowel syndrome) 01/12/2020  . Nephrolithiasis 01/12/2020  . Erectile dysfunction 01/12/2020  . GERD (gastroesophageal reflux disease) 01/12/2020   Social History   Socioeconomic History  . Marital status: Married    Spouse name: Not on file  . Number of children: Not on file  . Years of education: Not on file  . Highest education level: Not on file  Occupational History  . Not on file  Tobacco Use  . Smoking status: Never Smoker  . Smokeless tobacco: Never Used  Vaping Use  . Vaping Use: Never used  Substance and Sexual Activity  . Alcohol use: Yes    Comment: occ  . Drug use: Never  . Sexual activity: Not Currently    Birth control/protection: Surgical    Comment: vastectomy  Other Topics Concern  . Not on file  Social History Narrative  . Not on file   Social Determinants of Health   Financial Resource Strain: Not on file  Food Insecurity: Not on file  Transportation Needs: Not on file  Physical Activity: Not on file  Stress: Not on file  Social Connections: Not on file  Intimate Partner Violence: Not on file    [name] family history includes Colon polyps in his sister; Diabetes in his sister; Healthy in his brother, brother, brother, brother, brother, brother, sister, sister, and sister; Heart attack in his father; Heart disease in his sister; Hypertension in his father; Other in his brother and father.      Objective:    Vitals:   11/23/20 1009  BP: 120/80  Pulse: 74  SpO2: 98%    Physical Exam Well-developed well-nourished white male in no acute distress.  Height, Weight, 199 BMI 30.3  HEENT; nontraumatic normocephalic, EOMI, PE R LA, sclera anicteric. Oropharynx; not examined today Neck; supple, no JVD Cardiovascular; regular rate and rhythm with S1-S2, no murmur rub or gallop Pulmonary; Clear bilaterally Abdomen; soft, nontender, nondistended, no palpable mass  or hepatosplenomegaly, bowel sounds are active Rectal; not done today Skin; benign exam, no jaundice rash or appreciable lesions Extremities; no clubbing cyanosis or edema skin warm and dry Neuro/Psych; alert and oriented x4, grossly nonfocal mood and affect appropriate       Assessment & Plan:   #29 44 year old white male with chronic long-term GERD, requiring prescription medication for longer than 10 years.  Currently on Nexium 40 mg once daily, fairly good control of symptoms though does have intermittent nocturnal symptoms. Rule out Barrett's  #2 status post very recent cholecystectomy April 2022 #3 alternating bowel habits, primarily with constipation currently #4 intermittent hematochezia, increased since his recent surgery and has noted blood in the commode and on the stool.  This may be secondary to internal hemorrhoids, rule out occult neoplasm, or other inflammatory process  Plan; continue Nexium 40 mg p.o. every morning AC We discussed an antireflux regimen including n.p.o. for 2 to 3 hours  prior to bedtime and elevation of the back 45 degrees for sleep. Patient should have the EGD with long-term GERD symptoms requiring medication. He will continue MiraLAX 17 g in 8 ounces of water daily, advised okay to take 2 doses daily as needed Continue liberal water intake at least 60 ounces per day. Patient will be scheduled for colonoscopy and EGD with Dr. Candis Schatz.  Both procedures were discussed in detail with patient including indications risks and benefits and he is agreeable to proceed.  Will CC chart to Dr. Avie Echevaria today.  Mckenzi Buonomo S Takia Runyon PA-C 11/23/2020   Cc: Plotnikov, Evie Lacks, MD

## 2020-11-23 NOTE — Patient Instructions (Addendum)
If you are age 45 or older, your body mass index should be between 23-30. Your Body mass index is 30.38 kg/m. If this is out of the aforementioned range listed, please consider follow up with your Primary Care Provider.  If you are age 55 or younger, your body mass index should be between 19-25. Your Body mass index is 30.38 kg/m. If this is out of the aformentioned range listed, please consider follow up with your Primary Care Provider.   You have been scheduled for a colonoscopy. Please follow written instructions given to you at your visit today.  Please pick up your prep supplies at the pharmacy within the next 1-3 days. If you use inhalers (even only as needed), please bring them with you on the day of your procedure.  Continue Nexium 40 mg 1 capsule in the morning before breakfast.  START Miralax 1 capful in 8 ounces of water or juice once to twice daily.  Follow a GERD diet and Anti-Reflux regimen.  Follow up pending at this time.  Thank you for entrusting me with your care and choosing [facility].  Amy Esterwood, PA-C

## 2020-11-24 NOTE — Progress Notes (Signed)
I agree with the above note, plan 

## 2020-11-30 ENCOUNTER — Telehealth: Payer: Self-pay | Admitting: Family Medicine

## 2020-11-30 NOTE — Telephone Encounter (Signed)
Pt states that [employer] called him to advise they are cancelling the brace order as insurance "denied" it. Pt states [employer] advised we have been unable to reach [facility], so I am unsure if this has actually been denied or at what point in the process this is in.

## 2020-12-05 NOTE — Telephone Encounter (Signed)
I sent Adam Vaughn with [employer] and email to see if he can look into this further for me.

## 2020-12-09 NOTE — Telephone Encounter (Signed)
Patient's braces were denied authorization due to insurance non covered benefits. Dr. Georgina Snell was unable to get in touch with [employer] to request the appeal.

## 2020-12-27 ENCOUNTER — Ambulatory Visit: Payer: 59 | Admitting: Internal Medicine

## 2020-12-27 ENCOUNTER — Other Ambulatory Visit: Payer: Self-pay

## 2020-12-27 ENCOUNTER — Encounter: Payer: Self-pay | Admitting: Gastroenterology

## 2020-12-27 ENCOUNTER — Ambulatory Visit (INDEPENDENT_AMBULATORY_CARE_PROVIDER_SITE_OTHER): Payer: 59 | Admitting: Internal Medicine

## 2020-12-27 ENCOUNTER — Encounter: Payer: Self-pay | Admitting: Internal Medicine

## 2020-12-27 DIAGNOSIS — I809 Phlebitis and thrombophlebitis of unspecified site: Secondary | ICD-10-CM

## 2020-12-27 DIAGNOSIS — G8929 Other chronic pain: Secondary | ICD-10-CM

## 2020-12-27 DIAGNOSIS — K580 Irritable bowel syndrome with diarrhea: Secondary | ICD-10-CM | POA: Diagnosis not present

## 2020-12-27 DIAGNOSIS — M25561 Pain in right knee: Secondary | ICD-10-CM | POA: Diagnosis not present

## 2020-12-27 DIAGNOSIS — M545 Low back pain, unspecified: Secondary | ICD-10-CM | POA: Diagnosis not present

## 2020-12-27 DIAGNOSIS — M48062 Spinal stenosis, lumbar region with neurogenic claudication: Secondary | ICD-10-CM | POA: Diagnosis not present

## 2020-12-27 LAB — COMPREHENSIVE METABOLIC PANEL
ALT: 36 U/L (ref 0–53)
AST: 17 U/L (ref 0–37)
Albumin: 4.4 g/dL (ref 3.5–5.2)
Alkaline Phosphatase: 88 U/L (ref 39–117)
BUN: 13 mg/dL (ref 6–23)
CO2: 27 mEq/L (ref 19–32)
Calcium: 9.2 mg/dL (ref 8.4–10.5)
Chloride: 105 mEq/L (ref 96–112)
Creatinine, Ser: 1.04 mg/dL (ref 0.40–1.50)
GFR: 86.82 mL/min (ref >60.00)
Glucose, Bld: 96 mg/dL (ref 70–99)
Potassium: 3.9 mEq/L (ref 3.5–5.1)
Sodium: 139 mEq/L (ref 135–145)
Total Bilirubin: 0.5 mg/dL (ref 0.2–1.2)
Total Protein: 6.7 g/dL (ref 6.0–8.3)

## 2020-12-27 MED ORDER — TIZANIDINE HCL 2 MG PO TABS: 2.0000 mg | ORAL_TABLET | Freq: Every evening | ORAL | 5 refills | Status: DC | PRN

## 2020-12-27 NOTE — Assessment & Plan Note (Signed)
B - using braces

## 2020-12-27 NOTE — Progress Notes (Signed)
Subjective:  Patient ID: Adam Vaughn, male    DOB: 28-Mar-1976  Age: 45 y.o. MRN: [id number]  CC: Follow-up (2 month f/u)   HPI Adam Vaughn presents for LBP, B knee pain, R buttock has a stinging pain C/o spasms in the back Seeing Dr Cherie Dark - [place] [facility]    Outpatient Medications Prior to Visit  Medication Sig Dispense Refill   Cholecalciferol (VITAMIN D3) 50 MCG (2000 UT) capsule Take 1 capsule (2,000 Units total) by mouth daily. 100 capsule 3   Cyanocobalamin (VITAMIN B-12) 1000 MCG SUBL Place 1 tablet (1,000 mcg total) under the tongue daily. 100 tablet 3   esomeprazole (NEXIUM) 40 MG capsule Take 1 capsule (40 mg total) by mouth 2 (two) times daily before a meal. 180 capsule 3   fluticasone (FLONASE) 50 MCG/ACT nasal spray Place 2 sprays into both nostrils daily. 16 g 6   naproxen (NAPROSYN) 500 MG tablet Take 1 tablet (500 mg total) by mouth 2 (two) times daily as needed for moderate pain. 60 tablet 3   oxymetazoline (AFRIN) 0.05 % nasal spray Place 1 spray into both nostrils at bedtime.     ondansetron (ZOFRAN-ODT) 4 MG disintegrating tablet Take 1 tablet (4 mg total) by mouth every 6 (six) hours as needed for nausea or vomiting. (Patient not taking: Reported on 12/27/2020) 15 tablet 0   oxyCODONE 10 MG TABS Take 1 tablet (10 mg total) by mouth every 3 (three) hours as needed for severe pain ((score 7 to 10)). (Patient not taking: Reported on 12/27/2020) 40 tablet 0   No facility-administered medications prior to visit.    ROS: Review of Systems  Constitutional:  Positive for fatigue. Negative for appetite change and unexpected weight change.  HENT:  Negative for congestion, nosebleeds, sneezing, sore throat and trouble swallowing.   Eyes:  Negative for itching and visual disturbance.  Respiratory:  Negative for cough.   Cardiovascular:  Negative for chest pain, palpitations and leg swelling.  Gastrointestinal:  Negative for abdominal distention, blood  in stool, diarrhea and nausea.  Genitourinary:  Negative for frequency and hematuria.  Musculoskeletal:  Positive for arthralgias, back pain and gait problem. Negative for joint swelling and neck pain.  Skin:  Negative for rash.  Neurological:  Negative for dizziness, tremors, speech difficulty and weakness.  Psychiatric/Behavioral:  Negative for agitation, dysphoric mood and sleep disturbance. The patient is not nervous/anxious.    Objective:  BP 130/78 (BP Location: Left Arm)   Pulse 78   Temp 98.2 F (36.8 C) (Oral)   Ht 5\' 8"  (1.727 m)   Wt 202 lb (91.6 kg)   SpO2 97%   BMI 30.71 kg/m   BP Readings from Last 3 Encounters:  12/27/20 130/78  11/23/20 120/80  11/03/20 (!) 145/97    Wt Readings from Last 3 Encounters:  12/27/20 202 lb (91.6 kg)  11/23/20 199 lb 12.8 oz (90.6 kg)  11/03/20 197 lb 9.6 oz (89.6 kg)    Physical Exam Constitutional:      General: He is not in acute distress.    Appearance: He is well-developed.     Comments: NAD  Eyes:     Conjunctiva/sclera: Conjunctivae normal.     Pupils: Pupils are equal, round, and reactive to light.  Neck:     Thyroid: No thyromegaly.     Vascular: No JVD.  Cardiovascular:     Rate and Rhythm: Normal rate and regular rhythm.     Heart sounds: Normal heart sounds.  No murmur heard.   No friction rub. No gallop.  Pulmonary:     Effort: Pulmonary effort is normal. No respiratory distress.     Breath sounds: Normal breath sounds. No wheezing or rales.  Chest:     Chest wall: No tenderness.  Abdominal:     General: Bowel sounds are normal. There is no distension.     Palpations: Abdomen is soft. There is no mass.     Tenderness: There is no abdominal tenderness. There is no guarding or rebound.  Musculoskeletal:        General: Tenderness present. Normal range of motion.     Cervical back: Normal range of motion.  Lymphadenopathy:     Cervical: No cervical adenopathy.  Skin:    General: Skin is warm and dry.      Findings: No rash.  Neurological:     Mental Status: He is alert and oriented to person, place, and time.     Cranial Nerves: No cranial nerve deficit.     Motor: No weakness or abnormal muscle tone.     Coordination: Coordination abnormal.     Gait: Gait abnormal.     Deep Tendon Reflexes: Reflexes are normal and symmetric.  Psychiatric:        Behavior: Behavior normal.        Thought Content: Thought content normal.        Judgment: Judgment normal.   Braces on B knees R buttock NT  Lab Results  Component Value Date   WBC 6.3 10/10/2020   HGB 15.0 10/10/2020   HCT 44.1 10/10/2020   PLT 324.0 10/10/2020   GLUCOSE 102 (H) 10/10/2020   ALT 124 (H) 10/10/2020   AST 32 10/10/2020   NA 138 10/10/2020   K 4.1 10/10/2020   CL 102 10/10/2020   CREATININE 0.85 10/10/2020   BUN 14 10/10/2020   CO2 29 10/10/2020   TSH 1.26 01/14/2020   HGBA1C 5.6 10/10/2020    CT Angio Chest W/Cm &/Or Wo Cm  Result Date: 10/11/2020 CLINICAL DATA:  Positive D-dimer, right-sided pleuritic chest pain status post recent cholecystectomy EXAM: CT ANGIOGRAPHY CHEST WITH CONTRAST TECHNIQUE: Multidetector CT imaging of the chest was performed using the standard protocol during bolus administration of intravenous contrast. Multiplanar CT image reconstructions and MIPs were obtained to evaluate the vascular anatomy. CONTRAST:  1mL ISOVUE-370 IOPAMIDOL (ISOVUE-370) INJECTION 76% COMPARISON:  None. FINDINGS: Cardiovascular: Satisfactory opacification of the pulmonary arteries to the segmental level. No evidence of pulmonary embolism. Normal heart size. No pericardial effusion. Four vessel aortic arch. The left vertebral artery arises directly from the aorta. Mediastinum/Nodes: Unremarkable CT appearance of the thyroid gland. No suspicious mediastinal or hilar adenopathy. No soft tissue mediastinal mass. The thoracic esophagus is unremarkable. Lungs/Pleura: Lungs are clear. No pleural effusion or pneumothorax. Upper  Abdomen: No acute abnormality. Surgical clips in the right upper quadrant consistent with prior cholecystectomy. No abnormality visualized within the gallbladder fossa. Musculoskeletal: No chest wall abnormality. No acute or significant osseous findings. Review of the MIP images confirms the above findings. IMPRESSION: Negative for acute pulmonary embolus, pneumonia or other acute cardiopulmonary process. Electronically Signed   By: Jacqulynn Cadet M.D.   On: 10/11/2020 11:32    Assessment & Plan:     Follow-up: No follow-ups on file.  Walker Kehr, MD

## 2020-12-27 NOTE — Addendum Note (Signed)
Addended by: Boris Lown B on: 12/27/2020 10:23 AM   Modules accepted: Orders

## 2020-12-27 NOTE — Assessment & Plan Note (Signed)
C/o spasms in the back - try Zanaflex Seeing Dr Cherie Dark - [place] [facility] - On Oxy

## 2020-12-27 NOTE — Assessment & Plan Note (Signed)
Seeing Dr Cherie Dark - [place] [facility] On Oxy

## 2021-01-27 ENCOUNTER — Telehealth (INDEPENDENT_AMBULATORY_CARE_PROVIDER_SITE_OTHER): Payer: 59 | Admitting: Internal Medicine

## 2021-01-27 ENCOUNTER — Encounter: Payer: Self-pay | Admitting: Internal Medicine

## 2021-01-27 VITALS — HR 84 | Ht 68.0 in | Wt 200.0 lb

## 2021-01-27 DIAGNOSIS — U071 COVID-19: Secondary | ICD-10-CM | POA: Diagnosis not present

## 2021-01-27 MED ORDER — MOLNUPIRAVIR EUA 200MG CAPSULE: 4.0000 | ORAL_CAPSULE | Freq: Two times a day (BID) | ORAL | 0 refills | Status: AC

## 2021-01-27 NOTE — Progress Notes (Signed)
Subjective:    Patient ID: Adam Vaughn, male    DOB: 1975-07-29, 45 y.o.   MRN: [id number]  DOS:  01/27/2021 Type of visit - description: Virtual Visit via Video Note  I connected with the above patient  by a video enabled telemedicine application and verified that I am speaking with the correct person using two identifiers.   THIS ENCOUNTER IS A VIRTUAL VISIT DUE TO COVID-19 - PATIENT WAS NOT SEEN IN THE OFFICE. PATIENT HAS CONSENTED TO VIRTUAL VISIT / TELEMEDICINE VISIT   Location of patient: home  Location of provider: office  Persons participating in the virtual visit: patient, provider   I discussed the limitations of evaluation and management by telemedicine and the availability of in person appointments. The patient expressed understanding and agreed to proceed. Acute Symptoms a started 2 days ago: Severe sore throat, fever up to 102.0, chills, myalgias, headache, sinus congestion. Yesterday he tested + for COVID.   Review of Systems Denies nausea, vomiting or diarrhea. Has some cough with yellowish sputum, no hemoptysis. No leg swelling. No actual shortness of breath but he reports diffuse chest discomfort with deep breathing.  Past Medical History:  Diagnosis Date   Arthritis    GERD (gastroesophageal reflux disease)    History of kidney stones    Hypertension    Kidney stones     Past Surgical History:  Procedure Laterality Date   BACK SURGERY  04/11/2020   CHOLECYSTECTOMY N/A 10/01/2020   Procedure: LAPAROSCOPIC CHOLECYSTECTOMY;  Surgeon: Autumn Messing III, MD;  Location: [facility];  Service: General;  Laterality: N/A;   COLONOSCOPY     HAND SURGERY Bilateral    TONSILLECTOMY     VASECTOMY      Allergies as of 01/27/2021       Reactions   Gabapentin    Twitching muscles   Lyrica Cr [pregabalin Er]    Pt reports rx made him very irritable and irritated his liver        Medication List        Accurate as of January 27, 2021 11:31 AM. If you have  any questions, ask your nurse or doctor.          esomeprazole 40 MG capsule Commonly known as: NexIUM Take 1 capsule (40 mg total) by mouth 2 (two) times daily before a meal.   fluticasone 50 MCG/ACT nasal spray Commonly known as: FLONASE Place 2 sprays into both nostrils daily.   naproxen 500 MG tablet Commonly known as: NAPROSYN Take 1 tablet (500 mg total) by mouth 2 (two) times daily as needed for moderate pain.   oxymetazoline 0.05 % nasal spray Commonly known as: AFRIN Place 1 spray into both nostrils at bedtime.   pregabalin 75 MG capsule Commonly known as: LYRICA Take 75 mg by mouth daily.   tiZANidine 2 MG tablet Commonly known as: ZANAFLEX Take 1-2 tablets (2-4 mg total) by mouth at bedtime as needed for muscle spasms.   Vitamin B-12 1000 MCG Subl Place 1 tablet (1,000 mcg total) under the tongue daily.   Vitamin D3 50 MCG (2000 UT) capsule Take 1 capsule (2,000 Units total) by mouth daily.           Objective:   Physical Exam Pulse 84   Ht '5\' 8"'$  (1.727 m)   Wt 200 lb (90.7 kg)   SpO2 98%   BMI 30.41 kg/m  This is a virtual video visit, he is resting in bed, no distress, no cough  or chest congestion noted.  Speaking in complete sentences.    Assessment     45 year old gentleman, unvaccinated, no history of hypertension, diabetes or morbid obesity, normal renal function presents with:  COVID infection: Symptoms developed 2 days ago, again he is unvaccinated, has  moderate symptoms, no hypoxia. He does qualify for treatment, internal data from our local hospital indicates that molnupiravir is more effective than the Paxlovid. No criteria for monoclonal infusion. Plan: Molnupiravir Rest, fluids, Tylenol, check O2 sats, if less than 94%: ER.  Encouraged to monitor his symptoms closely, if he gets sicker, has severe chest pain, difficulty breathing, O2 sat less than 94%: Needs to go to the ER. Also encouraged him to get vaccinated in 1 month, he  seems receptive to the advise   I discussed the assessment and treatment plan with the patient. The patient was provided an opportunity to ask questions and all were answered. The patient agreed with the plan and demonstrated an understanding of the instructions.   The patient was advised to call back or seek an in-person evaluation if the symptoms worsen or if the condition fails to improve as anticipated.

## 2021-01-30 ENCOUNTER — Encounter: Payer: 59 | Admitting: Gastroenterology

## 2021-02-17 ENCOUNTER — Other Ambulatory Visit: Payer: Self-pay

## 2021-02-17 ENCOUNTER — Ambulatory Visit (INDEPENDENT_AMBULATORY_CARE_PROVIDER_SITE_OTHER): Payer: 59

## 2021-02-17 ENCOUNTER — Ambulatory Visit (INDEPENDENT_AMBULATORY_CARE_PROVIDER_SITE_OTHER): Payer: 59 | Admitting: Internal Medicine

## 2021-02-17 ENCOUNTER — Encounter: Payer: Self-pay | Admitting: Internal Medicine

## 2021-02-17 VITALS — BP 126/86 | HR 84 | Temp 98.0°F | Ht 68.0 in | Wt 200.0 lb

## 2021-02-17 DIAGNOSIS — R7989 Other specified abnormal findings of blood chemistry: Secondary | ICD-10-CM

## 2021-02-17 DIAGNOSIS — R42 Dizziness and giddiness: Secondary | ICD-10-CM | POA: Diagnosis not present

## 2021-02-17 DIAGNOSIS — E538 Deficiency of other specified B group vitamins: Secondary | ICD-10-CM | POA: Diagnosis not present

## 2021-02-17 DIAGNOSIS — M542 Cervicalgia: Secondary | ICD-10-CM

## 2021-02-17 DIAGNOSIS — R509 Fever, unspecified: Secondary | ICD-10-CM

## 2021-02-17 DIAGNOSIS — M545 Low back pain, unspecified: Secondary | ICD-10-CM

## 2021-02-17 DIAGNOSIS — W19XXXA Unspecified fall, initial encounter: Secondary | ICD-10-CM

## 2021-02-17 DIAGNOSIS — G4452 New daily persistent headache (NDPH): Secondary | ICD-10-CM | POA: Diagnosis not present

## 2021-02-17 DIAGNOSIS — R519 Headache, unspecified: Secondary | ICD-10-CM | POA: Insufficient documentation

## 2021-02-17 DIAGNOSIS — R296 Repeated falls: Secondary | ICD-10-CM | POA: Insufficient documentation

## 2021-02-17 DIAGNOSIS — M48062 Spinal stenosis, lumbar region with neurogenic claudication: Secondary | ICD-10-CM

## 2021-02-17 DIAGNOSIS — G8929 Other chronic pain: Secondary | ICD-10-CM

## 2021-02-17 LAB — CBC WITH DIFFERENTIAL/PLATELET
Basophils Absolute: 0 10*3/uL (ref 0.0–0.1)
Basophils Relative: 0.3 % (ref 0.0–3.0)
Eosinophils Absolute: 0 10*3/uL (ref 0.0–0.7)
Eosinophils Relative: 0.2 % (ref 0.0–5.0)
HCT: 42.2 % (ref 39.0–52.0)
Hemoglobin: 14.4 g/dL (ref 13.0–17.0)
Lymphocytes Relative: 32.1 % (ref 12.0–46.0)
Lymphs Abs: 1.2 10*3/uL (ref 0.7–4.0)
MCHC: 34.2 g/dL (ref 30.0–36.0)
MCV: 84.2 fl (ref 78.0–100.0)
Monocytes Absolute: 1 10*3/uL (ref 0.1–1.0)
Monocytes Relative: 26 % — ABNORMAL HIGH (ref 3.0–12.0)
Neutro Abs: 1.6 10*3/uL (ref 1.4–7.7)
Neutrophils Relative %: 41.4 % — ABNORMAL LOW (ref 43.0–77.0)
Platelets: 195 10*3/uL (ref 150.0–400.0)
RBC: 5.02 Mil/uL (ref 4.22–5.81)
RDW: 12.7 % (ref 11.5–15.5)
WBC: 3.8 10*3/uL — ABNORMAL LOW (ref 4.0–10.5)

## 2021-02-17 LAB — URINALYSIS
Bilirubin Urine: NEGATIVE
Hgb urine dipstick: NEGATIVE
Ketones, ur: NEGATIVE
Leukocytes,Ua: NEGATIVE
Nitrite: NEGATIVE
Specific Gravity, Urine: >=1.03 — AB (ref 1.000–1.030)
Total Protein, Urine: NEGATIVE
Urine Glucose: NEGATIVE
Urobilinogen, UA: 1 (ref 0.0–1.0)
pH: 6 (ref 5.0–8.0)

## 2021-02-17 LAB — COMPREHENSIVE METABOLIC PANEL
ALT: 72 U/L — ABNORMAL HIGH (ref 0–53)
AST: 36 U/L (ref 0–37)
Albumin: 4.4 g/dL (ref 3.5–5.2)
Alkaline Phosphatase: 103 U/L (ref 39–117)
BUN: 14 mg/dL (ref 6–23)
CO2: 28 mEq/L (ref 19–32)
Calcium: 9.6 mg/dL (ref 8.4–10.5)
Chloride: 101 mEq/L (ref 96–112)
Creatinine, Ser: 0.88 mg/dL (ref 0.40–1.50)
GFR: 103.87 mL/min (ref >60.00)
Glucose, Bld: 96 mg/dL (ref 70–99)
Potassium: 4.2 mEq/L (ref 3.5–5.1)
Sodium: 137 mEq/L (ref 135–145)
Total Bilirubin: 0.6 mg/dL (ref 0.2–1.2)
Total Protein: 7.3 g/dL (ref 6.0–8.3)

## 2021-02-17 LAB — SEDIMENTATION RATE: Sed Rate: 16 mm/hr — ABNORMAL HIGH (ref 0–15)

## 2021-02-17 LAB — VITAMIN B12: Vitamin B-12: 416 pg/mL (ref 211–911)

## 2021-02-17 MED ORDER — METHYLPREDNISOLONE 4 MG PO TBPK: ORAL_TABLET | ORAL | 0 refills | Status: DC

## 2021-02-17 MED ORDER — DOXYCYCLINE HYCLATE 100 MG PO TABS: 100.0000 mg | ORAL_TABLET | Freq: Two times a day (BID) | ORAL | 0 refills | Status: DC

## 2021-02-17 NOTE — Progress Notes (Signed)
I  Subjective:  Patient ID: Adam Vaughn, male    DOB: September 17, 1975  Age: 45 y.o. MRN: [id number]  CC: Neck Pain (Shoulder pain and dizziness x 1 wk.  )   HPI Adam Vaughn presents for HA, dizziness-getting worse over past 2 weeks Pt fell x2 in 2 days due to dizziness - no LOC, no head injury C/o a boil on the R neck; pt had a tick bite -it is resolving Pt had COVID  1 month ago. COVID made his LBP much worse He has been taking his pain meds consistently. Is complaining of cervical pain, worse lately.  Something is clicking in his neck  It is a complicated situation     Outpatient Medications Prior to Visit  Medication Sig Dispense Refill   Cholecalciferol (VITAMIN D3) 50 MCG (2000 UT) capsule Take 1 capsule (2,000 Units total) by mouth daily. 100 capsule 3   Cyanocobalamin (VITAMIN B-12) 1000 MCG SUBL Place 1 tablet (1,000 mcg total) under the tongue daily. 100 tablet 3   esomeprazole (NEXIUM) 40 MG capsule Take 1 capsule (40 mg total) by mouth 2 (two) times daily before a meal. 180 capsule 3   fluticasone (FLONASE) 50 MCG/ACT nasal spray Place 2 sprays into both nostrils daily. 16 g 6   naproxen (NAPROSYN) 500 MG tablet Take 1 tablet (500 mg total) by mouth 2 (two) times daily as needed for moderate pain. 60 tablet 3   oxymetazoline (AFRIN) 0.05 % nasal spray Place 1 spray into both nostrils at bedtime.     pregabalin (LYRICA) 75 MG capsule Take 75 mg by mouth daily.     tiZANidine (ZANAFLEX) 2 MG tablet Take 1-2 tablets (2-4 mg total) by mouth at bedtime as needed for muscle spasms. 60 tablet 5   No facility-administered medications prior to visit.    ROS: Review of Systems  Constitutional:  Positive for chills, fatigue and fever. Negative for appetite change and unexpected weight change.  HENT:  Positive for congestion. Negative for nosebleeds, sneezing, sore throat and trouble swallowing.   Eyes:  Negative for itching and visual disturbance.  Respiratory:   Negative for cough.   Cardiovascular:  Negative for chest pain, palpitations and leg swelling.  Gastrointestinal:  Negative for abdominal distention, blood in stool, diarrhea and nausea.  Genitourinary:  Negative for frequency and hematuria.  Musculoskeletal:  Positive for arthralgias, back pain, gait problem, neck pain and neck stiffness. Negative for joint swelling.  Skin:  Negative for color change and rash.  Neurological:  Positive for dizziness, weakness, light-headedness and headaches. Negative for tremors, seizures, facial asymmetry and speech difficulty.  Hematological:  Does not bruise/bleed easily.  Psychiatric/Behavioral:  Positive for decreased concentration. Negative for agitation, dysphoric mood, sleep disturbance and suicidal ideas. The patient is not nervous/anxious.    Objective:  BP 126/86   Pulse 84   Temp 98 F (36.7 C) (Oral)   Ht '5\' 8"'$  (1.727 m)   Wt 200 lb (90.7 kg)   SpO2 98%   BMI 30.41 kg/m   BP Readings from Last 3 Encounters:  02/17/21 126/86  12/27/20 130/78  11/23/20 120/80    Wt Readings from Last 3 Encounters:  02/17/21 200 lb (90.7 kg)  01/27/21 200 lb (90.7 kg)  12/27/20 202 lb (91.6 kg)    Physical Exam Constitutional:      General: He is not in acute distress.    Appearance: He is well-developed. He is not ill-appearing or toxic-appearing.     Comments:  NAD  Eyes:     Conjunctiva/sclera: Conjunctivae normal.     Pupils: Pupils are equal, round, and reactive to light.  Neck:     Thyroid: No thyromegaly.     Vascular: No JVD.  Cardiovascular:     Rate and Rhythm: Normal rate and regular rhythm.     Heart sounds: Normal heart sounds. No murmur heard.   No friction rub. No gallop.  Pulmonary:     Effort: Pulmonary effort is normal. No respiratory distress.     Breath sounds: Normal breath sounds. No wheezing or rales.  Chest:     Chest wall: No tenderness.  Abdominal:     General: Bowel sounds are normal. There is no distension.      Palpations: Abdomen is soft. There is no mass.     Tenderness: There is no abdominal tenderness. There is no guarding or rebound.  Musculoskeletal:        General: Tenderness present. Normal range of motion.     Cervical back: Normal range of motion.     Right lower leg: No edema.     Left lower leg: No edema.  Lymphadenopathy:     Cervical: No cervical adenopathy.  Skin:    General: Skin is warm and dry.     Findings: No rash.  Neurological:     Mental Status: He is alert and oriented to person, place, and time.     Cranial Nerves: No cranial nerve deficit.     Motor: No abnormal muscle tone.     Coordination: Coordination abnormal.     Gait: Gait abnormal.     Deep Tendon Reflexes: Reflexes are normal and symmetric.  Psychiatric:        Behavior: Behavior normal.        Thought Content: Thought content normal.        Judgment: Judgment normal.  Pt "spaced out "and started to tilt to the L when checked for eye ROM.  It lasted for couple seconds.  There is was no loss of consciousness or seizure B braces on the knees.  His gait is antalgic LS, cerv spine - pain w/ROM A ?boil scar on R neck base Cervical spine is painful range of motion Muscle strength normal  Lab Results  Component Value Date   WBC 6.3 10/10/2020   HGB 15.0 10/10/2020   HCT 44.1 10/10/2020   PLT 324.0 10/10/2020   GLUCOSE 96 12/27/2020   ALT 36 12/27/2020   AST 17 12/27/2020   NA 139 12/27/2020   K 3.9 12/27/2020   CL 105 12/27/2020   CREATININE 1.04 12/27/2020   BUN 13 12/27/2020   CO2 27 12/27/2020   TSH 1.26 01/14/2020   HGBA1C 5.6 10/10/2020    CT Angio Chest W/Cm &/Or Wo Cm  Result Date: 10/11/2020 CLINICAL DATA:  Positive D-dimer, right-sided pleuritic chest pain status post recent cholecystectomy EXAM: CT ANGIOGRAPHY CHEST WITH CONTRAST TECHNIQUE: Multidetector CT imaging of the chest was performed using the standard protocol during bolus administration of intravenous contrast. Multiplanar  CT image reconstructions and MIPs were obtained to evaluate the vascular anatomy. CONTRAST:  41m ISOVUE-370 IOPAMIDOL (ISOVUE-370) INJECTION 76% COMPARISON:  None. FINDINGS: Cardiovascular: Satisfactory opacification of the pulmonary arteries to the segmental level. No evidence of pulmonary embolism. Normal heart size. No pericardial effusion. Four vessel aortic arch. The left vertebral artery arises directly from the aorta. Mediastinum/Nodes: Unremarkable CT appearance of the thyroid gland. No suspicious mediastinal or hilar adenopathy. No soft tissue mediastinal  mass. The thoracic esophagus is unremarkable. Lungs/Pleura: Lungs are clear. No pleural effusion or pneumothorax. Upper Abdomen: No acute abnormality. Surgical clips in the right upper quadrant consistent with prior cholecystectomy. No abnormality visualized within the gallbladder fossa. Musculoskeletal: No chest wall abnormality. No acute or significant osseous findings. Review of the MIP images confirms the above findings. IMPRESSION: Negative for acute pulmonary embolus, pneumonia or other acute cardiopulmonary process. Electronically Signed   By: Jacqulynn Cadet M.D.   On: 10/11/2020 11:32    Assessment & Plan:    Walker Kehr, MD

## 2021-02-17 NOTE — Assessment & Plan Note (Addendum)
x2 last nights.  Unclear etiology H/o recent tick bite -no typical rash Check CBC, ESR, CMET Po Doxy x 10 d

## 2021-02-17 NOTE — Assessment & Plan Note (Addendum)
New No driving Severe new HA is accompanying-it is getting worse.  No meningeal signs Will obtain Head CT without contrast to rule out hemorrhage, abscess and other Will obtain ESR Empiric steroids-Medrol pack Take pain meds consistently to avoid withdrawal

## 2021-02-17 NOTE — Assessment & Plan Note (Addendum)
Severe new HA -it is getting worse.  No meningeal signs Will obtain Head CT without contrast to rule out hemorrhage, abscess and other Will obtain ESR Empiric antibiotics-doxycycline Medrol pack

## 2021-02-17 NOTE — Assessment & Plan Note (Signed)
Continue with vitamin B12 

## 2021-02-17 NOTE — Assessment & Plan Note (Addendum)
Pt fell x2 in 2 days due to dizziness - no LOC.  No driving Will obtain Head CT Obtain lab work including CBC, c-Met

## 2021-02-17 NOTE — Addendum Note (Signed)
Addended by: Jacobo Forest on: 02/17/2021 09:43 AM   Modules accepted: Orders

## 2021-02-17 NOTE — Patient Instructions (Addendum)
Do not drive if dizzy, lightheaded

## 2021-02-17 NOTE — Assessment & Plan Note (Signed)
Worse Medrol pack

## 2021-02-17 NOTE — Assessment & Plan Note (Signed)
Low back pain is worse after COVID.  She saw Dr. Trenton Gammon.  One pain meds per pain clinic

## 2021-02-27 ENCOUNTER — Ambulatory Visit (INDEPENDENT_AMBULATORY_CARE_PROVIDER_SITE_OTHER)
Admission: RE | Admit: 2021-02-27 | Discharge: 2021-02-27 | Disposition: A | Discharge status: Home / Self Care | Payer: 59 | Source: Ambulatory Visit | Attending: Internal Medicine | Admitting: Internal Medicine

## 2021-02-27 ENCOUNTER — Other Ambulatory Visit: Payer: Self-pay

## 2021-02-27 DIAGNOSIS — G4452 New daily persistent headache (NDPH): Secondary | ICD-10-CM

## 2021-02-27 DIAGNOSIS — R509 Fever, unspecified: Secondary | ICD-10-CM

## 2021-02-27 DIAGNOSIS — R42 Dizziness and giddiness: Secondary | ICD-10-CM

## 2021-02-27 DIAGNOSIS — W19XXXA Unspecified fall, initial encounter: Secondary | ICD-10-CM

## 2021-02-28 ENCOUNTER — Ambulatory Visit (INDEPENDENT_AMBULATORY_CARE_PROVIDER_SITE_OTHER): Payer: 59 | Admitting: Internal Medicine

## 2021-02-28 ENCOUNTER — Encounter: Payer: Self-pay | Admitting: Internal Medicine

## 2021-02-28 VITALS — BP 128/72 | HR 98 | Temp 98.5°F | Ht 68.0 in | Wt 197.0 lb

## 2021-02-28 DIAGNOSIS — R7989 Other specified abnormal findings of blood chemistry: Secondary | ICD-10-CM

## 2021-02-28 DIAGNOSIS — R0981 Nasal congestion: Secondary | ICD-10-CM

## 2021-02-28 DIAGNOSIS — M545 Low back pain, unspecified: Secondary | ICD-10-CM

## 2021-02-28 DIAGNOSIS — G4452 New daily persistent headache (NDPH): Secondary | ICD-10-CM

## 2021-02-28 DIAGNOSIS — R42 Dizziness and giddiness: Secondary | ICD-10-CM

## 2021-02-28 DIAGNOSIS — G8929 Other chronic pain: Secondary | ICD-10-CM

## 2021-02-28 DIAGNOSIS — W19XXXA Unspecified fall, initial encounter: Secondary | ICD-10-CM

## 2021-02-28 NOTE — Assessment & Plan Note (Signed)
Resolved after Medrol pack Head CT was ok

## 2021-02-28 NOTE — Assessment & Plan Note (Signed)
Better after Medrol pack Head CT was ok

## 2021-02-28 NOTE — Assessment & Plan Note (Signed)
Chronic - ENT ref

## 2021-02-28 NOTE — Progress Notes (Signed)
Subjective:  Patient ID: Adam Vaughn, male    DOB: 10-07-1975  Age: 45 y.o. MRN: [id number]  CC: Follow-up (2 week f/u)   HPI [name] presents for vertigo, falls, HAs - much better w/Prednisone, abx.  Head CT was (-). C/o sinus congestion. Medrol pack helped LBP too.  Outpatient Medications Prior to Visit  Medication Sig Dispense Refill   Cholecalciferol (VITAMIN D3) 50 MCG (2000 UT) capsule Take 1 capsule (2,000 Units total) by mouth daily. 100 capsule 3   Cyanocobalamin (VITAMIN B-12) 1000 MCG SUBL Place 1 tablet (1,000 mcg total) under the tongue daily. 100 tablet 3   doxycycline (VIBRA-TABS) 100 MG tablet Take 1 tablet (100 mg total) by mouth 2 (two) times daily. 20 tablet 0   esomeprazole (NEXIUM) 40 MG capsule Take 1 capsule (40 mg total) by mouth 2 (two) times daily before a meal. 180 capsule 3   fluticasone (FLONASE) 50 MCG/ACT nasal spray Place 2 sprays into both nostrils daily. 16 g 6   naproxen (NAPROSYN) 500 MG tablet Take 1 tablet (500 mg total) by mouth 2 (two) times daily as needed for moderate pain. 60 tablet 3   oxymetazoline (AFRIN) 0.05 % nasal spray Place 1 spray into both nostrils at bedtime.     pregabalin (LYRICA) 75 MG capsule Take 75 mg by mouth daily.     tiZANidine (ZANAFLEX) 2 MG tablet Take 1-2 tablets (2-4 mg total) by mouth at bedtime as needed for muscle spasms. 60 tablet 5   methylPREDNISolone (MEDROL DOSEPAK) 4 MG TBPK tablet As directed (Patient not taking: Reported on 02/28/2021) 21 tablet 0   No facility-administered medications prior to visit.    ROS: Review of Systems  Constitutional:  Negative for appetite change, fatigue and unexpected weight change.  HENT:  Positive for congestion and postnasal drip. Negative for nosebleeds, sneezing, sore throat and trouble swallowing.   Eyes:  Negative for itching and visual disturbance.  Respiratory:  Negative for cough.   Cardiovascular:  Negative for chest pain, palpitations and leg  swelling.  Gastrointestinal:  Negative for abdominal distention, blood in stool, diarrhea and nausea.  Genitourinary:  Negative for frequency and hematuria.  Musculoskeletal:  Positive for back pain and gait problem. Negative for joint swelling and neck pain.  Skin:  Negative for rash.  Neurological:  Negative for dizziness, tremors, speech difficulty and weakness.  Psychiatric/Behavioral:  Negative for agitation, dysphoric mood and sleep disturbance. The patient is not nervous/anxious.    Objective:  BP 128/72 (BP Location: Left Arm)   Pulse 98   Temp 98.5 F (36.9 C) (Oral)   Ht '5\' 8"'$  (1.727 m)   Wt 197 lb (89.4 kg)   SpO2 97%   BMI 29.95 kg/m   BP Readings from Last 3 Encounters:  02/28/21 128/72  02/17/21 126/86  12/27/20 130/78    Wt Readings from Last 3 Encounters:  02/28/21 197 lb (89.4 kg)  02/17/21 200 lb (90.7 kg)  01/27/21 200 lb (90.7 kg)    Physical Exam Constitutional:      General: He is not in acute distress.    Appearance: He is well-developed.     Comments: NAD  Eyes:     Conjunctiva/sclera: Conjunctivae normal.     Pupils: Pupils are equal, round, and reactive to light.  Neck:     Thyroid: No thyromegaly.     Vascular: No JVD.  Cardiovascular:     Rate and Rhythm: Normal rate and regular rhythm.  Heart sounds: Normal heart sounds. No murmur heard.   No friction rub. No gallop.  Pulmonary:     Effort: Pulmonary effort is normal. No respiratory distress.     Breath sounds: Normal breath sounds. No wheezing or rales.  Chest:     Chest wall: No tenderness.  Abdominal:     General: Bowel sounds are normal. There is no distension.     Palpations: Abdomen is soft. There is no mass.     Tenderness: There is no abdominal tenderness. There is no guarding or rebound.  Musculoskeletal:        General: Tenderness present. Normal range of motion.     Cervical back: Normal range of motion.  Lymphadenopathy:     Cervical: No cervical adenopathy.   Skin:    General: Skin is warm and dry.     Findings: No rash.  Neurological:     Mental Status: He is alert and oriented to person, place, and time.     Cranial Nerves: No cranial nerve deficit.     Motor: No abnormal muscle tone.     Coordination: Coordination normal.     Gait: Gait normal.     Deep Tendon Reflexes: Reflexes are normal and symmetric.  Psychiatric:        Behavior: Behavior normal.        Thought Content: Thought content normal.        Judgment: Judgment normal.  LS tender  Lab Results  Component Value Date   WBC 3.8 (L) 02/17/2021   HGB 14.4 02/17/2021   HCT 42.2 02/17/2021   PLT 195.0 02/17/2021   GLUCOSE 96 02/17/2021   ALT 72 (H) 02/17/2021   AST 36 02/17/2021   NA 137 02/17/2021   K 4.2 02/17/2021   CL 101 02/17/2021   CREATININE 0.88 02/17/2021   BUN 14 02/17/2021   CO2 28 02/17/2021   TSH 1.26 01/14/2020   HGBA1C 5.6 10/10/2020    CT HEAD WO CONTRAST (5MM)  Result Date: 02/28/2021 CLINICAL DATA:  New daily headache.  Vertigo. EXAM: CT HEAD WITHOUT CONTRAST TECHNIQUE: Contiguous axial images were obtained from the base of the skull through the vertex without intravenous contrast. COMPARISON:  None. FINDINGS: Brain: No evidence of acute infarction, hemorrhage, hydrocephalus, extra-axial collection or mass lesion/mass effect. Vascular: No hyperdense vessel or unexpected calcification. Skull: Normal. Negative for fracture or focal lesion. Sinuses/Orbits: No acute finding. IMPRESSION: Negative head CT. Electronically Signed   By: Monte Fantasia M.D.   On: 02/28/2021 06:01    Assessment & Plan:     Walker Kehr, MD

## 2021-03-08 ENCOUNTER — Telehealth: Payer: Self-pay | Admitting: Internal Medicine

## 2021-03-08 NOTE — Telephone Encounter (Signed)
[facility] called   They are out of network with patient's insurance [employer]. Recommended Dr. Kasandra Knudsen Jonathon Bellows Benjamine Mola. Stated they are in network with [facility].   Please advise.

## 2021-03-29 ENCOUNTER — Ambulatory Visit: Payer: 59 | Admitting: Internal Medicine

## 2021-06-01 ENCOUNTER — Encounter: Payer: Self-pay | Admitting: Internal Medicine

## 2021-06-01 ENCOUNTER — Ambulatory Visit (INDEPENDENT_AMBULATORY_CARE_PROVIDER_SITE_OTHER): Payer: 59 | Admitting: Internal Medicine

## 2021-06-01 ENCOUNTER — Other Ambulatory Visit: Payer: Self-pay

## 2021-06-01 DIAGNOSIS — R7989 Other specified abnormal findings of blood chemistry: Secondary | ICD-10-CM | POA: Diagnosis not present

## 2021-06-01 DIAGNOSIS — E559 Vitamin D deficiency, unspecified: Secondary | ICD-10-CM | POA: Diagnosis not present

## 2021-06-01 DIAGNOSIS — R0789 Other chest pain: Secondary | ICD-10-CM

## 2021-06-01 DIAGNOSIS — W19XXXA Unspecified fall, initial encounter: Secondary | ICD-10-CM | POA: Diagnosis not present

## 2021-06-01 DIAGNOSIS — M545 Low back pain, unspecified: Secondary | ICD-10-CM

## 2021-06-01 DIAGNOSIS — G8929 Other chronic pain: Secondary | ICD-10-CM

## 2021-06-01 DIAGNOSIS — R140 Abdominal distension (gaseous): Secondary | ICD-10-CM

## 2021-06-01 LAB — COMPREHENSIVE METABOLIC PANEL
ALT: 41 U/L (ref 0–53)
AST: 18 U/L (ref 0–37)
Albumin: 4.3 g/dL (ref 3.5–5.2)
Alkaline Phosphatase: 90 U/L (ref 39–117)
BUN: 15 mg/dL (ref 6–23)
CO2: 25 mEq/L (ref 19–32)
Calcium: 9.2 mg/dL (ref 8.4–10.5)
Chloride: 107 mEq/L (ref 96–112)
Creatinine, Ser: 0.95 mg/dL (ref 0.40–1.50)
GFR: 96.5 mL/min (ref >60.00)
Glucose, Bld: 122 mg/dL — ABNORMAL HIGH (ref 70–99)
Potassium: 3.7 mEq/L (ref 3.5–5.1)
Sodium: 139 mEq/L (ref 135–145)
Total Bilirubin: 0.3 mg/dL (ref 0.2–1.2)
Total Protein: 6.8 g/dL (ref 6.0–8.3)

## 2021-06-01 MED ORDER — METRONIDAZOLE 500 MG PO TABS: 500.0000 mg | ORAL_TABLET | Freq: Three times a day (TID) | ORAL | 0 refills | Status: DC

## 2021-06-01 MED ORDER — ALIGN 4 MG PO CAPS: 1.0000 | ORAL_CAPSULE | Freq: Every day | ORAL | 1 refills | Status: DC

## 2021-06-01 MED ORDER — OXYCODONE HCL 10 MG PO TABS: 10.0000 mg | ORAL_TABLET | Freq: Three times a day (TID) | ORAL | 0 refills | Status: DC

## 2021-06-01 MED ORDER — TIZANIDINE HCL 2 MG PO TABS: 2.0000 mg | ORAL_TABLET | Freq: Every evening | ORAL | 5 refills | Status: DC | PRN

## 2021-06-01 MED ORDER — ESOMEPRAZOLE MAGNESIUM 40 MG PO CPDR: 40.0000 mg | DELAYED_RELEASE_CAPSULE | Freq: Two times a day (BID) | ORAL | 3 refills | Status: DC

## 2021-06-01 NOTE — Assessment & Plan Note (Signed)
Cont w/Vit D 

## 2021-06-01 NOTE — Assessment & Plan Note (Signed)
Worse Try Flagyl and Align

## 2021-06-01 NOTE — Assessment & Plan Note (Signed)
It started post-cholecystectomy

## 2021-06-01 NOTE — Assessment & Plan Note (Signed)
No relapse 

## 2021-06-01 NOTE — Progress Notes (Addendum)
Subjective:  Patient ID: Adam Vaughn, male    DOB: February 10, 1976  Age: 45 y.o. MRN: [id number]  CC: Follow-up (3 MONTH F/U)   HPI LAVON BOTHWELL presents for LBP - failed stimulator trial; gait disorder, knee OA C/o gas  Outpatient Medications Prior to Visit  Medication Sig Dispense Refill   Cholecalciferol (VITAMIN D3) 50 MCG (2000 UT) capsule Take 1 capsule (2,000 Units total) by mouth daily. 100 capsule 3   Cyanocobalamin (VITAMIN B-12) 1000 MCG SUBL Place 1 tablet (1,000 mcg total) under the tongue daily. 100 tablet 3   fluticasone (FLONASE) 50 MCG/ACT nasal spray Place 2 sprays into both nostrils daily. 16 g 6   naproxen (NAPROSYN) 500 MG tablet Take 1 tablet (500 mg total) by mouth 2 (two) times daily as needed for moderate pain. 60 tablet 3   oxymetazoline (AFRIN) 0.05 % nasal spray Place 1 spray into both nostrils at bedtime.     predniSONE (DELTASONE) 10 MG tablet Take 40 mg by mouth daily. TAKE FOUR TABLET BY MOUTH DAILY     pregabalin (LYRICA) 75 MG capsule Take 75 mg by mouth daily.     esomeprazole (NEXIUM) 40 MG capsule Take 1 capsule (40 mg total) by mouth 2 (two) times daily before a meal. 180 capsule 3   tiZANidine (ZANAFLEX) 2 MG tablet Take 1-2 tablets (2-4 mg total) by mouth at bedtime as needed for muscle spasms. 60 tablet 5   doxycycline (VIBRA-TABS) 100 MG tablet Take 1 tablet (100 mg total) by mouth 2 (two) times daily. 20 tablet 0   No facility-administered medications prior to visit.    ROS: Review of Systems  Constitutional:  Negative for appetite change, fatigue and unexpected weight change.  HENT:  Negative for congestion, nosebleeds, sneezing, sore throat and trouble swallowing.   Eyes:  Negative for itching and visual disturbance.  Respiratory:  Negative for cough.   Cardiovascular:  Positive for chest pain. Negative for palpitations and leg swelling.  Gastrointestinal:  Negative for abdominal distention, blood in stool, diarrhea and nausea.   Genitourinary:  Negative for frequency and hematuria.  Musculoskeletal:  Positive for arthralgias, back pain and gait problem. Negative for joint swelling and neck pain.  Skin:  Negative for rash.  Neurological:  Negative for dizziness, tremors, speech difficulty and weakness.  Psychiatric/Behavioral:  Negative for agitation, dysphoric mood, sleep disturbance and suicidal ideas. The patient is not nervous/anxious.    Objective:  BP 130/78 (BP Location: Left Arm)   Pulse 86   Temp 98 F (36.7 C) (Oral)   Ht 5\' 8"  (1.727 m)   Wt 203 lb 9.6 oz (92.4 kg)   SpO2 98%   BMI 30.96 kg/m   BP Readings from Last 3 Encounters:  06/01/21 130/78  02/28/21 128/72  02/17/21 126/86    Wt Readings from Last 3 Encounters:  06/01/21 203 lb 9.6 oz (92.4 kg)  02/28/21 197 lb (89.4 kg)  02/17/21 200 lb (90.7 kg)    Physical Exam Constitutional:      General: He is not in acute distress.    Appearance: He is well-developed.     Comments: NAD  Eyes:     Conjunctiva/sclera: Conjunctivae normal.     Pupils: Pupils are equal, round, and reactive to light.  Neck:     Thyroid: No thyromegaly.     Vascular: No JVD.  Cardiovascular:     Rate and Rhythm: Normal rate and regular rhythm.     Heart sounds: Normal heart  sounds. No murmur heard.   No friction rub. No gallop.  Pulmonary:     Effort: Pulmonary effort is normal. No respiratory distress.     Breath sounds: Normal breath sounds. No wheezing or rales.  Chest:     Chest wall: No tenderness.  Abdominal:     General: Bowel sounds are normal. There is no distension.     Palpations: Abdomen is soft. There is no mass.     Tenderness: There is no abdominal tenderness. There is no guarding or rebound.  Musculoskeletal:        General: Tenderness present. Normal range of motion.     Cervical back: Normal range of motion.  Lymphadenopathy:     Cervical: No cervical adenopathy.  Skin:    General: Skin is warm and dry.     Findings: No rash.   Neurological:     Mental Status: He is alert and oriented to person, place, and time.     Cranial Nerves: No cranial nerve deficit.     Motor: Weakness present. No abnormal muscle tone.     Coordination: Coordination normal.     Gait: Gait abnormal.     Deep Tendon Reflexes: Reflexes are normal and symmetric.  Psychiatric:        Behavior: Behavior normal.        Thought Content: Thought content normal.        Judgment: Judgment normal.   Antalgic gait LS w/pain Lab Results  Component Value Date   WBC 3.8 (L) 02/17/2021   HGB 14.4 02/17/2021   HCT 42.2 02/17/2021   PLT 195.0 02/17/2021   GLUCOSE 96 02/17/2021   ALT 72 (H) 02/17/2021   AST 36 02/17/2021   NA 137 02/17/2021   K 4.2 02/17/2021   CL 101 02/17/2021   CREATININE 0.88 02/17/2021   BUN 14 02/17/2021   CO2 28 02/17/2021   TSH 1.26 01/14/2020   HGBA1C 5.6 10/10/2020    CT HEAD WO CONTRAST (5MM)  Result Date: 02/28/2021 CLINICAL DATA:  New daily headache.  Vertigo. EXAM: CT HEAD WITHOUT CONTRAST TECHNIQUE: Contiguous axial images were obtained from the base of the skull through the vertex without intravenous contrast. COMPARISON:  None. FINDINGS: Brain: No evidence of acute infarction, hemorrhage, hydrocephalus, extra-axial collection or mass lesion/mass effect. Vascular: No hyperdense vessel or unexpected calcification. Skull: Normal. Negative for fracture or focal lesion. Sinuses/Orbits: No acute finding. IMPRESSION: Negative head CT. Electronically Signed   By: Monte Fantasia M.D.   On: 02/28/2021 06:01    Assessment & Plan:   Problem List Items Addressed This Visit     Chest pain, atypical    It started post-cholecystectomy      Falls    No relapse      Low back pain     Pt failed stimulator trial; gait disorder, knee OA On Oxy 10 mg tid per [facility]       Relevant Medications   predniSONE (DELTASONE) 10 MG tablet   Oxycodone HCl 10 MG TABS   tiZANidine (ZANAFLEX) 2 MG tablet   Meteorism     Worse Try Flagyl and Align      Vitamin D deficiency    Cont w/Vit D      Other Visit Diagnoses     Elevated LFTs             Meds ordered this encounter  Medications   Oxycodone HCl 10 MG TABS    Sig: Take 1 tablet (10  mg total) by mouth 3 (three) times daily.    Dispense:  90 tablet    Refill:  0    Per Pain Clinic   esomeprazole (NEXIUM) 40 MG capsule    Sig: Take 1 capsule (40 mg total) by mouth 2 (two) times daily before a meal.    Dispense:  180 capsule    Refill:  3   tiZANidine (ZANAFLEX) 2 MG tablet    Sig: Take 1-2 tablets (2-4 mg total) by mouth at bedtime as needed for muscle spasms.    Dispense:  60 tablet    Refill:  5   metroNIDAZOLE (FLAGYL) 500 MG tablet    Sig: Take 1 tablet (500 mg total) by mouth 3 (three) times daily.    Dispense:  30 tablet    Refill:  0   Probiotic Product (ALIGN) 4 MG CAPS    Sig: Take 1 capsule (4 mg total) by mouth daily.    Dispense:  30 capsule    Refill:  1      Follow-up: Return in about 3 months (around 08/30/2021) for a follow-up visit.  Walker Kehr, MD

## 2021-06-01 NOTE — Assessment & Plan Note (Addendum)
Pt failed stimulator trial; gait disorder, knee OA On Oxy 10 mg tid per [facility]

## 2021-06-02 LAB — HEPATITIS C ANTIBODY
Hepatitis C Ab: NONREACTIVE
SIGNAL TO CUT-OFF: 0.04 (ref <1.00)

## 2021-06-25 ENCOUNTER — Emergency Department (HOSPITAL_COMMUNITY)
Admission: EM | Admit: 2021-06-25 | Discharge: 2021-06-25 | Disposition: A | Discharge status: Home / Self Care | Payer: 59 | Attending: Emergency Medicine | Admitting: Emergency Medicine

## 2021-06-25 ENCOUNTER — Other Ambulatory Visit: Payer: Self-pay

## 2021-06-25 ENCOUNTER — Emergency Department (HOSPITAL_COMMUNITY): Payer: 59

## 2021-06-25 DIAGNOSIS — Z79899 Other long term (current) drug therapy: Secondary | ICD-10-CM | POA: Diagnosis not present

## 2021-06-25 DIAGNOSIS — Z7982 Long term (current) use of aspirin: Secondary | ICD-10-CM | POA: Diagnosis not present

## 2021-06-25 DIAGNOSIS — M25511 Pain in right shoulder: Secondary | ICD-10-CM

## 2021-06-25 MED ORDER — METHOCARBAMOL 500 MG PO TABS: 500.0000 mg | ORAL_TABLET | Freq: Two times a day (BID) | ORAL | 0 refills | Status: DC

## 2021-06-25 MED ORDER — HYDROMORPHONE HCL 1 MG/ML IJ SOLN
0.5000 mg | Freq: Once | INTRAMUSCULAR | Status: AC
  Administered 2021-06-25: 08:00:00 0.5 mg via INTRAMUSCULAR
  Filled 2021-06-25: qty 1

## 2021-06-25 MED ORDER — METHOCARBAMOL 500 MG PO TABS
1000.0000 mg | ORAL_TABLET | Freq: Once | ORAL | Status: AC
  Administered 2021-06-25: 06:00:00 1000 mg via ORAL
  Filled 2021-06-25: qty 2

## 2021-06-25 NOTE — Discharge Instructions (Addendum)
You are seen in today for evaluation of right shoulder pain.  Your imaging was clear.  This is likely musculoskeletal in nature.  We have given you a dose of pain medication here, but continue to take your at home already prescribed pain medication at home.  Additionally, you can add ibuprofen 600 mg with these doses.  Please make sure to eat food before hand.  Additionally, I prescribed you Robaxin, muscle relaxer to take as needed for symptoms.  Please do not use this in combination with your tizanidine, but she can use it in place of your tizanidine.  If you have any new or worsening symptoms as discussed such as weakness, numbness, tingling, color changes, swelling, please return to the nearest emergency department for evaluation.

## 2021-06-25 NOTE — ED Triage Notes (Signed)
Pt reported to ED with c/o pain to right shoulder after mechanical fall last night. Pt unable to lift extremity.

## 2021-06-25 NOTE — ED Notes (Signed)
Pt verbalizes understanding of discharge instructions. Opportunity for questions and answers were provided. Pt discharged from the ED.   ?

## 2021-06-25 NOTE — ED Provider Notes (Signed)
[facility] EMERGENCY DEPARTMENT Provider Note   CSN: [id number] Arrival date & time: 06/25/21  0447     History Chief Complaint  Patient presents with   Shoulder Pain    Adam Vaughn is a 45 y.o. male [facility] emergency department for evaluation of right shoulder/upper arm pain.  Patient reports he had a mechanical fall around 1800, denies any head injury or neck pain.  Patient reports he would "pull himself up with his right arm when he felt a sharp pain in his right upper arm.  He denies any numbness or tingling.  Denies any weakness.  Reports sharp pain.  Denies any medical history.  Surgical history for multiple back surgeries.  Takes 30 mg of oxycodone a day, tizanidine, and Nexium.  Allergic to gabapentin and Lyrica.  Denies any tobacco, EtOH, or drug use.   Shoulder Pain Associated symptoms: no back pain (Other than baseline) and no neck pain       Past Medical History:  Diagnosis Date   Arthritis    GERD (gastroesophageal reflux disease)    History of kidney stones    Hypertension    Kidney stones     Patient Active Problem List   Diagnosis Date Noted   Meteorism 06/01/2021   Sinus congestion 02/28/2021   Vertigo 02/17/2021   Headache 02/17/2021   Fever 02/17/2021   Falls 02/17/2021   Allergic rhinitis 10/25/2020   Superficial phlebitis 10/25/2020   Family history of acute polio 10/25/2020   Chest pain, atypical 10/10/2020   S/P laparoscopic cholecystectomy 10/10/2020   Hyperglycemia 10/10/2020   Acute cholecystitis 09/30/2020   Insomnia 09/26/2020   Knee pain 06/23/2020   Low vitamin B12 level 05/31/2020   Ankle sprain 05/31/2020   Constipation by delayed colonic transit 05/31/2020   Degenerative spondylolisthesis 04/11/2020   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   DDD (degenerative disc disease), lumbar 01/20/2020   Left lumbar radiculitis 01/20/2020   Vitamin D deficiency 01/15/2020   Low  back pain 01/12/2020   IBS (irritable bowel syndrome) 01/12/2020   Nephrolithiasis 01/12/2020   Erectile dysfunction 01/12/2020   GERD (gastroesophageal reflux disease) 01/12/2020    Past Surgical History:  Procedure Laterality Date   BACK SURGERY  04/11/2020   CHOLECYSTECTOMY N/A 10/01/2020   Procedure: LAPAROSCOPIC CHOLECYSTECTOMY;  Surgeon: Jovita Kussmaul, MD;  Location: [facility] ORS;  Service: General;  Laterality: N/A;   COLONOSCOPY     HAND SURGERY Bilateral    TONSILLECTOMY     VASECTOMY         Family History  Problem Relation Age of Onset   Hypertension Father    Heart attack Father    Other Father        Small intestine removed but does not know why   Diabetes Sister    Heart disease Sister    Other Brother        MVA   Colon polyps Sister    Healthy Sister    Healthy Sister    Healthy Brother    Healthy Brother    Healthy Brother    Healthy Brother    Healthy Brother    Healthy Brother    Healthy Sister    Liver disease Neg Hx    Pancreatic cancer Neg Hx    Esophageal cancer Neg Hx    Colon cancer Neg Hx    Stomach cancer Neg Hx     Social History   Tobacco Use  Smoking status: Never   Smokeless tobacco: Never  Vaping Use   Vaping Use: Never used  Substance Use Topics   Alcohol use: Yes    Comment: occ   Drug use: Never    Home Medications Prior to Admission medications   Medication Sig Start Date End Date Taking? Authorizing Provider  Aspirin-Acetaminophen (GOODYS BACK & BODY PAIN PO) Take 1 packet by mouth daily as needed (pain).   Yes [provider]  B Complex Vitamins (VITAMIN B COMPLEX PO) Take 1 tablet by mouth daily.   Yes [provider]  esomeprazole (NEXIUM) 40 MG capsule Take 1 capsule (40 mg total) by mouth 2 (two) times daily before a meal. 06/01/21 06/01/22 Yes Plotnikov, Evie Lacks, MD  fluticasone (FLONASE) 50 MCG/ACT nasal spray Place 2 sprays into both nostrils daily. 09/26/20  Yes Plotnikov, Evie Lacks, MD   ibuprofen (ADVIL) 200 MG tablet Take 800 mg by mouth every 6 (six) hours as needed for headache or moderate pain.   Yes [provider]  loratadine (ALLERGY) 10 MG tablet Take 10 mg by mouth daily as needed for allergies.   Yes [provider]  methocarbamol (ROBAXIN) 500 MG tablet Take 1 tablet (500 mg total) by mouth 2 (two) times daily. 06/25/21  Yes Sherrell Puller, PA-C  naproxen (NAPROSYN) 500 MG tablet Take 1 tablet (500 mg total) by mouth 2 (two) times daily as needed for moderate pain. 09/26/20  Yes Plotnikov, Evie Lacks, MD  ondansetron (ZOFRAN-ODT) 4 MG disintegrating tablet Take 4 mg by mouth every 8 (eight) hours as needed for nausea or vomiting.   Yes [provider]  Oxycodone HCl 10 MG TABS Take 1 tablet (10 mg total) by mouth 3 (three) times daily. 06/01/21  Yes Plotnikov, Evie Lacks, MD  oxymetazoline (AFRIN) 0.05 % nasal spray Place 1 spray into both nostrils daily.   Yes [provider]  pregabalin (LYRICA) 150 MG capsule Take 150 mg by mouth 2 (two) times daily. 04/21/21  Yes [provider]  tiZANidine (ZANAFLEX) 2 MG tablet Take 1-2 tablets (2-4 mg total) by mouth at bedtime as needed for muscle spasms. 06/01/21  Yes Plotnikov, Evie Lacks, MD  Cholecalciferol (VITAMIN D3) 50 MCG (2000 UT) capsule Take 1 capsule (2,000 Units total) by mouth daily. Patient not taking: Reported on 06/25/2021 05/31/20   Plotnikov, Evie Lacks, MD  Cyanocobalamin (VITAMIN B-12) 1000 MCG SUBL Place 1 tablet (1,000 mcg total) under the tongue daily. Patient not taking: Reported on 06/25/2021 05/31/20   Plotnikov, Evie Lacks, MD  metroNIDAZOLE (FLAGYL) 500 MG tablet Take 1 tablet (500 mg total) by mouth 3 (three) times daily. Patient not taking: Reported on 06/25/2021 06/01/21   Plotnikov, Evie Lacks, MD  predniSONE (DELTASONE) 10 MG tablet Take 40 mg by mouth daily. TAKE FOUR TABLET BY MOUTH DAILY Patient not taking: Reported on 06/25/2021 05/09/21   [provider]  Probiotic Product (ALIGN) 4 MG CAPS Take 1 capsule (4 mg total) by mouth daily. Patient not taking: Reported on 06/25/2021 06/01/21   Plotnikov, Evie Lacks, MD    Allergies    Gabapentin and Lyrica cr [pregabalin er]  Review of Systems   Review of Systems  Musculoskeletal:  Positive for myalgias. Negative for back pain (Other than baseline), joint swelling and neck pain.  Neurological:  Negative for syncope, weakness and numbness.  All other systems reviewed and are negative.  Physical Exam Updated Vital Signs BP 115/68    Pulse 75  Temp 97.8 F (36.6 C) (Oral)    Resp 18    Ht 5\' 8"  (1.727 m)    Wt 90.7 kg    SpO2 97%    BMI 30.41 kg/m   Physical Exam Constitutional:      General: He is not in acute distress.    Appearance: Normal appearance. He is normal weight. He is not toxic-appearing.  HENT:     Head: Normocephalic and atraumatic.  Eyes:     General: No scleral icterus. Cardiovascular:     Rate and Rhythm: Normal rate and regular rhythm.  Pulmonary:     Effort: Pulmonary effort is normal.     Breath sounds: Normal breath sounds.  Abdominal:     General: Abdomen is flat. Bowel sounds are normal.     Palpations: Abdomen is soft.  Musculoskeletal:        General: Tenderness present. No swelling or deformity.     Cervical back: Normal range of motion.     Comments: No overlying skin changes noted.  No erythema, edema, ecchymosis, rash, or abrasion noted.  Patient is tender to touch to his right upper arm.  Nontender to the shoulder joint.  No tenderness to the clavicle or scapula.  No obvious deformity or step-off noted.  Shoulder appears to be in socket.  No signs of dislocation.  Patient is able to flex and extend his wrist and wiggle his fingers with AB and abduction of his hand and fingers.  He has limited range of motion secondary to pain.  Denies any weakness.  Sensation intact.  Compartments soft.  Cap refill less than 3 seconds.  Radial pulses intact.   Skin:    General: Skin is warm and dry.  Neurological:     General: No focal deficit present.     Mental Status: He is alert. Mental status is at baseline.    ED Results / Procedures / Treatments   Labs (all labs ordered are listed, but only abnormal results are displayed) Labs Reviewed - No data to display  EKG None  Radiology DG Cervical Spine Complete  Result Date: 06/25/2021 CLINICAL DATA:  Fall, shoulder pain EXAM: CERVICAL SPINE - COMPLETE 4+ VIEW COMPARISON:  None. FINDINGS: There is no evidence of cervical spine fracture or prevertebral soft tissue swelling. Alignment is normal. No other significant bone abnormalities are identified. IMPRESSION: Negative cervical spine radiographs. Electronically Signed   By: Rolm Baptise M.D.   On: 06/25/2021 06:19   DG Shoulder Right  Result Date: 06/25/2021 CLINICAL DATA:  Fall, right shoulder pain EXAM: RIGHT SHOULDER - 2+ VIEW COMPARISON:  None. FINDINGS: There is no evidence of fracture or dislocation. There is no evidence of arthropathy or other focal bone abnormality. Soft tissues are unremarkable. IMPRESSION: Negative. Electronically Signed   By: Rolm Baptise M.D.   On: 06/25/2021 06:18   DG Humerus Right  Result Date: 06/25/2021 CLINICAL DATA:  Fall, pain EXAM: RIGHT HUMERUS - 2+ VIEW COMPARISON:  None. FINDINGS: There is no evidence of fracture or other focal bone lesions. Soft tissues are unremarkable. IMPRESSION: Negative. Electronically Signed   By: Rolm Baptise M.D.   On: 06/25/2021 06:18    Procedures Procedures   Medications Ordered in ED Medications  methocarbamol (ROBAXIN) tablet 1,000 mg (1,000 mg Oral Given 06/25/21 9379)  HYDROmorphone (DILAUDID) injection 0.5 mg (0.5 mg Intramuscular Given 06/25/21 0240)    ED Course  I have reviewed the triage vital signs and the nursing notes.  Pertinent labs &  imaging results that were available during my care of the patient were reviewed by me and considered in my  medical decision making (see chart for details).  45 year old male presents emerged department for evaluation of right shoulder pain after pulling himself up yesterday.  Differential diagnosis includes but is not limited to rotator cuff injury, AC separation, dislocation, fracture, MSK.  Imaging of the patient's C-spine, right shoulder, and right humerus are negative.  No signs of dislocation or acute fracture.  On physical exam, the patient has tenderness to his right upper arm near the deltoid insertion part.  Patient's range of motion is limited secondary to pain, but he is able to wiggle his fingers and flex and extend his wrist.  Sensations intact.  Cap refill less than 3 seconds.  Compartments are soft.  With negative imaging and feel exam findings, this is likely MSK or muscular/ligamentous injury.  We will give patient dose of pain medication here and send him home on muscle relaxers as he already has pain medication at home.  He reports his wife will be coming to pick him up.  We will refer him to orthopedic for reevaluation.  I discussed the plan and imaging results with the patient.  Patient agrees to plan patient is stable being discharged home in good condition.    MDM Rules/Calculators/A&P                           Final Clinical Impression(s) / ED Diagnoses Final diagnoses:  Acute pain of right shoulder    Rx / DC Orders ED Discharge Orders          Ordered    methocarbamol (ROBAXIN) 500 MG tablet  2 times daily        06/25/21 0812             Sherrell Puller, PA-C 06/25/21 0813    Dorie Rank, MD 06/25/21 [phone]

## 2021-06-25 NOTE — ED Provider Notes (Signed)
Emergency Medicine Provider Triage Evaluation Note  Adam Vaughn , a 45 y.o. male  was evaluated in triage.  Pt complains of acute onset R shoulder pain.  Pt reports he fell onto the shoulder yesterday and has had pain with movement since that time.  Also reports right sided neck pain, but denies numbness in the right arm. Pt reports severe pain when using the right arm, but thinks it might be weak. Pt reports he has been taking oxycodone 10mg  and naprosyn without relief. Last dose at 4am and previously dose was 6pm.  Pt reports   Review of Systems  Positive: Right shoulder pain Negative: numbness  Physical Exam  BP 124/83 (BP Location: Left Arm)    Pulse 86    Temp 97.8 F (36.6 C) (Oral)    Resp 18    Ht 5\' 8"  (1.727 m)    Wt 90.7 kg    SpO2 97%    BMI 30.41 kg/m   Gen:   Awake, no distress   Resp:  Normal effort  MSK:   Decreased ROM of the right shoulder Other:  TTP along the R humerus   Medical Decision Making  Medically screening exam initiated at 5:48 AM.  Appropriate orders placed.  Adam Vaughn was informed that the remainder of the evaluation will be completed by another provider, this initial triage assessment does not replace that evaluation, and the importance of remaining in the ED until their evaluation is complete.  Right shoulder injury.   Torie Towle, Gwenlyn Perking 06/25/21 0551    Quintella Reichert, MD 06/26/21 [phone]

## 2021-06-29 ENCOUNTER — Ambulatory Visit (INDEPENDENT_AMBULATORY_CARE_PROVIDER_SITE_OTHER): Payer: 59 | Admitting: Orthopaedic Surgery

## 2021-06-29 ENCOUNTER — Encounter: Payer: Self-pay | Admitting: Orthopaedic Surgery

## 2021-06-29 ENCOUNTER — Other Ambulatory Visit: Payer: Self-pay

## 2021-06-29 DIAGNOSIS — M25511 Pain in right shoulder: Secondary | ICD-10-CM | POA: Diagnosis not present

## 2021-06-29 NOTE — Progress Notes (Signed)
Office Visit Note   Patient: Adam Vaughn           Date of Birth: 03/20/1976           MRN: [id number] Visit Date: 06/29/2021              Requested by: Cassandria Anger, MD [place],  [place] [id number] PCP: Plotnikov, Evie Lacks, MD   Assessment & Plan: Visit Diagnoses:  1. Acute pain of right shoulder     Plan: Adam Vaughn fell on Christmas Eve and as he was attempting to pull himself up felt a "pop" in his right shoulder.  Since that time he has had significant difficulty raising his arm over his head.  He was seen on Christmas Day in the emergency room with negative x-rays.  They placed him in a sling.  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  He notes he is feeling a little bit better but still wears the sling and having difficulty raising his arm over his head.  Neurologically intact.  I did review his films and were negative for any obvious abnormalities.  He has had little bit of popping clicking and certainly could have a labral tear or possibly even a rotator cuff tear.  I like to reevaluate him in a week and hope that he will continue to improve.  In the meantime he will continue with the sling.  At some point we may want to consider an MRI arthrogram if there is no improvement  Follow-Up Instructions: Return in about 1 week (around 07/06/2021).   Orders:  No orders of the defined types were placed in this encounter.  No orders of the defined types were placed in this encounter.     Procedures: No procedures performed   Clinical Data: No additional findings.   Subjective: Chief Complaint  Patient presents with   Right Shoulder - Pain  Adam Vaughn is 45 years old and relates falling on Christmas eve.  He does have a chronic back issue and has been on oxycodone 30 mg a day.  He is not sure if he lost his balance but trying to pull himself up he felt a pop in his right shoulder.  With  persistent pain he was seen in the emergency room on Christmas Day.  Films were negative.  He was placed in a sling and asked to follow-up with an orthopedist.  He has not had any numbness or tingling but still having difficulty raising his arm over his head.  He is feeling a little bit better.  No related neck pain.  He does not work and has not since his back surgery last year  HPI  Review of Systems   Objective: Vital Signs: There were no vitals taken for this visit.  Physical Exam Constitutional:      Appearance: He is well-developed.  Eyes:     Pupils: Pupils are equal, round, and reactive to light.  Pulmonary:     Effort: Pulmonary effort is normal.  Skin:    General: Skin is warm and dry.  Neurological:     Mental Status: He is alert and oriented to person, place, and time.  Psychiatric:        Behavior: Behavior normal.    Ortho Exam awake alert and oriented x3.  Comfortable sitting right shoulder is in a sling.  Skin is intact.  No ecchymosis.  Good grip and release.  Painful abduction along the anterior lateral subacromial region at about 80 degrees in the same with about 90 degrees of passive flexion.  No pain over the [facility] joint.  Biceps intact.  Seems to have reasonable strength with internal and external rotation  Specialty Comments:  No specialty comments available.  Imaging: No results found.   PMFS History: Patient Active Problem List   Diagnosis Date Noted   Pain in right shoulder 06/29/2021   Meteorism 06/01/2021   Sinus congestion 02/28/2021   Vertigo 02/17/2021   Headache 02/17/2021   Fever 02/17/2021   Falls 02/17/2021   Allergic rhinitis 10/25/2020   Superficial phlebitis 10/25/2020   Family history of acute polio 10/25/2020   Chest pain, atypical 10/10/2020   S/P laparoscopic cholecystectomy 10/10/2020   Hyperglycemia 10/10/2020   Acute cholecystitis 09/30/2020   Insomnia 09/26/2020   Knee pain 06/23/2020   Low vitamin B12 level 05/31/2020    Ankle sprain 05/31/2020   Constipation by delayed colonic transit 05/31/2020   Degenerative spondylolisthesis 04/11/2020   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   DDD (degenerative disc disease), lumbar 01/20/2020   Left lumbar radiculitis 01/20/2020   Vitamin D deficiency 01/15/2020   Low back pain 01/12/2020   IBS (irritable bowel syndrome) 01/12/2020   Nephrolithiasis 01/12/2020   Erectile dysfunction 01/12/2020   GERD (gastroesophageal reflux disease) 01/12/2020   Past Medical History:  Diagnosis Date   Arthritis    GERD (gastroesophageal reflux disease)    History of kidney stones    Hypertension    Kidney stones     Family History  Problem Relation Age of Onset   Hypertension Father    Heart attack Father    Other Father        Small intestine removed but does not know why   Diabetes Sister    Heart disease Sister    Other Brother        MVA   Colon polyps Sister    Healthy Sister    Healthy Sister    Healthy Brother    Healthy Brother    Healthy Brother    Healthy Brother    Healthy Brother    Healthy Brother    Healthy Sister    Liver disease Neg Hx    Pancreatic cancer Neg Hx    Esophageal cancer Neg Hx    Colon cancer Neg Hx    Stomach cancer Neg Hx     Past Surgical History:  Procedure Laterality Date   BACK SURGERY  04/11/2020   CHOLECYSTECTOMY N/A 10/01/2020   Procedure: LAPAROSCOPIC CHOLECYSTECTOMY;  Surgeon: Jovita Kussmaul, MD;  Location: [facility];  Service: General;  Laterality: N/A;   COLONOSCOPY     HAND SURGERY Bilateral    TONSILLECTOMY     VASECTOMY     Social History   Occupational History   Not on file  Tobacco Use   Smoking status: Never   Smokeless tobacco: Never  Vaping Use   Vaping Use: Never used  Substance and Sexual Activity   Alcohol use: Yes    Comment: occ   Drug use: Never   Sexual activity: Not Currently    Birth control/protection: Surgical    Comment: vastectomy     Garald Balding, MD   Note - This record has been created using Editor, commissioning.  Chart creation errors have been sought, but may not always  have been located. Such creation errors do  not reflect on  the standard of medical care.

## 2021-07-06 ENCOUNTER — Ambulatory Visit: Payer: Managed Care, Other (non HMO) | Admitting: Orthopaedic Surgery

## 2021-07-06 ENCOUNTER — Encounter: Payer: Self-pay | Admitting: Orthopaedic Surgery

## 2021-07-06 ENCOUNTER — Other Ambulatory Visit: Payer: Self-pay

## 2021-07-06 DIAGNOSIS — M25511 Pain in right shoulder: Secondary | ICD-10-CM | POA: Diagnosis not present

## 2021-07-06 NOTE — Progress Notes (Signed)
Office Visit Note   Patient: Adam Vaughn           Date of Birth: [birth year]           MRN: [id number] Visit Date: 07/06/2021              Requested by: Cassandria Anger, MD [place],  [place] [id number] PCP: Plotnikov, Evie Lacks, MD   Assessment & Plan: Visit Diagnoses: No diagnosis found.  Plan: Pleasant 46 year old gentleman right-hand-dominant status post injury was seen in emergency room on Christmas to his right shoulder.  He was seen and evaluated a week ago had a painful popping in his anterior shoulder.  Concerns exam for a labrum tear.  He is now been immobilized for a week for reevaluation.  He said he is actually having more popping and has difficulty even lifting light objects.  Points to pain in the anterior shoulder.  He is taking 30 mg of oxycodone he said he does not touch the pain in his shoulder.  He feels like he is getting worse than better.  Had no previous history of shoulder issues MRI.  We will go forward with an right shoulder MRI arthrogram.  He wants to follow-up in the office afterwards.  Follow-Up Instructions: No follow-ups on file.   Orders:  No orders of the defined types were placed in this encounter.  No orders of the defined types were placed in this encounter.     Procedures: No procedures performed   Clinical Data: No additional findings.   Subjective: Chief Complaint  Patient presents with   Right Shoulder - Pain  Patient presents today for a one week follow up on his right shoulder. He said that there has been no improvement. He has noticed that his shoulder pops more. He is wearing his sling. He is taking oxycodone, Tizanidine, Lyrica, and Naproxen for pain relief.   HPI  Review of Systems  All other systems reviewed and are negative.   Objective: Vital Signs: There were no vitals taken for this visit.  Physical Exam Constitutional:      Appearance: Normal appearance.  Eyes:     Extraocular  Movements: Extraocular movements intact.  Skin:    General: Skin is warm and dry.  Neurological:     Mental Status: He is alert.  Psychiatric:        Mood and Affect: Mood normal.        Behavior: Behavior normal.    Ortho Exam Right shoulder he is focally tender over the anterior shoulder labral area.  He has pain and popping with range of motion.  Positive speeds sign.  Quite painful.  Pain with external rotation. Specialty Comments:  No specialty comments available.  Imaging: No results found.   PMFS History: Patient Active Problem List   Diagnosis Date Noted   Pain in right shoulder 06/29/2021   Meteorism 06/01/2021   Sinus congestion 02/28/2021   Vertigo 02/17/2021   Headache 02/17/2021   Fever 02/17/2021   Falls 02/17/2021   Allergic rhinitis 10/25/2020   Superficial phlebitis 10/25/2020   Family history of acute polio 10/25/2020   Chest pain, atypical 10/10/2020   S/P laparoscopic cholecystectomy 10/10/2020   Hyperglycemia 10/10/2020   Acute cholecystitis 09/30/2020   Insomnia 09/26/2020   Knee pain 06/23/2020   Low vitamin B12 level 05/31/2020   Ankle sprain 05/31/2020   Constipation by delayed colonic transit 05/31/2020   Degenerative spondylolisthesis 04/11/2020   Spinal stenosis of  lumbar region with neurogenic claudication 02/14/2020   DDD (degenerative disc disease), lumbar 01/20/2020   Left lumbar radiculitis 01/20/2020   Vitamin D deficiency 01/15/2020   Low back pain 01/12/2020   IBS (irritable bowel syndrome) 01/12/2020   Nephrolithiasis 01/12/2020   Erectile dysfunction 01/12/2020   GERD (gastroesophageal reflux disease) 01/12/2020   Past Medical History:  Diagnosis Date   Arthritis    GERD (gastroesophageal reflux disease)    History of kidney stones    Hypertension    Kidney stones     Family History  Problem Relation Age of Onset   Hypertension Father    Heart attack Father    Other Father        Small intestine removed but does  not know why   Diabetes Sister    Heart disease Sister    Other Brother        MVA   Colon polyps Sister    Healthy Sister    Healthy Sister    Healthy Brother    Healthy Brother    Healthy Brother    Healthy Brother    Healthy Brother    Healthy Brother    Healthy Sister    Liver disease Neg Hx    Pancreatic cancer Neg Hx    Esophageal cancer Neg Hx    Colon cancer Neg Hx    Stomach cancer Neg Hx     Past Surgical History:  Procedure Laterality Date   BACK SURGERY  04/11/2020   CHOLECYSTECTOMY N/A 10/01/2020   Procedure: LAPAROSCOPIC CHOLECYSTECTOMY;  Surgeon: Jovita Kussmaul, MD;  Location: [facility];  Service: General;  Laterality: N/A;   COLONOSCOPY     HAND SURGERY Bilateral    TONSILLECTOMY     VASECTOMY     Social History   Occupational History   Not on file  Tobacco Use   Smoking status: Never   Smokeless tobacco: Never  Vaping Use   Vaping Use: Never used  Substance and Sexual Activity   Alcohol use: Yes    Comment: occ   Drug use: Never   Sexual activity: Not Currently    Birth control/protection: Surgical    Comment: vastectomy

## 2021-07-13 ENCOUNTER — Other Ambulatory Visit: Payer: Self-pay

## 2021-07-13 ENCOUNTER — Encounter: Payer: Self-pay | Admitting: Internal Medicine

## 2021-07-13 ENCOUNTER — Telehealth: Payer: Managed Care, Other (non HMO) | Admitting: Family Medicine

## 2021-07-13 ENCOUNTER — Ambulatory Visit (INDEPENDENT_AMBULATORY_CARE_PROVIDER_SITE_OTHER): Payer: Managed Care, Other (non HMO) | Admitting: Internal Medicine

## 2021-07-13 DIAGNOSIS — G8929 Other chronic pain: Secondary | ICD-10-CM

## 2021-07-13 DIAGNOSIS — M545 Low back pain, unspecified: Secondary | ICD-10-CM | POA: Diagnosis not present

## 2021-07-13 DIAGNOSIS — E559 Vitamin D deficiency, unspecified: Secondary | ICD-10-CM

## 2021-07-13 DIAGNOSIS — M25511 Pain in right shoulder: Secondary | ICD-10-CM | POA: Diagnosis not present

## 2021-07-13 MED ORDER — NAPROXEN 500 MG PO TABS: 500.0000 mg | ORAL_TABLET | Freq: Two times a day (BID) | ORAL | 0 refills | Status: DC | PRN

## 2021-07-13 MED ORDER — OXYCODONE HCL 10 MG PO TABS: 10.0000 mg | ORAL_TABLET | Freq: Three times a day (TID) | ORAL | 0 refills | Status: DC

## 2021-07-13 MED ORDER — OXYCODONE HCL 10 MG PO TABS: 10.0000 mg | ORAL_TABLET | Freq: Four times a day (QID) | ORAL | 0 refills | Status: DC | PRN

## 2021-07-13 MED ORDER — ESOMEPRAZOLE MAGNESIUM 40 MG PO CPDR: 40.0000 mg | DELAYED_RELEASE_CAPSULE | Freq: Two times a day (BID) | ORAL | 3 refills | Status: DC

## 2021-07-13 NOTE — Progress Notes (Signed)
Subjective:  Patient ID: Adam Vaughn, male    DOB: [birth year]  Age: 46 y.o. MRN: [id number]  CC: Back Pain   HPI Adam Vaughn presents for chronic severe LBP -it is too expensive for the patient to continue to go to pain clinic C/o R shoulder pain since 06/25/22 -he is seeing his orthopedist Last UDS 2 mo ago at the pain clinic   Outpatient Medications Prior to Visit  Medication Sig Dispense Refill   Aspirin-Acetaminophen (GOODYS BACK & BODY PAIN PO) Take 1 packet by mouth daily as needed (pain).     B Complex Vitamins (VITAMIN B COMPLEX PO) Take 1 tablet by mouth daily.     Cholecalciferol (VITAMIN D3) 50 MCG (2000 UT) capsule Take 1 capsule (2,000 Units total) by mouth daily. 100 capsule 3   Cyanocobalamin (VITAMIN B-12) 1000 MCG SUBL Place 1 tablet (1,000 mcg total) under the tongue daily. 100 tablet 3   fluticasone (FLONASE) 50 MCG/ACT nasal spray Place 2 sprays into both nostrils daily. 16 g 6   ibuprofen (ADVIL) 200 MG tablet Take 800 mg by mouth every 6 (six) hours as needed for headache or moderate pain.     loratadine (CLARITIN) 10 MG tablet Take 10 mg by mouth daily as needed for allergies.     methocarbamol (ROBAXIN) 500 MG tablet Take 1 tablet (500 mg total) by mouth 2 (two) times daily. 20 tablet 0   ondansetron (ZOFRAN-ODT) 4 MG disintegrating tablet Take 4 mg by mouth every 8 (eight) hours as needed for nausea or vomiting.     oxymetazoline (AFRIN) 0.05 % nasal spray Place 1 spray into both nostrils daily.     predniSONE (DELTASONE) 10 MG tablet Take 40 mg by mouth daily. TAKE FOUR TABLET BY MOUTH DAILY     pregabalin (LYRICA) 150 MG capsule Take 150 mg by mouth 2 (two) times daily.     Probiotic Product (ALIGN) 4 MG CAPS Take 1 capsule (4 mg total) by mouth daily. 30 capsule 1   tiZANidine (ZANAFLEX) 2 MG tablet Take 1-2 tablets (2-4 mg total) by mouth at bedtime as needed for muscle spasms. 60 tablet 5   esomeprazole (NEXIUM) 40 MG capsule Take 1 capsule  (40 mg total) by mouth 2 (two) times daily before a meal. 180 capsule 3   naproxen (NAPROSYN) 500 MG tablet Take 1 tablet (500 mg total) by mouth 2 (two) times daily as needed for moderate pain. 60 tablet 3   Oxycodone HCl 10 MG TABS Take 1 tablet (10 mg total) by mouth 3 (three) times daily. 90 tablet 0   metroNIDAZOLE (FLAGYL) 500 MG tablet Take 1 tablet (500 mg total) by mouth 3 (three) times daily. (Patient not taking: Reported on 07/13/2021) 30 tablet 0   No facility-administered medications prior to visit.    ROS: Review of Systems  Constitutional:  Negative for appetite change, fatigue and unexpected weight change.  HENT:  Negative for congestion, nosebleeds, sneezing, sore throat and trouble swallowing.   Eyes:  Negative for itching and visual disturbance.  Respiratory:  Negative for cough.   Cardiovascular:  Negative for chest pain, palpitations and leg swelling.  Gastrointestinal:  Negative for abdominal distention, blood in stool, diarrhea and nausea.  Genitourinary:  Negative for frequency and hematuria.  Musculoskeletal:  Positive for arthralgias, back pain and gait problem. Negative for joint swelling and neck pain.  Skin:  Negative for rash.  Neurological:  Negative for dizziness, tremors, speech difficulty and weakness.  Psychiatric/Behavioral:  Negative for agitation, dysphoric mood and sleep disturbance. The patient is not nervous/anxious.    Objective:  BP 118/82 (BP Location: Left Arm)    Pulse 91    Temp 98.9 F (37.2 C) (Oral)    Ht 5\' 8"  (1.727 m)    Wt 206 lb 3.2 oz (93.5 kg)    SpO2 96%    BMI 31.35 kg/m   BP Readings from Last 3 Encounters:  07/13/21 118/82  06/25/21 115/68  06/01/21 130/78    Wt Readings from Last 3 Encounters:  07/13/21 206 lb 3.2 oz (93.5 kg)  06/25/21 200 lb (90.7 kg)  06/01/21 203 lb 9.6 oz (92.4 kg)    Physical Exam Constitutional:      General: He is not in acute distress.    Appearance: He is well-developed. He is obese.      Comments: NAD  Eyes:     Conjunctiva/sclera: Conjunctivae normal.     Pupils: Pupils are equal, round, and reactive to light.  Neck:     Thyroid: No thyromegaly.     Vascular: No JVD.  Cardiovascular:     Rate and Rhythm: Normal rate and regular rhythm.     Heart sounds: Normal heart sounds. No murmur heard.   No friction rub. No gallop.  Pulmonary:     Effort: Pulmonary effort is normal. No respiratory distress.     Breath sounds: Normal breath sounds. No wheezing or rales.  Chest:     Chest wall: No tenderness.  Abdominal:     General: Bowel sounds are normal. There is no distension.     Palpations: Abdomen is soft. There is no mass.     Tenderness: There is no abdominal tenderness. There is no guarding or rebound.  Musculoskeletal:        General: Tenderness present. Normal range of motion.     Cervical back: Normal range of motion.     Right lower leg: No edema.     Left lower leg: No edema.  Lymphadenopathy:     Cervical: No cervical adenopathy.  Skin:    General: Skin is warm and dry.     Findings: No bruising or rash.  Neurological:     Mental Status: He is alert and oriented to person, place, and time.     Cranial Nerves: No cranial nerve deficit.     Motor: Weakness present. No abnormal muscle tone.     Coordination: Coordination abnormal.     Gait: Gait abnormal.     Deep Tendon Reflexes: Reflexes are normal and symmetric.  Psychiatric:        Behavior: Behavior normal.        Thought Content: Thought content normal.        Judgment: Judgment normal.  Antalgic wide-based gait Back brace Bilateral knee braces The patient is unable to get up off the chair with difficulty   Lab Results  Component Value Date   WBC 3.8 (L) 02/17/2021   HGB 14.4 02/17/2021   HCT 42.2 02/17/2021   PLT 195.0 02/17/2021   GLUCOSE 122 (H) 06/01/2021   ALT 41 06/01/2021   AST 18 06/01/2021   NA 139 06/01/2021   K 3.7 06/01/2021   CL 107 06/01/2021   CREATININE 0.95 06/01/2021    BUN 15 06/01/2021   CO2 25 06/01/2021   TSH 1.26 01/14/2020   HGBA1C 5.6 10/10/2020    DG Cervical Spine Complete  Result Date: 06/25/2021 CLINICAL DATA:  Fall, shoulder pain EXAM: CERVICAL  SPINE - COMPLETE 4+ VIEW COMPARISON:  None. FINDINGS: There is no evidence of cervical spine fracture or prevertebral soft tissue swelling. Alignment is normal. No other significant bone abnormalities are identified. IMPRESSION: Negative cervical spine radiographs. Electronically Signed   By: Rolm Baptise M.D.   On: 06/25/2021 06:19   DG Shoulder Right  Result Date: 06/25/2021 CLINICAL DATA:  Fall, right shoulder pain EXAM: RIGHT SHOULDER - 2+ VIEW COMPARISON:  None. FINDINGS: There is no evidence of fracture or dislocation. There is no evidence of arthropathy or other focal bone abnormality. Soft tissues are unremarkable. IMPRESSION: Negative. Electronically Signed   By: Rolm Baptise M.D.   On: 06/25/2021 06:18   DG Humerus Right  Result Date: 06/25/2021 CLINICAL DATA:  Fall, pain EXAM: RIGHT HUMERUS - 2+ VIEW COMPARISON:  None. FINDINGS: There is no evidence of fracture or other focal bone lesions. Soft tissues are unremarkable. IMPRESSION: Negative. Electronically Signed   By: Rolm Baptise M.D.   On: 06/25/2021 06:18    Assessment & Plan:   Problem List Items Addressed This Visit     Low back pain    Chronic severe LBP - it is too expensive for the patient to continue to go to pain clinic.  I will have to take over his pain prescriptions.  He had a UDS screen a couple months ago.  Was signed a pain contract.  Currently on oxycodone 10 mg 3 times daily. Refractory symptoms.  Potential benefits of a long term opioids use as well as potential risks (i.e. addiction risk, apnea etc) and complications (i.e. Somnolence, constipation and others) were explained to the patient and were aknowledged.       Relevant Medications   Oxycodone HCl 10 MG TABS   naproxen (NAPROSYN) 500 MG tablet   Oxycodone  HCl 10 MG TABS   Pain in right shoulder    Traumatic shoulder pain.  MRI pending.  Follow-up with orthopedic surgery      Vitamin D deficiency    Continue with vitamin D         Meds ordered this encounter  Medications   Oxycodone HCl 10 MG TABS    Sig: Take 1 tablet (10 mg total) by mouth 3 (three) times daily.    Dispense:  90 tablet    Refill:  0    Please fill on or after 07/16/21   naproxen (NAPROSYN) 500 MG tablet    Sig: Take 1 tablet (500 mg total) by mouth 2 (two) times daily as needed for moderate pain.    Dispense:  180 tablet    Refill:  0   esomeprazole (NEXIUM) 40 MG capsule    Sig: Take 1 capsule (40 mg total) by mouth 2 (two) times daily before a meal.    Dispense:  180 capsule    Refill:  3   Oxycodone HCl 10 MG TABS    Sig: Take 1 tablet (10 mg total) by mouth every 6 (six) hours as needed.    Dispense:  90 tablet    Refill:  0    Please fill on or after 08/15/21      Follow-up: Return in about 2 months (around 09/10/2021) for a follow-up visit.  Walker Kehr, MD

## 2021-07-13 NOTE — Telephone Encounter (Signed)
Pt wanted gel injections/knees 2022, but had [employer] and could not get approval.  Pt now has [employer], card in system. Requesting we run gel injections with new insurance and advise if they will approve as he is still interested.

## 2021-07-14 NOTE — Telephone Encounter (Signed)
Pt has been ran for [facility] 1/13.

## 2021-07-16 NOTE — Assessment & Plan Note (Signed)
Continue with vitamin D 

## 2021-07-16 NOTE — Assessment & Plan Note (Signed)
Traumatic shoulder pain.  MRI pending.  Follow-up with orthopedic surgery

## 2021-07-16 NOTE — Assessment & Plan Note (Addendum)
Chronic severe LBP - it is too expensive for the patient to continue to go to pain clinic.  I will have to take over his pain prescriptions.  He had a UDS screen a couple months ago.  Was signed a pain contract.  Currently on oxycodone 10 mg 3 times daily. Refractory symptoms.  Potential benefits of a long term opioids use as well as potential risks (i.e. addiction risk, apnea etc) and complications (i.e. Somnolence, constipation and others) were explained to the patient and were aknowledged.

## 2021-07-24 ENCOUNTER — Encounter: Payer: Self-pay | Admitting: Physician Assistant

## 2021-07-24 ENCOUNTER — Ambulatory Visit: Payer: Managed Care, Other (non HMO) | Admitting: Physician Assistant

## 2021-07-24 VITALS — BP 140/80 | HR 93 | Ht 68.0 in | Wt 210.0 lb

## 2021-07-24 DIAGNOSIS — K219 Gastro-esophageal reflux disease without esophagitis: Secondary | ICD-10-CM

## 2021-07-24 DIAGNOSIS — Z1211 Encounter for screening for malignant neoplasm of colon: Secondary | ICD-10-CM

## 2021-07-24 DIAGNOSIS — K589 Irritable bowel syndrome without diarrhea: Secondary | ICD-10-CM | POA: Diagnosis not present

## 2021-07-24 DIAGNOSIS — Z800 Family history of malignant neoplasm of digestive organs: Secondary | ICD-10-CM | POA: Diagnosis not present

## 2021-07-24 MED ORDER — ESOMEPRAZOLE MAGNESIUM 40 MG PO CPDR: 40.0000 mg | DELAYED_RELEASE_CAPSULE | Freq: Every day | ORAL | 3 refills | Status: DC

## 2021-07-24 MED ORDER — NA SULFATE-K SULFATE-MG SULF 17.5-3.13-1.6 GM/177ML PO SOLN: 1.0000 | Freq: Once | ORAL | 0 refills | Status: AC

## 2021-07-24 NOTE — Progress Notes (Signed)
I agree with the above note, plan 

## 2021-07-24 NOTE — Patient Instructions (Signed)
If you are age 46 or older, your body mass index should be between 23-30. Your Body mass index is 31.93 kg/m. If this is out of the aforementioned range listed, please consider follow up with your Primary Care Provider.  If you are age 87 or younger, your body mass index should be between 19-25. Your Body mass index is 31.93 kg/m. If this is out of the aformentioned range listed, please consider follow up with your Primary Care Provider.   ________________________________________________________  The [facility] providers would like to encourage you to use [facility] to communicate with providers for non-urgent requests or questions.  Due to long hold times on the telephone, sending your provider a message by [facility] may be a faster and more efficient way to get a response.  Please allow 48 business hours for a response.  Please remember that this is for non-urgent requests.  _______________________________________________________  We have sent the following medications to your pharmacy for you to pick up at your convenience:  Nexium  Start antireflux regimen Try Gas-X 1-2 with meals for gas Decrease sodas  You have been scheduled for an endoscopy and colonoscopy. Please follow the written instructions given to you at your visit today. Please pick up your prep supplies at the pharmacy within the next 1-3 days. If you use inhalers (even only as needed), please bring them with you on the day of your procedure.

## 2021-07-24 NOTE — Progress Notes (Signed)
Subjective:    Patient ID: Adam Vaughn, male    DOB: [birth year], 46 y.o.   MRN: [id number]  HPI [name] is a pleasant 46 year old white male, who was initially seen in May 2022 by myself and established with Dr. Ardis Hughs at that time.  He comes back in today for follow-up and to schedule procedures.  When he was seen in May he had related long-term issues with chronic GERD dating back many years requiring chronic medication for longer than 10 years.  He has had most benefit with Nexium 40 mg daily.  Occasional breakthrough.  It was felt he should have EGD to rule out Barrett's. He also has family history of colon cancer in his sister diagnosed in her 45s and another sister with colon polyps diagnosed at an early age.  He had related intermittent hematochezia since he had undergone cholecystectomy in April 2022.  He was also to be scheduled for colonoscopy. He says he was recently unable to get Nexium filled by his new insurance company, apparently not covering Nexium and he is unaware what medication they will cover. He continues to have alternating bowel movements, which has been his pattern with hard stools alternating with loose stools, lots of gas on a regular basis.  He has been requiring pain medication for chronic back pain spinal stenosis and says that he had always had loose stools prior to getting on regular pain medication now at times he can go several days without a bowel movement and then may have a random day of having 6-7 bowel movements in a day which will be loose.  He has not been having any rectal bleeding recently. Other medical issues as above include laparoscopic cholecystectomy April 2022, spinal stenosis, degenerative disc disease, and IBS.  Review of Systems Pertinent positive and negative review of systems were noted in the above HPI section.  All other review of systems was otherwise negative.   Outpatient Encounter Medications as of 07/24/2021  Medication Sig    Aspirin-Acetaminophen (GOODYS BACK & BODY PAIN PO) Take 1 packet by mouth daily as needed (pain).   B Complex Vitamins (VITAMIN B COMPLEX PO) Take 1 tablet by mouth daily.   esomeprazole (NEXIUM) 40 MG capsule Take 1 capsule (40 mg total) by mouth 2 (two) times daily before a meal.   esomeprazole (NEXIUM) 40 MG capsule Take 1 capsule (40 mg total) by mouth daily.   fluticasone (FLONASE) 50 MCG/ACT nasal spray Place 2 sprays into both nostrils daily.   loratadine (CLARITIN) 10 MG tablet Take 10 mg by mouth daily as needed for allergies.   Na Sulfate-K Sulfate-Mg Sulf 17.5-3.13-1.6 GM/177ML SOLN Take 1 kit by mouth once for 1 dose.   naproxen (NAPROSYN) 500 MG tablet Take 1 tablet (500 mg total) by mouth 2 (two) times daily as needed for moderate pain.   Oxycodone HCl 10 MG TABS Take 1 tablet (10 mg total) by mouth 3 (three) times daily.   oxymetazoline (AFRIN) 0.05 % nasal spray Place 1 spray into both nostrils daily.   pregabalin (LYRICA) 150 MG capsule Take 150 mg by mouth 2 (two) times daily.   tiZANidine (ZANAFLEX) 2 MG tablet Take 1-2 tablets (2-4 mg total) by mouth at bedtime as needed for muscle spasms.   [DISCONTINUED] Cholecalciferol (VITAMIN D3) 50 MCG (2000 UT) capsule Take 1 capsule (2,000 Units total) by mouth daily. (Patient not taking: Reported on 07/24/2021)   [DISCONTINUED] Cyanocobalamin (VITAMIN B-12) 1000 MCG SUBL Place 1 tablet (1,000 mcg  total) under the tongue daily. (Patient not taking: Reported on 07/24/2021)   [DISCONTINUED] ibuprofen (ADVIL) 200 MG tablet Take 800 mg by mouth every 6 (six) hours as needed for headache or moderate pain. (Patient not taking: Reported on 07/24/2021)   [DISCONTINUED] methocarbamol (ROBAXIN) 500 MG tablet Take 1 tablet (500 mg total) by mouth 2 (two) times daily. (Patient not taking: Reported on 07/24/2021)   [DISCONTINUED] ondansetron (ZOFRAN-ODT) 4 MG disintegrating tablet Take 4 mg by mouth every 8 (eight) hours as needed for nausea or vomiting.  (Patient not taking: Reported on 07/24/2021)   [DISCONTINUED] Oxycodone HCl 10 MG TABS Take 1 tablet (10 mg total) by mouth every 6 (six) hours as needed.   [DISCONTINUED] predniSONE (DELTASONE) 10 MG tablet Take 40 mg by mouth daily. TAKE FOUR TABLET BY MOUTH DAILY   [DISCONTINUED] Probiotic Product (ALIGN) 4 MG CAPS Take 1 capsule (4 mg total) by mouth daily. (Patient not taking: Reported on 07/24/2021)   No facility-administered encounter medications on file as of 07/24/2021.   Allergies  Allergen Reactions   Gabapentin     Twitching muscles   Lyrica Cr [Pregabalin Er]     Pt reports rx made him very irritable and irritated his liver   Patient Active Problem List   Diagnosis Date Noted   Pain in right shoulder 06/29/2021   Meteorism 06/01/2021   Sinus congestion 02/28/2021   Vertigo 02/17/2021   Headache 02/17/2021   Fever 02/17/2021   Falls 02/17/2021   Allergic rhinitis 10/25/2020   Superficial phlebitis 10/25/2020   Family history of acute polio 10/25/2020   Chest pain, atypical 10/10/2020   S/P laparoscopic cholecystectomy 10/10/2020   Hyperglycemia 10/10/2020   Acute cholecystitis 09/30/2020   Insomnia 09/26/2020   Knee pain 06/23/2020   Low vitamin B12 level 05/31/2020   Ankle sprain 05/31/2020   Constipation by delayed colonic transit 05/31/2020   Degenerative spondylolisthesis 04/11/2020   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   DDD (degenerative disc disease), lumbar 01/20/2020   Left lumbar radiculitis 01/20/2020   Vitamin D deficiency 01/15/2020   Low back pain 01/12/2020   IBS (irritable bowel syndrome) 01/12/2020   Nephrolithiasis 01/12/2020   Erectile dysfunction 01/12/2020   GERD (gastroesophageal reflux disease) 01/12/2020   Social History   Socioeconomic History   Marital status: Married    Spouse name: Not on file   Number of children: Not on file   Years of education: Not on file   Highest education level: Not  on file  Occupational History   Not on file  Tobacco Use   Smoking status: Never   Smokeless tobacco: Never  Vaping Use   Vaping Use: Never used  Substance and Sexual Activity   Alcohol use: Yes    Comment: occ   Drug use: Never   Sexual activity: Not Currently    Birth control/protection: Surgical    Comment: vastectomy  Other Topics Concern   Not on file  Social History Narrative   Not on file   Social Determinants of Health   Financial Resource Strain: Not on file  Food Insecurity: Not on file  Transportation Needs: Not on file  Physical Activity: Not on file  Stress: Not on file  Social Connections: Not on file  Intimate Partner Violence: Not on file    Mr. Gomes family history includes Colon cancer in his sister; Colon polyps in his sister; Diabetes in his sister; Healthy in his brother, brother, brother, brother, brother, brother, sister,  sister, and sister; Heart attack in his father; Heart disease in his sister; Hypertension in his father; Other in his brother and father.      Objective:    Vitals:   07/24/21 1420  BP: 140/80  Pulse: 93  SpO2: 95%    Physical Exam Well-developed well-nourished WM  in no acute distress.  Height, Weight,210 BMI 31.9   HEENT; nontraumatic normocephalic, EOMI, PE R LA, sclera anicteric. Oropharynx; not examined Neck; supple, no JVD Cardiovascular; regular rate and rhythm with S1-S2, no murmur rub or gallop Pulmonary; Clear bilaterally Abdomen; soft, no focal tenderness, nondistended, no palpable mass or hepatosplenomegaly, bowel sounds are active Rectal; not done today Skin; benign exam, no jaundice rash or appreciable lesions Extremities; no clubbing cyanosis or edema skin warm and dry Neuro/Psych; alert and oriented x4, grossly nonfocal mood and affect appropriate        Assessment & Plan:   #62 46 year old white male with long-term chronic GERD on chronic PPI therapy greater than 10 years, usually fairly good  control with Nexium 40 mg daily Rule out Barrett's esophagus  #2 status post laparoscopic cholecystectomy April 2022 #3 chronic alternating bowel habits #4 hematochezia-this was mentioned at his initial office visit, has not been an ongoing chronic problem #5 family history of colon cancer in patient's sister diagnosed in her 66s #6 spinal stenosis and degenerative disc disease requiring chronic narcotics  Plan; continue antireflux regimen, antireflux diet, he was also given a copy of low gas diet today Will send new prescription for Nexium 40 mg p.o. every morning to [facility], refill x1 year Trial of Gas-X 1-2 with meals Patient will be scheduled for EGD and colonoscopy with Dr. Ardis Hughs.  Both procedures were discussed in detail with the patient including indications risks and benefits and he is agreeable to proceed.   Horald Birky S Mettie Roylance PA-C 07/24/2021   Cc: Plotnikov, Evie Lacks, MD

## 2021-07-27 ENCOUNTER — Other Ambulatory Visit: Payer: Managed Care, Other (non HMO)

## 2021-07-27 ENCOUNTER — Telehealth: Payer: Self-pay

## 2021-07-27 ENCOUNTER — Inpatient Hospital Stay: Admission: RE | Admit: 2021-07-27 | Payer: Managed Care, Other (non HMO) | Source: Ambulatory Visit

## 2021-07-27 NOTE — Telephone Encounter (Signed)
Left pt a VM informing him of approval of Gelsyn-3 for both knees. If pt returns VM please schedule him for a visit w/ Dr. Georgina Snell to start the gel series.

## 2021-07-27 NOTE — Telephone Encounter (Signed)
Approved bilaterally, scheduled for 07/31/2021.

## 2021-07-27 NOTE — Telephone Encounter (Signed)
Scheduled for 1/30

## 2021-07-31 ENCOUNTER — Ambulatory Visit: Payer: Self-pay

## 2021-07-31 ENCOUNTER — Other Ambulatory Visit: Payer: Self-pay

## 2021-07-31 ENCOUNTER — Ambulatory Visit (INDEPENDENT_AMBULATORY_CARE_PROVIDER_SITE_OTHER): Payer: Managed Care, Other (non HMO) | Admitting: Family Medicine

## 2021-07-31 DIAGNOSIS — M25561 Pain in right knee: Secondary | ICD-10-CM | POA: Diagnosis not present

## 2021-07-31 DIAGNOSIS — M170 Bilateral primary osteoarthritis of knee: Secondary | ICD-10-CM

## 2021-07-31 DIAGNOSIS — M25562 Pain in left knee: Secondary | ICD-10-CM

## 2021-07-31 DIAGNOSIS — G8929 Other chronic pain: Secondary | ICD-10-CM

## 2021-07-31 NOTE — Progress Notes (Signed)
Adam Vaughn presents to clinic today for Gelsyn injection bilateral knees 1/3  Procedure: Real-time Ultrasound Guided Injection of right knee superior lateral patellar space Device: Philips Affiniti 50G Images permanently stored and available for review in PACS Verbal informed consent obtained.  Discussed risks and benefits of procedure. Warned about infection bleeding damage to structures skin hypopigmentation and fat atrophy among others. Patient expresses understanding and agreement Time-out conducted.   Noted no overlying erythema, induration, or other signs of local infection.   Skin prepped in a sterile fashion.   Local anesthesia: Topical Ethyl chloride.   With sterile technique and under real time ultrasound guidance: Gelsyn 16.8 mg injected into knee joint. Fluid seen entering the joint capsule.   Completed without difficulty    Advised to call if fevers/chills, erythema, induration, drainage, or persistent bleeding.   Images permanently stored and available for review in the ultrasound unit.  Impression: Technically successful ultrasound guided injection.    Procedure: Real-time Ultrasound Guided Injection of left knee superior lateral patellar space Device: Philips Affiniti 50G Images permanently stored and available for review in PACS Verbal informed consent obtained.  Discussed risks and benefits of procedure. Warned about infection bleeding damage to structures skin hypopigmentation and fat atrophy among others. Patient expresses understanding and agreement Time-out conducted.   Noted no overlying erythema, induration, or other signs of local infection.   Skin prepped in a sterile fashion.   Local anesthesia: Topical Ethyl chloride.   With sterile technique and under real time ultrasound guidance: Gelsyn 16.8 mg injected into knee joint. Fluid seen entering the joint capsule.   Completed without difficulty    Advised to call if fevers/chills, erythema, induration, drainage, or  persistent bleeding.   Images permanently stored and available for review in the ultrasound unit.  Impression: Technically successful ultrasound guided injection.  Lot number:  [id number]  Return in 1 week for Gelsyn injection bilateral knees 2/3

## 2021-07-31 NOTE — Patient Instructions (Signed)
Good to see you.  You had B knee Gelsyn injections (1/3).  Call or go to the ER if you develop a large red swollen joint with extreme pain or oozing puss.   Please schedule 2 follow-up visits (1/week for the next 2 weeks) for the remaining injections in the series.

## 2021-08-02 ENCOUNTER — Other Ambulatory Visit: Payer: Self-pay

## 2021-08-02 ENCOUNTER — Ambulatory Visit: Payer: Managed Care, Other (non HMO) | Admitting: Orthopaedic Surgery

## 2021-08-02 ENCOUNTER — Encounter: Payer: Self-pay | Admitting: Orthopaedic Surgery

## 2021-08-02 VITALS — Ht 68.0 in | Wt 210.0 lb

## 2021-08-02 DIAGNOSIS — M25511 Pain in right shoulder: Secondary | ICD-10-CM

## 2021-08-02 NOTE — Progress Notes (Signed)
Office Visit Note   Patient: Adam Vaughn           Date of Birth: [birth year]           MRN: [id number] Visit Date: 08/02/2021              Requested by: Adam Anger, MD [place],  [place] [address] PCP: Adam Vaughn, Adam Lacks, MD   Assessment & Plan: Visit Diagnoses:  1. Acute pain of right shoulder     Plan: [name] sustained an injury to his right dominant shoulder on Christmas Eve 2022.  He had fallen and was pulling himself up by holding onto to his truck.  He felt sharp pain in his shoulder while laying up.  He was seen in the emergency room with negative films.  He was placed in a shoulder immobilizer and referred to the office.  He was seen on 5 January and I had suggested an MRI arthrogram as I thought he either had a rotator cuff tear or possibly a anterior labral tear.  Apparently the MRI scan was denied but has been resubmitted.  He continues to have pain to the point of comp and continues to wear the shoulder immobilizer.  He has difficulty sleeping on that side and raising his arm over his head.  By exam he has positive impingement testing and does have a lot of popping and clicking.  I am concerned that he may have an anterior labral tear.  Neurologically intact.  He does have chronic back pain and is on oxycodone daily.  He has not worked in about a year and a half as a result of his chronic back pain and subsequent surgery.  I believe an MRI arthrogram is certainly indicated given his compromise and exam.  We will resubmit the request and I do not think physical therapy will be helpful as she is having difficulty with pain and feel like it would not be very helpful  Follow-Up Instructions: Return After MRI arthrogram right shoulder.   Orders:  No orders of the defined types were placed in this encounter.  No orders of the defined types were placed in this encounter.     Procedures: No procedures performed   Clinical Data: No  additional findings.   Subjective: Chief Complaint  Patient presents with   Right Shoulder - Follow-up  Patient presents today for follow up on his right shoulder. He states that he never had the MRI done due to insurance not approving. He is still wearing a sling. He takes pain medicine through pain management.   HPI  Review of Systems   Objective: Vital Signs: Ht 5\' 8"  (1.727 m)    Wt 210 lb (95.3 kg)    BMI 31.93 kg/m   Physical Exam Constitutional:      Appearance: He is well-developed.  Eyes:     Pupils: Pupils are equal, round, and reactive to light.  Pulmonary:     Effort: Pulmonary effort is normal.  Skin:    General: Skin is warm and dry.  Neurological:     Mental Status: He is alert and oriented to person, place, and time.  Psychiatric:        Behavior: Behavior normal.    Ortho Exam right shoulder in a shoulder immobilizer.  This was carefully removed.  He could slowly move his arm over his head without evidence of adhesive capsulitis.  I did not discern any apprehension.  He has quite  a bit of pain along the anterior and posterior shoulder joint with some popping and clicking.  Positive impingement testing and even some pain along the anterior subacromial region.  No pain at the [facility] joint.  Biceps intact.  Skin intact.  No ecchymosis.  Good grip and release no referred pain to his shoulder with neck motion  Specialty Comments:  No specialty comments available.  Imaging: No results found.   PMFS History: Patient Active Problem List   Diagnosis Date Noted   Pain in right shoulder 06/29/2021   Meteorism 06/01/2021   Sinus congestion 02/28/2021   Vertigo 02/17/2021   Headache 02/17/2021   Fever 02/17/2021   Falls 02/17/2021   Allergic rhinitis 10/25/2020   Superficial phlebitis 10/25/2020   Family history of acute polio 10/25/2020   Chest pain, atypical 10/10/2020   S/P laparoscopic cholecystectomy 10/10/2020   Hyperglycemia 10/10/2020   Acute  cholecystitis 09/30/2020   Insomnia 09/26/2020   Knee pain 06/23/2020   Low vitamin B12 level 05/31/2020   Ankle sprain 05/31/2020   Constipation by delayed colonic transit 05/31/2020   Degenerative spondylolisthesis 04/11/2020   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   DDD (degenerative disc disease), lumbar 01/20/2020   Left lumbar radiculitis 01/20/2020   Vitamin D deficiency 01/15/2020   Low back pain 01/12/2020   IBS (irritable bowel syndrome) 01/12/2020   Nephrolithiasis 01/12/2020   Erectile dysfunction 01/12/2020   GERD (gastroesophageal reflux disease) 01/12/2020   Past Medical History:  Diagnosis Date   Arthritis    Family history of colon cancer    sister   GERD (gastroesophageal reflux disease)    History of kidney stones    Hypertension    Kidney stones     Family History  Problem Relation Age of Onset   Hypertension Father    Heart attack Father    Other Father        Small intestine removed but does not know why   Diabetes Sister    Heart disease Sister    Colon cancer Sister    Colon polyps Sister    Healthy Sister    Healthy Sister    Healthy Sister    Other Brother        MVA   Healthy Brother    Healthy Brother    Healthy Brother    Healthy Brother    Healthy Brother    Healthy Brother    Liver disease Neg Hx    Pancreatic cancer Neg Hx    Esophageal cancer Neg Hx    Stomach cancer Neg Hx     Past Surgical History:  Procedure Laterality Date   BACK SURGERY  04/11/2020   CHOLECYSTECTOMY N/A 10/01/2020   Procedure: LAPAROSCOPIC CHOLECYSTECTOMY;  Surgeon: Adam Kussmaul, MD;  Location: [facility];  Service: General;  Laterality: N/A;   COLONOSCOPY     HAND SURGERY Bilateral    TONSILLECTOMY     VASECTOMY     Social History   Occupational History   Not on file  Tobacco Use   Smoking status: Never   Smokeless tobacco: Never  Vaping Use   Vaping Use: Never used  Substance and Sexual Activity   Alcohol  use: Yes    Comment: occ   Drug use: Never   Sexual activity: Not Currently    Birth control/protection: Surgical    Comment: vastectomy

## 2021-08-05 ENCOUNTER — Encounter: Payer: Self-pay | Admitting: Internal Medicine

## 2021-08-07 ENCOUNTER — Ambulatory Visit: Payer: Self-pay

## 2021-08-07 ENCOUNTER — Ambulatory Visit (INDEPENDENT_AMBULATORY_CARE_PROVIDER_SITE_OTHER): Payer: Managed Care, Other (non HMO) | Admitting: Family Medicine

## 2021-08-07 ENCOUNTER — Other Ambulatory Visit: Payer: Self-pay

## 2021-08-07 DIAGNOSIS — M25561 Pain in right knee: Secondary | ICD-10-CM | POA: Diagnosis not present

## 2021-08-07 DIAGNOSIS — G8929 Other chronic pain: Secondary | ICD-10-CM | POA: Diagnosis not present

## 2021-08-07 DIAGNOSIS — M170 Bilateral primary osteoarthritis of knee: Secondary | ICD-10-CM | POA: Diagnosis not present

## 2021-08-07 DIAGNOSIS — M25562 Pain in left knee: Secondary | ICD-10-CM | POA: Diagnosis not present

## 2021-08-07 MED ORDER — PREGABALIN 150 MG PO CAPS: 150.0000 mg | ORAL_CAPSULE | Freq: Two times a day (BID) | ORAL | 2 refills | Status: DC

## 2021-08-07 NOTE — Patient Instructions (Addendum)
Thank you for coming in today.   You received the 2nd Gelsyn injection in both of your knees today. Seek immediate medical attention if the joint becomes red, extremely painful, or is oozing fluid.   We will see you next week for the 3rd Gelsyn injections

## 2021-08-07 NOTE — Progress Notes (Signed)
Adam Vaughn presents to clinic today for Gelsyn injection bilateral knees 2/3  Procedure: Real-time Ultrasound Guided Injection of right knee superior lateral patellar space Device: Philips Affiniti 50G Images permanently stored and available for review in PACS Verbal informed consent obtained.  Discussed risks and benefits of procedure. Warned about infection bleeding damage to structures skin hypopigmentation and fat atrophy among others. Patient expresses understanding and agreement Time-out conducted.   Noted no overlying erythema, induration, or other signs of local infection.   Skin prepped in a sterile fashion.   Local anesthesia: Topical Ethyl chloride.   With sterile technique and under real time ultrasound guidance: Gelsyn 16.8 mg injected into knee joint. Fluid seen entering the joint capsule.   Completed without difficulty    Advised to call if fevers/chills, erythema, induration, drainage, or persistent bleeding.   Images permanently stored and available for review in the ultrasound unit.  Impression: Technically successful ultrasound guided injection.    Procedure: Real-time Ultrasound Guided Injection of left knee superior lateral patellar space Device: Philips Affiniti 50G Images permanently stored and available for review in PACS Verbal informed consent obtained.  Discussed risks and benefits of procedure. Warned about infection bleeding damage to structures skin hypopigmentation and fat atrophy among others. Patient expresses understanding and agreement Time-out conducted.   Noted no overlying erythema, induration, or other signs of local infection.   Skin prepped in a sterile fashion.   Local anesthesia: Topical Ethyl chloride.   With sterile technique and under real time ultrasound guidance: Gelsyn 16.8 mg injected into knee joint. Fluid seen entering the joint capsule.   Completed without difficulty     Advised to call if fevers/chills, erythema, induration, drainage, or  persistent bleeding.   Images permanently stored and available for review in the ultrasound unit.  Impression: Technically successful ultrasound guided injection.  Lot number: [id number] for both injections

## 2021-08-08 ENCOUNTER — Ambulatory Visit: Payer: Managed Care, Other (non HMO) | Admitting: Orthopaedic Surgery

## 2021-08-10 ENCOUNTER — Other Ambulatory Visit: Payer: Self-pay | Admitting: Internal Medicine

## 2021-08-10 MED ORDER — PREGABALIN 150 MG PO CAPS
150.0000 mg | ORAL_CAPSULE | Freq: Two times a day (BID) | ORAL | 5 refills | Status: DC
Start: 2021-08-10 — End: 2022-03-14

## 2021-08-14 ENCOUNTER — Ambulatory Visit: Payer: Self-pay

## 2021-08-14 ENCOUNTER — Ambulatory Visit
Admission: RE | Admit: 2021-08-14 | Discharge: 2021-08-14 | Disposition: A | Discharge status: Home / Self Care | Payer: Managed Care, Other (non HMO) | Source: Ambulatory Visit | Attending: Orthopaedic Surgery | Admitting: Orthopaedic Surgery

## 2021-08-14 ENCOUNTER — Other Ambulatory Visit: Payer: Self-pay

## 2021-08-14 ENCOUNTER — Ambulatory Visit: Payer: Managed Care, Other (non HMO) | Admitting: Family Medicine

## 2021-08-14 DIAGNOSIS — M25511 Pain in right shoulder: Secondary | ICD-10-CM

## 2021-08-14 DIAGNOSIS — M25562 Pain in left knee: Secondary | ICD-10-CM | POA: Diagnosis not present

## 2021-08-14 DIAGNOSIS — M170 Bilateral primary osteoarthritis of knee: Secondary | ICD-10-CM

## 2021-08-14 DIAGNOSIS — G8929 Other chronic pain: Secondary | ICD-10-CM | POA: Diagnosis not present

## 2021-08-14 DIAGNOSIS — M25561 Pain in right knee: Secondary | ICD-10-CM | POA: Diagnosis not present

## 2021-08-14 MED ORDER — IOPAMIDOL (ISOVUE-M 200) INJECTION 41%
13.0000 mL | Freq: Once | INTRAMUSCULAR | Status: AC
  Administered 2021-08-14: 13 mL via INTRA_ARTICULAR

## 2021-08-14 NOTE — Patient Instructions (Addendum)
Thank you for coming in today.   You received the 3rd and final Gelsyn injection in both of your knees today. Seek immediate medical attention if the joint becomes red, extremely painful, or is oozing fluid.   Recheck back as needed

## 2021-08-14 NOTE — Progress Notes (Signed)
Ramie presents to clinic today for Gelsyn injection bilateral knees 3/3  Procedure: Real-time Ultrasound Guided Injection of right knee superior lateral patellar space Device: Philips Affiniti 50G Images permanently stored and available for review in PACS Verbal informed consent obtained.  Discussed risks and benefits of procedure. Warned about infection bleeding damage to structures skin hypopigmentation and fat atrophy among others. Patient expresses understanding and agreement Time-out conducted.   Noted no overlying erythema, induration, or other signs of local infection.   Skin prepped in a sterile fashion.   Local anesthesia: Topical Ethyl chloride.   With sterile technique and under real time ultrasound guidance: Gelsyn 16.8 mg injected into the knee joint. Fluid seen entering the joint capsule.   Completed without difficulty   Advised to call if fevers/chills, erythema, induration, drainage, or persistent bleeding.   Images permanently stored and available for review in the ultrasound unit.  Impression: Technically successful ultrasound guided injection.    Procedure: Real-time Ultrasound Guided Injection of left knee superior lateral patellar space Device: Philips Affiniti 50G Images permanently stored and available for review in PACS Verbal informed consent obtained.  Discussed risks and benefits of procedure. Warned about infection bleeding damage to structures skin hypopigmentation and fat atrophy among others. Patient expresses understanding and agreement Time-out conducted.   Noted no overlying erythema, induration, or other signs of local infection.   Skin prepped in a sterile fashion.   Local anesthesia: Topical Ethyl chloride.   With sterile technique and under real time ultrasound guidance: Gelsyn 16.8 mg injected into knee joint. Fluid seen entering the joint capsule.   Completed without difficulty    Advised to call if fevers/chills, erythema, induration, drainage,  or persistent bleeding.   Images permanently stored and available for review in the ultrasound unit.  Impression: Technically successful ultrasound guided injection.    Lot number: [id number]  Return as needed

## 2021-08-16 ENCOUNTER — Encounter: Payer: Self-pay | Admitting: Internal Medicine

## 2021-08-24 ENCOUNTER — Other Ambulatory Visit: Payer: Self-pay

## 2021-08-24 ENCOUNTER — Ambulatory Visit: Payer: Managed Care, Other (non HMO) | Admitting: Orthopaedic Surgery

## 2021-08-24 ENCOUNTER — Encounter: Payer: Self-pay | Admitting: Orthopaedic Surgery

## 2021-08-24 DIAGNOSIS — M25511 Pain in right shoulder: Secondary | ICD-10-CM | POA: Diagnosis not present

## 2021-08-24 NOTE — Addendum Note (Signed)
Addended by: Lendon Collar on: 08/24/2021 02:08 PM   Modules accepted: Orders

## 2021-08-24 NOTE — Progress Notes (Signed)
Office Visit Note   Patient: Adam Vaughn           Date of Birth: 03/07/1976           MRN: [id number] Visit Date: 08/24/2021              Requested by: Cassandria Anger, MD [place],  [place] [address] PCP: Plotnikov, Evie Lacks, MD   Assessment & Plan: Visit Diagnoses:  1. Acute pain of right shoulder     Plan: Adam Vaughn had an MRI arthrogram of his right shoulder demonstrating an intra-articular long head biceps anchor avulsion from the superior glenoid with blunting of the superior labrum.  There was also a long posterior inferior labral tear.  No fracture dislocation or aggressive osseous lesion.  There were some degenerative changes at the [facility] joint but I do not think that symptomatic.  Rotator cuff was intact.  Has been wearing a sling as she is uncomfortable and certainly has a sensation of shoulder subluxation.  I think he will need an arthroscopic evaluation to repair the labral tear and address the biceps tendon.  I will refer him to Dr. Marlou Sa.  Answered all of his questions.  Shoulder pain started after an injury in late December.  Has not worked in over a year related to his chronic back pain  Follow-Up Instructions: Return Refer to Dr. Marlou Sa.   Orders:  No orders of the defined types were placed in this encounter.  No orders of the defined types were placed in this encounter.     Procedures: No procedures performed   Clinical Data: No additional findings.   Subjective: Chief Complaint  Patient presents with   Right Shoulder - Follow-up    MRI review  Patient presents today for follow up on his right shoulder. He had an MRI and is here today for those results.  Not much change in his symptoms.  Still wears a sling as he is uncomfortable and occasionally has a sensation of shoulder subluxation  HPI  Review of Systems   Objective: Vital Signs: There were no vitals taken for this visit.  Physical Exam Constitutional:       Appearance: He is well-developed.  Eyes:     Pupils: Pupils are equal, round, and reactive to light.  Pulmonary:     Effort: Pulmonary effort is normal.  Skin:    General: Skin is warm and dry.  Neurological:     Mental Status: He is alert and oriented to person, place, and time.  Psychiatric:        Behavior: Behavior normal.    Ortho Exam awake alert and oriented x3.  Comfortable sitting.  I did not perform a formal exam of the shoulder as he is wearing a sling and has been uncomfortable but he has good grip and good release.  Skin intact.  Specialty Comments:  No specialty comments available.  Imaging: No results found.   PMFS History: Patient Active Problem List   Diagnosis Date Noted   Pain in right shoulder 06/29/2021   Meteorism 06/01/2021   Sinus congestion 02/28/2021   Vertigo 02/17/2021   Headache 02/17/2021   Fever 02/17/2021   Falls 02/17/2021   Allergic rhinitis 10/25/2020   Superficial phlebitis 10/25/2020   Family history of acute polio 10/25/2020   Chest pain, atypical 10/10/2020   S/P laparoscopic cholecystectomy 10/10/2020   Hyperglycemia 10/10/2020   Acute cholecystitis 09/30/2020   Insomnia 09/26/2020   Knee pain 06/23/2020  Low vitamin B12 level 05/31/2020   Ankle sprain 05/31/2020   Constipation by delayed colonic transit 05/31/2020   Degenerative spondylolisthesis 04/11/2020   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   DDD (degenerative disc disease), lumbar 01/20/2020   Left lumbar radiculitis 01/20/2020   Vitamin D deficiency 01/15/2020   Low back pain 01/12/2020   IBS (irritable bowel syndrome) 01/12/2020   Nephrolithiasis 01/12/2020   Erectile dysfunction 01/12/2020   GERD (gastroesophageal reflux disease) 01/12/2020   Past Medical History:  Diagnosis Date   Arthritis    Family history of colon cancer    sister   GERD (gastroesophageal reflux disease)    History of kidney stones    Hypertension     Kidney stones     Family History  Problem Relation Age of Onset   Hypertension Father    Heart attack Father    Other Father        Small intestine removed but does not know why   Diabetes Sister    Heart disease Sister    Colon cancer Sister    Colon polyps Sister    Healthy Sister    Healthy Sister    Healthy Sister    Other Brother        MVA   Healthy Brother    Healthy Brother    Healthy Brother    Healthy Brother    Healthy Brother    Healthy Brother    Liver disease Neg Hx    Pancreatic cancer Neg Hx    Esophageal cancer Neg Hx    Stomach cancer Neg Hx     Past Surgical History:  Procedure Laterality Date   BACK SURGERY  04/11/2020   CHOLECYSTECTOMY N/A 10/01/2020   Procedure: LAPAROSCOPIC CHOLECYSTECTOMY;  Surgeon: Jovita Kussmaul, MD;  Location: [facility];  Service: General;  Laterality: N/A;   COLONOSCOPY     HAND SURGERY Bilateral    TONSILLECTOMY     VASECTOMY     Social History   Occupational History   Not on file  Tobacco Use   Smoking status: Never   Smokeless tobacco: Never  Vaping Use   Vaping Use: Never used  Substance and Sexual Activity   Alcohol use: Yes    Comment: occ   Drug use: Never   Sexual activity: Not Currently    Birth control/protection: Surgical    Comment: vastectomy

## 2021-08-30 ENCOUNTER — Ambulatory Visit: Payer: Managed Care, Other (non HMO) | Admitting: Orthopaedic Surgery

## 2021-08-30 HISTORY — PX: SHOULDER SURGERY: SHX246

## 2021-08-31 ENCOUNTER — Other Ambulatory Visit: Payer: Self-pay

## 2021-08-31 ENCOUNTER — Ambulatory Visit (INDEPENDENT_AMBULATORY_CARE_PROVIDER_SITE_OTHER): Payer: Managed Care, Other (non HMO) | Admitting: Orthopedic Surgery

## 2021-08-31 DIAGNOSIS — S43431A Superior glenoid labrum lesion of right shoulder, initial encounter: Secondary | ICD-10-CM

## 2021-09-01 ENCOUNTER — Encounter: Payer: Self-pay | Admitting: Orthopedic Surgery

## 2021-09-01 NOTE — Progress Notes (Signed)
? ?Office Visit Note ?  ?Patient: Adam Vaughn           ?Date of Birth: 1975-12-13           ?MRN: [id number] ?Visit Date: 08/31/2021 ?Requested by: Garald Balding, MD ?[address] ?[place],  [place] [address] ?PCP: Plotnikov, Evie Lacks, MD ? ?Subjective: ?Chief Complaint  ?Patient presents with  ? Right Shoulder - Pain  ? ? ?HPI: Patient presents for evaluation of right shoulder pain.  Date of injury 06/24/2021.  He had a fall due to his back giving out.  Landed on the right-hand side.  He is left-hand dominant.  Reports constant pain and clicking in the right shoulder.  The clicking is in the anterolateral aspect of the shoulder.  Radiates down to the elbow but not below the elbow.  No prior shoulder surgeries.  He feels like there is a tendon popping in his shoulder.  He does have a history of back surgery and he is disabled from that.  That surgery was 1-1/2 years ago.  He takes naproxen Lyrica and oxycodone.  The oxycodone is 10 mg 3 times a day.  MRI scan is reviewed of the right shoulder.  It does show detachment of the biceps tendon with portion of it remaining intra-articular with likely tearing of the transverse humeral ligament upper portion noted on the axial views.  There is also a posterior labral tear but he denies any history of posterior instability. ?             ?ROS: All systems reviewed are negative as they relate to the chief complaint within the history of present illness.  Patient denies  fevers or chills. ? ? ?Assessment & Plan: ?Visit Diagnoses:  ?1. Superior labrum anterior-to-posterior (SLAP) tear of right shoulder   ? ? ?Plan: Impression is SLAP tear with unstable biceps tendon detached from its glenoid attachment.  He also has a posterior labral tear which may or may not be significant.  Need to get a very good examination under anesthesia to determine the significance of that posterior labral tear.  I do think that he would do well with arthroscopy and possible posterior  labral repair with mini open biceps tenodesis.  Risk and benefits of that procedure discussed with the patient include not limited to infection nerve vessel damage incomplete pain relief as well as potential for persistent pain.  Patient understands risk benefits and wishes to proceed.  The rehabilitative process will really be guided by the necessity for posterior labral repair.  All questions answered. ? ?Follow-Up Instructions: No follow-ups on file.  ? ?Orders:  ?No orders of the defined types were placed in this encounter. ? ?No orders of the defined types were placed in this encounter. ? ? ? ? Procedures: ?No procedures performed ? ? ?Clinical Data: ?No additional findings. ? ?Objective: ?Vital Signs: There were no vitals taken for this visit. ? ?Physical Exam:  ? ?Constitutional: Patient appears well-developed ?HEENT:  ?Head: Normocephalic ?Eyes:EOM are normal ?Neck: Normal range of motion ?Cardiovascular: Normal rate ?Pulmonary/chest: Effort normal ?Neurologic: Patient is alert ?Skin: Skin is warm ?Psychiatric: Patient has normal mood and affect ? ? ?Ortho Exam: Ortho examination demonstrates tenderness to palpation with radiation from the bicipital groove down the anterior portion of the shoulder.  He does not have much in terms of anterior or posterior instability on examination today but is somewhat guarded.  His passive range of motion is 45/90/170 bilaterally.  He has mild pain  with resisted supination on the right but not on the left.  No discrete AC joint tenderness is present on the right or left hand side. ? ?Specialty Comments:  ?No specialty comments available. ? ?Imaging: ?No results found. ? ? ?PMFS History: ?Patient Active Problem List  ? Diagnosis Date Noted  ? Pain in right shoulder 06/29/2021  ? Meteorism 06/01/2021  ? Sinus congestion 02/28/2021  ? Vertigo 02/17/2021  ? Headache 02/17/2021  ? Fever 02/17/2021  ? Falls 02/17/2021  ? Allergic rhinitis 10/25/2020  ? Superficial phlebitis  10/25/2020  ? Family history of acute polio 10/25/2020  ? Chest pain, atypical 10/10/2020  ? S/P laparoscopic cholecystectomy 10/10/2020  ? Hyperglycemia 10/10/2020  ? Acute cholecystitis 09/30/2020  ? Insomnia 09/26/2020  ? Knee pain 06/23/2020  ? Low vitamin B12 level 05/31/2020  ? Ankle sprain 05/31/2020  ? Constipation by delayed colonic transit 05/31/2020  ? Degenerative spondylolisthesis 04/11/2020  ? 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 DDD (degenerative disc disease), lumbar 01/20/2020  ? Left lumbar radiculitis 01/20/2020  ? Vitamin D deficiency 01/15/2020  ? Low back pain 01/12/2020  ? IBS (irritable bowel syndrome) 01/12/2020  ? Nephrolithiasis 01/12/2020  ? Erectile dysfunction 01/12/2020  ? GERD (gastroesophageal reflux disease) 01/12/2020  ? ?Past Medical History:  ?Diagnosis Date  ? Arthritis   ? Family history of colon cancer   ? sister  ? GERD (gastroesophageal reflux disease)   ? History of kidney stones   ? Hypertension   ? Kidney stones   ?  ?Family History  ?Problem Relation Age of Onset  ? Hypertension Father   ? Heart attack Father   ? Other Father   ?     Small intestine removed but does not know why  ? Diabetes Sister   ? Heart disease Sister   ? Colon cancer Sister   ? Colon polyps Sister   ? Healthy Sister   ? Healthy Sister   ? Healthy Sister   ? Other Brother   ?     MVA  ? Healthy Brother   ? Healthy Brother   ? Healthy Brother   ? Healthy Brother   ? Healthy Brother   ? Healthy Brother   ? Liver disease Neg Hx   ? Pancreatic cancer Neg Hx   ? Esophageal cancer Neg Hx   ? Stomach cancer Neg Hx   ?  ?Past Surgical History:  ?Procedure Laterality Date  ? BACK SURGERY  04/11/2020  ? CHOLECYSTECTOMY N/A 10/01/2020  ? Procedure: LAPAROSCOPIC CHOLECYSTECTOMY;  Surgeon: Jovita Kussmaul, MD;  Location: [facility];  Service: General;  Laterality: N/A;  ? COLONOSCOPY    ? HAND SURGERY Bilateral   ? TONSILLECTOMY    ? VASECTOMY    ? ?Social History  ? ?Occupational  History  ? Not on file  ?Tobacco Use  ? Smoking status: Never  ? Smokeless tobacco: Never  ?Vaping Use  ? Vaping Use: Never used  ?Substance and Sexual Activity  ? Alcohol use: Yes  ?  Comment: occ  ? Drug use: Never  ? Sexual activity: Not Currently  ?  Birth control/protection: Surgical  ?  Comment: vastectomy  ? ? ? ? ? ?

## 2021-09-04 ENCOUNTER — Ambulatory Visit (INDEPENDENT_AMBULATORY_CARE_PROVIDER_SITE_OTHER): Payer: Managed Care, Other (non HMO) | Admitting: Internal Medicine

## 2021-09-04 ENCOUNTER — Encounter: Payer: Self-pay | Admitting: Internal Medicine

## 2021-09-04 ENCOUNTER — Other Ambulatory Visit: Payer: Self-pay

## 2021-09-04 VITALS — BP 126/76 | HR 80 | Temp 98.2°F | Ht 68.0 in | Wt 209.4 lb

## 2021-09-04 DIAGNOSIS — J4520 Mild intermittent asthma, uncomplicated: Secondary | ICD-10-CM

## 2021-09-04 DIAGNOSIS — G8929 Other chronic pain: Secondary | ICD-10-CM

## 2021-09-04 DIAGNOSIS — R739 Hyperglycemia, unspecified: Secondary | ICD-10-CM

## 2021-09-04 DIAGNOSIS — J45909 Unspecified asthma, uncomplicated: Secondary | ICD-10-CM | POA: Insufficient documentation

## 2021-09-04 DIAGNOSIS — N320 Bladder-neck obstruction: Secondary | ICD-10-CM | POA: Insufficient documentation

## 2021-09-04 DIAGNOSIS — M545 Low back pain, unspecified: Secondary | ICD-10-CM

## 2021-09-04 LAB — COMPREHENSIVE METABOLIC PANEL
ALT: 30 U/L (ref 0–53)
AST: 19 U/L (ref 0–37)
Albumin: 4.5 g/dL (ref 3.5–5.2)
Alkaline Phosphatase: 80 U/L (ref 39–117)
BUN: 18 mg/dL (ref 6–23)
CO2: 26 mEq/L (ref 19–32)
Calcium: 9.2 mg/dL (ref 8.4–10.5)
Chloride: 103 mEq/L (ref 96–112)
Creatinine, Ser: 0.86 mg/dL (ref 0.40–1.50)
GFR: 104.2 mL/min (ref >60.00)
Glucose, Bld: 95 mg/dL (ref 70–99)
Potassium: 4.1 mEq/L (ref 3.5–5.1)
Sodium: 138 mEq/L (ref 135–145)
Total Bilirubin: 0.5 mg/dL (ref 0.2–1.2)
Total Protein: 6.5 g/dL (ref 6.0–8.3)

## 2021-09-04 LAB — HEMOGLOBIN A1C: Hgb A1c MFr Bld: 5.5 % (ref 4.6–6.5)

## 2021-09-04 LAB — PSA: PSA: 1.83 ng/mL (ref 0.10–4.00)

## 2021-09-04 MED ORDER — ALBUTEROL SULFATE HFA 108 (90 BASE) MCG/ACT IN AERS: 2.0000 | INHALATION_SPRAY | Freq: Four times a day (QID) | RESPIRATORY_TRACT | 6 refills | Status: DC | PRN

## 2021-09-04 MED ORDER — METAXALONE 800 MG PO TABS
400.0000 mg | ORAL_TABLET | Freq: Three times a day (TID) | ORAL | 3 refills | Status: DC | PRN
Start: 2021-09-04 — End: 2021-10-10

## 2021-09-04 NOTE — Progress Notes (Signed)
Subjective:  Patient ID: Adam Vaughn, male    DOB: 1976/05/10  Age: 46 y.o. MRN: [id number]  CC: Follow-up   HPI Adam Vaughn presents for R shoulder surgery - Dr Marlou Sa on next Monday Father had prostate cancer -the patient is concerned C/o back spasms, hip pains  Outpatient Medications Prior to Visit  Medication Sig Dispense Refill   Aspirin-Acetaminophen (GOODYS BACK & BODY PAIN PO) Take 1 packet by mouth daily as needed (pain).     B Complex Vitamins (VITAMIN B COMPLEX PO) Take 1 tablet by mouth daily.     esomeprazole (NEXIUM) 40 MG capsule Take 1 capsule (40 mg total) by mouth 2 (two) times daily before a meal. 180 capsule 3   esomeprazole (NEXIUM) 40 MG capsule Take 1 capsule (40 mg total) by mouth daily. 90 capsule 3   fluticasone (FLONASE) 50 MCG/ACT nasal spray Place 2 sprays into both nostrils daily. 16 g 6   loratadine (CLARITIN) 10 MG tablet Take 10 mg by mouth daily as needed for allergies.     naproxen (NAPROSYN) 500 MG tablet Take 1 tablet (500 mg total) by mouth 2 (two) times daily as needed for moderate pain. 180 tablet 0   Oxycodone HCl 10 MG TABS Take 1 tablet (10 mg total) by mouth 3 (three) times daily. 90 tablet 0   oxymetazoline (AFRIN) 0.05 % nasal spray Place 1 spray into both nostrils daily.     pregabalin (LYRICA) 150 MG capsule Take 1 capsule (150 mg total) by mouth 2 (two) times daily. 60 capsule 5   tiZANidine (ZANAFLEX) 2 MG tablet Take 1-2 tablets (2-4 mg total) by mouth at bedtime as needed for muscle spasms. 60 tablet 5   No facility-administered medications prior to visit.    ROS: Review of Systems  Constitutional:  Negative for appetite change, fatigue and unexpected weight change.  HENT:  Negative for congestion, nosebleeds, sneezing, sore throat and trouble swallowing.   Eyes:  Negative for itching and visual disturbance.  Respiratory:  Negative for cough.   Cardiovascular:  Negative for chest pain, palpitations and leg swelling.   Gastrointestinal:  Negative for abdominal distention, blood in stool, diarrhea and nausea.  Genitourinary:  Negative for frequency and hematuria.  Musculoskeletal:  Positive for arthralgias, back pain and gait problem. Negative for joint swelling and neck pain.  Skin:  Negative for rash.  Neurological:  Positive for weakness. Negative for dizziness, tremors and speech difficulty.  Psychiatric/Behavioral:  Negative for agitation, dysphoric mood and sleep disturbance. The patient is not nervous/anxious.    Objective:  BP 126/76 (BP Location: Left Arm, Patient Position: Sitting, Cuff Size: Large)    Pulse 80    Temp 98.2 F (36.8 C) (Oral)    Ht '5\' 8"'$  (1.727 m)    Wt 209 lb 6.4 oz (95 kg)    SpO2 100%    BMI 31.84 kg/m   BP Readings from Last 3 Encounters:  09/04/21 126/76  07/24/21 140/80  07/13/21 118/82    Wt Readings from Last 3 Encounters:  09/04/21 209 lb 6.4 oz (95 kg)  08/02/21 210 lb (95.3 kg)  07/24/21 210 lb (95.3 kg)    Physical Exam Constitutional:      General: He is not in acute distress.    Appearance: Normal appearance. He is well-developed.     Comments: NAD  Eyes:     Conjunctiva/sclera: Conjunctivae normal.     Pupils: Pupils are equal, round, and reactive to light.  Neck:     Thyroid: No thyromegaly.     Vascular: No JVD.  Cardiovascular:     Rate and Rhythm: Normal rate and regular rhythm.     Heart sounds: Normal heart sounds. No murmur heard.   No friction rub. No gallop.  Pulmonary:     Effort: Pulmonary effort is normal. No respiratory distress.     Breath sounds: Normal breath sounds. No wheezing or rales.  Chest:     Chest wall: No tenderness.  Abdominal:     General: Bowel sounds are normal. There is no distension.     Palpations: Abdomen is soft. There is no mass.     Tenderness: There is no abdominal tenderness. There is no guarding or rebound.  Musculoskeletal:        General: Tenderness present. Normal range of motion.     Cervical  back: Normal range of motion.  Lymphadenopathy:     Cervical: No cervical adenopathy.  Skin:    General: Skin is warm and dry.     Findings: No rash.  Neurological:     Mental Status: He is alert and oriented to person, place, and time.     Cranial Nerves: No cranial nerve deficit.     Motor: Weakness present. No abnormal muscle tone.     Coordination: Coordination abnormal.     Gait: Gait abnormal.     Deep Tendon Reflexes: Reflexes are normal and symmetric.  Psychiatric:        Behavior: Behavior normal.        Thought Content: Thought content normal.        Judgment: Judgment normal.  Antalgic spastic gait  Lab Results  Component Value Date   WBC 3.8 (L) 02/17/2021   HGB 14.4 02/17/2021   HCT 42.2 02/17/2021   PLT 195.0 02/17/2021   GLUCOSE 95 09/04/2021   ALT 30 09/04/2021   AST 19 09/04/2021   NA 138 09/04/2021   K 4.1 09/04/2021   CL 103 09/04/2021   CREATININE 0.86 09/04/2021   BUN 18 09/04/2021   CO2 26 09/04/2021   TSH 1.26 01/14/2020   PSA 1.83 09/04/2021   HGBA1C 5.5 09/04/2021    MR SHOULDER RIGHT W CONTRAST  Result Date: 08/15/2021 CLINICAL DATA:  Right shoulder and arm pain. EXAM: MR ARTHROGRAM OF THE RIGHT SHOULDER TECHNIQUE: Multiplanar, multisequence MR imaging of the right shoulder was performed following the administration of intra-articular contrast. CONTRAST:  See Injection Documentation. COMPARISON:  X-ray shoulder 06/25/2021. FINDINGS: Rotator cuff: Supraspinatus tendon is intact. Infraspinatus tendon is intact. Teres minor tendon is intact. Subscapularis tendon is intact. Muscles: No muscle atrophy or edema. No intramuscular fluid collection or hematoma. Biceps Long Head: There is intra-articular long head of the biceps anchor avulsion from the superior glenoid with blunting of the superior labrum. Acromioclavicular Joint: Moderate arthropathy of the acromioclavicular joint. No subacromial/subdeltoid bursal fluid. Glenohumeral Joint: No full-thickness  cartilage defect. Labrum: There is a long posteroinferior labral tear (series 3, image 12, 13). Blunting of the superior labrum about the biceps anchor consistent with avulsion tear. Bones: No fracture or dislocation. No aggressive osseous lesion. Other: No fluid collection or hematoma. IMPRESSION: 1. Avulsion of the intra-articular long head of the biceps anchor from superior labrum with blunting of the superior labrum. 2.  Long posteroinferior glenoid labral tear. 3.  Moderate acromioclavicular osteoarthritis. 4.  No evidence of fracture or dislocation. Electronically Signed   By: Keane Police D.O.   On: 08/15/2021 10:07  Arthrogram  Result Date: 08/14/2021 CLINICAL DATA:  Right shoulder pain. FLUOROSCOPY TIME:  Radiation Exposure Index (as provided by the fluoroscopic device): 0.8 mGy Kerma PROCEDURE: The risks and benefits of the procedure were discussed with the patient, and written informed consent was obtained. The patient stated no history of allergy to contrast media. A formal timeout procedure was performed with the patient according to departmental protocol. The patient was placed supine on the fluoroscopy table and the right glenohumeral joint was identified under fluoroscopy. The skin overlying the right glenohumeral joint was subsequently cleaned with Betadine and a sterile drape was placed over the area of interest. 2 ml 1% Lidocaine was used to anesthetize the skin around the needle insertion site. A 22 gauge spinal needle was inserted into the right glenohumeral joint under fluoroscopy. 12 ml of gadolinium mixture (0.1 ml of Multihance mixed with 15 ml of Isovue-M 200 contrast and 5 ml of sterile saline) were injected into the right glenohumeral joint. The needle was removed and hemostasis was achieved. The patient was subsequently transferred to MRI for imaging. IMPRESSION: Technically successful right shoulder injection for MRI. Electronically Signed   By: Titus Dubin M.D.   On:  08/14/2021 15:19    Assessment & Plan:   Problem List Items Addressed This Visit     Asthmatic bronchitis    Proair MDI prn      Relevant Medications   albuterol (PROAIR HFA) 108 (90 Base) MCG/ACT inhaler   Bladder neck obstruction - Primary   Relevant Orders   PSA (Completed)   Hyperglycemia   Relevant Orders   Comprehensive metabolic panel (Completed)   Hemoglobin A1c (Completed)   Low back pain   Relevant Medications   metaxalone (SKELAXIN) 800 MG tablet   Other Relevant Orders   Comprehensive metabolic panel (Completed)   Hemoglobin A1c (Completed)      Meds ordered this encounter  Medications   metaxalone (SKELAXIN) 800 MG tablet    Sig: Take 0.5-1 tablets (400-800 mg total) by mouth 3 (three) times daily as needed for muscle spasms.    Dispense:  90 tablet    Refill:  3   albuterol (PROAIR HFA) 108 (90 Base) MCG/ACT inhaler    Sig: Inhale 2 puffs into the lungs every 6 (six) hours as needed for wheezing or shortness of breath.    Dispense:  1 each    Refill:  6      Follow-up: Return in about 2 months (around 11/04/2021) for a follow-up visit.  Walker Kehr, MD

## 2021-09-04 NOTE — Assessment & Plan Note (Signed)
Proair MDI prn 

## 2021-09-05 ENCOUNTER — Telehealth: Payer: Self-pay

## 2021-09-05 NOTE — Telephone Encounter (Signed)
Pts surgery is scheduled for 09/11/21 @ [facility] and [employer] has denied all codes. I called and set up peer to peer for tomorrow 3/8 @ 9:30 (only time that was available). A Dr. Sindy Guadeloupe will be calling. Denial rationale on your desk.  ?

## 2021-09-06 ENCOUNTER — Encounter: Payer: Self-pay | Admitting: Internal Medicine

## 2021-09-06 NOTE — Telephone Encounter (Signed)
[facility] doctor was calling the wrong # for Dr. Marlou Sa. I called and rescheduled it for today @ 2:30. ?

## 2021-09-07 ENCOUNTER — Encounter: Payer: Self-pay | Admitting: Gastroenterology

## 2021-09-11 ENCOUNTER — Other Ambulatory Visit: Payer: Self-pay | Admitting: Surgical

## 2021-09-11 ENCOUNTER — Encounter: Payer: Self-pay | Admitting: Orthopedic Surgery

## 2021-09-11 DIAGNOSIS — S43431A Superior glenoid labrum lesion of right shoulder, initial encounter: Secondary | ICD-10-CM | POA: Diagnosis not present

## 2021-09-11 DIAGNOSIS — M7521 Bicipital tendinitis, right shoulder: Secondary | ICD-10-CM | POA: Diagnosis not present

## 2021-09-11 DIAGNOSIS — S43491D Other sprain of right shoulder joint, subsequent encounter: Secondary | ICD-10-CM | POA: Diagnosis not present

## 2021-09-11 MED ORDER — OXYCODONE HCL 10 MG PO TABS: 10.0000 mg | ORAL_TABLET | Freq: Three times a day (TID) | ORAL | 0 refills | Status: DC

## 2021-09-12 ENCOUNTER — Other Ambulatory Visit: Payer: Self-pay | Admitting: Surgical

## 2021-09-12 ENCOUNTER — Ambulatory Visit: Payer: Managed Care, Other (non HMO) | Admitting: Internal Medicine

## 2021-09-12 ENCOUNTER — Encounter: Payer: Self-pay | Admitting: Orthopedic Surgery

## 2021-09-12 MED ORDER — ONDANSETRON HCL 4 MG PO TABS: 4.0000 mg | ORAL_TABLET | Freq: Three times a day (TID) | ORAL | 0 refills | Status: DC | PRN

## 2021-09-12 NOTE — Telephone Encounter (Signed)
I called and discussed with Adam Vaughn about his symptoms.  All of his dizziness and blurred vision he says is par for the course for him after anesthesia and he has had the same exact symptoms for several days after his last 3 surgeries.  No other concerning symptoms.  Prescribe Zofran for nausea and sent it to his pharmacy.  He will follow-up on Monday as directed.

## 2021-09-13 ENCOUNTER — Encounter: Payer: Self-pay | Admitting: Gastroenterology

## 2021-09-13 ENCOUNTER — Encounter: Payer: Self-pay | Admitting: Orthopedic Surgery

## 2021-09-14 ENCOUNTER — Telehealth: Payer: Self-pay | Admitting: Gastroenterology

## 2021-09-14 NOTE — Telephone Encounter (Signed)
Called and spoke with patient. He wanted to know if he could still come for his procedure since he just had Right shoulder surgery on Monday. Advised patient that we typically position patients on the left side for procedures. Pt verbalized understanding and had no further concerns.  ?

## 2021-09-14 NOTE — Telephone Encounter (Signed)
Inbound call from patient stating that he had right shoulder surgery on Monday and is seeking advice if he is still okay to come in for her Endo and Colon tomorrow. Please advise.  ?

## 2021-09-15 ENCOUNTER — Other Ambulatory Visit: Payer: Self-pay | Admitting: Gastroenterology

## 2021-09-15 ENCOUNTER — Ambulatory Visit (AMBULATORY_SURGERY_CENTER): Payer: Managed Care, Other (non HMO) | Admitting: Gastroenterology

## 2021-09-15 ENCOUNTER — Encounter: Payer: Self-pay | Admitting: Gastroenterology

## 2021-09-15 ENCOUNTER — Other Ambulatory Visit: Payer: Self-pay

## 2021-09-15 VITALS — BP 144/78 | HR 76 | Temp 99.5°F | Resp 15 | Ht 68.0 in | Wt 210.0 lb

## 2021-09-15 DIAGNOSIS — Z1211 Encounter for screening for malignant neoplasm of colon: Secondary | ICD-10-CM | POA: Diagnosis not present

## 2021-09-15 DIAGNOSIS — K2960 Other gastritis without bleeding: Secondary | ICD-10-CM | POA: Diagnosis not present

## 2021-09-15 DIAGNOSIS — Z800 Family history of malignant neoplasm of digestive organs: Secondary | ICD-10-CM

## 2021-09-15 DIAGNOSIS — D122 Benign neoplasm of ascending colon: Secondary | ICD-10-CM | POA: Diagnosis not present

## 2021-09-15 DIAGNOSIS — K2970 Gastritis, unspecified, without bleeding: Secondary | ICD-10-CM

## 2021-09-15 DIAGNOSIS — K219 Gastro-esophageal reflux disease without esophagitis: Secondary | ICD-10-CM

## 2021-09-15 DIAGNOSIS — R12 Heartburn: Secondary | ICD-10-CM | POA: Diagnosis not present

## 2021-09-15 DIAGNOSIS — K6389 Other specified diseases of intestine: Secondary | ICD-10-CM

## 2021-09-15 DIAGNOSIS — K529 Noninfective gastroenteritis and colitis, unspecified: Secondary | ICD-10-CM | POA: Diagnosis not present

## 2021-09-15 HISTORY — PX: OTHER SURGICAL HISTORY: SHX169

## 2021-09-15 MED ORDER — SODIUM CHLORIDE 0.9 % IV SOLN: 500.0000 mL | Freq: Once | INTRAVENOUS | Status: DC

## 2021-09-15 NOTE — Progress Notes (Signed)
Updated pt hx and medical record. ? ?Vitals DT ?

## 2021-09-15 NOTE — Progress Notes (Signed)
Called to room to assist during endoscopic procedure.  Patient ID and intended procedure confirmed with present staff. Received instructions for my participation in the procedure from the performing physician.  

## 2021-09-15 NOTE — Patient Instructions (Signed)
Handouts on hemorrhoids, polyps and gastritis given. ?Await pathology results. ?Resume previous diet and continue present medications. ? ? ?YOU HAD AN ENDOSCOPIC PROCEDURE TODAY AT [facility]:   Refer to the procedure report that was given to you for any specific questions about what was found during the examination.  If the procedure report does not answer your questions, please call your gastroenterologist to clarify.  If you requested that your care partner not be given the details of your procedure findings, then the procedure report has been included in a sealed envelope for you to review at your convenience later. ? ?YOU SHOULD EXPECT: Some feelings of bloating in the abdomen. Passage of more gas than usual.  Walking can help get rid of the air that was put into your GI tract during the procedure and reduce the bloating. If you had a lower endoscopy (such as a colonoscopy or flexible sigmoidoscopy) you may notice spotting of blood in your stool or on the toilet paper. If you underwent a bowel prep for your procedure, you may not have a normal bowel movement for a few days. ? ?Please Note:  You might notice some irritation and congestion in your nose or some drainage.  This is from the oxygen used during your procedure.  There is no need for concern and it should clear up in a day or so. ? ?SYMPTOMS TO REPORT IMMEDIATELY: ? ?Following lower endoscopy (colonoscopy or flexible sigmoidoscopy): ? Excessive amounts of blood in the stool ? Significant tenderness or worsening of abdominal pains ? Swelling of the abdomen that is new, acute ? Fever of 100?F or higher ? ?Following upper endoscopy (EGD) ? Vomiting of blood or coffee ground material ? New chest pain or pain under the shoulder blades ? Painful or persistently difficult swallowing ? New shortness of breath ? Fever of 100?F or higher ? Black, tarry-looking stools ? ?For urgent or emergent issues, a gastroenterologist can be reached at any  hour by calling [phone]. ?Do not use MyChart messaging for urgent concerns.  ? ? ?DIET:  We do recommend a small meal at first, but then you may proceed to your regular diet.  Drink plenty of fluids but you should avoid alcoholic beverages for 24 hours. ? ?ACTIVITY:  You should plan to take it easy for the rest of today and you should NOT DRIVE or use heavy machinery until tomorrow (because of the sedation medicines used during the test).   ? ?FOLLOW UP: ?Our staff will call the number listed on your records 48-72 hours following your procedure to check on you and address any questions or concerns that you may have regarding the information given to you following your procedure. If we do not reach you, we will leave a message.  We will attempt to reach you two times.  During this call, we will ask if you have developed any symptoms of COVID 19. If you develop any symptoms (ie: fever, flu-like symptoms, shortness of breath, cough etc.) before then, please call [phone].  If you test positive for Covid 19 in the 2 weeks post procedure, please call and report this information to [place].   ? ?If any biopsies were taken you will be contacted by phone or by letter within the next 1-3 weeks.  Please call us at [phone] if you have not heard about the biopsies in 3 weeks.  ? ? ?SIGNATURES/CONFIDENTIALITY: ?You and/or your care partner have signed paperwork which will be entered into  your electronic medical record.  These signatures attest to the fact that that the information above on your After Visit Summary has been reviewed and is understood.  Full responsibility of the confidentiality of this discharge information lies with you and/or your care-partner.  ?

## 2021-09-15 NOTE — Op Note (Signed)
[place] ?Patient Name: Adam Vaughn ?Procedure Date: 09/15/2021 7:41 AM ?MRN: [id number] ?Endoscopist: Milus Banister , MD ?Age: 46 ?Referring MD:  ?Date of Birth: [birth year] ?Gender: Male ?Account #: [id number] ?Procedure:                Upper GI endoscopy ?Indications:              Heartburn ?Medicines:                Monitored Anesthesia Care ?Procedure:                Pre-Anesthesia Assessment: ?                          - Prior to the procedure, a History and Physical  ?                          was performed, and patient medications and  ?                          allergies were reviewed. The patient's tolerance of  ?                          previous anesthesia was also reviewed. The risks  ?                          and benefits of the procedure and the sedation  ?                          options and risks were discussed with the patient.  ?                          All questions were answered, and informed consent  ?                          was obtained. Prior Anticoagulants: The patient has  ?                          taken no previous anticoagulant or antiplatelet  ?                          agents. ASA Grade Assessment: II - A patient with  ?                          mild systemic disease. After reviewing the risks  ?                          and benefits, the patient was deemed in  ?                          satisfactory condition to undergo the procedure. ?                          After obtaining informed consent, the endoscope was  ?  passed under direct vision. Throughout the  ?                          procedure, the patient's blood pressure, pulse, and  ?                          oxygen saturations were monitored continuously. The  ?                          GIF [id number] #[id number] was introduced through the  ?                          mouth, and advanced to the second part of duodenum.  ?                          The upper GI endoscopy was accomplished  without  ?                          difficulty. The patient tolerated the procedure  ?                          well. ?Scope In: ?Scope Out: ?Findings:                 Mild inflammation characterized by erythema,  ?                          friability and granularity was found in the gastric  ?                          antrum. Biopsies were taken with a cold forceps for  ?                          histology. ?                          The exam was otherwise without abnormality. ?Complications:            No immediate complications. Estimated blood loss:  ?                          None. ?Estimated Blood Loss:     Estimated blood loss: none. ?Impression:               - Mild, non-specific distal gastritis. Biopsied to  ?                          check for H. pylori. ?                          - The examination was otherwise normal. ?Recommendation:           - Patient has a contact number available for  ?                          emergencies. The signs and symptoms of potential  ?  delayed complications were discussed with the  ?                          patient. Return to normal activities tomorrow.  ?                          Written discharge instructions were provided to the  ?                          patient. ?                          - Resume previous diet. ?                          - Continue present medications. ?                          - Await pathology results. ?Milus Banister, MD ?09/15/2021 8:24:55 AM ?This report has been signed electronically. ?

## 2021-09-15 NOTE — Progress Notes (Signed)
HPI: ?This is a man with FH of CRC (sister) and long standing GERD.  Alternating bowels ? ? ?ROS: complete GI ROS as described in HPI, all other review negative. ? ?Constitutional:  No unintentional weight loss ? ? ?Past Medical History:  ?Diagnosis Date  ? Arthritis   ? Family history of colon cancer   ? sister  ? GERD (gastroesophageal reflux disease)   ? History of kidney stones   ? Hypertension   ? Kidney stones   ? ? ?Past Surgical History:  ?Procedure Laterality Date  ? BACK SURGERY  04/11/2020  ? CHOLECYSTECTOMY N/A 10/01/2020  ? Procedure: LAPAROSCOPIC CHOLECYSTECTOMY;  Surgeon: Jovita Kussmaul, MD;  Location: [facility];  Service: General;  Laterality: N/A;  ? COLONOSCOPY  1991  ? IBS  ? endocolon  09/15/2021  ? FHCC, GERD  ? HAND SURGERY Bilateral   ? TONSILLECTOMY    ? VASECTOMY    ? ? ?Current Outpatient Medications  ?Medication Sig Dispense Refill  ? albuterol (PROAIR HFA) 108 (90 Base) MCG/ACT inhaler Inhale 2 puffs into the lungs every 6 (six) hours as needed for wheezing or shortness of breath. 1 each 6  ? B Complex Vitamins (VITAMIN B COMPLEX PO) Take 1 tablet by mouth daily.    ? esomeprazole (NEXIUM) 40 MG capsule Take 1 capsule (40 mg total) by mouth daily. 90 capsule 3  ? loratadine (CLARITIN) 10 MG tablet Take 10 mg by mouth daily as needed for allergies.    ? metaxalone (SKELAXIN) 800 MG tablet Take 0.5-1 tablets (400-800 mg total) by mouth 3 (three) times daily as needed for muscle spasms. 90 tablet 3  ? naproxen (NAPROSYN) 500 MG tablet Take 1 tablet (500 mg total) by mouth 2 (two) times daily as needed for moderate pain. 180 tablet 0  ? ondansetron (ZOFRAN) 4 MG tablet Take 1 tablet (4 mg total) by mouth every 8 (eight) hours as needed for nausea or vomiting. 20 tablet 0  ? Oxycodone HCl 10 MG TABS Take 1 tablet (10 mg total) by mouth 3 (three) times daily. Take 1 tablet by mouth every 3-4 hours as needed postoperatively. 90 tablet 0  ? oxymetazoline (AFRIN) 0.05 % nasal spray Place 1 spray into  both nostrils daily.    ? pregabalin (LYRICA) 150 MG capsule Take 1 capsule (150 mg total) by mouth 2 (two) times daily. 60 capsule 5  ? Aspirin-Acetaminophen (GOODYS BACK & BODY PAIN PO) Take 1 packet by mouth daily as needed (pain).    ? esomeprazole (NEXIUM) 40 MG capsule Take 1 capsule (40 mg total) by mouth 2 (two) times daily before a meal. (Patient not taking: Reported on 09/15/2021) 180 capsule 3  ? fluticasone (FLONASE) 50 MCG/ACT nasal spray Place 2 sprays into both nostrils daily. 16 g 6  ? ?No current facility-administered medications for this visit.  ? ? ?Allergies as of 09/15/2021 - Review Complete 09/15/2021  ?Allergen Reaction Noted  ? Gabapentin  05/31/2020  ? ? ?Family History  ?Problem Relation Age of Onset  ? Hypertension Father   ? Heart attack Father   ? Other Father   ?     Small intestine removed but does not know why  ? Diabetes Sister   ? Heart disease Sister   ? Colon cancer Sister   ? Colon polyps Sister   ? Colon polyps Sister   ? Healthy Sister   ? Healthy Sister   ? Healthy Sister   ? Other Brother   ?  MVA  ? Healthy Brother   ? Healthy Brother   ? Healthy Brother   ? Healthy Brother   ? Healthy Brother   ? Healthy Brother   ? Liver disease Neg Hx   ? Pancreatic cancer Neg Hx   ? Esophageal cancer Neg Hx   ? Stomach cancer Neg Hx   ? ? ?Social History  ? ?Socioeconomic History  ? Marital status: Married  ?  Spouse name: Not on file  ? Number of children: Not on file  ? Years of education: Not on file  ? Highest education level: Not on file  ?Occupational History  ? Not on file  ?Tobacco Use  ? Smoking status: Never  ? Smokeless tobacco: Never  ?Vaping Use  ? Vaping Use: Never used  ?Substance and Sexual Activity  ? Alcohol use: Not Currently  ?  Comment: states uit around age 24  ? Drug use: Never  ? Sexual activity: Not Currently  ?  Birth control/protection: Surgical  ?  Comment: vastectomy  ?Other Topics Concern  ? Not on file  ?Social History Narrative  ? Not on file  ? ?Social  Determinants of Health  ? ?Financial Resource Strain: Not on file  ?Food Insecurity: Not on file  ?Transportation Needs: Not on file  ?Physical Activity: Not on file  ?Stress: Not on file  ?Social Connections: Not on file  ?Intimate Partner Violence: Not on file  ? ? ? ?Physical Exam: ?Temp 99.5 ?F (37.5 ?C) (Temporal)  ?Constitutional: generally well-appearing ?Psychiatric: alert and oriented x3 ?Lungs: CTA bilaterally ?Heart: no MCR ? ?Assessment and plan: ?46 y.o. male with FH of CRC, long standing GERD, alternating bowel habits ? ?Colonoscopy and EGD today. ? ?Care is appropriate for the ambulatory setting. ? ?Owens Loffler, MD ?[facility] ?09/15/2021, 7:38 AM ? ? ? ?

## 2021-09-15 NOTE — Progress Notes (Signed)
Report to PACU, RN, vss, BBS= Clear.  

## 2021-09-15 NOTE — Op Note (Signed)
[place] ?Patient Name: Adam Vaughn ?Procedure Date: 09/15/2021 7:42 AM ?MRN: [id number] ?Endoscopist: Milus Banister , MD ?Age: 46 ?Referring MD:  ?Date of Birth: [birth year] ?Gender: Male ?Account #: [id number] ?Procedure:                Colonoscopy ?Indications:              Screening in patient at increased risk: Sister with  ?                          CRC in her 19s ?Medicines:                Monitored Anesthesia Care ?Procedure:                Pre-Anesthesia Assessment: ?                          - Prior to the procedure, a History and Physical  ?                          was performed, and patient medications and  ?                          allergies were reviewed. The patient's tolerance of  ?                          previous anesthesia was also reviewed. The risks  ?                          and benefits of the procedure and the sedation  ?                          options and risks were discussed with the patient.  ?                          All questions were answered, and informed consent  ?                          was obtained. Prior Anticoagulants: The patient has  ?                          taken no previous anticoagulant or antiplatelet  ?                          agents. ASA Grade Assessment: II - A patient with  ?                          mild systemic disease. After reviewing the risks  ?                          and benefits, the patient was deemed in  ?                          satisfactory condition to undergo the procedure. ?  After obtaining informed consent, the colonoscope  ?                          was passed under direct vision. Throughout the  ?                          procedure, the patient's blood pressure, pulse, and  ?                          oxygen saturations were monitored continuously. The  ?                          CF [id number] #[id number] was introduced through the anus  ?                          and advanced to the the cecum,  identified by  ?                          appendiceal orifice and ileocecal valve. The  ?                          colonoscopy was performed without difficulty. The  ?                          patient tolerated the procedure well. The quality  ?                          of the bowel preparation was good. The ileocecal  ?                          valve, appendiceal orifice, and rectum were  ?                          photographed. ?Scope In: 8:01:47 AM ?Scope Out: 8:13:24 AM ?Scope Withdrawal Time: 0 hours 10 minutes 12 seconds  ?Total Procedure Duration: 0 hours 11 minutes 37 seconds  ?Findings:                 A 6 mm polyp was found in the ascending colon. The  ?                          polyp was sessile. The polyp was removed with a  ?                          cold snare. Resection and retrieval were complete. ?                          A localized, 20cm long segment of mildly  ?                          erythematous mucosa was found in the sigmoid colon.  ?                          Biopsies were taken with a  cold forceps for  ?                          histology. ? Prep effect. ?                          Internal hemorrhoids were found. The hemorrhoids  ?                          were small. ?                          The exam was otherwise without abnormality on  ?                          direct and retroflexion views. ?Complications:            No immediate complications. Estimated blood loss:  ?                          None. ?Estimated Blood Loss:     Estimated blood loss: none. ?Impression:               - One 6 mm polyp in the ascending colon, removed  ?                          with a cold snare. Resected and retrieved. ?                          - Erythematous mucosa in the sigmoid colon.  ?                          Biopsied. ? Prep effect ?                          - Internal hemorrhoids. ?                          - The examination was otherwise normal on direct  ?                          and  retroflexion views. ?Recommendation:           - EGD now. ?                          - Await pathology results. ?Milus Banister, MD ?09/15/2021 8:16:27 AM ?This report has been signed electronically. ?

## 2021-09-18 ENCOUNTER — Ambulatory Visit (INDEPENDENT_AMBULATORY_CARE_PROVIDER_SITE_OTHER): Payer: Managed Care, Other (non HMO) | Admitting: Orthopedic Surgery

## 2021-09-18 ENCOUNTER — Other Ambulatory Visit: Payer: Self-pay

## 2021-09-18 DIAGNOSIS — S43431A Superior glenoid labrum lesion of right shoulder, initial encounter: Secondary | ICD-10-CM

## 2021-09-19 ENCOUNTER — Telehealth: Payer: Self-pay | Admitting: *Deleted

## 2021-09-19 ENCOUNTER — Telehealth: Payer: Self-pay

## 2021-09-19 NOTE — Telephone Encounter (Signed)
?  Follow up Call- ? ?Call back number 09/15/2021  ?Post procedure Call Back phone  # [phone]  ?Permission to leave phone message Yes  ?Some recent data might be hidden  ?  ? ?Patient questions: ? ?Do you have a fever, pain , or abdominal swelling? No. ?Pain Score  0 * ? ?Have you tolerated food without any problems? Yes.   ? ?Have you been able to return to your normal activities? Yes.   ? ?Do you have any questions about your discharge instructions: ?Diet   No. ?Medications  No. ?Follow up visit  No. ? ?Do you have questions or concerns about your Care? No. ? ?Actions: ?* If pain score is 4 or above: ?No action needed, pain <4. ? ?Have you developed a fever since your procedure? no ? ?2.   Have you had an respiratory symptoms (SOB or cough) since your procedure? no ? ?3.   Have you tested positive for COVID 19 since your procedure no ? ?4.   Have you had any family members/close contacts diagnosed with the COVID 19 since your procedure?  no ? ? ?If yes to any of these questions please route to Joylene John, RN and Joella Prince, RN  ? ? ?

## 2021-09-19 NOTE — Telephone Encounter (Signed)
First attempt, left VM.  

## 2021-09-21 ENCOUNTER — Encounter: Payer: Self-pay | Admitting: Orthopedic Surgery

## 2021-09-21 NOTE — Progress Notes (Signed)
? ?Post-Op Visit Note ?  ?Patient: Adam Vaughn           ?Date of Birth: [birth year]           ?MRN: [id number] ?Visit Date: 09/18/2021 ?PCP: Plotnikov, Evie Lacks, MD ? ? ?Assessment & Plan: ? ?Chief Complaint: No chief complaint on file. ? ?Visit Diagnoses:  ?1. Superior labrum anterior-to-posterior (SLAP) tear of right shoulder   ? ? ?Plan: Patient presents now a week out right shoulder arthroscopy debridement biceps tenodesis as well as posterior labral repair.  He had a fall last Thursday landed on his shoulder.  Taking pain meds every 8 hours.  On examination he is got reasonable posterior stability and good contour of the biceps.  Deltoid fires.  Incisions intact.  Sutures removed.  Plan at this time is to start doing pendulums only in 7 days 30 rotations clockwise and counterclockwise once a day the back in the sling.  2-week return for initiation of formal physical therapy at that time. ? ?Follow-Up Instructions: Return in about 2 weeks (around 10/02/2021).  ? ?Orders:  ?No orders of the defined types were placed in this encounter. ? ?No orders of the defined types were placed in this encounter. ? ? ?Imaging: ?No results found. ? ?PMFS History: ?Patient Active Problem List  ? Diagnosis Date Noted  ? Bladder neck obstruction 09/04/2021  ? Asthmatic bronchitis 09/04/2021  ? Pain in right shoulder 06/29/2021  ? Meteorism 06/01/2021  ? Sinus congestion 02/28/2021  ? Vertigo 02/17/2021  ? Headache 02/17/2021  ? Fever 02/17/2021  ? Falls 02/17/2021  ? Allergic rhinitis 10/25/2020  ? Superficial phlebitis 10/25/2020  ? Family history of acute polio 10/25/2020  ? Chest pain, atypical 10/10/2020  ? S/P laparoscopic cholecystectomy 10/10/2020  ? Hyperglycemia 10/10/2020  ? Acute cholecystitis 09/30/2020  ? Insomnia 09/26/2020  ? Knee pain 06/23/2020  ? Low vitamin B12 level 05/31/2020  ? Ankle sprain 05/31/2020  ? Constipation by delayed colonic transit 05/31/2020  ? Degenerative spondylolisthesis 04/11/2020  ?  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 DDD (degenerative disc disease), lumbar 01/20/2020  ? Left lumbar radiculitis 01/20/2020  ? Vitamin D deficiency 01/15/2020  ? Low back pain 01/12/2020  ? IBS (irritable bowel syndrome) 01/12/2020  ? Nephrolithiasis 01/12/2020  ? Erectile dysfunction 01/12/2020  ? GERD (gastroesophageal reflux disease) 01/12/2020  ? ?Past Medical History:  ?Diagnosis Date  ? Arthritis   ? Family history of colon cancer   ? sister  ? GERD (gastroesophageal reflux disease)   ? History of kidney stones   ? Hypertension   ? Kidney stones   ?  ?Family History  ?Problem Relation Age of Onset  ? Hypertension Father   ? Heart attack Father   ? Other Father   ?     Small intestine removed but does not know why  ? Diabetes Sister   ? Heart disease Sister   ? Colon cancer Sister   ? Colon polyps Sister   ? Colon polyps Sister   ? Healthy Sister   ? Healthy Sister   ? Healthy Sister   ? Other Brother   ?     MVA  ? Healthy Brother   ? Healthy Brother   ? Healthy Brother   ? Healthy Brother   ? Healthy Brother   ? Healthy Brother   ? Liver disease Neg Hx   ? Pancreatic cancer Neg Hx   ? Esophageal cancer Neg Hx   ?  Stomach cancer Neg Hx   ?  ?Past Surgical History:  ?Procedure Laterality Date  ? BACK SURGERY  04/11/2020  ? CHOLECYSTECTOMY N/A 10/01/2020  ? Procedure: LAPAROSCOPIC CHOLECYSTECTOMY;  Surgeon: Jovita Kussmaul, MD;  Location: [facility];  Service: General;  Laterality: N/A;  ? COLONOSCOPY  1991  ? IBS  ? endocolon  09/15/2021  ? FHCC, GERD  ? HAND SURGERY Bilateral   ? TONSILLECTOMY    ? VASECTOMY    ? ?Social History  ? ?Occupational History  ? Not on file  ?Tobacco Use  ? Smoking status: Never  ? Smokeless tobacco: Never  ?Vaping Use  ? Vaping Use: Never used  ?Substance and Sexual Activity  ? Alcohol use: Not Currently  ?  Comment: states uit around age 41  ? Drug use: Never  ? Sexual activity: Not Currently  ?  Birth control/protection: Surgical  ?  Comment:  vastectomy  ? ? ? ?

## 2021-09-25 ENCOUNTER — Encounter: Payer: Self-pay | Admitting: Gastroenterology

## 2021-10-02 ENCOUNTER — Ambulatory Visit (INDEPENDENT_AMBULATORY_CARE_PROVIDER_SITE_OTHER): Payer: Managed Care, Other (non HMO) | Admitting: Orthopedic Surgery

## 2021-10-02 ENCOUNTER — Ambulatory Visit (INDEPENDENT_AMBULATORY_CARE_PROVIDER_SITE_OTHER): Payer: Managed Care, Other (non HMO)

## 2021-10-02 ENCOUNTER — Encounter: Payer: Self-pay | Admitting: Family Medicine

## 2021-10-02 ENCOUNTER — Encounter: Payer: Self-pay | Admitting: Orthopedic Surgery

## 2021-10-02 DIAGNOSIS — M25512 Pain in left shoulder: Secondary | ICD-10-CM

## 2021-10-02 DIAGNOSIS — M25511 Pain in right shoulder: Secondary | ICD-10-CM

## 2021-10-02 NOTE — Progress Notes (Signed)
? ?Post-Op Visit Note ?  ?Patient: Adam Vaughn           ?Date of Birth: 1975-10-20           ?MRN: [id number] ?Visit Date: 10/02/2021 ?PCP: Plotnikov, Evie Lacks, MD ? ? ?Assessment & Plan: ? ?Chief Complaint:  ?Chief Complaint  ?Patient presents with  ? Right Shoulder - Routine Post Op  ? ?Visit Diagnoses:  ?1. Left shoulder pain, unspecified chronicity   ? ? ?Plan: Patient presents now 3 weeks out right shoulder labral repair and biceps tenodesis.  Overall his shoulder feels better.  On exam he is got good shoulder stability anteriorly and posteriorly.  No Popeye deformity is present.  Plan at this time is to start below shoulder level range of motion exercises with no crossed arm adduction or any posterior capsular stretching.  Hold off on biceps resistive exercises as well.  Okay for deltoid isometrics.  Follow-up in 4 weeks.  Okay to discontinue sling use in 2 weeks. ? ?Follow-Up Instructions: Return in about 4 weeks (around 10/30/2021).  ? ?Orders:  ?Orders Placed This Encounter  ?Procedures  ? XR Shoulder Left  ? ?No orders of the defined types were placed in this encounter. ? ? ?Imaging: ?XR Shoulder Left ? ?Result Date: 10/02/2021 ?AP lateral axillary radiographs left shoulder reviewed.  Slight elevation of the distal clavicle relative to the acromion consistent with grade 2 injury.  Shoulder is located.  No acute fracture.  Acromiohumeral distance maintained.  No glenohumeral joint or AC joint arthritis is present.  Visualized lung fields clear.  ? ?PMFS History: ?Patient Active Problem List  ? Diagnosis Date Noted  ? Bladder neck obstruction 09/04/2021  ? Asthmatic bronchitis 09/04/2021  ? Pain in right shoulder 06/29/2021  ? Meteorism 06/01/2021  ? Sinus congestion 02/28/2021  ? Vertigo 02/17/2021  ? Headache 02/17/2021  ? Fever 02/17/2021  ? Falls 02/17/2021  ? Allergic rhinitis 10/25/2020  ? Superficial phlebitis 10/25/2020  ? Family history of acute polio 10/25/2020  ? Chest pain, atypical  10/10/2020  ? S/P laparoscopic cholecystectomy 10/10/2020  ? Hyperglycemia 10/10/2020  ? Acute cholecystitis 09/30/2020  ? Insomnia 09/26/2020  ? Knee pain 06/23/2020  ? Low vitamin B12 level 05/31/2020  ? Ankle sprain 05/31/2020  ? Constipation by delayed colonic transit 05/31/2020  ? Degenerative spondylolisthesis 04/11/2020  ? 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 DDD (degenerative disc disease), lumbar 01/20/2020  ? Left lumbar radiculitis 01/20/2020  ? Vitamin D deficiency 01/15/2020  ? Low back pain 01/12/2020  ? IBS (irritable bowel syndrome) 01/12/2020  ? Nephrolithiasis 01/12/2020  ? Erectile dysfunction 01/12/2020  ? GERD (gastroesophageal reflux disease) 01/12/2020  ? ?Past Medical History:  ?Diagnosis Date  ? Arthritis   ? Family history of colon cancer   ? sister  ? GERD (gastroesophageal reflux disease)   ? History of kidney stones   ? Hypertension   ? Kidney stones   ?  ?Family History  ?Problem Relation Age of Onset  ? Hypertension Father   ? Heart attack Father   ? Other Father   ?     Small intestine removed but does not know why  ? Diabetes Sister   ? Heart disease Sister   ? Colon cancer Sister   ? Colon polyps Sister   ? Colon polyps Sister   ? Healthy Sister   ? Healthy Sister   ? Healthy Sister   ? Other Brother   ?  MVA  ? Healthy Brother   ? Healthy Brother   ? Healthy Brother   ? Healthy Brother   ? Healthy Brother   ? Healthy Brother   ? Liver disease Neg Hx   ? Pancreatic cancer Neg Hx   ? Esophageal cancer Neg Hx   ? Stomach cancer Neg Hx   ?  ?Past Surgical History:  ?Procedure Laterality Date  ? BACK SURGERY  04/11/2020  ? CHOLECYSTECTOMY N/A 10/01/2020  ? Procedure: LAPAROSCOPIC CHOLECYSTECTOMY;  Surgeon: Jovita Kussmaul, MD;  Location: [facility];  Service: General;  Laterality: N/A;  ? COLONOSCOPY  1991  ? IBS  ? endocolon  09/15/2021  ? FHCC, GERD  ? HAND SURGERY Bilateral   ? TONSILLECTOMY    ? VASECTOMY    ? ?Social History  ? ?Occupational  History  ? Not on file  ?Tobacco Use  ? Smoking status: Never  ? Smokeless tobacco: Never  ?Vaping Use  ? Vaping Use: Never used  ?Substance and Sexual Activity  ? Alcohol use: Not Currently  ?  Comment: states uit around age 81  ? Drug use: Never  ? Sexual activity: Not Currently  ?  Birth control/protection: Surgical  ?  Comment: vastectomy  ? ? ? ?

## 2021-10-02 NOTE — Addendum Note (Signed)
Addended byLaurann Montana on: 10/02/2021 02:31 PM ? ? Modules accepted: Orders ? ?

## 2021-10-05 ENCOUNTER — Encounter: Payer: Self-pay | Admitting: Gastroenterology

## 2021-10-09 ENCOUNTER — Encounter: Payer: Self-pay | Admitting: Internal Medicine

## 2021-10-09 NOTE — Telephone Encounter (Signed)
Pls advise on mychart..Chryl Heck ?

## 2021-10-10 ENCOUNTER — Other Ambulatory Visit: Payer: Self-pay | Admitting: Internal Medicine

## 2021-10-10 MED ORDER — TIZANIDINE HCL 4 MG PO CAPS: 4.0000 mg | ORAL_CAPSULE | Freq: Three times a day (TID) | ORAL | 3 refills | Status: DC

## 2021-10-10 MED ORDER — OXYCODONE HCL 10 MG PO TABS: 10.0000 mg | ORAL_TABLET | Freq: Three times a day (TID) | ORAL | 0 refills | Status: DC

## 2021-10-12 ENCOUNTER — Encounter: Payer: Self-pay | Admitting: Physical Therapy

## 2021-10-12 ENCOUNTER — Ambulatory Visit: Payer: Managed Care, Other (non HMO) | Admitting: Physical Therapy

## 2021-10-12 DIAGNOSIS — M25511 Pain in right shoulder: Secondary | ICD-10-CM | POA: Diagnosis not present

## 2021-10-12 DIAGNOSIS — R293 Abnormal posture: Secondary | ICD-10-CM | POA: Diagnosis not present

## 2021-10-12 DIAGNOSIS — M25611 Stiffness of right shoulder, not elsewhere classified: Secondary | ICD-10-CM | POA: Diagnosis not present

## 2021-10-12 DIAGNOSIS — M6281 Muscle weakness (generalized): Secondary | ICD-10-CM | POA: Diagnosis not present

## 2021-10-12 DIAGNOSIS — R600 Localized edema: Secondary | ICD-10-CM

## 2021-10-12 NOTE — Therapy (Signed)
?OUTPATIENT PHYSICAL THERAPY SHOULDER EVALUATION ? ? ?Patient Name: Adam Vaughn ?MRN: [id number] ?DOB:1976/03/06, 46 y.o., male ?Today's Date: 10/12/2021 ? ? PT End of Session - 10/12/21 1052   ? ? Visit Number 1   ? Number of Visits 16   ? Date for PT Re-Evaluation 12/07/21   ? Authorization Type [employer]   ? PT Start Time 1010   ? PT Stop Time 8299   ? PT Time Calculation (min) 30 min   ? Activity Tolerance Patient tolerated treatment well   ? Behavior During Therapy [facility] for tasks assessed/performed   ? ?  ?  ? ?  ? ? ?Past Medical History:  ?Diagnosis Date  ? Arthritis   ? Family history of colon cancer   ? sister  ? GERD (gastroesophageal reflux disease)   ? History of kidney stones   ? Hypertension   ? Kidney stones   ? ?Past Surgical History:  ?Procedure Laterality Date  ? BACK SURGERY  04/11/2020  ? CHOLECYSTECTOMY N/A 10/01/2020  ? Procedure: LAPAROSCOPIC CHOLECYSTECTOMY;  Surgeon: Jovita Kussmaul, MD;  Location: [facility];  Service: General;  Laterality: N/A;  ? COLONOSCOPY  1991  ? IBS  ? endocolon  09/15/2021  ? FHCC, GERD  ? HAND SURGERY Bilateral   ? TONSILLECTOMY    ? VASECTOMY    ? ?Patient Active Problem List  ? Diagnosis Date Noted  ? Bladder neck obstruction 09/04/2021  ? Asthmatic bronchitis 09/04/2021  ? Pain in right shoulder 06/29/2021  ? Meteorism 06/01/2021  ? Sinus congestion 02/28/2021  ? Vertigo 02/17/2021  ? Headache 02/17/2021  ? Fever 02/17/2021  ? Falls 02/17/2021  ? Allergic rhinitis 10/25/2020  ? Superficial phlebitis 10/25/2020  ? Family history of acute polio 10/25/2020  ? Chest pain, atypical 10/10/2020  ? S/P laparoscopic cholecystectomy 10/10/2020  ? Hyperglycemia 10/10/2020  ? Acute cholecystitis 09/30/2020  ? Insomnia 09/26/2020  ? Knee pain 06/23/2020  ? Low vitamin B12 level 05/31/2020  ? Ankle sprain 05/31/2020  ? Constipation by delayed colonic transit 05/31/2020  ? Degenerative spondylolisthesis 04/11/2020  ? Spinal stenosis of lumbar region with neurogenic claudication  02/14/2020  ? DDD (degenerative disc disease), lumbar 01/20/2020  ? Left lumbar radiculitis 01/20/2020  ? Vitamin D deficiency 01/15/2020  ? Low back pain 01/12/2020  ? IBS (irritable bowel syndrome) 01/12/2020  ? Nephrolithiasis 01/12/2020  ? Erectile dysfunction 01/12/2020  ? GERD (gastroesophageal reflux disease) 01/12/2020  ? ? ?PCP: Plotnikov, Evie Lacks, MD ? ?REFERRING PROVIDER: Meredith Pel, MD ? ?REFERRING DIAG: M25.511 (ICD-10-CM) - Right shoulder pain, unspecified chronicity  ? ?THERAPY DIAG:  ?Acute pain of right shoulder - Plan: PT plan of care cert/re-cert ? ?Stiffness of right shoulder, not elsewhere classified - Plan: PT plan of care cert/re-cert ? ?Muscle weakness (generalized) - Plan: PT plan of care cert/re-cert ? ?Abnormal posture - Plan: PT plan of care cert/re-cert ? ?Localized edema - Plan: PT plan of care cert/re-cert ? ? ?ONSET DATE: 09/11/21 ? ?NEXT MD: 10/30/21 ? ?SUBJECTIVE:                                                                                                                                                                                     ? ?  SUBJECTIVE STATEMENT: ?Pt is a 46 y/o male who presents to [facility] s/p Rt shoulder biceps tenodesis, labral repair and debridement on 09/11/21. ? ?PERTINENT HISTORY: ?OA, HTN, back surgery 2021 ? ?PAIN:  ?Are you having pain? Yes: NPRS scale: 3, up to 10/10 ?Pain location: Rt shoulder ?Pain description: spasm, dull ache ?Aggravating factors: increased activity, stretched to end ranges ?Relieving factors: muscle relaxers, oxycodone, aleve ? ?PRECAUTIONS: Shoulder Plan at this time is to start below shoulder level range of motion exercises with no crossed arm adduction or any posterior capsular stretching. Hold off on biceps resistive exercises as well. Okay for deltoid isometrics.  ? ?WEIGHT BEARING RESTRICTIONS Yes NWB RUE ? ?FALLS:  ?Has patient fallen in last 6 months? Yes. Number of falls 2 ? ?LIVING ENVIRONMENT: ?Lives with: lives with  their spouse and lives with their daughter (81 y/o - has some special needs and needs physical assistance) ?Lives in: House/apartment ? ? ?OCCUPATION: ?Out of work since back surgery, Nov 2021 ? ?PLOF: Independent and Leisure: spend time with family, no regular exercise ? ?PATIENT GOALS improve use of RUE ? ?OBJECTIVE:  ? ?PATIENT SURVEYS:  ?10/12/21: FOTO 48 (predicted 68) ? ?COGNITION: ? Overall cognitive status: Within functional limits for tasks assessed ?    ?POSTURE: ?Rounded shoulders ? ?UPPER EXTREMITY ROM:  ? ?Passive ROM Right ?10/12/2021 Left ?10/12/2021  ?Shoulder flexion 90   ?Shoulder extension    ?Shoulder abduction 90   ?Shoulder adduction    ?Shoulder internal rotation 60 (in 55 deg abdct)   ?Shoulder external rotation 40 ?(In 55 deg abdct)   ?Elbow flexion    ?Elbow extension    ?Wrist flexion    ?Wrist extension    ?Wrist ulnar deviation    ?Wrist radial deviation    ?Wrist pronation    ?Wrist supination    ?(Blank rows = not tested) ? ?Active ROM Right ?10/12/2021 Left ?10/12/2021  ?Shoulder flexion    ?Shoulder extension    ?Shoulder abduction    ?Shoulder adduction    ?Shoulder internal rotation    ?Shoulder external rotation    ?Elbow flexion    ?Elbow extension    ?Wrist flexion    ?Wrist extension    ?Wrist ulnar deviation    ?Wrist radial deviation    ?Wrist pronation    ?Wrist supination    ?(Blank rows = not tested) ? ?UPPER EXTREMITY MMT: deferred due to post op status ? ?MMT Right ?10/12/2021 Left ?10/12/2021  ?Shoulder flexion    ?Shoulder extension    ?Shoulder abduction    ?Shoulder adduction    ?Shoulder internal rotation    ?Shoulder external rotation    ?Middle trapezius    ?Lower trapezius    ?Elbow flexion    ?Elbow extension    ?Wrist flexion    ?Wrist extension    ?Wrist ulnar deviation    ?Wrist radial deviation    ?Wrist pronation    ?Wrist supination    ?Grip strength (lbs)    ?(Blank rows = not tested) ? ?  ?TODAY'S TREATMENT: ?10/12/12 ? Therex: See pt instructions, did change  standing pendulums to sitting due to back pain and buckling observed in clinic needing to reach for support with LUE.  Performed deltoid isometrics with LUE support or wall support. ? ?PATIENT EDUCATION: ?Education details: HEP ?Person educated: Patient ?Education method: Explanation, Demonstration, and Handouts ?Education comprehension: verbalized understanding, returned demonstration, and needs further education ? ? ?HOME EXERCISE PROGRAM: ?Access Code: [email or web address] ?URL: [email or web address] ?Date: 10/12/2021 ?  Prepared by: Faustino Congress ? ?Exercises ?- Standing Circular Shoulder Pendulum Supported with Arm Bent  - 3-5 x daily - 7 x weekly - 1-2 sets - 10 reps ?- Standing Horizontal Shoulder Pendulum Supported with Arm Bent  - 3-5 x daily - 7 x weekly - 1-2 sets - 10 reps ?- Isometric Shoulder Abduction at Wall  - 3-5 x daily - 7 x weekly - 1-2 sets - 10 reps - 5 sec hold ?- Standing Isometric Shoulder Extension with Doorway - Arm Bent  - 3-5 x daily - 7 x weekly - 1-2 sets - 10 reps - 5 sec hold ?- Isometric Shoulder Flexion at Wall  - 1 x daily - 7 x weekly - 3 sets - 10 reps - 5 sec hold ? ?ASSESSMENT: ? ?CLINICAL IMPRESSION: ?Patient is a 46 y.o. male who was seen today for physical therapy evaluation and treatment for s/p Rt shoulder arthroscopy, debridement, biceps tenodesis, and posterior labral repair. He demonstrates decreased ROM and strength with expected post-op pain affecting functional mobility.  Pt will benefit from PT to address deficits listed.  ? ? ?OBJECTIVE IMPAIRMENTS decreased ROM, decreased strength, hypomobility, increased fascial restrictions, increased muscle spasms, impaired UE functional use, and pain.  ? ?ACTIVITY LIMITATIONS cleaning, community activity, driving, meal prep, and laundry.  ? ?PERSONAL FACTORS 3+ comorbidities: OA, HTN, hx back surgery Nov 2021  are also affecting patient's functional outcome.  ? ? ?REHAB POTENTIAL: Good ? ?CLINICAL DECISION MAKING:  Evolving/moderate complexity ? ?EVALUATION COMPLEXITY: Moderate ? ? ?GOALS: ?Goals reviewed with patient? Yes ? ?SHORT TERM GOALS: Target date: 11/09/2021 ? ?Independent with initial HEP ?Goal status: INITIAL ?

## 2021-10-19 ENCOUNTER — Ambulatory Visit: Payer: Self-pay

## 2021-10-19 ENCOUNTER — Ambulatory Visit: Payer: Managed Care, Other (non HMO) | Admitting: Family Medicine

## 2021-10-19 ENCOUNTER — Encounter: Payer: Self-pay | Admitting: Family Medicine

## 2021-10-19 VITALS — BP 150/100 | HR 89 | Ht 68.0 in | Wt 203.8 lb

## 2021-10-19 DIAGNOSIS — M25561 Pain in right knee: Secondary | ICD-10-CM | POA: Diagnosis not present

## 2021-10-19 DIAGNOSIS — G8929 Other chronic pain: Secondary | ICD-10-CM | POA: Diagnosis not present

## 2021-10-19 DIAGNOSIS — M170 Bilateral primary osteoarthritis of knee: Secondary | ICD-10-CM | POA: Diagnosis not present

## 2021-10-19 DIAGNOSIS — M25562 Pain in left knee: Secondary | ICD-10-CM

## 2021-10-19 DIAGNOSIS — M5416 Radiculopathy, lumbar region: Secondary | ICD-10-CM | POA: Diagnosis not present

## 2021-10-19 NOTE — Progress Notes (Signed)
? ?I, Adam Vaughn, LAT, ATC, am serving as scribe for Dr. Lynne Vaughn. ? ?Adam Vaughn is a 46 y.o. 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 Pt was last seen by Dr. Georgina Vaughn on 08/14/21 and completed the [facility] series bilaterally. Today, pt reports that the gel injections helped for a few weeks but have flared up since then. His L knee seems worse than the R.  He locates his pain to more under his patella but he is also reporting some L post-lat knee pain.   ? ?He also has a long history of lumbar radiculopathy.  He previously was getting care at [facility] but would like to consolidate his care if possible. ? ?Dx imaging: 06/28/20 R and L knee XR ? ?Pertinent review of systems: no fever or chills ? ?Relevant historical information: Recent shoulder surgery . ? ? ?Exam:  ?BP (!) 150/100 (BP Location: Left Arm, Patient Position: Sitting, Cuff Size: Normal)   Pulse 89   Ht '5\' 8"'$  (1.727 m)   Wt 203 lb 12.8 oz (92.4 kg)   SpO2 95%   BMI 30.99 kg/m?  ?General: Well Developed, well nourished, and in no acute distress.  ? ?MSK: Right knee mild effusion normal motion with crepitation. ? ?Left knee mild effusion normal motion with crepitation. ? ? ? ?Lab and Radiology Results ? ?Procedure: Real-time Ultrasound Guided Injection of right knee superior lateral patellar space ?Device: Philips Affiniti 50G ?Images permanently stored and available for review in PACS ?Verbal informed consent obtained.  Discussed risks and benefits of procedure. Warned about infection, bleeding, hyperglycemia damage to structures among others. ?Patient expresses understanding and agreement ?Time-out conducted.   ?Noted no overlying erythema, induration, or other signs of local infection.   ?Skin prepped in a sterile fashion.   ?Local anesthesia: Topical Ethyl chloride.   ?With sterile technique and under real time ultrasound  guidance: 40 mg of Kenalog and 2 mL of Marcaine injected into knee joint. Fluid seen entering the joint capsule.   ?Completed without difficulty   ?Pain immediately resolved suggesting accurate placement of the medication.   ?Advised to call if fevers/chills, erythema, induration, drainage, or persistent bleeding.   ?Images permanently stored and available for review in the ultrasound unit.  ?Impression: Technically successful ultrasound guided injection. ? ? ? ?Procedure: Real-time Ultrasound Guided Injection of left knee superior lateral patellar space ?Device: Philips Affiniti 50G ?Images permanently stored and available for review in PACS ?Verbal informed consent obtained.  Discussed risks and benefits of procedure. Warned about infection, bleeding, hyperglycemia damage to structures among others. ?Patient expresses understanding and agreement ?Time-out conducted.   ?Noted no overlying erythema, induration, or other signs of local infection.   ?Skin prepped in a sterile fashion.   ?Local anesthesia: Topical Ethyl chloride.   ?With sterile technique and under real time ultrasound guidance: 40 mg of Kenalog and 2 mL of Marcaine injected into knee joint. Fluid seen entering the joint capsule.   ?Completed without difficulty   ?Pain immediately resolved suggesting accurate placement of the medication.   ?Advised to call if fevers/chills, erythema, induration, drainage, or persistent bleeding.   ?Images permanently stored and available for review in the ultrasound unit.  ?Impression: Technically successful ultrasound guided injection. ? ? ?EXAM: ?MRI LUMBAR SPINE WITHOUT AND WITH CONTRAST ? ?TECHNIQUE: ?Multiplanar and multiecho pulse sequences of the lumbar spine were ?obtained without and with intravenous contrast. ? ?CONTRAST:  19 mL of MultiHance IV. ? ?COMPARISON: MRI 02/13/2020. ? ?FINDINGS: ?Segmentation: Standard segmentation is assumed. The inferior-most ?fully formed intervertebral disc is labeled L5-S1.  This is ?consistent with prior MRI numbering. ? ?Alignment: Straightening of the normal lumbar lordosis with slight ?retrolisthesis of L3 on L4, similar to prior. ? ?Vertebrae: Scattered vertebral venous malformations (hemangiomas), ?largest at L1. No specific evidence of acute fracture, ?discitis/osteomyelitis, or suspicious bone lesion. ? ?Conus medullaris and cauda equina: Conus extends to the L1 level. ?Conus appears normal. No abnormal enhancement of the conus or cauda ?equina. ? ?Paraspinal and other soft tissues: Postoperative changes in the ?lower posterior paraspinal soft tissues. ? ?Disc levels: ? ?T12-L1: No significant disc protrusion, foraminal stenosis, or canal ?stenosis. ? ?L1-L2: No significant disc protrusion, foraminal stenosis, or canal ?stenosis. ? ?L2-L3: Mild disc bulge and mild bilateral facet hypertrophy without ?significant canal or foraminal stenosis. Findings are similar to ?prior. ? ?L3-L4: Degenerative disc height loss and desiccation. Similar mild ?retrolisthesis of L3 on L4. Broad disc bulge and mild bilateral ?facet hypertrophy with suspected underlying short pedicles. Similar ?mild to moderate bilateral foraminal stenosis. Similar mild canal ?stenosis. ? ?L4-L5: Posterior fusion and decompression. Enhancing scar tissue in ?the lateral and dorsal epidural canal. Improved canal stenosis with ?patent canal. Foramina also appear patent. ? ?L5-S1: Posterior fusion and decompression. Enhancing scar tissue in ?the lateral and dorsal epidural canal. Improved canal stenosis with ?patent canal. Foramina also appear patent. ? ?IMPRESSION: ?1. L4-S1 fusion with posterior decompression. Patent canal and ?foramina at these levels. ?2. Adjacent level degenerative change at L3-L4 with similar mild ?canal stenosis and mild to moderate bilateral foraminal stenosis. ? ? ?Electronically Signed ?By: Margaretha Sheffield MD ?On: 01/19/2021 10:35  ?I, Adam Vaughn, personally (independently) visualized and  performed the interpretation of the images attached in this note. ? ? ? ? ? ?Assessment and Plan: ?46 y.o. male with bilateral knee pain due to DJD exacerbation.  Plan for steroid injections today.  He previously had hyaluronic acid injections that did not work as well as he would like.  Hopefully the extra steroid injections will help. ? ?Additionally he has lumbar radiculopathy thought to be bilateral L4 left worse than right.  Plan for epidural steroid injection ordered today agrees for imaging.  I did find an MRI of the lumbar spine in PACS system from July 2022 that is recently up-to-date. ? ?Recheck back with me as needed. ? ? ?PDMP not reviewed this encounter. ?Orders Placed This Encounter  ?Procedures  ? [place] LIMITED JOINT SPACE STRUCTURES LOW BILAT(NO LINKED CHARGES)  ?  Order Specific Question:   Reason for Exam (SYMPTOM  OR DIAGNOSIS REQUIRED)  ?  Answer:   B knee pain  ?  Order Specific Question:   Preferred imaging location?  ?  Answer:   [place]  ? DG INJECT DIAG/THERA/INC NEEDLE/CATH/PLC EPI/LUMB/SAC W/IMG  ?  Lumbar epidural.  Lumbar radiculopathy w/ B LE symptoms.  Level and technique per radiology.  ?  Standing Status:   Future  ?  Standing Expiration Date:   10/20/2022  ?  Scheduling Instructions:  ?   Lumbar epidural.  Lumbar radiculopathy w/ B LE symptoms.  Level and technique per radiology.  ?  Order Specific Question:   Reason for Exam (SYMPTOM  OR DIAGNOSIS REQUIRED)  ?  Answer:   lumbar radiculopathy  ?  Order Specific Question:   Preferred Imaging Location?  ?  Answer:   [facility]  ? ?No  orders of the defined types were placed in this encounter. ? ? ? ?Discussed warning signs or symptoms. Please see discharge instructions. Patient expresses understanding. ? ? ?The above documentation has been reviewed and is accurate and complete Adam Vaughn, M.D. ? ? ?

## 2021-10-19 NOTE — Patient Instructions (Addendum)
Good to see you today. ? ?You had B knee injections.  Call or go to the ER if you develop a large red swollen joint with extreme pain or oozing puss.  ? ?I've ordered a lumbar epidural.  Please call [place] at [phone] or [phone]. ? ?Follow-up: as needed ?

## 2021-10-20 ENCOUNTER — Other Ambulatory Visit: Payer: Self-pay | Admitting: *Deleted

## 2021-10-20 NOTE — Telephone Encounter (Signed)
Rec'd fax for PA on Oxycodone '10mg'$  w/(Key: [id number]). Rec'd msg STATING Your information has been submitted and will be reviewed by [place]. You may close this dialog, return to your dashboard, and perform other tasks. An electronic determination will be received in CoverMyMeds within 72-120 hours.".[name]/LMB ?

## 2021-10-24 NOTE — Discharge Instructions (Signed)

## 2021-10-25 ENCOUNTER — Ambulatory Visit
Admission: RE | Admit: 2021-10-25 | Discharge: 2021-10-25 | Disposition: A | Discharge status: Home / Self Care | Payer: Managed Care, Other (non HMO) | Source: Ambulatory Visit | Attending: Family Medicine | Admitting: Family Medicine

## 2021-10-25 DIAGNOSIS — M5416 Radiculopathy, lumbar region: Secondary | ICD-10-CM

## 2021-10-25 MED ORDER — METHYLPREDNISOLONE ACETATE 40 MG/ML INJ SUSP (RADIOLOG
80.0000 mg | Freq: Once | INTRAMUSCULAR | Status: AC
  Administered 2021-10-25: 80 mg via EPIDURAL

## 2021-10-25 MED ORDER — IOPAMIDOL (ISOVUE-M 200) INJECTION 41%
1.0000 mL | Freq: Once | INTRAMUSCULAR | Status: AC
  Administered 2021-10-25: 1 mL via EPIDURAL

## 2021-10-25 NOTE — Telephone Encounter (Signed)
Check status on PA.. rec'd msg stating med " This request has received a favorable outcome. Case XN:[id number]; Start Date:10/20/2021;Coverage End Date:10/25/2022". Fax info to pof w/ approval.../l,b ?

## 2021-10-26 ENCOUNTER — Encounter: Payer: Self-pay | Admitting: Physical Therapy

## 2021-10-26 ENCOUNTER — Ambulatory Visit: Payer: Managed Care, Other (non HMO) | Admitting: Physical Therapy

## 2021-10-26 DIAGNOSIS — M25611 Stiffness of right shoulder, not elsewhere classified: Secondary | ICD-10-CM

## 2021-10-26 DIAGNOSIS — R293 Abnormal posture: Secondary | ICD-10-CM | POA: Diagnosis not present

## 2021-10-26 DIAGNOSIS — M25511 Pain in right shoulder: Secondary | ICD-10-CM

## 2021-10-26 DIAGNOSIS — R600 Localized edema: Secondary | ICD-10-CM

## 2021-10-26 DIAGNOSIS — M6281 Muscle weakness (generalized): Secondary | ICD-10-CM | POA: Diagnosis not present

## 2021-10-26 NOTE — Therapy (Signed)
?OUTPATIENT PHYSICAL THERAPY TREATMENT NOTE ? ? ?Patient Name: Adam Vaughn ?MRN: [id number] ?DOB:[birth year], 46 y.o., male ?Today's Date: 10/26/2021 ? ?PCP: Plotnikov, Evie Lacks, MD ?REFERRING PROVIDER: Meredith Pel, MD ? ?END OF SESSION:  ? PT End of Session - 10/26/21 1058   ? ? Visit Number 2   ? Number of Visits 16   ? Date for PT Re-Evaluation 12/07/21   ? Authorization Type [employer]   ? PT Start Time 1013   ? PT Stop Time 1048   ? PT Time Calculation (min) 35 min   ? Activity Tolerance Patient tolerated treatment well   ? Behavior During Therapy [facility] for tasks assessed/performed   ? ?  ?  ? ?  ? ? ?Past Medical History:  ?Diagnosis Date  ? Arthritis   ? Family history of colon cancer   ? sister  ? GERD (gastroesophageal reflux disease)   ? History of kidney stones   ? Hypertension   ? Kidney stones   ? ?Past Surgical History:  ?Procedure Laterality Date  ? BACK SURGERY  04/11/2020  ? CHOLECYSTECTOMY N/A 10/01/2020  ? Procedure: LAPAROSCOPIC CHOLECYSTECTOMY;  Surgeon: Jovita Kussmaul, MD;  Location: [facility];  Service: General;  Laterality: N/A;  ? COLONOSCOPY  1991  ? IBS  ? endocolon  09/15/2021  ? FHCC, GERD  ? HAND SURGERY Bilateral   ? TONSILLECTOMY    ? VASECTOMY    ? ?Patient Active Problem List  ? Diagnosis Date Noted  ? Bladder neck obstruction 09/04/2021  ? Asthmatic bronchitis 09/04/2021  ? Pain in right shoulder 06/29/2021  ? Meteorism 06/01/2021  ? Sinus congestion 02/28/2021  ? Vertigo 02/17/2021  ? Headache 02/17/2021  ? Fever 02/17/2021  ? Falls 02/17/2021  ? Allergic rhinitis 10/25/2020  ? Superficial phlebitis 10/25/2020  ? Family history of acute polio 10/25/2020  ? Chest pain, atypical 10/10/2020  ? S/P laparoscopic cholecystectomy 10/10/2020  ? Hyperglycemia 10/10/2020  ? Acute cholecystitis 09/30/2020  ? Insomnia 09/26/2020  ? Knee pain 06/23/2020  ? Low vitamin B12 level 05/31/2020  ? Ankle sprain 05/31/2020  ? Constipation by delayed colonic transit 05/31/2020  ? Degenerative  spondylolisthesis 04/11/2020  ? 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 DDD (degenerative disc disease), lumbar 01/20/2020  ? Left lumbar radiculitis 01/20/2020  ? Vitamin D deficiency 01/15/2020  ? Low back pain 01/12/2020  ? IBS (irritable bowel syndrome) 01/12/2020  ? Nephrolithiasis 01/12/2020  ? Erectile dysfunction 01/12/2020  ? GERD (gastroesophageal reflux disease) 01/12/2020  ? ? ?REFERRING DIAG: M25.511 (ICD-10-CM) - Right shoulder pain, unspecified chronicity  ? ?THERAPY DIAG:  ?Acute pain of right shoulder ? ?Stiffness of right shoulder, not elsewhere classified ? ?Muscle weakness (generalized) ? ?Abnormal posture ? ?Localized edema ? ?PERTINENT HISTORY: OA, HTN, back surgery 2021 ? ?PRECAUTIONS: Fall; Shoulder Plan at this time is to start below shoulder level range of motion exercises with no crossed arm adduction or any posterior capsular stretching. Hold off on biceps resistive exercises as well. Okay for deltoid isometrics.  ? ?SUBJECTIVE: shoulder is doing well; can't do pendulums due to back more than shoulder ? ?PAIN:  ?Are you having pain? Yes: NPRS scale: 3, up to 5/10 ?Pain location: Rt shoulder ?Pain description: sore aching ?Aggravating factors: when he moves it different ways ?Relieving factors: rest ? ? ?OBJECTIVE: (objective measures completed at initial evaluation unless otherwise dated) ? ? ?OBJECTIVE:  ?  ?PATIENT SURVEYS:  ?10/12/21: FOTO 48 (predicted 68) ?                                 ?  POSTURE: ?Rounded shoulders ?  ?UPPER EXTREMITY ROM:  ?  ?Passive ROM Right ?10/12/2021 Left ?10/12/2021  ?Shoulder flexion 90    ?Shoulder extension      ?Shoulder abduction 90    ?Shoulder adduction      ?Shoulder internal rotation 60 (in 55 deg abdct)    ?Shoulder external rotation 40 ?(In 55 deg abdct)    ?Elbow flexion      ?Elbow extension      ?Wrist flexion      ?Wrist extension      ?Wrist ulnar deviation      ?Wrist radial deviation      ?Wrist pronation       ?Wrist supination      ?(Blank rows = not tested) ?  ?Active ROM Right ?10/12/2021 Left ?10/12/2021  ?Shoulder flexion 90     ?Shoulder extension      ?Shoulder abduction 90     ?Shoulder adduction      ?Shoulder internal rotation      ?Shoulder external rotation      ?Elbow flexion      ?Elbow extension      ?Wrist flexion      ?Wrist extension      ?Wrist ulnar deviation      ?Wrist radial deviation      ?Wrist pronation      ?Wrist supination      ?(Blank rows = not tested) ?  ?UPPER EXTREMITY MMT: deferred due to post op status ?  ?MMT Right ?10/12/2021 Left ?10/12/2021  ?Shoulder flexion      ?Shoulder extension      ?Shoulder abduction      ?Shoulder adduction      ?Shoulder internal rotation      ?Shoulder external rotation      ?Middle trapezius      ?Lower trapezius      ?Elbow flexion      ?Elbow extension      ?Wrist flexion      ?Wrist extension      ?Wrist ulnar deviation      ?Wrist radial deviation      ?Wrist pronation      ?Wrist supination      ?Grip strength (lbs)      ?(Blank rows = not tested) ?  ?            ?TODAY'S TREATMENT: ?10/26/21 ?Therex: ?     Sitting: ?Sustained flexion hold at 90 deg 3x30 sec, Rt ?Sustained abduction hold at 90 deg on Rt;  3x30 sec ?Seated AA flexion Rt shoulder ?Discussed HEP, will remove pendulums as pt is unable due to back. ?  ?Self-Care: ?Reinforced protocol restrictions including nothing above shoulder height and no cross body adduction.  Pt verbalized understanding. ?Manual: ?Rt shoulder PROM all motions within protocol restrictions. ? ? ? ? ?10/12/21 ?              Therex: See pt instructions, did change standing pendulums to sitting due to back pain and buckling observed in clinic needing to reach for support with LUE.  Performed deltoid isometrics with LUE support or wall support. ?  ?PATIENT EDUCATION: ?Education details: HEP ?Person educated: Patient ?Education method: Explanation, Demonstration, and Handouts ?Education comprehension: verbalized  understanding, returned demonstration, and needs further education ?  ?  ?HOME EXERCISE PROGRAM: ?Access Code: [email or web address] ?URL: [email or web address] ?Date: 10/12/2021 ?Prepared by: Faustino Congress ?  ?Exercises ?- Standing Circular Shoulder Pendulum Supported with Arm Bent  - 3-5 x  daily - 7 x weekly - 1-2 sets - 10 reps ?- Standing Horizontal Shoulder Pendulum Supported with Arm Bent  - 3-5 x daily - 7 x weekly - 1-2 sets - 10 reps ?- Isometric Shoulder Abduction at Wall  - 3-5 x daily - 7 x weekly - 1-2 sets - 10 reps - 5 sec hold ?- Standing Isometric Shoulder Extension with Doorway - Arm Bent  - 3-5 x daily - 7 x weekly - 1-2 sets - 10 reps - 5 sec hold ?- Isometric Shoulder Flexion at Wall  - 1 x daily - 7 x weekly - 3 sets - 10 reps - 5 sec hold ?  ?ASSESSMENT: ?  ?CLINICAL IMPRESSION: ?Pt doing well at this time within the limits of his protocol.  He sees the MD next week to determine if he's able to progress to overhead activities.  Will continue to benefit from PT to maximize function. ? ?  ?OBJECTIVE IMPAIRMENTS decreased ROM, decreased strength, hypomobility, increased fascial restrictions, increased muscle spasms, impaired UE functional use, and pain.  ?  ?ACTIVITY LIMITATIONS cleaning, community activity, driving, meal prep, and laundry.  ?  ?PERSONAL FACTORS 3+ comorbidities: OA, HTN, hx back surgery Nov 2021  are also affecting patient's functional outcome.  ?  ?  ?REHAB POTENTIAL: Good ?  ?CLINICAL DECISION MAKING: Evolving/moderate complexity ?  ?EVALUATION COMPLEXITY: Moderate ?  ?  ?GOALS: ?Goals reviewed with patient? Yes ?  ?SHORT TERM GOALS: Target date: 11/09/2021 ?  ?Independent with initial HEP ?Goal status: INITIAL ?  ?2.  Rt shoulder active flexion and abduction to 90 deg for improved function ?Goal status: INITIAL ?  ?  ?LONG TERM GOALS: Target date: 12/07/2021 ?  ?Independent with final HEP ?Goal status: INITIAL ?  ?2.  FOTO score improved to 68 ?Goal status: INITIAL ?  ?3.   Rt shoulder AROM improved to [facility] for improved mobility and function ?Goal status: INIITAL ?  ?4.  Report pain < 3/10 with activity for improved function ?Goal status: INITIAL ?  ?5.  Rt shoulder strength at least 4/5

## 2021-10-30 ENCOUNTER — Ambulatory Visit (INDEPENDENT_AMBULATORY_CARE_PROVIDER_SITE_OTHER): Payer: Managed Care, Other (non HMO) | Admitting: Orthopedic Surgery

## 2021-10-30 ENCOUNTER — Ambulatory Visit: Payer: Self-pay

## 2021-10-30 ENCOUNTER — Encounter: Payer: Self-pay | Admitting: Orthopedic Surgery

## 2021-10-30 DIAGNOSIS — M25552 Pain in left hip: Secondary | ICD-10-CM

## 2021-10-30 DIAGNOSIS — M25551 Pain in right hip: Secondary | ICD-10-CM

## 2021-10-30 NOTE — Progress Notes (Signed)
? ?Post-Op Visit Note ?  ?Patient: Adam Vaughn           ?Date of Birth: 14-Jun-1976           ?MRN: [id number] ?Visit Date: 10/30/2021 ?PCP: Plotnikov, Evie Lacks, MD ? ? ?Assessment & Plan: ? ?Chief Complaint:  ?Chief Complaint  ?Patient presents with  ? Right Shoulder - Routine Post Op  ? ?Visit Diagnoses:  ?1. Bilateral hip pain   ? ? ?Plan: Patient presents now about 6 to 7 weeks out right shoulder surgery for biceps tenodesis and labral repair.  Overall he has been doing well.  Spasms in the biceps has been improving.  He is not yet back to work.  Has some bilateral lower extremity issues likely related to his back surgery.  He has had injections in his back and hands surgery done by Dr. Trenton Gammon several years ago.  Bilateral hip radiographs today show no significant arthritis in either hip joint.  Plan at this time is okay to start strengthening of the rotator cuff and biceps at this time but in a very gentle and graduated manner.  6-week return for shoulder motion recheck.  Today his passive motion is 30/80/160.  Biceps contour looks good. ? ?Follow-Up Instructions: No follow-ups on file.  ? ?Orders:  ?Orders Placed This Encounter  ?Procedures  ? XR HIPS BILAT W OR W/O PELVIS 3-4 VIEWS  ? ?No orders of the defined types were placed in this encounter. ? ? ?Imaging: ?No results found. ? ?PMFS History: ?Patient Active Problem List  ? Diagnosis Date Noted  ? Bladder neck obstruction 09/04/2021  ? Asthmatic bronchitis 09/04/2021  ? Pain in right shoulder 06/29/2021  ? Meteorism 06/01/2021  ? Sinus congestion 02/28/2021  ? Vertigo 02/17/2021  ? Headache 02/17/2021  ? Fever 02/17/2021  ? Falls 02/17/2021  ? Allergic rhinitis 10/25/2020  ? Superficial phlebitis 10/25/2020  ? Family history of acute polio 10/25/2020  ? Chest pain, atypical 10/10/2020  ? S/P laparoscopic cholecystectomy 10/10/2020  ? Hyperglycemia 10/10/2020  ? Acute cholecystitis 09/30/2020  ? Insomnia 09/26/2020  ? Knee pain 06/23/2020  ? Low  vitamin B12 level 05/31/2020  ? Ankle sprain 05/31/2020  ? Constipation by delayed colonic transit 05/31/2020  ? Degenerative spondylolisthesis 04/11/2020  ? 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 DDD (degenerative disc disease), lumbar 01/20/2020  ? Left lumbar radiculitis 01/20/2020  ? Vitamin D deficiency 01/15/2020  ? Low back pain 01/12/2020  ? IBS (irritable bowel syndrome) 01/12/2020  ? Nephrolithiasis 01/12/2020  ? Erectile dysfunction 01/12/2020  ? GERD (gastroesophageal reflux disease) 01/12/2020  ? ?Past Medical History:  ?Diagnosis Date  ? Arthritis   ? Family history of colon cancer   ? sister  ? GERD (gastroesophageal reflux disease)   ? History of kidney stones   ? Hypertension   ? Kidney stones   ?  ?Family History  ?Problem Relation Age of Onset  ? Hypertension Father   ? Heart attack Father   ? Other Father   ?     Small intestine removed but does not know why  ? Diabetes Sister   ? Heart disease Sister   ? Colon cancer Sister   ? Colon polyps Sister   ? Colon polyps Sister   ? Healthy Sister   ? Healthy Sister   ? Healthy Sister   ? Other Brother   ?     MVA  ? Healthy Brother   ? Healthy Brother   ?  Healthy Brother   ? Healthy Brother   ? Healthy Brother   ? Healthy Brother   ? Liver disease Neg Hx   ? Pancreatic cancer Neg Hx   ? Esophageal cancer Neg Hx   ? Stomach cancer Neg Hx   ?  ?Past Surgical History:  ?Procedure Laterality Date  ? BACK SURGERY  04/11/2020  ? CHOLECYSTECTOMY N/A 10/01/2020  ? Procedure: LAPAROSCOPIC CHOLECYSTECTOMY;  Surgeon: Jovita Kussmaul, MD;  Location: [facility];  Service: General;  Laterality: N/A;  ? COLONOSCOPY  1991  ? IBS  ? endocolon  09/15/2021  ? FHCC, GERD  ? HAND SURGERY Bilateral   ? TONSILLECTOMY    ? VASECTOMY    ? ?Social History  ? ?Occupational History  ? Not on file  ?Tobacco Use  ? Smoking status: Never  ? Smokeless tobacco: Never  ?Vaping Use  ? Vaping Use: Never used  ?Substance and Sexual Activity  ? Alcohol use:  Not Currently  ?  Comment: states uit around age 18  ? Drug use: Never  ? Sexual activity: Not Currently  ?  Birth control/protection: Surgical  ?  Comment: vastectomy  ? ? ? ?

## 2021-10-31 ENCOUNTER — Encounter: Payer: Self-pay | Admitting: Orthopedic Surgery

## 2021-11-01 ENCOUNTER — Ambulatory Visit: Payer: Managed Care, Other (non HMO) | Admitting: Gastroenterology

## 2021-11-01 ENCOUNTER — Other Ambulatory Visit: Payer: Self-pay | Admitting: *Deleted

## 2021-11-01 ENCOUNTER — Ambulatory Visit: Payer: Managed Care, Other (non HMO) | Admitting: Physical Therapy

## 2021-11-01 ENCOUNTER — Encounter: Payer: Self-pay | Admitting: Gastroenterology

## 2021-11-01 ENCOUNTER — Encounter: Payer: Self-pay | Admitting: Physical Therapy

## 2021-11-01 VITALS — BP 118/70 | HR 88 | Ht 68.0 in | Wt 201.4 lb

## 2021-11-01 DIAGNOSIS — M25611 Stiffness of right shoulder, not elsewhere classified: Secondary | ICD-10-CM

## 2021-11-01 DIAGNOSIS — R293 Abnormal posture: Secondary | ICD-10-CM

## 2021-11-01 DIAGNOSIS — K589 Irritable bowel syndrome without diarrhea: Secondary | ICD-10-CM

## 2021-11-01 DIAGNOSIS — M6281 Muscle weakness (generalized): Secondary | ICD-10-CM

## 2021-11-01 DIAGNOSIS — M25511 Pain in right shoulder: Secondary | ICD-10-CM

## 2021-11-01 DIAGNOSIS — R600 Localized edema: Secondary | ICD-10-CM

## 2021-11-01 MED ORDER — CITRUCEL PO POWD: 1.0000 | Freq: Every day | ORAL | Status: AC

## 2021-11-01 MED ORDER — DICYCLOMINE HCL 10 MG PO CAPS: ORAL_CAPSULE | ORAL | 3 refills | Status: DC

## 2021-11-01 MED ORDER — TIZANIDINE HCL 4 MG PO CAPS
4.0000 mg | ORAL_CAPSULE | Freq: Three times a day (TID) | ORAL | 0 refills | Status: DC
Start: 2021-11-01 — End: 2022-07-12

## 2021-11-01 MED ORDER — ALBUTEROL SULFATE HFA 108 (90 BASE) MCG/ACT IN AERS: 2.0000 | INHALATION_SPRAY | Freq: Four times a day (QID) | RESPIRATORY_TRACT | 1 refills | Status: DC | PRN

## 2021-11-01 NOTE — Patient Instructions (Signed)
If you are age 46 or younger, your body mass index should be between 19-25. Your Body mass index is 30.62 kg/m?[name] If this is out of the aformentioned range listed, please consider follow up with your Primary Care Provider.  ?________________________________________________________ ? ?The  GI providers would like to encourage you to use [facility] to communicate with providers for non-urgent requests or questions.  Due to long hold times on the telephone, sending your provider a message by [facility] may be a faster and more efficient way to get a response.  Please allow 48 business hours for a response.  Please remember that this is for non-urgent requests.  ?_______________________________________________________ ? ?We have sent the following medications to your pharmacy for you to pick up at your convenience: ? ?START: Bentyl '10mg'$  one tablet one to two times daily as needed for abdominal cramping. ? ?Please start taking citrucel (orange flavored) powder fiber supplement.  This may cause some bloating at first but that usually goes away. Begin with a small spoonful and work your way up to a large, heaping spoonful daily over a week. ? ?You will need a follow up appointment in 3 months  (August 2023).  We will contact you to schedule this appointment. ? ?Thank you for entrusting me with your care and choosing [facility]. ? ?Dr Ardis Hughs ? ?

## 2021-11-01 NOTE — Progress Notes (Signed)
Review of pertinent gastrointestinal problems: ?1.  Chronic GERD which led to eventual EGD 08/2021 Dr. Ardis Hughs found mild nonspecific gastritis which was negative for H. pylori. ?2.  Elevated risk for colon cancer given family history of colon cancer, his sister had colon cancer in her 25s.  Colonoscopy 08/2021 found a single subcentimeter tubular adenoma, also a 20 cm segment in the left colon that was mildly erythematous.  Biopsy showed acute inflammation and the pathologist felt the changes might be related to a "self-limited colitis" or medication effect or perhaps related to diverticulosis.  This is likely due to NSAIDs, he was taking naproxen regularly.  Recall colonoscopy at 5-year interval recommended. ? ? ?HPI: ?This is a very pleasant 46 year old man whom I last saw at time of his colonoscopy about 2 months ago.  See those results summarized above ? ? ?Blood work 09/2593 complete metabolic profile was normal ? ?He wanted today to discuss his alternating bowel patterns.  He will have no BM for 3 to 4 days and then have a tough to move constipated stool followed by a lot of liquid stools.  This has been a pattern of his for many many years.  Previously he was having chronic loose stools daily.  He takes probiotic align and has noticed no difference.  He is on chronic pain medicines.  He is also on NSAIDs daily for.  These are all for back pain. ? ?He has a lot of cramping in his abdomen.  This is usually improved when he moves his bowels.  When he was younger Bentyl seem to help. ? ?ROS: complete GI ROS as described in HPI, all other review negative. ? ?Constitutional:  No unintentional weight loss ? ? ?Past Medical History:  ?Diagnosis Date  ? Arthritis   ? Chronic back pain   ? Family history of colon cancer   ? sister  ? GERD (gastroesophageal reflux disease)   ? History of kidney stones   ? Hypertension   ? Kidney stones   ? ? ?Past Surgical History:  ?Procedure Laterality Date  ? BACK SURGERY  04/11/2020  ?  CHOLECYSTECTOMY N/A 10/01/2020  ? Procedure: LAPAROSCOPIC CHOLECYSTECTOMY;  Surgeon: Jovita Kussmaul, MD;  Location: [facility];  Service: General;  Laterality: N/A;  ? COLONOSCOPY  1991  ? IBS  ? endocolon  09/15/2021  ? FHCC, GERD  ? HAND SURGERY Bilateral   ? SHOULDER SURGERY Right 08/2021  ? SPINAL CORD STIMULATOR IMPLANT  2022  ? SPINAL CORD STIMULATOR REMOVAL  2022  ? TONSILLECTOMY    ? VASECTOMY    ? ? ?Current Outpatient Medications  ?Medication Instructions  ? albuterol (PROAIR HFA) 108 (90 Base) MCG/ACT inhaler 2 puffs, Inhalation, Every 6 hours PRN  ? esomeprazole (NEXIUM) 40 mg, Oral, 2 times daily before meals  ? esomeprazole (NEXIUM) 40 mg, Oral, Daily  ? fluticasone (FLONASE) 50 MCG/ACT nasal spray 2 sprays, Each Nare, Daily  ? loratadine (CLARITIN) 10 mg, Oral, Daily PRN  ? naproxen (NAPROSYN) 500 mg, Oral, 2 times daily PRN  ? ondansetron (ZOFRAN) 4 mg, Oral, Every 8 hours PRN  ? Oxycodone HCl 10 mg, Oral, 3 times daily, Take 1 tablet by mouth every 3-4 hours as needed postoperatively.  ? oxymetazoline (AFRIN) 0.05 % nasal spray 1 spray, Each Nare, Daily  ? pregabalin (LYRICA) 150 mg, Oral, 2 times daily  ? Probiotic Product (ALIGN PO) 1 capsule, Oral, Daily  ? tiZANidine (ZANAFLEX) 4 mg, Oral, 3 times daily  ? ? ?  Allergies as of 11/01/2021 - Review Complete 11/01/2021  ?Allergen Reaction Noted  ? Gabapentin  05/31/2020  ? ? ?Family History  ?Problem Relation Age of Onset  ? Hypertension Father   ? Heart attack Father   ? Other Father   ?     Small intestine removed but does not know why  ? Diabetes Sister   ? Heart disease Sister   ? Colon cancer Sister   ? Colon polyps Sister   ? Colon polyps Sister   ? Healthy Sister   ? Healthy Sister   ? Healthy Sister   ? Other Brother   ?     MVA  ? Healthy Brother   ? Healthy Brother   ? Healthy Brother   ? Healthy Brother   ? Healthy Brother   ? Healthy Brother   ? Liver disease Neg Hx   ? Pancreatic cancer Neg Hx   ? Esophageal cancer Neg Hx   ? Stomach cancer  Neg Hx   ? ? ?Social History  ? ?Socioeconomic History  ? Marital status: Married  ?  Spouse name: Not on file  ? Number of children: Not on file  ? Years of education: Not on file  ? Highest education level: Not on file  ?Occupational History  ? Not on file  ?Tobacco Use  ? Smoking status: Never  ? Smokeless tobacco: Never  ?Vaping Use  ? Vaping Use: Never used  ?Substance and Sexual Activity  ? Alcohol use: Not Currently  ?  Comment: states uit around age 76  ? Drug use: Never  ? Sexual activity: Not Currently  ?  Birth control/protection: Surgical  ?  Comment: vastectomy  ?Other Topics Concern  ? Not on file  ?Social History Narrative  ? Not on file  ? ?Social Determinants of Health  ? ?Financial Resource Strain: Not on file  ?Food Insecurity: Not on file  ?Transportation Needs: Not on file  ?Physical Activity: Not on file  ?Stress: Not on file  ?Social Connections: Not on file  ?Intimate Partner Violence: Not on file  ? ? ? ?Physical Exam: ?Ht '5\' 8"'$  (1.727 m)   Wt 201 lb 6.4 oz (91.4 kg)   BMI 30.62 kg/m?  ?Constitutional: generally well-appearing ?Psychiatric: alert and oriented x3 ?Abdomen: soft, nontender, nondistended, no obvious ascites, no peritoneal signs, normal bowel sounds ?No peripheral edema noted in lower extremities ? 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 ?First the segment of acute inflammation in his colon is most likely due to naproxen.  I do not think further testing needs to be done for that. ? ?Second he has IBS mixed constipation diarrhea.  I recommended that he start taking Citrucel on a daily routine regimen.  I am also prescribing him Bentyl 10 mg pills 1 pill taken on an as-needed basis for cramping, this has helped him in the distant past.  He will follow-up with me in 8 to 10 weeks and sooner if needed. ? ?Please see the "Patient Instructions" section for addition details about the plan. ? ?Owens Loffler, MD ?[facility] ?11/01/2021, 9:44 AM ? ? ?Total time  on date of encounter was 25 minutes (this included time spent preparing to see the patient reviewing records; obtaining and/or reviewing separately obtained history; performing a medically appropriate exam and/or evaluation; counseling and educating the patient and family if present; ordering medications, tests or procedures if applicable; and documenting clinical information in the health record). ? ?

## 2021-11-01 NOTE — Therapy (Signed)
?OUTPATIENT PHYSICAL THERAPY TREATMENT NOTE ? ? ?Patient Name: Adam Vaughn ?MRN: [id number] ?DOB:Jul 11, 1975, 46 y.o., male ?Today's Date: 11/01/2021 ? ?PCP: Plotnikov, Evie Lacks, MD ?REFERRING PROVIDER: Meredith Pel, MD ? ?END OF SESSION:  ? PT End of Session - 11/01/21 0845   ? ? Visit Number 3   ? Number of Visits 16   ? Date for PT Re-Evaluation 12/07/21   ? Authorization Type [employer]   ? PT Start Time 0830   ? PT Stop Time 0910   ? PT Time Calculation (min) 40 min   ? Activity Tolerance Patient tolerated treatment well   ? Behavior During Therapy [facility] for tasks assessed/performed   ? ?  ?  ? ?  ? ? ? ?Past Medical History:  ?Diagnosis Date  ? Arthritis   ? Family history of colon cancer   ? sister  ? GERD (gastroesophageal reflux disease)   ? History of kidney stones   ? Hypertension   ? Kidney stones   ? ?Past Surgical History:  ?Procedure Laterality Date  ? BACK SURGERY  04/11/2020  ? CHOLECYSTECTOMY N/A 10/01/2020  ? Procedure: LAPAROSCOPIC CHOLECYSTECTOMY;  Surgeon: Jovita Kussmaul, MD;  Location: [facility];  Service: General;  Laterality: N/A;  ? COLONOSCOPY  1991  ? IBS  ? endocolon  09/15/2021  ? FHCC, GERD  ? HAND SURGERY Bilateral   ? TONSILLECTOMY    ? VASECTOMY    ? ?Patient Active Problem List  ? Diagnosis Date Noted  ? Bladder neck obstruction 09/04/2021  ? Asthmatic bronchitis 09/04/2021  ? Pain in right shoulder 06/29/2021  ? Meteorism 06/01/2021  ? Sinus congestion 02/28/2021  ? Vertigo 02/17/2021  ? Headache 02/17/2021  ? Fever 02/17/2021  ? Falls 02/17/2021  ? Allergic rhinitis 10/25/2020  ? Superficial phlebitis 10/25/2020  ? Family history of acute polio 10/25/2020  ? Chest pain, atypical 10/10/2020  ? S/P laparoscopic cholecystectomy 10/10/2020  ? Hyperglycemia 10/10/2020  ? Acute cholecystitis 09/30/2020  ? Insomnia 09/26/2020  ? Knee pain 06/23/2020  ? Low vitamin B12 level 05/31/2020  ? Ankle sprain 05/31/2020  ? Constipation by delayed colonic transit 05/31/2020  ? Degenerative  spondylolisthesis 04/11/2020  ? 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 DDD (degenerative disc disease), lumbar 01/20/2020  ? Left lumbar radiculitis 01/20/2020  ? Vitamin D deficiency 01/15/2020  ? Low back pain 01/12/2020  ? IBS (irritable bowel syndrome) 01/12/2020  ? Nephrolithiasis 01/12/2020  ? Erectile dysfunction 01/12/2020  ? GERD (gastroesophageal reflux disease) 01/12/2020  ? ? ?REFERRING DIAG: M25.511 (ICD-10-CM) - Right shoulder pain, unspecified chronicity  ? ?THERAPY DIAG:  ?Acute pain of right shoulder ? ?Stiffness of right shoulder, not elsewhere classified ? ?Muscle weakness (generalized) ? ?Abnormal posture ? ?Localized edema ? ?PERTINENT HISTORY: OA, HTN, back surgery 2021 ? ?PRECAUTIONS: Fall; Shoulder Now okay for progressing ROM and light strengthening activities ? ?SUBJECTIVE: overall shoulder is doing well; now able to do more overhead and light strengthening exercises.  ? ?PAIN:  ?Are you having pain? Yes: NPRS scale: 3, up to 3/10 ?Pain location: Rt shoulder ?Pain description: sore aching ?Aggravating factors: when he moves it different ways ?Relieving factors: rest ? ? ?OBJECTIVE: (objective measures completed at initial evaluation unless otherwise dated) ? ? ?OBJECTIVE:  ?  ?PATIENT SURVEYS:  ?10/12/21: FOTO 48 (predicted 68) ?                                 ?  POSTURE: ?Rounded shoulders ?  ?UPPER EXTREMITY ROM:  ?  ?Passive ROM Right ?10/12/2021 Left ?10/12/2021  ?Shoulder flexion 90    ?Shoulder extension      ?Shoulder abduction 90    ?Shoulder adduction      ?Shoulder internal rotation 60 (in 55 deg abdct)    ?Shoulder external rotation 40 ?(In 55 deg abdct)    ?Elbow flexion      ?Elbow extension      ?Wrist flexion      ?Wrist extension      ?Wrist ulnar deviation      ?Wrist radial deviation      ?Wrist pronation      ?Wrist supination      ?(Blank rows = not tested) ?  ?Active ROM Right ?10/12/2021 Left ?10/12/2021 Right ?11/01/21  ?Shoulder flexion  90    135  ?Shoulder extension       ?Shoulder abduction 90    126  ?Shoulder adduction       ?Shoulder internal rotation     80  ?Shoulder external rotation     59  ?Elbow flexion       ?Elbow extension       ?Wrist flexion       ?Wrist extension       ?Wrist ulnar deviation       ?Wrist radial deviation       ?Wrist pronation       ?Wrist supination       ?(Blank rows = not tested) ?  ?UPPER EXTREMITY MMT: deferred due to post op status ?  ?MMT Right ?10/12/2021 Left ?10/12/2021  ?Shoulder flexion      ?Shoulder extension      ?Shoulder abduction      ?Shoulder adduction      ?Shoulder internal rotation      ?Shoulder external rotation      ?Middle trapezius      ?Lower trapezius      ?Elbow flexion      ?Elbow extension      ?Wrist flexion      ?Wrist extension      ?Wrist ulnar deviation      ?Wrist radial deviation      ?Wrist pronation      ?Wrist supination      ?Grip strength (lbs)      ?(Blank rows = not tested) ?  ?            ?TODAY'S TREATMENT: ?11/01/21 ?Therex: ?     Supine: ?Rt shoulder flexion x 10 reps; 2# with 5 sec hold at end range ?    Sitting: ?Pulleys x 2 min; flexion and scaption each ?    Aerobic: ?UBE L2.5 x 6 min (3 min each direction) ?     Standing: ?Rows L4 x 10; 5 sec hold ?IR with L3 band x 10 reps ?ER with L3 band x 10 reps ?Manual: ?Rt shoulder PROM all motions to tolerance ? ?10/26/21 ?Therex: ?     Sitting: ?Sustained flexion hold at 90 deg 3x30 sec, Rt ?Sustained abduction hold at 90 deg on Rt;  3x30 sec ?Seated AA flexion Rt shoulder ?Discussed HEP, will remove pendulums as pt is unable due to back. ?  ?Self-Care: ?Reinforced protocol restrictions including nothing above shoulder height and no cross body adduction.  Pt verbalized understanding. ?Manual: ?Rt shoulder PROM all motions within protocol restrictions. ? ? ? ? ?10/12/21 ?  Therex: See pt instructions, did change standing pendulums to sitting due to back pain and buckling observed in clinic needing to reach for  support with LUE.  Performed deltoid isometrics with LUE support or wall support. ?  ?PATIENT EDUCATION: ?Education details: HEP ?Person educated: Patient ?Education method: Explanation, Demonstration, and Handouts ?Education comprehension: verbalized understanding, returned demonstration, and needs further education ?  ?  ?HOME EXERCISE PROGRAM: ?Access Code: [email or web address] ?URL: [email or web address] ?Date: 11/01/2021 ?Prepared by: Faustino Congress ? ?Exercises ?- Standing Circular Shoulder Pendulum Supported with Arm Bent  - 3-5 x daily - 7 x weekly - 1-2 sets - 10 reps ?- Standing Horizontal Shoulder Pendulum Supported with Arm Bent  - 3-5 x daily - 7 x weekly - 1-2 sets - 10 reps ?- Isometric Shoulder Abduction at Wall  - 3-5 x daily - 7 x weekly - 1-2 sets - 10 reps - 5 sec hold ?- Standing Isometric Shoulder Extension with Doorway - Arm Bent  - 3-5 x daily - 7 x weekly - 1-2 sets - 10 reps - 5 sec hold ?- Isometric Shoulder Flexion at Wall  - 1 x daily - 7 x weekly - 3 sets - 10 reps - 5 sec hold ?- Supine Shoulder Flexion Extension Full Range AROM  - 1-2 x daily - 7 x weekly - 1-2 sets - 10 reps - 5 sec hold ?- Standing Row with Anchored Resistance  - 1-2 x daily - 7 x weekly - 1-2 sets - 10 reps - 5 se hold ?- Shoulder Internal Rotation with Resistance  - 1-2 x daily - 7 x weekly - 1-2 sets - 10 reps ?- Shoulder External Rotation with Anchored Resistance  - 1-2 x daily - 7 x weekly - 1-2 sets - 10 reps ?  ?ASSESSMENT: ?  ?CLINICAL IMPRESSION: ?Pt tolerated session well with progression of AROM and light strengthening activities.  Pt will continue to benefit from PT to maximize function. ?  ?OBJECTIVE IMPAIRMENTS decreased ROM, decreased strength, hypomobility, increased fascial restrictions, increased muscle spasms, impaired UE functional use, and pain.  ?  ?ACTIVITY LIMITATIONS cleaning, community activity, driving, meal prep, and laundry.  ?  ?PERSONAL FACTORS 3+ comorbidities: OA, HTN, hx back  surgery Nov 2021  are also affecting patient's functional outcome.  ?  ?  ?REHAB POTENTIAL: Good ?  ?CLINICAL DECISION MAKING: Evolving/moderate complexity ?  ?EVALUATION COMPLEXITY: Moderate ?  ?  ?GOALS: ?G

## 2021-11-02 ENCOUNTER — Encounter: Payer: Self-pay | Admitting: Gastroenterology

## 2021-11-02 DIAGNOSIS — K219 Gastro-esophageal reflux disease without esophagitis: Secondary | ICD-10-CM

## 2021-11-02 DIAGNOSIS — K589 Irritable bowel syndrome without diarrhea: Secondary | ICD-10-CM

## 2021-11-02 DIAGNOSIS — K2970 Gastritis, unspecified, without bleeding: Secondary | ICD-10-CM

## 2021-11-07 ENCOUNTER — Other Ambulatory Visit: Payer: Self-pay

## 2021-11-07 ENCOUNTER — Encounter: Payer: Self-pay | Admitting: Internal Medicine

## 2021-11-07 ENCOUNTER — Ambulatory Visit (INDEPENDENT_AMBULATORY_CARE_PROVIDER_SITE_OTHER): Payer: Commercial Managed Care - HMO | Admitting: Internal Medicine

## 2021-11-07 DIAGNOSIS — G894 Chronic pain syndrome: Secondary | ICD-10-CM

## 2021-11-07 DIAGNOSIS — G4700 Insomnia, unspecified: Secondary | ICD-10-CM | POA: Diagnosis not present

## 2021-11-07 DIAGNOSIS — G5700 Lesion of sciatic nerve, unspecified lower limb: Secondary | ICD-10-CM | POA: Insufficient documentation

## 2021-11-07 DIAGNOSIS — E538 Deficiency of other specified B group vitamins: Secondary | ICD-10-CM | POA: Diagnosis not present

## 2021-11-07 DIAGNOSIS — M545 Low back pain, unspecified: Secondary | ICD-10-CM

## 2021-11-07 DIAGNOSIS — G8929 Other chronic pain: Secondary | ICD-10-CM

## 2021-11-07 MED ORDER — ONDANSETRON HCL 4 MG PO TABS
4.0000 mg | ORAL_TABLET | Freq: Three times a day (TID) | ORAL | 0 refills | Status: DC | PRN
Start: 2021-11-07 — End: 2021-12-22

## 2021-11-07 MED ORDER — DULOXETINE HCL 30 MG PO CPEP: 30.0000 mg | ORAL_CAPSULE | Freq: Every day | ORAL | 3 refills | Status: DC

## 2021-11-07 MED ORDER — OXYCODONE HCL 10 MG PO TABS: 10.0000 mg | ORAL_TABLET | Freq: Three times a day (TID) | ORAL | 0 refills | Status: DC | PRN

## 2021-11-07 MED ORDER — OXYCODONE HCL 10 MG PO TABS: 10.0000 mg | ORAL_TABLET | Freq: Three times a day (TID) | ORAL | 0 refills | Status: DC

## 2021-11-07 NOTE — Patient Instructions (Addendum)
Blue-Emu cream -- was recommended to use 2-3 times a day ? ??TENS unit with EMS function ? ?HOKA bondi 3 ?

## 2021-11-07 NOTE — Progress Notes (Signed)
? ?Subjective:  ?Patient ID: Adam Vaughn, male    DOB: 10/24/1975  Age: 46 y.o. MRN: [id number] ? ?CC: No chief complaint on file. ? ? ?HPI ?Adam Vaughn presents for LBP - chronic pain, B12 def ?C/o depression insomnia ?No recent falls ? ?Outpatient Medications Prior to Visit  ?Medication Sig Dispense Refill  ? albuterol (PROAIR HFA) 108 (90 Base) MCG/ACT inhaler Inhale 2 puffs into the lungs every 6 (six) hours as needed for wheezing or shortness of breath. 3 each 1  ? dicyclomine (BENTYL) 10 MG capsule Take one tablet one to two times daily as needed for abdominal cramping 50 capsule 3  ? esomeprazole (NEXIUM) 40 MG capsule Take 1 capsule (40 mg total) by mouth 2 (two) times daily before a meal. 180 capsule 3  ? esomeprazole (NEXIUM) 40 MG capsule Take 1 capsule (40 mg total) by mouth daily. 90 capsule 3  ? fluticasone (FLONASE) 50 MCG/ACT nasal spray Place 2 sprays into both nostrils daily. 16 g 6  ? loratadine (CLARITIN) 10 MG tablet Take 10 mg by mouth daily as needed for allergies.    ? methylcellulose (CITRUCEL) oral powder Take 1 packet by mouth daily.    ? naproxen (NAPROSYN) 500 MG tablet Take 1 tablet (500 mg total) by mouth 2 (two) times daily as needed for moderate pain. 180 tablet 0  ? oxymetazoline (AFRIN) 0.05 % nasal spray Place 1 spray into both nostrils daily.    ? pregabalin (LYRICA) 150 MG capsule Take 1 capsule (150 mg total) by mouth 2 (two) times daily. 60 capsule 5  ? tiZANidine (ZANAFLEX) 4 MG capsule Take 1 capsule (4 mg total) by mouth 3 (three) times daily. 180 capsule 0  ? ondansetron (ZOFRAN) 4 MG tablet Take 1 tablet (4 mg total) by mouth every 8 (eight) hours as needed for nausea or vomiting. 20 tablet 0  ? Oxycodone HCl 10 MG TABS Take 1 tablet (10 mg total) by mouth 3 (three) times daily. Take 1 tablet by mouth every 3-4 hours as needed postoperatively. 90 tablet 0  ? Probiotic Product (ALIGN PO) Take 1 capsule by mouth daily.    ? ?No facility-administered medications  prior to visit.  ? ? ?ROS: ?Review of Systems  ?Constitutional:  Negative for appetite change, fatigue and unexpected weight change.  ?HENT:  Negative for congestion, nosebleeds, sneezing, sore throat and trouble swallowing.   ?Eyes:  Negative for itching and visual disturbance.  ?Respiratory:  Negative for cough.   ?Cardiovascular:  Negative for chest pain, palpitations and leg swelling.  ?Gastrointestinal:  Negative for abdominal distention, blood in stool, diarrhea and nausea.  ?Genitourinary:  Negative for frequency and hematuria.  ?Musculoskeletal:  Positive for back pain and gait problem. Negative for joint swelling and neck pain.  ?Skin:  Negative for rash.  ?Neurological:  Negative for dizziness, tremors, speech difficulty and weakness.  ?Psychiatric/Behavioral:  Positive for decreased concentration, dysphoric mood and sleep disturbance. Negative for agitation, behavioral problems, self-injury and suicidal ideas. The patient is nervous/anxious.   ? ?Objective:  ?BP 122/90 (BP Location: Left Arm, Patient Position: Sitting, Cuff Size: Normal)   Pulse 87   Temp 98.7 ?F (37.1 ?C) (Oral)   Ht '5\' 8"'$  (1.727 m)   Wt 198 lb (89.8 kg)   SpO2 97%   BMI 30.11 kg/m?  ? ?BP Readings from Last 3 Encounters:  ?11/07/21 122/90  ?11/01/21 118/70  ?10/25/21 (!) 143/99  ? ? ?Wt Readings from Last 3 Encounters:  ?11/07/21  198 lb (89.8 kg)  ?11/01/21 201 lb 6.4 oz (91.4 kg)  ?10/19/21 203 lb 12.8 oz (92.4 kg)  ? ? ?Physical Exam ?Constitutional:   ?   General: He is not in acute distress. ?   Appearance: Normal appearance. He is well-developed.  ?   Comments: NAD  ?Eyes:  ?   Conjunctiva/sclera: Conjunctivae normal.  ?   Pupils: Pupils are equal, round, and reactive to light.  ?Neck:  ?   Thyroid: No thyromegaly.  ?   Vascular: No JVD.  ?Cardiovascular:  ?   Rate and Rhythm: Normal rate and regular rhythm.  ?   Heart sounds: Normal heart sounds. No murmur heard. ?  No friction rub. No gallop.  ?Pulmonary:  ?   Effort:  Pulmonary effort is normal. No respiratory distress.  ?   Breath sounds: Normal breath sounds. No wheezing or rales.  ?Chest:  ?   Chest wall: No tenderness.  ?Abdominal:  ?   General: Bowel sounds are normal. There is no distension.  ?   Palpations: Abdomen is soft. There is no mass.  ?   Tenderness: There is no abdominal tenderness. There is no guarding or rebound.  ?Musculoskeletal:     ?   General: Tenderness present. Normal range of motion.  ?   Cervical back: Normal range of motion.  ?Lymphadenopathy:  ?   Cervical: No cervical adenopathy.  ?Skin: ?   General: Skin is warm and dry.  ?   Findings: No rash.  ?Neurological:  ?   Mental Status: He is alert and oriented to person, place, and time.  ?   Cranial Nerves: No cranial nerve deficit.  ?   Motor: No abnormal muscle tone.  ?   Coordination: Coordination abnormal.  ?   Deep Tendon Reflexes: Reflexes are normal and symmetric.  ?Psychiatric:     ?   Behavior: Behavior normal.     ?   Thought Content: Thought content normal.     ?   Judgment: Judgment normal.  ?Ataxic gait ?LS w/pain ? ?Lab Results  ?Component Value Date  ? WBC 3.8 (L) 02/17/2021  ? HGB 14.4 02/17/2021  ? HCT 42.2 02/17/2021  ? PLT 195.0 02/17/2021  ? GLUCOSE 95 09/04/2021  ? ALT 30 09/04/2021  ? AST 19 09/04/2021  ? NA 138 09/04/2021  ? K 4.1 09/04/2021  ? CL 103 09/04/2021  ? CREATININE 0.86 09/04/2021  ? BUN 18 09/04/2021  ? CO2 26 09/04/2021  ? TSH 1.26 01/14/2020  ? PSA 1.83 09/04/2021  ? HGBA1C 5.5 09/04/2021  ? ? ?DG INJECT DIAG/THERA/INC NEEDLE/CATH/PLC EPI/LUMB/SAC W/IMG ? ?Result Date: 10/25/2021 ?CLINICAL DATA:  Low back and bilateral lower extremity pain to the feet. L4-S1 fusion with posterior decompression. Patent canal and foramina at these levels. Adjacent level degenerative change at L3-L4 with similar mild canal stenosis and mild to moderate bilateral foraminal stenosis. EXAM: CAUDAL EPIDURAL INJECTION UNDER FLUOROSCOPIC GUIDANCE FLUOROSCOPY: Radiation Exposure Index (as  provided by the fluoroscopic device): 3.9 mGy air Kerma PROCEDURE: After a thorough discussion of risks and benefits of the procedure including bleeding, infection, injury to nerves, blood vessels, and adjacent structures, written and oral informed consent was obtained. Consent was obtained. Specific risks to this procedure included injury to the terminal portion of the thecal sac and/or nerve roots. Utilizing a caudal approach, the skin overlying the sacral hiatus was cleansed and anesthetized. A 20 gauge Crawford epidural needle was advanced into the sacral epidural space. Injection of  Omnipaque 180 shows a good epidural pattern with spread up to L5-S1. No vascular or subarachnoid opacification is seen. '80mg'$  of Depo-Medrol mixed with 3 cc of normal saline and 5cc of 1% Lidocaine were instilled. The procedure was well-tolerated, and the patient was discharged thirty minutes following the injection in good condition. IMPRESSION: Technically successful  caudal epidural injection. Electronically Signed   By: Lucrezia Europe M.D.   On: 10/25/2021 15:42  ? ? ?Assessment & Plan:  ? ?Problem List Items Addressed This Visit   ? ? Low back pain  ?  Chronic severe LBP - it is too expensive for the patient to continue to go to pain clinic.  I will have to take over his pain prescriptions.  He had a UDS screen a couple months ago.  Was signed a pain contract.  Currently on oxycodone 10 mg 3 times daily. ?Blue-Emu cream -- was recommended to use 2-3 times a day ??TENS unit with EMS function ?HOKA bondi 3 ?Pt had a low back injection L4-5, S1 ?  ?  ? Relevant Medications  ? Oxycodone HCl 10 MG TABS  ? Oxycodone HCl 10 MG TABS  ? Low vitamin B12 level  ?  Re-start B12 ?Risks associated with treatment noncompliance were discussed. Compliance was encouraged. ? ? ? ?  ?  ? Insomnia  ?  Try Cymbalta at hs again ?  ?  ? Chronic pain syndrome  ?  Chronic severe LBP - it is too expensive for the patient to continue to go to pain clinic.  I  will have to take over his pain prescriptions.  He had a UDS screen a couple months ago.  Was signed a pain contract.  Currently on oxycodone 10 mg 3 times daily. ?Blue-Emu cream -- was recommended to use 2-3 times a

## 2021-11-07 NOTE — Telephone Encounter (Signed)
Lab orders in epic. Pt notified of recommendations via [place]. ?

## 2021-11-07 NOTE — Progress Notes (Deleted)
? ?New Patient Office Visit ? ?Subjective   ? ?Patient ID: Adam Vaughn, male    DOB: 1975-09-02  Age: 46 y.o. MRN: [id number] ? ?CC: No chief complaint on file. ? ? ?HPI ?Adam Vaughn presents to establish care ?*** ? ?Outpatient Encounter Medications as of 11/07/2021  ?Medication Sig  ? albuterol (PROAIR HFA) 108 (90 Base) MCG/ACT inhaler Inhale 2 puffs into the lungs every 6 (six) hours as needed for wheezing or shortness of breath.  ? dicyclomine (BENTYL) 10 MG capsule Take one tablet one to two times daily as needed for abdominal cramping  ? DULoxetine (CYMBALTA) 30 MG capsule Take 1 capsule (30 mg total) by mouth at bedtime.  ? esomeprazole (NEXIUM) 40 MG capsule Take 1 capsule (40 mg total) by mouth 2 (two) times daily before a meal.  ? esomeprazole (NEXIUM) 40 MG capsule Take 1 capsule (40 mg total) by mouth daily.  ? fluticasone (FLONASE) 50 MCG/ACT nasal spray Place 2 sprays into both nostrils daily.  ? loratadine (CLARITIN) 10 MG tablet Take 10 mg by mouth daily as needed for allergies.  ? methylcellulose (CITRUCEL) oral powder Take 1 packet by mouth daily.  ? naproxen (NAPROSYN) 500 MG tablet Take 1 tablet (500 mg total) by mouth 2 (two) times daily as needed for moderate pain.  ? Oxycodone HCl 10 MG TABS Take 1 tablet (10 mg total) by mouth 3 (three) times daily as needed.  ? oxymetazoline (AFRIN) 0.05 % nasal spray Place 1 spray into both nostrils daily.  ? pregabalin (LYRICA) 150 MG capsule Take 1 capsule (150 mg total) by mouth 2 (two) times daily.  ? tiZANidine (ZANAFLEX) 4 MG capsule Take 1 capsule (4 mg total) by mouth 3 (three) times daily.  ? [DISCONTINUED] ondansetron (ZOFRAN) 4 MG tablet Take 1 tablet (4 mg total) by mouth every 8 (eight) hours as needed for nausea or vomiting.  ? [DISCONTINUED] Oxycodone HCl 10 MG TABS Take 1 tablet (10 mg total) by mouth 3 (three) times daily. Take 1 tablet by mouth every 3-4 hours as needed postoperatively.  ? [DISCONTINUED] Probiotic Product  (ALIGN PO) Take 1 capsule by mouth daily.  ? ondansetron (ZOFRAN) 4 MG tablet Take 1 tablet (4 mg total) by mouth every 8 (eight) hours as needed for nausea or vomiting.  ? Oxycodone HCl 10 MG TABS Take 1 tablet (10 mg total) by mouth 3 (three) times daily. Please fill on or after 11/10/21  ? ?No facility-administered encounter medications on file as of 11/07/2021.  ? ? ?Past Medical History:  ?Diagnosis Date  ? Arthritis   ? Chronic back pain   ? Family history of colon cancer   ? sister  ? GERD (gastroesophageal reflux disease)   ? History of kidney stones   ? Hypertension   ? Kidney stones   ? ? ?Past Surgical History:  ?Procedure Laterality Date  ? BACK SURGERY  04/11/2020  ? CHOLECYSTECTOMY N/A 10/01/2020  ? Procedure: LAPAROSCOPIC CHOLECYSTECTOMY;  Surgeon: Jovita Kussmaul, MD;  Location: [facility];  Service: General;  Laterality: N/A;  ? COLONOSCOPY  1991  ? IBS  ? endocolon  09/15/2021  ? [facility], GERD  ? HAND SURGERY Bilateral   ? SHOULDER SURGERY Right 08/2021  ? SPINAL CORD STIMULATOR IMPLANT  2022  ? SPINAL CORD STIMULATOR REMOVAL  2022  ? TONSILLECTOMY    ? VASECTOMY    ? ? ?Family History  ?Problem Relation Age of Onset  ? Hypertension Father   ?  Heart attack Father   ? Other Father   ?     Small intestine removed but does not know why  ? Diabetes Sister   ? Heart disease Sister   ? Colon cancer Sister   ? Colon polyps Sister   ? Colon polyps Sister   ? Healthy Sister   ? Healthy Sister   ? Healthy Sister   ? Other Brother   ?     MVA  ? Healthy Brother   ? Healthy Brother   ? Healthy Brother   ? Healthy Brother   ? Healthy Brother   ? Healthy Brother   ? Liver disease Neg Hx   ? Pancreatic cancer Neg Hx   ? Esophageal cancer Neg Hx   ? Stomach cancer Neg Hx   ? ? ?Social History  ? ?Socioeconomic History  ? Marital status: Married  ?  Spouse name: Not on file  ? Number of children: Not on file  ? Years of education: Not on file  ? Highest education level: Not on file  ?Occupational History  ? Not on file   ?Tobacco Use  ? Smoking status: Never  ? Smokeless tobacco: Never  ?Vaping Use  ? Vaping Use: Never used  ?Substance and Sexual Activity  ? Alcohol use: Not Currently  ?  Comment: states uit around age 53  ? Drug use: Never  ? Sexual activity: Not Currently  ?  Birth control/protection: Surgical  ?  Comment: vastectomy  ?Other Topics Concern  ? Not on file  ?Social History Narrative  ? Not on file  ? ?Social Determinants of Health  ? ?Financial Resource Strain: Not on file  ?Food Insecurity: Not on file  ?Transportation Needs: Not on file  ?Physical Activity: Not on file  ?Stress: Not on file  ?Social Connections: Not on file  ?Intimate Partner Violence: Not on file  ? ? ?ROS ? ?  ? ? ?Objective   ? ?BP 122/90 (BP Location: Left Arm, Patient Position: Sitting, Cuff Size: Normal)   Pulse 87   Temp 98.7 ?F (37.1 ?C) (Oral)   Ht '5\' 8"'$  (1.727 m)   Wt 198 lb (89.8 kg)   SpO2 97%   BMI 30.11 kg/m?  ? ?Physical Exam ? ?{Labs (Optional):23779} ?  ? ?Assessment & Plan:  ? ? ? ? ?Return in about 2 months (around 01/07/2022) for a follow-up visit.  ? ?Tomma Lightning, RMA ? ? ?

## 2021-11-07 NOTE — Assessment & Plan Note (Signed)
Chronic severe LBP - it is too expensive for the patient to continue to go to pain clinic.  I will have to take over his pain prescriptions.  He had a UDS screen a couple months ago.  Was signed a pain contract.  Currently on oxycodone 10 mg 3 times daily. ?Blue-Emu cream -- was recommended to use 2-3 times a day ??TENS unit with EMS function ?HOKA bondi 3 ?Pt had a low back injection L4-5, S1 ?

## 2021-11-07 NOTE — Progress Notes (Deleted)
? ?Subjective:  ?Patient ID: Adam Vaughn, male    DOB: 11/29/1975  Age: 46 y.o. MRN: [id number] ? ?CC: No chief complaint on file. ? ? ?HPI ?MARSH HECKLER presents for LBP, leg weakness ?Pt had a low back injection L4-5, S1 ?F/u B12 def ?C/o anxiety, overwhelmed at home ? ?Outpatient Medications Prior to Visit  ?Medication Sig Dispense Refill  ? albuterol (PROAIR HFA) 108 (90 Base) MCG/ACT inhaler Inhale 2 puffs into the lungs every 6 (six) hours as needed for wheezing or shortness of breath. 3 each 1  ? dicyclomine (BENTYL) 10 MG capsule Take one tablet one to two times daily as needed for abdominal cramping 50 capsule 3  ? esomeprazole (NEXIUM) 40 MG capsule Take 1 capsule (40 mg total) by mouth 2 (two) times daily before a meal. 180 capsule 3  ? esomeprazole (NEXIUM) 40 MG capsule Take 1 capsule (40 mg total) by mouth daily. 90 capsule 3  ? fluticasone (FLONASE) 50 MCG/ACT nasal spray Place 2 sprays into both nostrils daily. 16 g 6  ? loratadine (CLARITIN) 10 MG tablet Take 10 mg by mouth daily as needed for allergies.    ? methylcellulose (CITRUCEL) oral powder Take 1 packet by mouth daily.    ? naproxen (NAPROSYN) 500 MG tablet Take 1 tablet (500 mg total) by mouth 2 (two) times daily as needed for moderate pain. 180 tablet 0  ? oxymetazoline (AFRIN) 0.05 % nasal spray Place 1 spray into both nostrils daily.    ? pregabalin (LYRICA) 150 MG capsule Take 1 capsule (150 mg total) by mouth 2 (two) times daily. 60 capsule 5  ? tiZANidine (ZANAFLEX) 4 MG capsule Take 1 capsule (4 mg total) by mouth 3 (three) times daily. 180 capsule 0  ? ondansetron (ZOFRAN) 4 MG tablet Take 1 tablet (4 mg total) by mouth every 8 (eight) hours as needed for nausea or vomiting. 20 tablet 0  ? Oxycodone HCl 10 MG TABS Take 1 tablet (10 mg total) by mouth 3 (three) times daily. Take 1 tablet by mouth every 3-4 hours as needed postoperatively. 90 tablet 0  ? Probiotic Product (ALIGN PO) Take 1 capsule by mouth daily.    ? ?No  facility-administered medications prior to visit.  ? ? ?ROS: ?Review of Systems  ?Constitutional:  Positive for fatigue. Negative for appetite change and unexpected weight change.  ?HENT:  Negative for congestion, nosebleeds, sneezing, sore throat and trouble swallowing.   ?Eyes:  Negative for itching and visual disturbance.  ?Respiratory:  Negative for cough.   ?Cardiovascular:  Negative for chest pain, palpitations and leg swelling.  ?Gastrointestinal:  Negative for abdominal distention, blood in stool, diarrhea and nausea.  ?Genitourinary:  Negative for frequency and hematuria.  ?Musculoskeletal:  Positive for arthralgias, back pain and gait problem. Negative for joint swelling and neck pain.  ?Skin:  Negative for color change and rash.  ?Neurological:  Negative for dizziness, tremors, speech difficulty and weakness.  ?Psychiatric/Behavioral:  Positive for decreased concentration and sleep disturbance. Negative for agitation, dysphoric mood and suicidal ideas. The patient is nervous/anxious.   ? ?Objective:  ?BP 122/90 (BP Location: Left Arm, Patient Position: Sitting, Cuff Size: Normal)   Pulse 87   Temp 98.7 ?F (37.1 ?C) (Oral)   Ht '5\' 8"'$  (1.727 m)   Wt 198 lb (89.8 kg)   SpO2 97%   BMI 30.11 kg/m?  ? ?BP Readings from Last 3 Encounters:  ?11/07/21 122/90  ?11/01/21 118/70  ?10/25/21 (!) 143/99  ? ? ?  Wt Readings from Last 3 Encounters:  ?11/07/21 198 lb (89.8 kg)  ?11/01/21 201 lb 6.4 oz (91.4 kg)  ?10/19/21 203 lb 12.8 oz (92.4 kg)  ? ? ?Physical Exam ?Constitutional:   ?   General: He is not in acute distress. ?   Appearance: Normal appearance. He is well-developed.  ?   Comments: NAD  ?Eyes:  ?   Conjunctiva/sclera: Conjunctivae normal.  ?   Pupils: Pupils are equal, round, and reactive to light.  ?Neck:  ?   Thyroid: No thyromegaly.  ?   Vascular: No JVD.  ?Cardiovascular:  ?   Rate and Rhythm: Normal rate and regular rhythm.  ?   Heart sounds: Normal heart sounds. No murmur heard. ?  No friction rub.  No gallop.  ?Pulmonary:  ?   Effort: Pulmonary effort is normal. No respiratory distress.  ?   Breath sounds: Normal breath sounds. No wheezing or rales.  ?Chest:  ?   Chest wall: No tenderness.  ?Abdominal:  ?   General: Bowel sounds are normal. There is no distension.  ?   Palpations: Abdomen is soft. There is no mass.  ?   Tenderness: There is no abdominal tenderness. There is no guarding or rebound.  ?Musculoskeletal:     ?   General: Tenderness present. Normal range of motion.  ?   Cervical back: Normal range of motion.  ?Lymphadenopathy:  ?   Cervical: No cervical adenopathy.  ?Skin: ?   General: Skin is warm and dry.  ?   Findings: No rash.  ?Neurological:  ?   Mental Status: He is alert and oriented to person, place, and time.  ?   Cranial Nerves: No cranial nerve deficit.  ?   Motor: No abnormal muscle tone.  ?   Coordination: Coordination abnormal.  ?   Gait: Gait abnormal.  ?   Deep Tendon Reflexes: Reflexes are normal and symmetric.  ?Psychiatric:     ?   Behavior: Behavior normal.     ?   Thought Content: Thought content normal.     ?   Judgment: Judgment normal.  ?LS spine w/pain ?Antalgic gait ?R shoulder pain ? ? ?Lab Results  ?Component Value Date  ? WBC 3.8 (L) 02/17/2021  ? HGB 14.4 02/17/2021  ? HCT 42.2 02/17/2021  ? PLT 195.0 02/17/2021  ? GLUCOSE 95 09/04/2021  ? ALT 30 09/04/2021  ? AST 19 09/04/2021  ? NA 138 09/04/2021  ? K 4.1 09/04/2021  ? CL 103 09/04/2021  ? CREATININE 0.86 09/04/2021  ? BUN 18 09/04/2021  ? CO2 26 09/04/2021  ? TSH 1.26 01/14/2020  ? PSA 1.83 09/04/2021  ? HGBA1C 5.5 09/04/2021  ? ? ?DG INJECT DIAG/THERA/INC NEEDLE/CATH/PLC EPI/LUMB/SAC W/IMG ? ?Result Date: 10/25/2021 ?CLINICAL DATA:  Low back and bilateral lower extremity pain to the feet. L4-S1 fusion with posterior decompression. Patent canal and foramina at these levels. Adjacent level degenerative change at L3-L4 with similar mild canal stenosis and mild to moderate bilateral foraminal stenosis. EXAM: CAUDAL  EPIDURAL INJECTION UNDER FLUOROSCOPIC GUIDANCE FLUOROSCOPY: Radiation Exposure Index (as provided by the fluoroscopic device): 3.9 mGy air Kerma PROCEDURE: After a thorough discussion of risks and benefits of the procedure including bleeding, infection, injury to nerves, blood vessels, and adjacent structures, written and oral informed consent was obtained. Consent was obtained. Specific risks to this procedure included injury to the terminal portion of the thecal sac and/or nerve roots. Utilizing a caudal approach, the skin overlying the sacral  hiatus was cleansed and anesthetized. A 20 gauge Crawford epidural needle was advanced into the sacral epidural space. Injection of Omnipaque 180 shows a good epidural pattern with spread up to L5-S1. No vascular or subarachnoid opacification is seen. '80mg'$  of Depo-Medrol mixed with 3 cc of normal saline and 5cc of 1% Lidocaine were instilled. The procedure was well-tolerated, and the patient was discharged thirty minutes following the injection in good condition. IMPRESSION: Technically successful  caudal epidural injection. Electronically Signed   By: Lucrezia Europe M.D.   On: 10/25/2021 15:42  ? ? ?Assessment & Plan:  ? ?Problem List Items Addressed This Visit   ? ? Low back pain  ?  Chronic severe LBP - it is too expensive for the patient to continue to go to pain clinic.  I will have to take over his pain prescriptions.  He had a UDS screen a couple months ago.  Was signed a pain contract.  Currently on oxycodone 10 mg 3 times daily. ?Blue-Emu cream -- was recommended to use 2-3 times a day ??TENS unit with EMS function ?HOKA bondi 3 ?Pt had a low back injection L4-5, S1 ?  ?  ? Relevant Medications  ? Oxycodone HCl 10 MG TABS  ? Oxycodone HCl 10 MG TABS  ? Low vitamin B12 level  ?  Re-start B12 ?Risks associated with treatment noncompliance were discussed. Compliance was encouraged. ? ? ? ?  ?  ? Insomnia  ?  Try Cymbalta at hs again ?  ?  ? Chronic pain syndrome  ?   Chronic severe LBP - it is too expensive for the patient to continue to go to pain clinic.  I will have to take over his pain prescriptions.  He had a UDS screen a couple months ago.  Was signed a pain contract.  Cur

## 2021-11-07 NOTE — Assessment & Plan Note (Signed)
Try Cymbalta at hs again ?

## 2021-11-07 NOTE — Assessment & Plan Note (Addendum)
Chronic severe LBP - it is too expensive for the patient to continue to go to pain clinic.  I will have to take over his pain prescriptions.  He had a UDS screen a couple months ago.  Was signed a pain contract.  Currently on oxycodone 10 mg 3 times daily. ?Blue-Emu cream -- was recommended to use 2-3 times a day ??TENS unit with EMS function ?HOKA bondi 3 ?Pt had a low back injection L4-5, S1 ?

## 2021-11-08 ENCOUNTER — Encounter: Payer: Self-pay | Admitting: Internal Medicine

## 2021-11-09 ENCOUNTER — Other Ambulatory Visit: Payer: Self-pay | Admitting: Internal Medicine

## 2021-11-09 ENCOUNTER — Encounter: Payer: Commercial Managed Care - HMO | Admitting: Physical Therapy

## 2021-11-09 NOTE — Progress Notes (Signed)
x

## 2021-11-09 NOTE — Telephone Encounter (Signed)
Rx was filled on yesterday.. Will not let me refuse and send back. Pls refuse and send back.Marland KitchenJohny Vaughn ?

## 2021-11-10 ENCOUNTER — Telehealth: Payer: Self-pay

## 2021-11-10 MED ORDER — OXYCODONE HCL 10 MG PO TABS: 10.0000 mg | ORAL_TABLET | Freq: Three times a day (TID) | ORAL | 0 refills | Status: DC

## 2021-11-10 NOTE — Telephone Encounter (Signed)
sent 

## 2021-11-10 NOTE — Addendum Note (Signed)
Addended by: Binnie Rail on: 11/10/2021 02:34 PM ? ? Modules accepted: Orders ? ?

## 2021-11-10 NOTE — Telephone Encounter (Signed)
Pharmacy called and has stated the pt rx for Oxycodone HCl 10 MG TABS  ? ?Take 1 tablet (10 mg total) by mouth 3 (three) times daily. Please fill on or after 11/10/21 was not sent in successfully. ? ?The only rx they did get was the one dated to fill on or after 12/11/2021. ? ?The may refill need to be resent for pt to be bale to pick up. ? ? ?

## 2021-11-11 ENCOUNTER — Encounter: Payer: Self-pay | Admitting: Family Medicine

## 2021-11-20 ENCOUNTER — Ambulatory Visit: Payer: Commercial Managed Care - HMO | Admitting: Physical Therapy

## 2021-11-20 ENCOUNTER — Encounter: Payer: Self-pay | Admitting: Physical Therapy

## 2021-11-20 DIAGNOSIS — M6281 Muscle weakness (generalized): Secondary | ICD-10-CM

## 2021-11-20 DIAGNOSIS — M25611 Stiffness of right shoulder, not elsewhere classified: Secondary | ICD-10-CM

## 2021-11-20 DIAGNOSIS — R600 Localized edema: Secondary | ICD-10-CM

## 2021-11-20 DIAGNOSIS — M25511 Pain in right shoulder: Secondary | ICD-10-CM

## 2021-11-20 DIAGNOSIS — R293 Abnormal posture: Secondary | ICD-10-CM

## 2021-11-20 NOTE — Therapy (Addendum)
OUTPATIENT PHYSICAL THERAPY TREATMENT NOTE   Patient Name: Adam Vaughn MRN: [id number] DOB:[birth year], 46 y.o., male Today's Date: 11/20/2021  PCP: Cassandria Anger, MD REFERRING PROVIDER: Meredith Pel, MD  END OF SESSION:   PT End of Session - 11/20/21 1314     Visit Number 4    Number of Visits 16    Date for PT Re-Evaluation 12/07/21    Authorization Type [employer]    PT Start Time 1304    PT Stop Time 2248    PT Time Calculation (min) 41 min    Activity Tolerance Patient tolerated treatment well    Behavior During Therapy WFL for tasks assessed/performed              Past Medical History:  Diagnosis Date   Arthritis    Chronic back pain    Family history of colon cancer    sister   GERD (gastroesophageal reflux disease)    History of kidney stones    Hypertension    Kidney stones    Past Surgical History:  Procedure Laterality Date   BACK SURGERY  04/11/2020   CHOLECYSTECTOMY N/A 10/01/2020   Procedure: LAPAROSCOPIC CHOLECYSTECTOMY;  Surgeon: Jovita Kussmaul, MD;  Location: [facility];  Service: General;  Laterality: N/A;   COLONOSCOPY  1991   IBS   endocolon  09/15/2021   [place], GERD   HAND SURGERY Bilateral    SHOULDER SURGERY Right 08/2021   SPINAL CORD STIMULATOR IMPLANT  2022   SPINAL CORD STIMULATOR REMOVAL  2022   TONSILLECTOMY     VASECTOMY     Patient Active Problem List   Diagnosis Date Noted   Chronic pain syndrome 11/07/2021   Piriformis syndrome 11/07/2021   Bladder neck obstruction 09/04/2021   Asthmatic bronchitis 09/04/2021   Pain in right shoulder 06/29/2021   Meteorism 06/01/2021   Sinus congestion 02/28/2021   Vertigo 02/17/2021   Headache 02/17/2021   Fever 02/17/2021   Falls 02/17/2021   Allergic rhinitis 10/25/2020   Family history of acute polio 10/25/2020   Chest pain, atypical 10/10/2020   S/P laparoscopic cholecystectomy 10/10/2020   Hyperglycemia 10/10/2020   Acute cholecystitis 09/30/2020   Insomnia  09/26/2020   Knee pain 06/23/2020   Low vitamin B12 level 05/31/2020   Ankle sprain 05/31/2020   Constipation by delayed colonic transit 05/31/2020   Degenerative spondylolisthesis 04/11/2020   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   DDD (degenerative disc disease), lumbar 01/20/2020   Left lumbar radiculitis 01/20/2020   Vitamin D deficiency 01/15/2020   Low back pain 01/12/2020   IBS (irritable bowel syndrome) 01/12/2020   Nephrolithiasis 01/12/2020   Erectile dysfunction 01/12/2020   GERD (gastroesophageal reflux disease) 01/12/2020    REFERRING DIAG: M25.511 (ICD-10-CM) - Right shoulder pain, unspecified chronicity   THERAPY DIAG:  Acute pain of right shoulder  Stiffness of right shoulder, not elsewhere classified  Muscle weakness (generalized)  Abnormal posture  Localized edema  PERTINENT HISTORY: OA, HTN, back surgery 2021  PRECAUTIONS: Fall; Shoulder Now okay for progressing ROM and light strengthening activities  SUBJECTIVE: denies pain upon arrival  PAIN:  Are you having pain? Yes: NPRS scale: 0, up to 3/10 Pain location: Rt shoulder Pain description: sore aching Aggravating factors: when he moves it different ways Relieving factors: rest   OBJECTIVE: (objective measures completed at initial evaluation unless otherwise dated)   OBJECTIVE:    PATIENT SURVEYS:  10/12/21: FOTO 48 (predicted 68)  POSTURE: Rounded shoulders   UPPER EXTREMITY ROM:    Passive ROM Right 10/12/2021 Left 10/12/2021  Shoulder flexion 90    Shoulder extension      Shoulder abduction 90    Shoulder adduction      Shoulder internal rotation 60 (in 55 deg abdct)    Shoulder external rotation 40 (In 55 deg abdct)    Elbow flexion      Elbow extension      Wrist flexion      Wrist extension      Wrist ulnar deviation      Wrist radial deviation      Wrist pronation      Wrist supination      (Blank rows  = not tested)   Active ROM Right 10/12/2021 Left 10/12/2021 Right 11/01/21 Right 10/31/21  Shoulder flexion 90    135 155  Shoulder extension        Shoulder abduction 90    126 135  Shoulder adduction        Shoulder internal rotation     80 WFL  Shoulder external rotation     59 WFL  Elbow flexion        Elbow extension        Wrist flexion        Wrist extension        Wrist ulnar deviation        Wrist radial deviation        Wrist pronation        Wrist supination        (Blank rows = not tested)   UPPER EXTREMITY MMT: deferred due to post op status   MMT Right 11/20/2021   Shoulder flexion  4    Shoulder extension      Shoulder abduction  4    Shoulder adduction      Shoulder internal rotation 4     Shoulder external rotation 4     Middle trapezius      Lower trapezius      Elbow flexion      Elbow extension      Wrist flexion      Wrist extension      Wrist ulnar deviation      Wrist radial deviation      Wrist pronation      Wrist supination      Grip strength (lbs)      (Blank rows = not tested)               TODAY'S TREATMENT: 11/20/21 Therex:     Sitting: Pulleys x 2 min; flexion and scaption each Bilat shoulder flexion 2# 2X10 Bilat shoulder abduction 2#2X10 Rt shoulder bicep curl 5# 3X10 Bilat tricep extension with L4 band around neck 2X15 OH ball stability rolls 2# X 15 up/down, lateral, circles CW and CCW     Aerobic: UBE L3 x 6 min (3 min each direction)      Standing: Rows L4 2x 15 Shoulder extensions L4 2X10 IR with L3 band 2x 15 reps ER with L3 band 2 x 15 reps Wall push ups 2X10  11/01/21 Therex:      Supine: Rt shoulder flexion x 10 reps; 2# with 5 sec hold at end range     Sitting: Pulleys x 2 min; flexion and scaption each     Aerobic: UBE L2.5 x 6 min (3 min each direction)      Standing: Rows L4 x 10;  5 sec hold IR with L3 band x 10 reps ER with L3 band x 10 reps Manual: Rt shoulder PROM all motions to  tolerance  10/26/21 Therex:      Sitting: Sustained flexion hold at 90 deg 3x30 sec, Rt Sustained abduction hold at 90 deg on Rt;  3x30 sec Seated AA flexion Rt shoulder Discussed HEP, will remove pendulums as pt is unable due to back.   Self-Care: Reinforced protocol restrictions including nothing above shoulder height and no cross body adduction.  Pt verbalized understanding. Manual: Rt shoulder PROM all motions within protocol restrictions.     10/12/21               Therex: See pt instructions, did change standing pendulums to sitting due to back pain and buckling observed in clinic needing to reach for support with LUE.  Performed deltoid isometrics with LUE support or wall support.   PATIENT EDUCATION: Education details: HEP Person educated: Patient Education method: Consulting civil engineer, Media planner, and Handouts Education comprehension: verbalized understanding, returned demonstration, and needs further education     HOME EXERCISE PROGRAM: Access Code: [email or web address] URL: [email or web address] Date: 11/01/2021 Prepared by: Faustino Congress  Exercises - Standing Circular Shoulder Pendulum Supported with Arm Bent  - 3-5 x daily - 7 x weekly - 1-2 sets - 10 reps - Standing Horizontal Shoulder Pendulum Supported with Arm Bent  - 3-5 x daily - 7 x weekly - 1-2 sets - 10 reps - Isometric Shoulder Abduction at Wall  - 3-5 x daily - 7 x weekly - 1-2 sets - 10 reps - 5 sec hold - Standing Isometric Shoulder Extension with Doorway - Arm Bent  - 3-5 x daily - 7 x weekly - 1-2 sets - 10 reps - 5 sec hold - Isometric Shoulder Flexion at Wall  - 1 x daily - 7 x weekly - 3 sets - 10 reps - 5 sec hold - Supine Shoulder Flexion Extension Full Range AROM  - 1-2 x daily - 7 x weekly - 1-2 sets - 10 reps - 5 sec hold - Standing Row with Anchored Resistance  - 1-2 x daily - 7 x weekly - 1-2 sets - 10 reps - 5 se hold - Shoulder Internal Rotation with Resistance  - 1-2 x daily - 7 x  weekly - 1-2 sets - 10 reps - Shoulder External Rotation with Anchored Resistance  - 1-2 x daily - 7 x weekly - 1-2 sets - 10 reps   ASSESSMENT:   CLINICAL IMPRESSION: Pt is 9 weeks post op now and not having much pain so I progressed his overall strength program for Right shoulder. His ROM is now [facility] but he does still lack strengthening. I added more strengthening into his HEP to perform as well.   OBJECTIVE IMPAIRMENTS decreased ROM, decreased strength, hypomobility, increased fascial restrictions, increased muscle spasms, impaired UE functional use, and pain.    ACTIVITY LIMITATIONS cleaning, community activity, driving, meal prep, and laundry.    PERSONAL FACTORS 3+ comorbidities: OA, HTN, hx back surgery Nov 2021  are also affecting patient's functional outcome.      REHAB POTENTIAL: Good   CLINICAL DECISION MAKING: Evolving/moderate complexity   EVALUATION COMPLEXITY: Moderate     GOALS: Goals reviewed with patient? Yes   SHORT TERM GOALS: Target date: 11/09/2021   Independent with initial HEP Goal status: MET, relays compliance   2.  Rt shoulder active flexion and abduction to 90 deg for improved function Goal status: MET, see  above measurements, now >135 deg since 11/01/21      LONG TERM GOALS: Target date: 12/07/2021   Independent with final HEP Goal status: ongoing   2.  FOTO score improved to 68 Goal status: ongoing  3.  Rt shoulder AROM improved to [facility] for improved mobility and function Goal status: Iongoing   4.  Report pain < 3/10 with activity for improved function Goal status: Iongoing   5.  Rt shoulder strength at least 4/5 for improved function Goal status: ongoing       PLAN: PT FREQUENCY: 1-2x/week   PT DURATION: 8 weeks   PLANNED INTERVENTIONS: Therapeutic exercises, Therapeutic activity, Neuromuscular re-education, Patient/Family education, Joint mobilization, Dry Needling, Electrical stimulation, Cryotherapy, Moist heat, Taping,  Vasopneumatic device, and Manual therapy   PLAN FOR NEXT SESSION: strength progressions as tolerated. avoid forward bending activities due to back     Elsie Ra, PT, DPT 11/20/21 1:15 PM

## 2021-11-21 ENCOUNTER — Ambulatory Visit: Payer: Commercial Managed Care - HMO | Admitting: Family Medicine

## 2021-11-21 ENCOUNTER — Ambulatory Visit (INDEPENDENT_AMBULATORY_CARE_PROVIDER_SITE_OTHER): Payer: Commercial Managed Care - HMO

## 2021-11-21 VITALS — BP 142/88 | HR 79 | Ht 68.0 in | Wt 197.8 lb

## 2021-11-21 DIAGNOSIS — M25562 Pain in left knee: Secondary | ICD-10-CM

## 2021-11-21 NOTE — Progress Notes (Signed)
   I, Peterson Lombard, LAT, ATC acting as a scribe for [name], MD.  Adam Vaughn is a 46 y.o. male who presents to [place] at [facility] today for L knee pain. Pt was last seen by Dr. Georgina Snell on 10/19/21 and had bilat knee steroid injections and a lumbar ESI was ordered to treat his lumbar radiculopathy. Pt completed the Gelsyn series bilat on 08/14/21. Today, pt reports he fell down the stairs on Saturday, 5/20, hurting his L knee. Pt locates pain to both the medial and lateral aspects of his L knee.  L knee swelling: no Mechanical symptoms: yes Treatments tried: ice  Dx imaging: 06/28/20 R and L knee XR  Pertinent review of systems: No fevers or chills  Relevant historical information: Lumbar radiculopathy   Exam:  BP (!) 142/88   Pulse 79   Ht '5\' 8"'$  (1.727 m)   Wt 197 lb 12.8 oz (89.7 kg)   SpO2 97%   BMI 30.08 kg/m  General: Well Developed, well nourished, and in no acute distress.   MSK: Left knee: Mature scar anterior knee. No abrasions or visible contusion. Normal motion with crepitation.  Tender palpation medial joint line. Stable ligamentous exam. Intact strength. Positive McMurray's test.  Right knee normal-appearing Normal motion. Stable ligamentous exam. Intact strength. Positive McMurray's test.    Lab and Radiology Results  X-ray images left knee obtained today personally and independently interpreted Mild DJD.  No acute fractures. Await formal radiology review     Assessment and Plan: 46 y.o. male with exacerbation of left chronic knee pain after a fall.  No fractures are visible per my read however radiology overread is still pending.  Suspect exacerbation of underlying DJD or perhaps meniscus injury.  Plan for a bit of watchful waiting and advancing activity as tolerated.  Additionally will work on authorization of Zilretta injections.  He had recently steroid injections and about 3 months ago completed a Gelsyn series.   Zilretta is the next step.     PDMP not reviewed this encounter. Orders Placed This Encounter  Procedures   DG Knee AP/LAT W/Sunrise Left    Standing Status:   Future    Number of Occurrences:   1    Standing Expiration Date:   12/22/2021    Order Specific Question:   Reason for Exam (SYMPTOM  OR DIAGNOSIS REQUIRED)    Answer:   left knee pain    Order Specific Question:   Preferred imaging location?    Answer:   [facility]   No orders of the defined types were placed in this encounter.    Discussed warning signs or symptoms. Please see discharge instructions. Patient expresses understanding.   The above documentation has been reviewed and is accurate and complete [name], M.D.

## 2021-11-21 NOTE — Patient Instructions (Addendum)
Thank you for coming in today.   Please get an Xray today before you leave   We will work to [employer]. You will hear from our office once approved.

## 2021-11-23 ENCOUNTER — Encounter: Payer: Self-pay | Admitting: Family Medicine

## 2021-11-23 NOTE — Progress Notes (Signed)
Left knee x-ray is relatively normal-appearing to radiology.

## 2021-12-06 ENCOUNTER — Encounter: Payer: Self-pay | Admitting: Physical Therapy

## 2021-12-06 ENCOUNTER — Ambulatory Visit: Payer: Commercial Managed Care - HMO | Admitting: Physical Therapy

## 2021-12-06 DIAGNOSIS — M6281 Muscle weakness (generalized): Secondary | ICD-10-CM | POA: Diagnosis not present

## 2021-12-06 DIAGNOSIS — R600 Localized edema: Secondary | ICD-10-CM

## 2021-12-06 DIAGNOSIS — M25511 Pain in right shoulder: Secondary | ICD-10-CM | POA: Diagnosis not present

## 2021-12-06 DIAGNOSIS — R293 Abnormal posture: Secondary | ICD-10-CM | POA: Diagnosis not present

## 2021-12-06 DIAGNOSIS — M25611 Stiffness of right shoulder, not elsewhere classified: Secondary | ICD-10-CM | POA: Diagnosis not present

## 2021-12-06 NOTE — Therapy (Addendum)
OUTPATIENT PHYSICAL THERAPY TREATMENT NOTE   Patient Name: Adam Vaughn MRN: [id number] DOB:17-Apr-1976, 46 y.o., male Today's Date: 12/06/2021  PCP: Cassandria Anger, MD REFERRING PROVIDER: Meredith Pel, MD  END OF SESSION:   PT End of Session - 12/06/21 1308     Visit Number 5    Number of Visits 16    Date for PT Re-Evaluation 12/07/21    Authorization Type [employer]    PT Start Time 1304    PT Stop Time 1342    PT Time Calculation (min) 38 min    Activity Tolerance Patient tolerated treatment well    Behavior During Therapy WFL for tasks assessed/performed              Past Medical History:  Diagnosis Date   Arthritis    Chronic back pain    Family history of colon cancer    sister   GERD (gastroesophageal reflux disease)    History of kidney stones    Hypertension    Kidney stones    Past Surgical History:  Procedure Laterality Date   BACK SURGERY  04/11/2020   CHOLECYSTECTOMY N/A 10/01/2020   Procedure: LAPAROSCOPIC CHOLECYSTECTOMY;  Surgeon: Autumn Messing III, MD;  Location: [facility];  Service: General;  Laterality: N/A;   COLONOSCOPY  1991   IBS   endocolon  09/15/2021   Ramirez-Perez, GERD   HAND SURGERY Bilateral    SHOULDER SURGERY Right 08/2021   SPINAL CORD STIMULATOR IMPLANT  2022   SPINAL CORD STIMULATOR REMOVAL  2022   TONSILLECTOMY     VASECTOMY     Patient Active Problem List   Diagnosis Date Noted   Chronic pain syndrome 11/07/2021   Piriformis syndrome 11/07/2021   Bladder neck obstruction 09/04/2021   Asthmatic bronchitis 09/04/2021   Pain in right shoulder 06/29/2021   Meteorism 06/01/2021   Sinus congestion 02/28/2021   Vertigo 02/17/2021   Headache 02/17/2021   Fever 02/17/2021   Falls 02/17/2021   Allergic rhinitis 10/25/2020   Family history of acute polio 10/25/2020   Chest pain, atypical 10/10/2020   S/P laparoscopic cholecystectomy 10/10/2020   Hyperglycemia 10/10/2020   Acute cholecystitis 09/30/2020   Insomnia  09/26/2020   Knee pain 06/23/2020   Low vitamin B12 level 05/31/2020   Ankle sprain 05/31/2020   Constipation by delayed colonic transit 05/31/2020   Degenerative spondylolisthesis 04/11/2020   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   DDD (degenerative disc disease), lumbar 01/20/2020   Left lumbar radiculitis 01/20/2020   Vitamin D deficiency 01/15/2020   Low back pain 01/12/2020   IBS (irritable bowel syndrome) 01/12/2020   Nephrolithiasis 01/12/2020   Erectile dysfunction 01/12/2020   GERD (gastroesophageal reflux disease) 01/12/2020    REFERRING DIAG: M25.511 (ICD-10-CM) - Right shoulder pain, unspecified chronicity   THERAPY DIAG:  Acute pain of right shoulder  Stiffness of right shoulder, not elsewhere classified  Muscle weakness (generalized)  Abnormal posture  Localized edema  PERTINENT HISTORY: OA, HTN, back surgery 2021  PRECAUTIONS: Fall; Shoulder Now okay for progressing ROM and light strengthening activities  Onset date: s/p Rt shoulder biceps tenodesis, labral repair and debridement on 09/11/21.  SUBJECTIVE: He reports mild overall pain and popping in Rt shoulder. He gets some spasms in his bicep. He has complaints that he cannot throw ball with his child  PAIN:  Are you having pain? Yes: NPRS scale: 3/10 Pain location: Rt shoulder Pain description: sore aching Aggravating factors: when he moves it  different ways Relieving factors: rest   OBJECTIVE: (objective measures completed at initial evaluation unless otherwise dated)   OBJECTIVE:    PATIENT SURVEYS:  10/12/21: FOTO 48 (predicted 68)                                  POSTURE: Rounded shoulders   UPPER EXTREMITY ROM:    Passive ROM Right 10/12/2021 Left 10/12/2021  Shoulder flexion 90    Shoulder extension      Shoulder abduction 90    Shoulder adduction      Shoulder internal rotation 60 (in 55 deg abdct)    Shoulder external rotation 40 (In 55 deg  abdct)    Elbow flexion      Elbow extension      Wrist flexion      Wrist extension      Wrist ulnar deviation      Wrist radial deviation      Wrist pronation      Wrist supination      (Blank rows = not tested)   Active ROM Right 10/12/2021 Left 10/12/2021 Right 11/01/21 Right 10/31/21  Shoulder flexion 90    135 155  Shoulder extension        Shoulder abduction 90    126 135  Shoulder adduction        Shoulder internal rotation     80 WFL  Shoulder external rotation     59 WFL  Elbow flexion        Elbow extension        Wrist flexion        Wrist extension        Wrist ulnar deviation        Wrist radial deviation        Wrist pronation        Wrist supination        (Blank rows = not tested)   UPPER EXTREMITY MMT: deferred due to post op status   MMT Right 11/20/2021 Right 12/06/21  Shoulder flexion  4 4   Shoulder extension      Shoulder abduction  4 4   Shoulder adduction      Shoulder internal rotation 4  4   Shoulder external rotation 4  4   Middle trapezius      Lower trapezius      Elbow flexion      Elbow extension      Wrist flexion      Wrist extension      Wrist ulnar deviation      Wrist radial deviation      Wrist pronation      Wrist supination      Grip strength (lbs)      (Blank rows = not tested)               TODAY'S TREATMENT: 12/06/21 Therex:     Sitting: Pulleys x 2 min; flexion and abduction each Bilat shoulder flexion 3# 2X10 Bilat shoulder abduction 3#2X10 Rt shoulder bicep curl 5# X 10 pronated, X 10 supinated, X 10 hammer curl Bilat shoulder OH press 3# 2X10 Bilat shoulder Horizontal abduction green 2X10 Bilat shoulder ER green 2X10 Row machine bilat 25# 2X15 Lat pull machine bilat 20# 2X10         Standing: ER and IR at 90/90 flexion/abd with L3 band 2 x 15 reps (to help with throwing motion)  Wall push ups 2X15 Doorway stretch mid 10 sec X 10 reps   11/20/21 Therex:     Sitting: Pulleys x 2 min; flexion and scaption  each Bilat shoulder flexion 2# 2X10 Bilat shoulder abduction 2#2X10 Rt shoulder bicep curl 5# 3X10 Bilat tricep extension with L4 band around neck 2X15 OH ball stability rolls 2# X 15 up/down, lateral, circles CW and CCW     Aerobic: UBE L3 x 6 min (3 min each direction)      Standing: Rows L4 2x 15 Shoulder extensions L4 2X10 IR with L3 band 2x 15 reps ER with L3 band 2 x 15 reps Wall push ups 2X10  11/01/21 Therex:      Supine: Rt shoulder flexion x 10 reps; 2# with 5 sec hold at end range     Sitting: Pulleys x 2 min; flexion and scaption each     Aerobic: UBE L2.5 x 6 min (3 min each direction)      Standing: Rows L4 x 10; 5 sec hold IR with L3 band x 10 reps ER with L3 band x 10 reps Manual: Rt shoulder PROM all motions to tolerance  10/26/21 Therex:      Sitting: Sustained flexion hold at 90 deg 3x30 sec, Rt Sustained abduction hold at 90 deg on Rt;  3x30 sec Seated AA flexion Rt shoulder Discussed HEP, will remove pendulums as pt is unable due to back.   Self-Care: Reinforced protocol restrictions including nothing above shoulder height and no cross body adduction.  Pt verbalized understanding. Manual: Rt shoulder PROM all motions within protocol restrictions.     10/12/21               Therex: See pt instructions, did change standing pendulums to sitting due to back pain and buckling observed in clinic needing to reach for support with LUE.  Performed deltoid isometrics with LUE support or wall support.   PATIENT EDUCATION: Education details: HEP Person educated: Patient Education method: Programmer, multimedia, Facilities manager, and Handouts Education comprehension: verbalized understanding, returned demonstration, and needs further education     HOME EXERCISE PROGRAM: Access Code: [email or web address] URL: [email or web address] Date: 11/01/2021 Prepared by: Moshe Cipro  Exercises - Standing Circular Shoulder Pendulum Supported with Arm Bent  - 3-5 x  daily - 7 x weekly - 1-2 sets - 10 reps - Standing Horizontal Shoulder Pendulum Supported with Arm Bent  - 3-5 x daily - 7 x weekly - 1-2 sets - 10 reps - Isometric Shoulder Abduction at Wall  - 3-5 x daily - 7 x weekly - 1-2 sets - 10 reps - 5 sec hold - Standing Isometric Shoulder Extension with Doorway - Arm Bent  - 3-5 x daily - 7 x weekly - 1-2 sets - 10 reps - 5 sec hold - Isometric Shoulder Flexion at Wall  - 1 x daily - 7 x weekly - 3 sets - 10 reps - 5 sec hold - Supine Shoulder Flexion Extension Full Range AROM  - 1-2 x daily - 7 x weekly - 1-2 sets - 10 reps - 5 sec hold - Standing Row with Anchored Resistance  - 1-2 x daily - 7 x weekly - 1-2 sets - 10 reps - 5 se hold - Shoulder Internal Rotation with Resistance  - 1-2 x daily - 7 x weekly - 1-2 sets - 10 reps - Shoulder External Rotation with Anchored Resistance  - 1-2 x daily - 7 x weekly - 1-2 sets - 10 reps  ASSESSMENT:   CLINICAL IMPRESSION: He is doing reasonably well to this point but still with some pain, popping, and weakness. We will continue to work to address these deficits to this pain tolerance to improve functional use of his Rt arm.   OBJECTIVE IMPAIRMENTS decreased ROM, decreased strength, hypomobility, increased fascial restrictions, increased muscle spasms, impaired UE functional use, and pain.    ACTIVITY LIMITATIONS cleaning, community activity, driving, meal prep, and laundry.    PERSONAL FACTORS 3+ comorbidities: OA, HTN, hx back surgery Nov 2021  are also affecting patient's functional outcome.      REHAB POTENTIAL: Good   CLINICAL DECISION MAKING: Evolving/moderate complexity   EVALUATION COMPLEXITY: Moderate     GOALS: Goals reviewed with patient? Yes   SHORT TERM GOALS: Target date: 11/09/2021   Independent with initial HEP Goal status: MET, relays compliance   2.  Rt shoulder active flexion and abduction to 90 deg for improved function Goal status: MET, see above measurements, now >135  deg since 11/01/21      LONG TERM GOALS: Target date: 12/07/2021   Independent with final HEP Goal status: INITIAL   2.  FOTO score improved to 68 Goal status: INITIAL   3.  Rt shoulder AROM improved to [facility] for improved mobility and function Goal status: INIITAL   4.  Report pain < 3/10 with activity for improved function Goal status: INITIAL   5.  Rt shoulder strength at least 4/5 for improved function Goal status: INITIAL       PLAN: PT FREQUENCY: 1-2x/week   PT DURATION: 8 weeks   PLANNED INTERVENTIONS: Therapeutic exercises, Therapeutic activity, Neuromuscular re-education, Patient/Family education, Joint mobilization, Dry Needling, Electrical stimulation, Cryotherapy, Moist heat, Taping, Vasopneumatic device, and Manual therapy   PLAN FOR NEXT SESSION: He will need recert next visit. strength progressions as tolerated. avoid forward bending activities due to back     Elsie Ra, PT, DPT 12/06/21 1:38 PM

## 2021-12-08 ENCOUNTER — Encounter: Payer: Self-pay | Admitting: Internal Medicine

## 2021-12-11 ENCOUNTER — Encounter: Payer: Self-pay | Admitting: Internal Medicine

## 2021-12-12 ENCOUNTER — Encounter: Payer: Self-pay | Admitting: Physical Therapy

## 2021-12-12 ENCOUNTER — Other Ambulatory Visit: Payer: Commercial Managed Care - HMO

## 2021-12-12 ENCOUNTER — Ambulatory Visit: Payer: Commercial Managed Care - HMO | Admitting: Physical Therapy

## 2021-12-12 ENCOUNTER — Encounter: Payer: Self-pay | Admitting: Gastroenterology

## 2021-12-12 DIAGNOSIS — M25511 Pain in right shoulder: Secondary | ICD-10-CM

## 2021-12-12 DIAGNOSIS — M6281 Muscle weakness (generalized): Secondary | ICD-10-CM

## 2021-12-12 DIAGNOSIS — R600 Localized edema: Secondary | ICD-10-CM

## 2021-12-12 DIAGNOSIS — M25611 Stiffness of right shoulder, not elsewhere classified: Secondary | ICD-10-CM

## 2021-12-12 DIAGNOSIS — K2970 Gastritis, unspecified, without bleeding: Secondary | ICD-10-CM

## 2021-12-12 DIAGNOSIS — R293 Abnormal posture: Secondary | ICD-10-CM | POA: Diagnosis not present

## 2021-12-12 DIAGNOSIS — K589 Irritable bowel syndrome without diarrhea: Secondary | ICD-10-CM

## 2021-12-12 DIAGNOSIS — K219 Gastro-esophageal reflux disease without esophagitis: Secondary | ICD-10-CM

## 2021-12-12 NOTE — Therapy (Signed)
OUTPATIENT PHYSICAL THERAPY TREATMENT NOTE RECERTIFICATION   Patient Name: Adam Vaughn MRN: [id number] DOB:1975-11-09, 46 y.o., male Today's Date: 12/12/2021  PCP: Cassandria Anger, MD REFERRING PROVIDER: Meredith Pel, MD  END OF SESSION:   PT End of Session - 12/12/21 1253     Visit Number 6    Number of Visits 16    Date for PT Re-Evaluation 01/09/22    Authorization Type [employer]    PT Start Time 1259    PT Stop Time 4098    PT Time Calculation (min) 38 min    Activity Tolerance Patient tolerated treatment well    Behavior During Therapy WFL for tasks assessed/performed              Past Medical History:  Diagnosis Date   Arthritis    Chronic back pain    Family history of colon cancer    sister   GERD (gastroesophageal reflux disease)    History of kidney stones    Hypertension    Kidney stones    Past Surgical History:  Procedure Laterality Date   BACK SURGERY  04/11/2020   CHOLECYSTECTOMY N/A 10/01/2020   Procedure: LAPAROSCOPIC CHOLECYSTECTOMY;  Surgeon: Jovita Kussmaul, MD;  Location: [facility];  Service: General;  Laterality: N/A;   COLONOSCOPY  1991   IBS   endocolon  09/15/2021   Lewis, GERD   HAND SURGERY Bilateral    SHOULDER SURGERY Right 08/2021   SPINAL CORD STIMULATOR IMPLANT  2022   SPINAL CORD STIMULATOR REMOVAL  2022   TONSILLECTOMY     VASECTOMY     Patient Active Problem List   Diagnosis Date Noted   Chronic pain syndrome 11/07/2021   Piriformis syndrome 11/07/2021   Bladder neck obstruction 09/04/2021   Asthmatic bronchitis 09/04/2021   Pain in right shoulder 06/29/2021   Meteorism 06/01/2021   Sinus congestion 02/28/2021   Vertigo 02/17/2021   Headache 02/17/2021   Fever 02/17/2021   Falls 02/17/2021   Allergic rhinitis 10/25/2020   Family history of acute polio 10/25/2020   Chest pain, atypical 10/10/2020   S/P laparoscopic cholecystectomy 10/10/2020   Hyperglycemia 10/10/2020   Acute cholecystitis  09/30/2020   Insomnia 09/26/2020   Knee pain 06/23/2020   Low vitamin B12 level 05/31/2020   Ankle sprain 05/31/2020   Constipation by delayed colonic transit 05/31/2020   Degenerative spondylolisthesis 04/11/2020   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   DDD (degenerative disc disease), lumbar 01/20/2020   Left lumbar radiculitis 01/20/2020   Vitamin D deficiency 01/15/2020   Low back pain 01/12/2020   IBS (irritable bowel syndrome) 01/12/2020   Nephrolithiasis 01/12/2020   Erectile dysfunction 01/12/2020   GERD (gastroesophageal reflux disease) 01/12/2020    REFERRING DIAG: M25.511 (ICD-10-CM) - Right shoulder pain, unspecified chronicity   THERAPY DIAG:  Acute pain of right shoulder - Plan: PT plan of care cert/re-cert  Stiffness of right shoulder, not elsewhere classified - Plan: PT plan of care cert/re-cert  Muscle weakness (generalized) - Plan: PT plan of care cert/re-cert  Abnormal posture - Plan: PT plan of care cert/re-cert  Localized edema - Plan: PT plan of care cert/re-cert  PERTINENT HISTORY: OA, HTN, back surgery 2021  PRECAUTIONS: Fall; Shoulder Now okay for progressing ROM and light strengthening activities  Onset date: s/p Rt shoulder biceps tenodesis, labral repair and debridement on 09/11/21.  SUBJECTIVE: no pain at rest, spasms seem to be decreasing; reports he fell twice from Lt knee giving  out on him  PAIN:  Are you having pain? Yes: NPRS scale: 0/10 Pain location: Rt shoulder Pain description: sore aching Aggravating factors: when he moves it different ways Relieving factors: rest   OBJECTIVE: (objective measures completed at initial evaluation unless otherwise dated)   OBJECTIVE:    PATIENT SURVEYS:  10/12/21: FOTO 48 (predicted 68) 12/12/21: FOTO 66                                  POSTURE: Rounded shoulders   UPPER EXTREMITY ROM:    Passive ROM Right 10/12/2021  Shoulder flexion 90  Shoulder  extension    Shoulder abduction 90  Shoulder adduction    Shoulder internal rotation 60 (in 55 deg abdct)  Shoulder external rotation 40 (In 55 deg abdct)  Elbow flexion    Elbow extension    Wrist flexion    Wrist extension    Wrist ulnar deviation    Wrist radial deviation    Wrist pronation    Wrist supination    (Blank rows = not tested)   Active ROM Right 10/12/2021 Left 10/12/2021 Right 11/01/21 Right 10/31/21 Right 12/12/21  Shoulder flexion 90    135 155 155 (sitting)  Shoulder extension         Shoulder abduction 90    126 135 135 (sitting)  Shoulder adduction         Shoulder internal rotation     80 WFL FIR to T12  Shoulder external rotation     59 WFL 65 (sitting)  Elbow flexion         Elbow extension         Wrist flexion         Wrist extension         Wrist ulnar deviation         Wrist radial deviation         Wrist pronation         Wrist supination         (Blank rows = not tested)   UPPER EXTREMITY MMT:    MMT Right 11/20/2021 Right 12/06/21  Shoulder flexion  4 4   Shoulder extension      Shoulder abduction  4 4   Shoulder adduction      Shoulder internal rotation 4  4   Shoulder external rotation 4  4   Middle trapezius      Lower trapezius      Elbow flexion      Elbow extension      Wrist flexion      Wrist extension      Wrist ulnar deviation      Wrist radial deviation      Wrist pronation      Wrist supination      Grip strength (lbs)      (Blank rows = not tested)               TODAY'S TREATMENT: 12/12/21 MMT and ROM measurements - see objective above AA sitting ER with 1# bar 10 x 5 sec hold Rt shoulder flexion 3# 3x10 Rt shoulder abduction 3# 3x10 Rt shoulder overhead press 3# 3x10 Rt shoulder bicep curl 5# X 10 pronated, X 10 supinated, X 10 hammer curl  12/06/21 Therex:     Sitting: Pulleys x 2 min; flexion and abduction each Bilat shoulder flexion 3# 2X10 Bilat shoulder abduction 3#2X10 Rt  shoulder bicep curl 5# X 10  pronated, X 10 supinated, X 10 hammer curl Bilat shoulder OH press 3# 2X10 Bilat shoulder Horizontal abduction green 2X10 Bilat shoulder ER green 2X10 Row machine bilat 25# 2X15 Lat pull machine bilat 20# 2X10         Standing: ER and IR at 90/90 flexion/abd with L3 band 2 x 15 reps (to help with throwing motion) Wall push ups 2X15 Doorway stretch mid 10 sec X 10 reps   11/20/21 Therex:     Sitting: Pulleys x 2 min; flexion and scaption each Bilat shoulder flexion 2# 2X10 Bilat shoulder abduction 2#2X10 Rt shoulder bicep curl 5# 3X10 Bilat tricep extension with L4 band around neck 2X15 OH ball stability rolls 2# X 15 up/down, lateral, circles CW and CCW     Aerobic: UBE L3 x 6 min (3 min each direction)      Standing: Rows L4 2x 15 Shoulder extensions L4 2X10 IR with L3 band 2x 15 reps ER with L3 band 2 x 15 reps Wall push ups 2X10    PATIENT EDUCATION: Education details: HEP Person educated: Patient Education method: Consulting civil engineer, Media planner, and Handouts Education comprehension: verbalized understanding, returned demonstration, and needs further education     HOME EXERCISE PROGRAM: Access Code: [email or web address] URL: [email or web address] Date: 12/12/2021 Prepared by: Faustino Congress  Exercises - Standing Row with Anchored Resistance  - 1-2 x daily - 7 x weekly - 1-2 sets - 10 reps - 5 se hold - Shoulder Internal Rotation with Resistance  - 1-2 x daily - 7 x weekly - 1-2 sets - 10 reps - Shoulder External Rotation with Anchored Resistance  - 1-2 x daily - 7 x weekly - 1-2 sets - 10 reps - Seated Shoulder External Rotation AAROM with Cane and Hand in Neutral  - 1-2 x daily - 7 x weekly - 1-2 sets - 10 reps - 5 sec hold - Standing Shoulder External Rotation Stretch in Doorway  - 1-2 x daily - 7 x weekly - 1 sets - 3 reps - 15-20 sec hold - Seated Single Arm Shoulder Flexion with Dumbbells  - 1 x daily - 7 x weekly - 3 sets - 10 reps - Seated Single Arm  Shoulder Abduction with Dumbbell - Thumb Up  - 1 x daily - 7 x weekly - 3 sets - 10 reps - Seated Single Arm Shoulder Press with Dumbbell  - 1 x daily - 7 x weekly - 3 sets - 10 reps - Seated Single Arm Bicep Curls Supinated with Dumbbell  - 1 x daily - 7 x weekly - 3 sets - 10 reps   ASSESSMENT:   CLINICAL IMPRESSION: Pt has met and partially met 3 LTGs, and has demonstrated progress towards all other goals.  New goals added to progress met goals.  Recommend continued PT 1x/wk for up to 4 weeks to maximize ER ROM as well as strengthening.     OBJECTIVE IMPAIRMENTS decreased ROM, decreased strength, hypomobility, increased fascial restrictions, increased muscle spasms, impaired UE functional use, and pain.    ACTIVITY LIMITATIONS cleaning, community activity, driving, meal prep, and laundry.    PERSONAL FACTORS 3+ comorbidities: OA, HTN, hx back surgery Nov 2021  are also affecting patient's functional outcome.      REHAB POTENTIAL: Good   CLINICAL DECISION MAKING: Evolving/moderate complexity   EVALUATION COMPLEXITY: Moderate     GOALS: Goals reviewed with patient? Yes   SHORT TERM GOALS: Target date: 11/09/2021   Independent  with initial HEP Goal status: MET, relays compliance 11/20/21   2.  Rt shoulder active flexion and abduction to 90 deg for improved function Goal status: MET, see above measurements, now >135 deg since 11/01/21      LONG TERM GOALS: Target date: 01/09/22   Independent with final HEP Goal status: ongoing 12/12/21   2.  FOTO score improved to 68 Goal status: ongoing 12/12/21   3.  Rt shoulder AROM improved to [facility] for improved mobility and function Goal status: partially met, 12/12/21   4.  Report pain < 3/10 with activity for improved function Goal status: met 12/12/21   5.  Rt shoulder strength at least 4/5 for improved function Goal status: met 12/12/21  6. Improve Rt shoulder ER to at least 80 deg for improved function  Goal status: INITIAL  7.  Improve Rt shoulder strength to at least 4+/5 for improved function  Goal status: INITIAL       PLAN: PT FREQUENCY: 1x/week   PT DURATION: 4 weeks   PLANNED INTERVENTIONS: Therapeutic exercises, Therapeutic activity, Neuromuscular re-education, Patient/Family education, Joint mobilization, Dry Needling, Electrical stimulation, Cryotherapy, Moist heat, Taping, Vasopneumatic device, and Manual therapy   PLAN FOR NEXT SESSION: continue strengthening program, ER end range, Avoid forward bending activities due to back     Laureen Abrahams, PT, DPT 12/12/21 1:51 PM

## 2021-12-13 ENCOUNTER — Other Ambulatory Visit: Payer: Self-pay | Admitting: Internal Medicine

## 2021-12-13 MED ORDER — OXYCODONE HCL 30 MG PO TABA: 15.0000 mg | ORAL_TABLET | Freq: Two times a day (BID) | ORAL | 0 refills | Status: DC | PRN

## 2021-12-13 NOTE — Progress Notes (Signed)
Rx Replacement

## 2021-12-13 NOTE — Telephone Encounter (Signed)
Pt called in requesting Oxycodone HCl 10 MG TABS.  He had the prescription filled for '30MG'$  but would rather stay on '10MG'$ .   Pharmacy: [facility] [id number], [place] Phone:  [phone]  Fax:  [phone]

## 2021-12-14 ENCOUNTER — Telehealth: Payer: Self-pay | Admitting: Internal Medicine

## 2021-12-14 LAB — TISSUE TRANSGLUTAMINASE ABS,IGG,IGA
(tTG) Ab, IgA: <1 U/mL
(tTG) Ab, IgG: <1 U/mL

## 2021-12-14 LAB — IGA: Immunoglobulin A: 157 mg/dL (ref 47–310)

## 2021-12-14 NOTE — Telephone Encounter (Signed)
Pt called requesting a refill on Oxycodone HCl 10 MG TABS. Called and confirmed [facility] is out of stock and pt called and confirmed that oxyCODONE HCl 30 MG TABA was out of stock at [facility].   Pt is requesting refill of Oxycodone HCl 10 MG TABS to go to  [facility] [id number] - [address], [place]

## 2021-12-14 NOTE — Telephone Encounter (Signed)
Pt called back to follow up on this medication.  Informed pt. That medication refills can take upp to 3 days.

## 2021-12-15 ENCOUNTER — Other Ambulatory Visit: Payer: Self-pay | Admitting: Internal Medicine

## 2021-12-15 MED ORDER — OXYCODONE HCL 10 MG PO TABS: 10.0000 mg | ORAL_TABLET | Freq: Three times a day (TID) | ORAL | 0 refills | Status: DC

## 2021-12-15 NOTE — Telephone Encounter (Signed)
Message has been sent

## 2021-12-15 NOTE — Progress Notes (Unsigned)
Ok Thx 

## 2021-12-15 NOTE — Telephone Encounter (Signed)
Okay.  Done.  Thanks 

## 2021-12-17 ENCOUNTER — Other Ambulatory Visit: Payer: Self-pay | Admitting: Internal Medicine

## 2021-12-18 ENCOUNTER — Encounter: Payer: Self-pay | Admitting: Surgical

## 2021-12-18 ENCOUNTER — Ambulatory Visit: Payer: Commercial Managed Care - HMO | Admitting: Surgical

## 2021-12-18 DIAGNOSIS — S43431A Superior glenoid labrum lesion of right shoulder, initial encounter: Secondary | ICD-10-CM

## 2021-12-18 NOTE — Telephone Encounter (Signed)
Rec'd msg The original prescription was discontinued on 04/13/2020 by [name], MD for the following reason: Stop Taking at Discharge. Pls advise if pt suppose to take.Adam KitchenJohny Chess

## 2021-12-18 NOTE — Progress Notes (Signed)
Post-Op Visit Note   Patient: Adam Vaughn           Date of Birth: 1975-09-26           MRN: [id number] Visit Date: 12/18/2021 PCP: Cassandria Anger, MD   Assessment & Plan:  Chief Complaint:  Chief Complaint  Patient presents with   Follow-up   Visit Diagnoses:  1. 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  He is about 3 months out from procedure.  He states that he is doing well overall and feels constant progression without plateau yet.  He is in physical therapy 1 time per week.  Still notes some continued pain around the biceps tenodesis site and radiation down the bicep with some soreness.  Occasional muscle spasm but this is significantly less common than it was at his last appointment.  Mostly working on rotator cuff and bicep strengthening as well as range of motion and PT.  He has about 4 more visits.  Also notes some left shoulder pain on occasion.  Denies any episodes of instability.  On exam, patient has 45 degrees external rotation, 95 degrees abduction, 175 degrees forward flexion.  Bicep contour looks good.  Good bicep flexion, supination strength.  Excellent rotator cuff strength of supra and subscap rated 5/5 with 5 -/5 external rotation strength.  Incisions are well-healed.  Axillary nerve intact with deltoid firing.  Plan is continue with PT and transition to home exercise program in about 4 weeks.  Range of motion is definitely improving compared with last visit on 10/30/2021.  Follow-up in 2 months for likely final check with Dr. Marlou Sa.  Follow-Up Instructions: No follow-ups on file.   Orders:  No orders of the defined types were placed in this encounter.  No orders of the defined types were placed in this encounter.   Imaging: No results found.  PMFS History: Patient Active Problem List   Diagnosis Date Noted    Chronic pain syndrome 11/07/2021   Piriformis syndrome 11/07/2021   Bladder neck obstruction 09/04/2021   Asthmatic bronchitis 09/04/2021   Pain in right shoulder 06/29/2021   Meteorism 06/01/2021   Sinus congestion 02/28/2021   Vertigo 02/17/2021   Headache 02/17/2021   Fever 02/17/2021   Falls 02/17/2021   Allergic rhinitis 10/25/2020   Family history of acute polio 10/25/2020   Chest pain, atypical 10/10/2020   S/P laparoscopic cholecystectomy 10/10/2020   Hyperglycemia 10/10/2020   Acute cholecystitis 09/30/2020   Insomnia 09/26/2020   Knee pain 06/23/2020   Low vitamin B12 level 05/31/2020   Ankle sprain 05/31/2020   Constipation by delayed colonic transit 05/31/2020   Degenerative spondylolisthesis 04/11/2020   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   DDD (degenerative disc disease), lumbar 01/20/2020   Left lumbar radiculitis 01/20/2020   Vitamin D deficiency 01/15/2020   Low back pain 01/12/2020   IBS (irritable bowel syndrome) 01/12/2020   Nephrolithiasis 01/12/2020   Erectile dysfunction 01/12/2020   GERD (gastroesophageal reflux disease) 01/12/2020   Past Medical History:  Diagnosis Date   Arthritis    Chronic back pain    Family history of colon cancer    sister   GERD (gastroesophageal reflux disease)    History of kidney stones    Hypertension    Kidney stones     Family History  Problem Relation Age of Onset  Hypertension Father    Heart attack Father    Other Father        Small intestine removed but does not know why   Diabetes Sister    Heart disease Sister    Colon cancer Sister    Colon polyps Sister    Colon polyps Sister    Healthy Sister    Healthy Sister    Healthy Sister    Other Brother        MVA   Healthy Brother    Healthy Brother    Healthy Brother    Healthy Brother    Healthy Brother    Healthy Brother    Liver disease Neg Hx    Pancreatic cancer Neg Hx    Esophageal cancer Neg Hx     Stomach cancer Neg Hx     Past Surgical History:  Procedure Laterality Date   BACK SURGERY  04/11/2020   CHOLECYSTECTOMY N/A 10/01/2020   Procedure: LAPAROSCOPIC CHOLECYSTECTOMY;  Surgeon: Jovita Kussmaul, MD;  Location: [facility];  Service: General;  Laterality: N/A;   COLONOSCOPY  1991   IBS   endocolon  09/15/2021   [place], GERD   HAND SURGERY Bilateral    SHOULDER SURGERY Right 08/2021   SPINAL CORD STIMULATOR IMPLANT  2022   SPINAL CORD STIMULATOR REMOVAL  2022   TONSILLECTOMY     VASECTOMY     Social History   Occupational History   Not on file  Tobacco Use   Smoking status: Never   Smokeless tobacco: Never  Vaping Use   Vaping Use: Never used  Substance and Sexual Activity   Alcohol use: Not Currently    Comment: states uit around age 58   Drug use: Never   Sexual activity: Not Currently    Birth control/protection: Surgical    Comment: vastectomy

## 2021-12-19 ENCOUNTER — Encounter: Payer: Self-pay | Admitting: Physical Therapy

## 2021-12-19 ENCOUNTER — Ambulatory Visit: Payer: Commercial Managed Care - HMO | Admitting: Physical Therapy

## 2021-12-19 DIAGNOSIS — M25611 Stiffness of right shoulder, not elsewhere classified: Secondary | ICD-10-CM | POA: Diagnosis not present

## 2021-12-19 DIAGNOSIS — M25511 Pain in right shoulder: Secondary | ICD-10-CM

## 2021-12-19 DIAGNOSIS — M6281 Muscle weakness (generalized): Secondary | ICD-10-CM | POA: Diagnosis not present

## 2021-12-19 DIAGNOSIS — R293 Abnormal posture: Secondary | ICD-10-CM | POA: Diagnosis not present

## 2021-12-19 DIAGNOSIS — R600 Localized edema: Secondary | ICD-10-CM

## 2021-12-19 NOTE — Therapy (Signed)
OUTPATIENT PHYSICAL THERAPY TREATMENT NOTE   Patient Name: Adam Vaughn MRN: [id number] DOB:[birth year], 46 y.o., male Today's Date: 12/19/2021  PCP: Cassandria Anger, MD REFERRING PROVIDER: Meredith Pel, MD  END OF SESSION:   PT End of Session - 12/19/21 1307     Visit Number 7    Number of Visits 16    Date for PT Re-Evaluation 01/09/22    Authorization Type [employer]    PT Start Time 1259    PT Stop Time 1340    PT Time Calculation (min) 41 min    Activity Tolerance Patient tolerated treatment well    Behavior During Therapy WFL for tasks assessed/performed               Past Medical History:  Diagnosis Date   Arthritis    Chronic back pain    Family history of colon cancer    sister   GERD (gastroesophageal reflux disease)    History of kidney stones    Hypertension    Kidney stones    Past Surgical History:  Procedure Laterality Date   BACK SURGERY  04/11/2020   CHOLECYSTECTOMY N/A 10/01/2020   Procedure: LAPAROSCOPIC CHOLECYSTECTOMY;  Surgeon: Autumn Messing III, MD;  Location: [facility];  Service: General;  Laterality: N/A;   COLONOSCOPY  1991   IBS   endocolon  09/15/2021   [place], GERD   HAND SURGERY Bilateral    SHOULDER SURGERY Right 08/2021   SPINAL CORD STIMULATOR IMPLANT  2022   SPINAL CORD STIMULATOR REMOVAL  2022   TONSILLECTOMY     VASECTOMY     Patient Active Problem List   Diagnosis Date Noted   Chronic pain syndrome 11/07/2021   Piriformis syndrome 11/07/2021   Bladder neck obstruction 09/04/2021   Asthmatic bronchitis 09/04/2021   Pain in right shoulder 06/29/2021   Meteorism 06/01/2021   Sinus congestion 02/28/2021   Vertigo 02/17/2021   Headache 02/17/2021   Fever 02/17/2021   Falls 02/17/2021   Allergic rhinitis 10/25/2020   Family history of acute polio 10/25/2020   Chest pain, atypical 10/10/2020   S/P laparoscopic cholecystectomy 10/10/2020   Hyperglycemia 10/10/2020   Acute cholecystitis 09/30/2020   Insomnia  09/26/2020   Knee pain 06/23/2020   Low vitamin B12 level 05/31/2020   Ankle sprain 05/31/2020   Constipation by delayed colonic transit 05/31/2020   Degenerative spondylolisthesis 04/11/2020   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   DDD (degenerative disc disease), lumbar 01/20/2020   Left lumbar radiculitis 01/20/2020   Vitamin D deficiency 01/15/2020   Low back pain 01/12/2020   IBS (irritable bowel syndrome) 01/12/2020   Nephrolithiasis 01/12/2020   Erectile dysfunction 01/12/2020   GERD (gastroesophageal reflux disease) 01/12/2020    REFERRING DIAG: M25.511 (ICD-10-CM) - Right shoulder pain, unspecified chronicity   THERAPY DIAG:  Acute pain of right shoulder  Stiffness of right shoulder, not elsewhere classified  Muscle weakness (generalized)  Abnormal posture  Localized edema  PERTINENT HISTORY: OA, HTN, back surgery 2021  PRECAUTIONS: Fall; Shoulder Now okay for progressing ROM and light strengthening activities  Onset date: s/p Rt shoulder biceps tenodesis, labral repair and debridement on 09/11/21.  SUBJECTIVE: had some sharp pain on lateral shoulder after stretching in doorframe; and some pain with mixing pancake batter   PAIN:  Are you having pain? Yes: NPRS scale: 0/10 Pain location: Rt shoulder Pain description: sore aching Aggravating factors: when he moves it different ways Relieving factors: rest   OBJECTIVE: (  objective measures completed at initial evaluation unless otherwise dated)   OBJECTIVE:    PATIENT SURVEYS:  10/12/21: FOTO 48 (predicted 68) 12/12/21: FOTO 66                                  POSTURE: Rounded shoulders   UPPER EXTREMITY ROM:    Passive ROM Right 10/12/2021  Shoulder flexion 90  Shoulder extension    Shoulder abduction 90  Shoulder adduction    Shoulder internal rotation 60 (in 55 deg abdct)  Shoulder external rotation 40 (In 55 deg abdct)  (Blank rows = not tested)   Active  ROM Right 10/12/2021 Left 10/12/2021 Right 11/01/21 Right 10/31/21 Right 12/12/21  Shoulder flexion 90    135 155 155 (sitting)  Shoulder extension         Shoulder abduction 90    126 135 135 (sitting)  Shoulder adduction         Shoulder internal rotation     80 WFL FIR to T12  Shoulder external rotation     59 WFL 65 (sitting)  (Blank rows = not tested)   UPPER EXTREMITY MMT:    MMT Right 11/20/2021 Right 12/06/21  Shoulder flexion  4 4   Shoulder extension      Shoulder abduction  4 4   Shoulder adduction      Shoulder internal rotation 4  4   Shoulder external rotation 4  4   (Blank rows = not tested)               TODAY'S TREATMENT: 12/19/21 UBE L4 x 8 min (alt 2' each direction) Sustained holds: alternating flexion and abduction 2x30 sec, 3#, then 2# due to increased pain Rt shoulder bicep curl 5# X 10 pronated, X 10 supinated, X 10 hammer curl ER reactive isometrics 5# 2x10; IR reactive isometrics 15# x 10 (stopped due to back pain) Rt row on cable 15# 3x10 Lat pull downs on cable 20# 2x10 Lat pull down with narrow grip and hands supinated x20 reps; 15# Plank position at counter with shoulder taps 2x10   12/12/21 MMT and ROM measurements - see objective above AA sitting ER with 1# bar 10 x 5 sec hold Rt shoulder flexion 3# 3x10 Rt shoulder abduction 3# 3x10 Rt shoulder overhead press 3# 3x10 Rt shoulder bicep curl 5# X 10 pronated, X 10 supinated, X 10 hammer curl  12/06/21 Therex:     Sitting: Pulleys x 2 min; flexion and abduction each Bilat shoulder flexion 3# 2X10 Bilat shoulder abduction 3#2X10 Rt shoulder bicep curl 5# X 10 pronated, X 10 supinated, X 10 hammer curl Bilat shoulder OH press 3# 2X10 Bilat shoulder Horizontal abduction green 2X10 Bilat shoulder ER green 2X10 Row machine bilat 25# 2X15 Lat pull machine bilat 20# 2X10         Standing: ER and IR at 90/90 flexion/abd with L3 band 2 x 15 reps (to help with throwing motion) Wall push ups  2X15 Doorway stretch mid 10 sec X 10 reps    PATIENT EDUCATION: Education details: HEP Person educated: Patient Education method: Consulting civil engineer, Media planner, and Handouts Education comprehension: verbalized understanding, returned demonstration, and needs further education     HOME EXERCISE PROGRAM: Access Code: [email or web address] URL: [email or web address] Date: 12/12/2021 Prepared by: Faustino Congress  Exercises - Standing Row with Anchored Resistance  - 1-2 x daily - 7 x weekly - 1-2 sets - 10  reps - 5 se hold - Shoulder Internal Rotation with Resistance  - 1-2 x daily - 7 x weekly - 1-2 sets - 10 reps - Shoulder External Rotation with Anchored Resistance  - 1-2 x daily - 7 x weekly - 1-2 sets - 10 reps - Seated Shoulder External Rotation AAROM with Cane and Hand in Neutral  - 1-2 x daily - 7 x weekly - 1-2 sets - 10 reps - 5 sec hold - Standing Shoulder External Rotation Stretch in Doorway  - 1-2 x daily - 7 x weekly - 1 sets - 3 reps - 15-20 sec hold - Seated Single Arm Shoulder Flexion with Dumbbells  - 1 x daily - 7 x weekly - 3 sets - 10 reps - Seated Single Arm Shoulder Abduction with Dumbbell - Thumb Up  - 1 x daily - 7 x weekly - 3 sets - 10 reps - Seated Single Arm Shoulder Press with Dumbbell  - 1 x daily - 7 x weekly - 3 sets - 10 reps - Seated Single Arm Bicep Curls Supinated with Dumbbell  - 1 x daily - 7 x weekly - 3 sets - 10 reps   ASSESSMENT:   CLINICAL IMPRESSION: Pt tolerated strengthening exercise progressions today well.  Did discuss modifications including decreased range or decreased weight if he starts to experience pain.  Overall progressing well and will continue to benefit from PT to maximize function.     OBJECTIVE IMPAIRMENTS decreased ROM, decreased strength, hypomobility, increased fascial restrictions, increased muscle spasms, impaired UE functional use, and pain.    ACTIVITY LIMITATIONS cleaning, community activity, driving, meal prep, and  laundry.    PERSONAL FACTORS 3+ comorbidities: OA, HTN, hx back surgery Nov 2021  are also affecting patient's functional outcome.      REHAB POTENTIAL: Good   CLINICAL DECISION MAKING: Evolving/moderate complexity   EVALUATION COMPLEXITY: Moderate     GOALS: Goals reviewed with patient? Yes   SHORT TERM GOALS: Target date: 11/09/2021   Independent with initial HEP Goal status: MET, relays compliance 11/20/21   2.  Rt shoulder active flexion and abduction to 90 deg for improved function Goal status: MET, see above measurements, now >135 deg since 11/01/21      LONG TERM GOALS: Target date: 01/09/22   Independent with final HEP Goal status: ongoing 12/12/21   2.  FOTO score improved to 68 Goal status: ongoing 12/12/21   3.  Rt shoulder AROM improved to [facility] for improved mobility and function Goal status: partially met, 12/12/21   4.  Report pain < 3/10 with activity for improved function Goal status: met 12/12/21   5.  Rt shoulder strength at least 4/5 for improved function Goal status: met 12/12/21  6. Improve Rt shoulder ER to at least 80 deg for improved function  Goal status: INITIAL  7. Improve Rt shoulder strength to at least 4+/5 for improved function  Goal status: INITIAL       PLAN: PT FREQUENCY: 1x/week   PT DURATION: 4 weeks   PLANNED INTERVENTIONS: Therapeutic exercises, Therapeutic activity, Neuromuscular re-education, Patient/Family education, Joint mobilization, Dry Needling, Electrical stimulation, Cryotherapy, Moist heat, Taping, Vasopneumatic device, and Manual therapy   PLAN FOR NEXT SESSION: continue strengthening program and muscle endurance, ER end range, Avoid forward bending activities due to back     Laureen Abrahams, PT, DPT 12/19/21 1:45 PM

## 2021-12-22 ENCOUNTER — Other Ambulatory Visit: Payer: Self-pay | Admitting: Internal Medicine

## 2021-12-25 ENCOUNTER — Other Ambulatory Visit (HOSPITAL_COMMUNITY): Payer: Self-pay

## 2021-12-25 MED ORDER — ONDANSETRON HCL 4 MG PO TABS
4.0000 mg | ORAL_TABLET | Freq: Three times a day (TID) | ORAL | 0 refills | Status: DC | PRN
  Filled 2021-12-25: qty 20, 7d supply, fill #0

## 2021-12-26 ENCOUNTER — Encounter: Payer: Self-pay | Admitting: Physical Therapy

## 2021-12-26 ENCOUNTER — Ambulatory Visit: Payer: Commercial Managed Care - HMO | Admitting: Physical Therapy

## 2021-12-26 DIAGNOSIS — R293 Abnormal posture: Secondary | ICD-10-CM

## 2021-12-26 DIAGNOSIS — R600 Localized edema: Secondary | ICD-10-CM

## 2021-12-26 DIAGNOSIS — M25611 Stiffness of right shoulder, not elsewhere classified: Secondary | ICD-10-CM

## 2021-12-26 DIAGNOSIS — M6281 Muscle weakness (generalized): Secondary | ICD-10-CM | POA: Diagnosis not present

## 2021-12-26 DIAGNOSIS — M25511 Pain in right shoulder: Secondary | ICD-10-CM | POA: Diagnosis not present

## 2022-01-03 ENCOUNTER — Encounter: Payer: Commercial Managed Care - HMO | Admitting: Physical Therapy

## 2022-01-09 ENCOUNTER — Encounter: Payer: Self-pay | Admitting: Physical Therapy

## 2022-01-09 ENCOUNTER — Ambulatory Visit: Payer: Commercial Managed Care - HMO | Admitting: Physical Therapy

## 2022-01-09 DIAGNOSIS — M25511 Pain in right shoulder: Secondary | ICD-10-CM | POA: Diagnosis not present

## 2022-01-09 DIAGNOSIS — M6281 Muscle weakness (generalized): Secondary | ICD-10-CM

## 2022-01-09 DIAGNOSIS — R600 Localized edema: Secondary | ICD-10-CM

## 2022-01-09 DIAGNOSIS — M25611 Stiffness of right shoulder, not elsewhere classified: Secondary | ICD-10-CM

## 2022-01-09 DIAGNOSIS — R293 Abnormal posture: Secondary | ICD-10-CM

## 2022-01-09 NOTE — Therapy (Signed)
OUTPATIENT PHYSICAL THERAPY TREATMENT NOTE RECERTIFICATION   Patient Name: Adam Vaughn MRN: [id number] DOB:04-20-1976, 46 y.o., male Today's Date: 01/09/2022  PCP: Cassandria Anger, MD REFERRING PROVIDER: Meredith Pel, MD  END OF SESSION:   PT End of Session - 01/09/22 1250     Visit Number 9    Number of Visits 16    Date for PT Re-Evaluation 02/20/22    Authorization Type [employer]    PT Start Time 1300    PT Stop Time 1340    PT Time Calculation (min) 40 min    Activity Tolerance Patient tolerated treatment well    Behavior During Therapy WFL for tasks assessed/performed                 Past Medical History:  Diagnosis Date   Arthritis    Chronic back pain    Family history of colon cancer    sister   GERD (gastroesophageal reflux disease)    History of kidney stones    Hypertension    Kidney stones    Past Surgical History:  Procedure Laterality Date   BACK SURGERY  04/11/2020   CHOLECYSTECTOMY N/A 10/01/2020   Procedure: LAPAROSCOPIC CHOLECYSTECTOMY;  Surgeon: Jovita Kussmaul, MD;  Location: [facility];  Service: General;  Laterality: N/A;   COLONOSCOPY  1991   IBS   endocolon  09/15/2021   [place], GERD   HAND SURGERY Bilateral    SHOULDER SURGERY Right 08/2021   SPINAL CORD STIMULATOR IMPLANT  2022   SPINAL CORD STIMULATOR REMOVAL  2022   TONSILLECTOMY     VASECTOMY     Patient Active Problem List   Diagnosis Date Noted   Chronic pain syndrome 11/07/2021   Piriformis syndrome 11/07/2021   Bladder neck obstruction 09/04/2021   Asthmatic bronchitis 09/04/2021   Pain in right shoulder 06/29/2021   Meteorism 06/01/2021   Sinus congestion 02/28/2021   Vertigo 02/17/2021   Headache 02/17/2021   Fever 02/17/2021   Falls 02/17/2021   Allergic rhinitis 10/25/2020   Family history of acute polio 10/25/2020   Chest pain, atypical 10/10/2020   S/P laparoscopic cholecystectomy 10/10/2020   Hyperglycemia 10/10/2020   Acute cholecystitis  09/30/2020   Insomnia 09/26/2020   Knee pain 06/23/2020   Low vitamin B12 level 05/31/2020   Ankle sprain 05/31/2020   Constipation by delayed colonic transit 05/31/2020   Degenerative spondylolisthesis 04/11/2020   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   DDD (degenerative disc disease), lumbar 01/20/2020   Left lumbar radiculitis 01/20/2020   Vitamin D deficiency 01/15/2020   Low back pain 01/12/2020   IBS (irritable bowel syndrome) 01/12/2020   Nephrolithiasis 01/12/2020   Erectile dysfunction 01/12/2020   GERD (gastroesophageal reflux disease) 01/12/2020    REFERRING DIAG: M25.511 (ICD-10-CM) - Right shoulder pain, unspecified chronicity   THERAPY DIAG:  Acute pain of right shoulder - Plan: PT plan of care cert/re-cert  Stiffness of right shoulder, not elsewhere classified - Plan: PT plan of care cert/re-cert  Muscle weakness (generalized) - Plan: PT plan of care cert/re-cert  Abnormal posture - Plan: PT plan of care cert/re-cert  Localized edema - Plan: PT plan of care cert/re-cert  PERTINENT HISTORY: OA, HTN, back surgery 2021  PRECAUTIONS: Fall; Shoulder Now okay for progressing ROM and light strengthening activities  Onset date: s/p Rt shoulder biceps tenodesis, labral repair and debridement on 09/11/21.  SUBJECTIVE: having a lot of aching in shoulder and spasms   PAIN:  Are  you having pain? Yes: NPRS scale: 2/10 Pain location: Rt shoulder Pain description: sore aching Aggravating factors: when he moves it different ways Relieving factors: rest   OBJECTIVE: (objective measures completed at initial evaluation unless otherwise dated)   PATIENT SURVEYS:  10/12/21: FOTO 48 (predicted 68) 12/12/21: FOTO 66 01/09/22: FOTO 61                                   UPPER EXTREMITY ROM:    Passive ROM Right 10/12/2021  Shoulder flexion 90  Shoulder extension    Shoulder abduction 90  Shoulder adduction    Shoulder internal rotation  60 (in 55 deg abdct)  Shoulder external rotation 40 (In 55 deg abdct)  (Blank rows = not tested)   Active ROM Right 10/12/2021 Left 10/12/2021 Right 11/01/21 Right 10/31/21 Right 12/12/21 Right 01/09/22  Shoulder flexion 90    135 155 155 (sitting) 155 (sitting)  Shoulder extension          Shoulder abduction 90    126 135 135 (sitting) 134 (sitting)  Shoulder adduction          Shoulder internal rotation     80 WFL FIR to T12 FIR to T12  Shoulder external rotation     59 WFL 65 (sitting) 71 (sitting)  (Blank rows = not tested)   UPPER EXTREMITY MMT:    MMT Right 11/20/2021 Right 12/06/21 Right 01/09/22  Shoulder flexion  $Remove'4 4  4  'JHwUfxQ$ Shoulder extension       Shoulder abduction  4 4  3+/5 (with pain)  Shoulder adduction       Shoulder internal rotation $RemoveBeforeDEI'4  4  4 '[id number]$ (with pain)  Shoulder external rotation $RemoveBeforeDEI'4  4  5  '[id number]$ (Blank rows = not tested)               TODAY'S TREATMENT: 01/09/22 MMT and ROM assessments as noted above UBE L4 x 8 min (4' each direction) Mobilization with movement: IASTM percussive device to Rt bicep with bicep curls 2x10 to see if this helps to decrease spasms - continued bicep twitching after concentric/eccentric movement Bicep curls 2x10 with 2# weight, focus on full range and slow eccentrics which seemed to decrease muscle twitching  12/26/21 UBE L4 x 8 min (4' each direction) Rt row on cable 15# 3x10 Lat pull downs on cable 20# 3x10 Lat pull down with narrow grip and hands supinated 3x10 reps; 20# Rt shoulder bicep curl 5# , 3x10 supinated, 3x10 hammer curl ER stretch in sitting with 1# bar 3x1 min hold Sidelying ER ball toss 3x30 sec Sidelying horizontal abduction 3# on Rt 3x10 Supine Rt shoulder protraction 5# 3x10 Supine Rt shoulder circles CW/CCW in 90 deg flexion x 20 each, 5# Sitting Rt overhead press 5# 3x10 Plank position at counter with shoulder taps 2x10   12/19/21 UBE L4 x 8 min (alt 2' each direction) Sustained holds: alternating flexion and abduction  2x30 sec, 3#, then 2# due to increased pain Rt shoulder bicep curl 5# X 10 pronated, X 10 supinated, X 10 hammer curl ER reactive isometrics 5# 2x10; IR reactive isometrics 15# x 10 (stopped due to back pain) Rt row on cable 15# 3x10 Lat pull downs on cable 20# 2x10 Lat pull down with narrow grip and hands supinated x20 reps; 15# Plank position at counter with shoulder taps 2x10   PATIENT EDUCATION: Education details: HEP Person educated: Patient Education  method: Explanation, Demonstration, and Handouts Education comprehension: verbalized understanding, returned demonstration, and needs further education     HOME EXERCISE PROGRAM: Access Code: [email or web address] URL: [email or web address] Date: 12/12/2021 Prepared by: Faustino Congress  Exercises - Standing Row with Anchored Resistance  - 1-2 x daily - 7 x weekly - 1-2 sets - 10 reps - 5 se hold - Shoulder Internal Rotation with Resistance  - 1-2 x daily - 7 x weekly - 1-2 sets - 10 reps - Shoulder External Rotation with Anchored Resistance  - 1-2 x daily - 7 x weekly - 1-2 sets - 10 reps - Seated Shoulder External Rotation AAROM with Cane and Hand in Neutral  - 1-2 x daily - 7 x weekly - 1-2 sets - 10 reps - 5 sec hold - Standing Shoulder External Rotation Stretch in Doorway  - 1-2 x daily - 7 x weekly - 1 sets - 3 reps - 15-20 sec hold - Seated Single Arm Shoulder Flexion with Dumbbells  - 1 x daily - 7 x weekly - 3 sets - 10 reps - Seated Single Arm Shoulder Abduction with Dumbbell - Thumb Up  - 1 x daily - 7 x weekly - 3 sets - 10 reps - Seated Single Arm Shoulder Press with Dumbbell  - 1 x daily - 7 x weekly - 3 sets - 10 reps - Seated Single Arm Bicep Curls Supinated with Dumbbell  - 1 x daily - 7 x weekly - 3 sets - 10 reps   ASSESSMENT:   CLINICAL IMPRESSION: Pt with recent set back due to increase in spasms in bicep and increase in pain.  FOTO score slightly decreased, and strength slightly decreased.  He's still  doing well at this time but limited by spasms.  Plan to see biweekly up to 6 weeks and if pain/spasms persist would recommend following up with MD sooner. Continue all unmet LTGs.   OBJECTIVE IMPAIRMENTS decreased ROM, decreased strength, hypomobility, increased fascial restrictions, increased muscle spasms, impaired UE functional use, and pain.    ACTIVITY LIMITATIONS cleaning, community activity, driving, meal prep, and laundry.    PERSONAL FACTORS 3+ comorbidities: OA, HTN, hx back surgery Nov 2021  are also affecting patient's functional outcome.      REHAB POTENTIAL: Good   CLINICAL DECISION MAKING: Evolving/moderate complexity   EVALUATION COMPLEXITY: Moderate     GOALS: Goals reviewed with patient? Yes   SHORT TERM GOALS: Target date: 11/09/2021   Independent with initial HEP Goal status: MET, relays compliance 11/20/21   2.  Rt shoulder active flexion and abduction to 90 deg for improved function Goal status: MET, see above measurements, now >135 deg since 11/01/21      LONG TERM GOALS: Target date: 02/20/22   Independent with final HEP Goal status: Ongoing 01/09/22   2.  FOTO score improved to 68 Goal status: Ongoing 01/09/22   3.  Rt shoulder AROM improved to [facility] for improved mobility and function Goal status: partially met, 12/12/21   4.  Report pain < 3/10 with activity for improved function Goal status: met 12/12/21   5.  Rt shoulder strength at least 4/5 for improved function Goal status: met 12/12/21  6. Improve Rt shoulder ER to at least 80 deg for improved function Goal status: ongoing 01/09/22  7. Improve Rt shoulder strength to at least 4+/5 for improved function Goal status: ongoing 01/09/22       PLAN: PT FREQUENCY: 1x every other week, will see up to 1x/wk PRN  PT DURATION: 6 weeks   PLANNED INTERVENTIONS: Therapeutic exercises, Therapeutic activity, Neuromuscular re-education, Patient/Family education, Joint mobilization, Dry Needling, Electrical  stimulation, Cryotherapy, Moist heat, Taping, Vasopneumatic device, and Manual therapy   PLAN FOR NEXT SESSION: see how spasms are going, continue as indicated    Laureen Abrahams, PT, DPT 01/09/22 1:56 PM

## 2022-01-10 ENCOUNTER — Other Ambulatory Visit: Payer: Self-pay

## 2022-01-10 MED ORDER — DICYCLOMINE HCL 10 MG PO CAPS: ORAL_CAPSULE | ORAL | 1 refills | Status: DC

## 2022-01-11 ENCOUNTER — Encounter: Payer: Self-pay | Admitting: Internal Medicine

## 2022-01-11 ENCOUNTER — Ambulatory Visit (INDEPENDENT_AMBULATORY_CARE_PROVIDER_SITE_OTHER): Payer: Commercial Managed Care - HMO | Admitting: Internal Medicine

## 2022-01-11 VITALS — BP 116/78 | HR 93 | Temp 98.3°F | Ht 68.0 in | Wt 196.8 lb

## 2022-01-11 DIAGNOSIS — E785 Hyperlipidemia, unspecified: Secondary | ICD-10-CM

## 2022-01-11 DIAGNOSIS — G8929 Other chronic pain: Secondary | ICD-10-CM

## 2022-01-11 DIAGNOSIS — M545 Low back pain, unspecified: Secondary | ICD-10-CM

## 2022-01-11 DIAGNOSIS — E538 Deficiency of other specified B group vitamins: Secondary | ICD-10-CM

## 2022-01-11 DIAGNOSIS — K219 Gastro-esophageal reflux disease without esophagitis: Secondary | ICD-10-CM | POA: Diagnosis not present

## 2022-01-11 MED ORDER — OXYCODONE HCL 10 MG PO TABS: 10.0000 mg | ORAL_TABLET | Freq: Three times a day (TID) | ORAL | 0 refills | Status: DC | PRN

## 2022-01-11 MED ORDER — OXYCODONE HCL 10 MG PO TABS: 10.0000 mg | ORAL_TABLET | Freq: Three times a day (TID) | ORAL | 0 refills | Status: DC

## 2022-01-11 MED ORDER — RABEPRAZOLE SODIUM 20 MG PO TBEC: 20.0000 mg | DELAYED_RELEASE_TABLET | Freq: Every morning | ORAL | 3 refills | Status: DC

## 2022-01-11 MED ORDER — FAMOTIDINE 40 MG PO TABS: 40.0000 mg | ORAL_TABLET | Freq: Every day | ORAL | 3 refills | Status: DC

## 2022-01-11 NOTE — Assessment & Plan Note (Addendum)
On B comlex

## 2022-01-11 NOTE — Assessment & Plan Note (Addendum)
Worse Add Protonix  Ordered cardiac CT scan for calcium scoring

## 2022-01-11 NOTE — Assessment & Plan Note (Signed)
Cont on oxycodone 10 mg 3 times daily.   Potential benefits of a long term opioids use as well as potential risks (i.e. addiction risk, apnea etc) and complications (i.e. Somnolence, constipation and others) were explained to the patient and were aknowledged. 

## 2022-01-11 NOTE — Assessment & Plan Note (Signed)
Ordered cardiac CT scan for calcium scoring

## 2022-01-11 NOTE — Progress Notes (Signed)
Subjective:  Patient ID: Adam Vaughn, male    DOB: 09/14/1975  Age: 46 y.o. MRN: [id number]  CC: Follow-up (2 month f/u/Patient c/o having "random" pains in RUQ, LLQ, and sternum)   HPI LARS JEZIORSKI presents for LBP, s/p shoulder surgery in March C/o GERD - worse  Outpatient Medications Prior to Visit  Medication Sig Dispense Refill   albuterol (PROAIR HFA) 108 (90 Base) MCG/ACT inhaler Inhale 2 puffs into the lungs every 6 (six) hours as needed for wheezing or shortness of breath. 3 each 1   dicyclomine (BENTYL) 10 MG capsule Take one tablet one to two times daily as needed for abdominal cramping 50 capsule 1   fluticasone (FLONASE) 50 MCG/ACT nasal spray Place 2 sprays into both nostrils daily. 16 g 6   loratadine (CLARITIN) 10 MG tablet Take 10 mg by mouth daily as needed for allergies.     methylcellulose (CITRUCEL) oral powder Take 1 packet by mouth daily.     naproxen (NAPROSYN) 500 MG tablet Take 1 tablet (500 mg total) by mouth 2 (two) times daily as needed for moderate pain. 180 tablet 0   ondansetron (ZOFRAN) 4 MG tablet Take 1 tablet by mouth every 8 hours as needed for nausea or vomiting. 20 tablet 0   oxymetazoline (AFRIN) 0.05 % nasal spray Place 1 spray into both nostrils daily.     pregabalin (LYRICA) 150 MG capsule Take 1 capsule (150 mg total) by mouth 2 (two) times daily. 60 capsule 5   tiZANidine (ZANAFLEX) 4 MG capsule Take 1 capsule (4 mg total) by mouth 3 (three) times daily. 180 capsule 0   tiZANidine (ZANAFLEX) 4 MG tablet TAKE 1 TABLET BY MOUTH EVERY 6 HOURS AS NEEDED FOR MUSCLE SPASM 60 tablet 0   esomeprazole (NEXIUM) 40 MG capsule Take 1 capsule (40 mg total) by mouth 2 (two) times daily before a meal. 180 capsule 3   esomeprazole (NEXIUM) 40 MG capsule Take 1 capsule (40 mg total) by mouth daily. 90 capsule 3   Oxycodone HCl 10 MG TABS Take 1 tablet (10 mg total) by mouth 3 (three) times daily as needed. 90 tablet 0   Oxycodone HCl 10 MG TABS  Take 1 tablet (10 mg total) by mouth 3 (three) times daily. Please fill on or after 11/10/21 90 tablet 0   No facility-administered medications prior to visit.    ROS: Review of Systems  Constitutional:  Negative for appetite change, fatigue and unexpected weight change.  HENT:  Negative for congestion, nosebleeds, sneezing, sore throat and trouble swallowing.   Eyes:  Negative for itching and visual disturbance.  Respiratory:  Negative for cough.   Cardiovascular:  Negative for chest pain, palpitations and leg swelling.  Gastrointestinal:  Negative for abdominal distention, blood in stool, diarrhea and nausea.  Genitourinary:  Negative for frequency and hematuria.  Musculoskeletal:  Positive for arthralgias, back pain and gait problem. Negative for joint swelling and neck pain.  Skin:  Negative for rash.  Neurological:  Negative for dizziness, tremors, speech difficulty and weakness.  Psychiatric/Behavioral:  Negative for agitation, dysphoric mood and sleep disturbance. The patient is not nervous/anxious.     Objective:  BP 116/78 (BP Location: Left Arm, Patient Position: Sitting, Cuff Size: Large)   Pulse 93   Temp 98.3 F (36.8 C) (Oral)   Ht '5\' 8"'$  (1.727 m)   Wt 196 lb 12.8 oz (89.3 kg)   SpO2 99%   BMI 29.92 kg/m   BP Readings  from Last 3 Encounters:  01/11/22 116/78  11/21/21 (!) 142/88  11/07/21 122/90    Wt Readings from Last 3 Encounters:  01/11/22 196 lb 12.8 oz (89.3 kg)  11/21/21 197 lb 12.8 oz (89.7 kg)  11/07/21 198 lb (89.8 kg)    Physical Exam Constitutional:      General: He is not in acute distress.    Appearance: He is well-developed.     Comments: NAD  Eyes:     Conjunctiva/sclera: Conjunctivae normal.     Pupils: Pupils are equal, round, and reactive to light.  Neck:     Thyroid: No thyromegaly.     Vascular: No JVD.  Cardiovascular:     Rate and Rhythm: Normal rate and regular rhythm.     Heart sounds: Normal heart sounds. No murmur  heard.    No friction rub. No gallop.  Pulmonary:     Effort: Pulmonary effort is normal. No respiratory distress.     Breath sounds: Normal breath sounds. No wheezing or rales.  Chest:     Chest wall: No tenderness.  Abdominal:     General: Bowel sounds are normal. There is no distension.     Palpations: Abdomen is soft. There is no mass.     Tenderness: There is no abdominal tenderness. There is no guarding or rebound.  Musculoskeletal:        General: Tenderness present. No deformity. Normal range of motion.     Cervical back: Normal range of motion.  Lymphadenopathy:     Cervical: No cervical adenopathy.  Skin:    General: Skin is warm and dry.     Findings: No rash.  Neurological:     Mental Status: He is alert and oriented to person, place, and time.     Cranial Nerves: No cranial nerve deficit.     Motor: No abnormal muscle tone.     Coordination: Coordination abnormal.     Gait: Gait abnormal.     Deep Tendon Reflexes: Reflexes are normal and symmetric.  Psychiatric:        Behavior: Behavior normal.        Thought Content: Thought content normal.        Judgment: Judgment normal.   LS pain, ataxic Antalgic gait  Lab Results  Component Value Date   WBC 3.8 (L) 02/17/2021   HGB 14.4 02/17/2021   HCT 42.2 02/17/2021   PLT 195.0 02/17/2021   GLUCOSE 95 09/04/2021   ALT 30 09/04/2021   AST 19 09/04/2021   NA 138 09/04/2021   K 4.1 09/04/2021   CL 103 09/04/2021   CREATININE 0.86 09/04/2021   BUN 18 09/04/2021   CO2 26 09/04/2021   TSH 1.26 01/14/2020   PSA 1.83 09/04/2021   HGBA1C 5.5 09/04/2021    DG INJECT DIAG/THERA/INC NEEDLE/CATH/PLC EPI/LUMB/SAC W/IMG  Result Date: 10/25/2021 CLINICAL DATA:  Low back and bilateral lower extremity pain to the feet. L4-S1 fusion with posterior decompression. Patent canal and foramina at these levels. Adjacent level degenerative change at L3-L4 with similar mild canal stenosis and mild to moderate bilateral foraminal  stenosis. EXAM: CAUDAL EPIDURAL INJECTION UNDER FLUOROSCOPIC GUIDANCE FLUOROSCOPY: Radiation Exposure Index (as provided by the fluoroscopic device): 3.9 mGy air Kerma PROCEDURE: After a thorough discussion of risks and benefits of the procedure including bleeding, infection, injury to nerves, blood vessels, and adjacent structures, written and oral informed consent was obtained. Consent was obtained. Specific risks to this procedure included injury to the terminal portion of  the thecal sac and/or nerve roots. Utilizing a caudal approach, the skin overlying the sacral hiatus was cleansed and anesthetized. A 20 gauge Crawford epidural needle was advanced into the sacral epidural space. Injection of Omnipaque 180 shows a good epidural pattern with spread up to L5-S1. No vascular or subarachnoid opacification is seen. '80mg'$  of Depo-Medrol mixed with 3 cc of normal saline and 5cc of 1% Lidocaine were instilled. The procedure was well-tolerated, and the patient was discharged thirty minutes following the injection in good condition. IMPRESSION: Technically successful  caudal epidural injection. Electronically Signed   By: Lucrezia Europe M.D.   On: 10/25/2021 15:42    Assessment & Plan:   Problem List Items Addressed This Visit     Dyslipidemia - Primary    Ordered cardiac CT scan for calcium scoring      Relevant Orders   CT CARDIAC SCORING (SELF PAY ONLY)   GERD (gastroesophageal reflux disease)    Worse Add Protonix  Ordered cardiac CT scan for calcium scoring      Relevant Medications   RABEprazole (ACIPHEX) 20 MG tablet   famotidine (PEPCID) 40 MG tablet   Low back pain    Cont on oxycodone 10 mg 3 times daily.   Potential benefits of a long term opioids use as well as potential risks (i.e. addiction risk, apnea etc) and complications (i.e. Somnolence, constipation and others) were explained to the patient and were aknowledged.      Relevant Medications   Oxycodone HCl 10 MG TABS   Oxycodone  HCl 10 MG TABS   Low vitamin B12 level    On B comlex         Meds ordered this encounter  Medications   RABEprazole (ACIPHEX) 20 MG tablet    Sig: Take 1 tablet (20 mg total) by mouth in the morning.    Dispense:  90 tablet    Refill:  3   famotidine (PEPCID) 40 MG tablet    Sig: Take 1 tablet (40 mg total) by mouth at bedtime.    Dispense:  90 tablet    Refill:  3   Oxycodone HCl 10 MG TABS    Sig: Take 1 tablet (10 mg total) by mouth 3 (three) times daily as needed.    Dispense:  90 tablet    Refill:  0    Please fill on or after 01/09/22   Oxycodone HCl 10 MG TABS    Sig: Take 1 tablet (10 mg total) by mouth 3 (three) times daily. Please fill on or after 11/10/21    Dispense:  90 tablet    Refill:  0    Please fill on or after 02/08/22      Follow-up: Return in about 2 months (around 03/14/2022) for a follow-up visit.  Walker Kehr, MD

## 2022-01-11 NOTE — Addendum Note (Signed)
Addended by: Earnstine Regal on: 01/11/2022 02:14 PM   Modules accepted: Orders

## 2022-01-17 NOTE — Progress Notes (Signed)
I, [name], LAT, ATC, am serving as scribe for Dr. [name].  Adam Vaughn is a 46 y.o. male who presents to [place] at [facility] today for f/u of B knee pain.  He was last seen by Dr. Georgina Snell on 11/21/21 for recent increase in L knee pain after falling down the stairs on 11/18/21.  He had prior B knee steroid injections on 10/19/21 and before that completed B knee Gelsyn injections on 08/14/21.  He is currently attending PT at [facility] for his R shoulder.  Today, pt reports that his B knee pain con't.  He feels like a lot of his knee pain is coming from his back because when he's having a bad day in terms of his back, his knees feel worse.  He reports con't mechanical knee symptoms, especially when squatting.  He has not worked in 2 years due to his back issues but is not on disability.  He also notes that the last ESI he had worked really well and is wondering if he can get another one of those.  Dx imaging: L knee XR- 11/21/21; R and L knee XR- 06/28/20  Pertinent review of systems: No fevers or chills  Relevant historical information: Lumbar radiculopathy and spinal stenosis.   Exam:  BP 120/80 (BP Location: Right Arm, Patient Position: Sitting, Cuff Size: Normal)   Pulse 78   Ht '5\' 8"'$  (1.727 m)   Wt 195 lb 9.6 oz (88.7 kg)   SpO2 96%   BMI 29.74 kg/m  General: Well Developed, well nourished, and in no acute distress.   MSK: L-spine: Nontender midline.  Decreased lumbar motion. Extremity strength is intact.    Lab and Radiology Results EXAM: MRI LUMBAR SPINE WITHOUT AND WITH CONTRAST  TECHNIQUE: Multiplanar and multiecho pulse sequences of the lumbar spine were obtained without and with intravenous contrast.  CONTRAST: 19 mL of MultiHance IV.  COMPARISON: MRI 02/13/2020.  FINDINGS: Segmentation: Standard segmentation is assumed. The inferior-most fully formed intervertebral disc is labeled L5-S1. This is consistent with prior MRI  numbering.  Alignment: Straightening of the normal lumbar lordosis with slight retrolisthesis of L3 on L4, similar to prior.  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 No specific evidence of acute fracture, discitis/osteomyelitis, or suspicious bone lesion.  Conus medullaris and cauda equina: Conus extends to the L1 level. Conus appears normal. No abnormal enhancement of the conus or cauda equina.  Paraspinal and other soft tissues: Postoperative changes in the lower posterior paraspinal soft tissues.  Disc levels:  T12-L1: No significant disc protrusion, foraminal stenosis, or canal stenosis.  L1-L2: No significant disc protrusion, foraminal stenosis, or canal stenosis.  L2-L3: Mild disc bulge and mild bilateral facet hypertrophy without significant canal or foraminal stenosis. Findings are similar to prior.  L3-L4: Degenerative disc height loss and desiccation. Similar mild retrolisthesis of L3 on L4. Broad disc bulge and mild bilateral facet hypertrophy with suspected underlying short pedicles. Similar mild to moderate bilateral foraminal stenosis. Similar mild canal stenosis.  L4-L5: Posterior fusion and decompression. Enhancing scar tissue in the lateral and dorsal epidural canal. Improved canal stenosis with patent canal. Foramina also appear patent.  L5-S1: Posterior fusion and decompression. Enhancing scar tissue in the lateral and dorsal epidural canal. Improved canal stenosis with patent canal. Foramina also appear patent.  IMPRESSION: 1. L4-S1 fusion with posterior decompression. Patent canal and foramina at these levels. 2. Adjacent level degenerative change at L3-L4 with similar mild canal stenosis  and mild to moderate bilateral foraminal stenosis.   Electronically Signed By: Margaretha Sheffield MD On: 01/19/2021 10:35      Assessment and Plan: 46 y.o. male with bilateral anterior knee pain thought to be  likely L3 or L4 radiculopathy.  L4 fits his pain pattern the best currently.  His MRI from July 2022 does show spinal stenosis at L3-4 which could be causing his current symptoms.  He had some benefit from an epidural steroid injection in April of this year at L5-S1.  I suspect that an epidural steroid injection located closer to L3-4 would be more helpful.  Plan for repeat epidural steroid injection and recheck back as needed.   PDMP not reviewed this encounter. Orders Placed This Encounter  Procedures   DG INJECT DIAG/THERA/INC NEEDLE/CATH/PLC EPI/LUMB/SAC W/IMG    Standing Status:   Future    Standing Expiration Date:   01/19/2023    Order Specific Question:   Reason for Exam (SYMPTOM  OR DIAGNOSIS REQUIRED)    Answer:   ESI repeat. Anterior knee pain. Suspect L3 or L4 pain. Level and technique per radiology.    Order Specific Question:   Preferred Imaging Location?    Answer:   [id number]    Order Specific Question:   Radiology Contrast Protocol - do NOT remove file path    Answer:   [email or web address]   No orders of the defined types were placed in this encounter.    Discussed warning signs or symptoms. Please see discharge instructions. Patient expresses understanding.   The above documentation has been reviewed and is accurate and complete [name], M.D.

## 2022-01-18 ENCOUNTER — Ambulatory Visit: Payer: Commercial Managed Care - HMO | Admitting: Family Medicine

## 2022-01-18 ENCOUNTER — Encounter: Payer: Self-pay | Admitting: Family Medicine

## 2022-01-18 VITALS — BP 120/80 | HR 78 | Ht 68.0 in | Wt 195.6 lb

## 2022-01-18 DIAGNOSIS — M5416 Radiculopathy, lumbar region: Secondary | ICD-10-CM | POA: Diagnosis not present

## 2022-01-18 DIAGNOSIS — M48062 Spinal stenosis, lumbar region with neurogenic claudication: Secondary | ICD-10-CM

## 2022-01-18 NOTE — Patient Instructions (Signed)
Thank you for coming in today.   Please call [facility] at [phone] to schedule your spine injection.    Let me know how the shot goes.

## 2022-01-23 ENCOUNTER — Ambulatory Visit (INDEPENDENT_AMBULATORY_CARE_PROVIDER_SITE_OTHER): Payer: Commercial Managed Care - HMO | Admitting: Physical Therapy

## 2022-01-23 ENCOUNTER — Encounter: Payer: Self-pay | Admitting: Physical Therapy

## 2022-01-23 DIAGNOSIS — M6281 Muscle weakness (generalized): Secondary | ICD-10-CM

## 2022-01-23 DIAGNOSIS — M25511 Pain in right shoulder: Secondary | ICD-10-CM

## 2022-01-23 DIAGNOSIS — R600 Localized edema: Secondary | ICD-10-CM

## 2022-01-23 DIAGNOSIS — M25611 Stiffness of right shoulder, not elsewhere classified: Secondary | ICD-10-CM | POA: Diagnosis not present

## 2022-01-23 DIAGNOSIS — R293 Abnormal posture: Secondary | ICD-10-CM

## 2022-01-23 NOTE — Therapy (Signed)
OUTPATIENT PHYSICAL THERAPY TREATMENT NOTE DISCHARGE SUMMARY    Patient Name: Adam Vaughn MRN: [id number] DOB:April 01, 1976, 46 y.o., male Today's Date: 01/23/2022  PCP: Cassandria Anger, MD REFERRING PROVIDER: Meredith Pel, MD  END OF SESSION:   PT End of Session - 01/23/22 1053     Visit Number 10    Number of Visits 16    Date for PT Re-Evaluation 02/20/22    Authorization Type [employer]    PT Start Time 1100    Activity Tolerance Patient tolerated treatment well    Behavior During Therapy [facility] for tasks assessed/performed                  Past Medical History:  Diagnosis Date   Arthritis    Chronic back pain    Family history of colon cancer    sister   GERD (gastroesophageal reflux disease)    History of kidney stones    Hypertension    Kidney stones    Past Surgical History:  Procedure Laterality Date   BACK SURGERY  04/11/2020   CHOLECYSTECTOMY N/A 10/01/2020   Procedure: LAPAROSCOPIC CHOLECYSTECTOMY;  Surgeon: Autumn Messing III, MD;  Location: [facility];  Service: General;  Laterality: N/A;   COLONOSCOPY  1991   IBS   endocolon  09/15/2021   [place], GERD   HAND SURGERY Bilateral    SHOULDER SURGERY Right 08/2021   SPINAL CORD STIMULATOR IMPLANT  2022   SPINAL CORD STIMULATOR REMOVAL  2022   TONSILLECTOMY     VASECTOMY     Patient Active Problem List   Diagnosis Date Noted   Dyslipidemia 01/11/2022   Chronic pain syndrome 11/07/2021   Piriformis syndrome 11/07/2021   Bladder neck obstruction 09/04/2021   Asthmatic bronchitis 09/04/2021   Pain in right shoulder 06/29/2021   Meteorism 06/01/2021   Sinus congestion 02/28/2021   Vertigo 02/17/2021   Headache 02/17/2021   Fever 02/17/2021   Falls 02/17/2021   Allergic rhinitis 10/25/2020   Family history of acute polio 10/25/2020   Chest pain, atypical 10/10/2020   S/P laparoscopic cholecystectomy 10/10/2020   Hyperglycemia 10/10/2020   Acute cholecystitis 09/30/2020   Insomnia  09/26/2020   Knee pain 06/23/2020   Low vitamin B12 level 05/31/2020   Ankle sprain 05/31/2020   Constipation by delayed colonic transit 05/31/2020   Degenerative spondylolisthesis 04/11/2020   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   DDD (degenerative disc disease), lumbar 01/20/2020   Left lumbar radiculitis 01/20/2020   Vitamin D deficiency 01/15/2020   Low back pain 01/12/2020   IBS (irritable bowel syndrome) 01/12/2020   Nephrolithiasis 01/12/2020   Erectile dysfunction 01/12/2020   GERD (gastroesophageal reflux disease) 01/12/2020    REFERRING DIAG: M25.511 (ICD-10-CM) - Right shoulder pain, unspecified chronicity   THERAPY DIAG:  Acute pain of right shoulder  Stiffness of right shoulder, not elsewhere classified  Muscle weakness (generalized)  Abnormal posture  Localized edema  PERTINENT HISTORY: OA, HTN, back surgery 2021  PRECAUTIONS: Fall; Shoulder Now okay for progressing ROM and light strengthening activities  Onset date: s/p Rt shoulder biceps tenodesis, labral repair and debridement on 09/11/21.  SUBJECTIVE: Doing much better; spasms have decreased significantly   PAIN:  Are you having pain? Yes: NPRS scale: 1/10 Pain location: Rt shoulder Pain description: sore aching Aggravating factors: when he moves it different ways Relieving factors: rest   OBJECTIVE: (objective measures completed at initial evaluation unless otherwise dated)   PATIENT SURVEYS:  10/12/21: FOTO 48 (  predicted 68) 12/12/21: FOTO 66 01/09/22: FOTO 61 01/23/22: FOTO 69                                   UPPER EXTREMITY ROM:    Passive ROM Right 10/12/2021  Shoulder flexion 90  Shoulder extension    Shoulder abduction 90  Shoulder adduction    Shoulder internal rotation 60 (in 55 deg abdct)  Shoulder external rotation 40 (In 55 deg abdct)  (Blank rows = not tested)   Active ROM Right 10/12/2021 Right 11/01/21 Right 10/31/21 Right 12/12/21  Right 01/09/22 Right 01/23/22  Shoulder flexion 90  135 155 155 (sitting) 155 (sitting) 160  Shoulder extension         Shoulder abduction 90  126 135 135 (sitting) 134 (sitting) 162  Shoulder adduction         Shoulder internal rotation   80 WFL FIR to T12 FIR to T12 FIR to T12  Shoulder external rotation   59 WFL 65 (sitting) 71 (sitting) 82 (sitting)  (Blank rows = not tested)   UPPER EXTREMITY MMT:    MMT Right 11/20/2021 Right 12/06/21 Right 01/09/22 Right 01/23/22  Shoulder flexion  $Remove'4 4  4 'hFYUIYO$ 4+/5  Shoulder extension        Shoulder abduction  4 4  3+/5 (with pain) 4+/5  Shoulder adduction        Shoulder internal rotation $RemoveBeforeDEI'4  4  4 'eQGMYchZkCNyBqnB$ (with pain) 5/5  Shoulder external rotation $RemoveBeforeDEI'4  4  5 'lnqXvOxKzxzLdMCo$ 5/5  (Blank rows = not tested)               TODAY'S TREATMENT: 01/23/22 ROM and MMT as noted above Discussed current HEP and pt reports no concerns FOTO score reviewed with patient  01/09/22 MMT and ROM assessments as noted above UBE L4 x 8 min (4' each direction) Mobilization with movement: IASTM percussive device to Rt bicep with bicep curls 2x10 to see if this helps to decrease spasms - continued bicep twitching after concentric/eccentric movement Bicep curls 2x10 with 2# weight, focus on full range and slow eccentrics which seemed to decrease muscle twitching  12/26/21 UBE L4 x 8 min (4' each direction) Rt row on cable 15# 3x10 Lat pull downs on cable 20# 3x10 Lat pull down with narrow grip and hands supinated 3x10 reps; 20# Rt shoulder bicep curl 5# , 3x10 supinated, 3x10 hammer curl ER stretch in sitting with 1# bar 3x1 min hold Sidelying ER ball toss 3x30 sec Sidelying horizontal abduction 3# on Rt 3x10 Supine Rt shoulder protraction 5# 3x10 Supine Rt shoulder circles CW/CCW in 90 deg flexion x 20 each, 5# Sitting Rt overhead press 5# 3x10 Plank position at counter with shoulder taps 2x10   12/19/21 UBE L4 x 8 min (alt 2' each direction) Sustained holds: alternating flexion and  abduction 2x30 sec, 3#, then 2# due to increased pain Rt shoulder bicep curl 5# X 10 pronated, X 10 supinated, X 10 hammer curl ER reactive isometrics 5# 2x10; IR reactive isometrics 15# x 10 (stopped due to back pain) Rt row on cable 15# 3x10 Lat pull downs on cable 20# 2x10 Lat pull down with narrow grip and hands supinated x20 reps; 15# Plank position at counter with shoulder taps 2x10   PATIENT EDUCATION: Education details: HEP Person educated: Patient Education method: Consulting civil engineer, Media planner, and Handouts Education comprehension: verbalized understanding, returned demonstration, and needs further education  HOME EXERCISE PROGRAM: Access Code: [email or web address] URL: [email or web address] Date: 12/12/2021 Prepared by: Faustino Congress  Exercises - Standing Row with Anchored Resistance  - 1-2 x daily - 7 x weekly - 1-2 sets - 10 reps - 5 se hold - Shoulder Internal Rotation with Resistance  - 1-2 x daily - 7 x weekly - 1-2 sets - 10 reps - Shoulder External Rotation with Anchored Resistance  - 1-2 x daily - 7 x weekly - 1-2 sets - 10 reps - Seated Shoulder External Rotation AAROM with Cane and Hand in Neutral  - 1-2 x daily - 7 x weekly - 1-2 sets - 10 reps - 5 sec hold - Standing Shoulder External Rotation Stretch in Doorway  - 1-2 x daily - 7 x weekly - 1 sets - 3 reps - 15-20 sec hold - Seated Single Arm Shoulder Flexion with Dumbbells  - 1 x daily - 7 x weekly - 3 sets - 10 reps - Seated Single Arm Shoulder Abduction with Dumbbell - Thumb Up  - 1 x daily - 7 x weekly - 3 sets - 10 reps - Seated Single Arm Shoulder Press with Dumbbell  - 1 x daily - 7 x weekly - 3 sets - 10 reps - Seated Single Arm Bicep Curls Supinated with Dumbbell  - 1 x daily - 7 x weekly - 3 sets - 10 reps   ASSESSMENT:   CLINICAL IMPRESSION: Pt has met all goals and spasms have significantly decreased over the past week.  He has well established HEP to continue to maximize function.  Will  d/c PT today.    OBJECTIVE IMPAIRMENTS decreased ROM, decreased strength, hypomobility, increased fascial restrictions, increased muscle spasms, impaired UE functional use, and pain.    ACTIVITY LIMITATIONS cleaning, community activity, driving, meal prep, and laundry.    PERSONAL FACTORS 3+ comorbidities: OA, HTN, hx back surgery Nov 2021  are also affecting patient's functional outcome.      REHAB POTENTIAL: Good   CLINICAL DECISION MAKING: Evolving/moderate complexity   EVALUATION COMPLEXITY: Moderate     GOALS: Goals reviewed with patient? Yes   SHORT TERM GOALS: Target date: 11/09/2021   Independent with initial HEP Goal status: MET, relays compliance 11/20/21   2.  Rt shoulder active flexion and abduction to 90 deg for improved function Goal status: MET, see above measurements, now >135 deg since 11/01/21      LONG TERM GOALS: Target date: 02/20/22   Independent with final HEP Goal status: MET 01/23/22   2.  FOTO score improved to 68 Goal status: MET 01/23/22   3.  Rt shoulder AROM improved to [facility] for improved mobility and function Goal status: MET 01/23/22   4.  Report pain < 3/10 with activity for improved function Goal status: met 12/12/21   5.  Rt shoulder strength at least 4/5 for improved function Goal status: met 12/12/21  6. Improve Rt shoulder ER to at least 80 deg for improved function Goal status: MET 01/23/22  7. Improve Rt shoulder strength to at least 4+/5 for improved function Goal status: MET 01/23/22       PLAN: PT FREQUENCY: 1x every other week, will see up to 1x/wk PRN   PT DURATION: 6 weeks   PLANNED INTERVENTIONS: Therapeutic exercises, Therapeutic activity, Neuromuscular re-education, Patient/Family education, Joint mobilization, Dry Needling, Electrical stimulation, Cryotherapy, Moist heat, Taping, Vasopneumatic device, and Manual therapy   PLAN FOR NEXT SESSION: d/c PT today   Laureen Abrahams, PT, DPT 01/23/22  10:57  AM     PHYSICAL THERAPY DISCHARGE SUMMARY  Visits from Start of Care: 10  Current functional level related to goals / functional outcomes: See above   Remaining deficits: See above   Education / Equipment: HEP   Patient agrees to discharge. Patient goals were met. Patient is being discharged due to meeting the stated rehab goals.  Laureen Abrahams, PT, DPT 01/23/22 11:15 AM  [facility] [address] [place], [place], [address] Phone: [phone]   Fax:  [phone]

## 2022-01-31 ENCOUNTER — Ambulatory Visit
Admission: RE | Admit: 2022-01-31 | Discharge: 2022-01-31 | Disposition: A | Discharge status: Home / Self Care | Payer: Commercial Managed Care - HMO | Source: Ambulatory Visit | Attending: Family Medicine | Admitting: Family Medicine

## 2022-01-31 DIAGNOSIS — M48062 Spinal stenosis, lumbar region with neurogenic claudication: Secondary | ICD-10-CM

## 2022-01-31 DIAGNOSIS — M5416 Radiculopathy, lumbar region: Secondary | ICD-10-CM

## 2022-01-31 MED ORDER — IOPAMIDOL (ISOVUE-M 200) INJECTION 41%
1.0000 mL | Freq: Once | INTRAMUSCULAR | Status: AC
  Administered 2022-01-31: 1 mL via EPIDURAL

## 2022-01-31 MED ORDER — METHYLPREDNISOLONE ACETATE 40 MG/ML INJ SUSP (RADIOLOG
80.0000 mg | Freq: Once | INTRAMUSCULAR | Status: AC
  Administered 2022-01-31: 80 mg via EPIDURAL

## 2022-01-31 NOTE — Discharge Instructions (Signed)

## 2022-02-08 ENCOUNTER — Other Ambulatory Visit: Payer: Self-pay | Admitting: Internal Medicine

## 2022-02-08 ENCOUNTER — Telehealth: Payer: Self-pay

## 2022-02-08 ENCOUNTER — Other Ambulatory Visit (HOSPITAL_COMMUNITY): Payer: Self-pay

## 2022-02-08 MED ORDER — ONDANSETRON HCL 4 MG PO TABS
4.0000 mg | ORAL_TABLET | Freq: Three times a day (TID) | ORAL | 0 refills | Status: DC | PRN
Start: 2022-02-08 — End: 2022-03-06
  Filled 2022-02-08: qty 20, 7d supply, fill #0

## 2022-02-08 NOTE — Telephone Encounter (Signed)
DOD This is a patient of Dr Ardis Hughs who had to be rescheduled to a later date. Last seen by GI 11/01/21. History of IBS and GERD. He reached out to [place] asking for medication to treat his nausea. He is on chronic pain medications for his back. His appointment has been rescheduled to 03/06/22 with Dr Bryan Lemma. Zofran was originally prescribed by PCP in June #20 with no refills. Is it okay to give him a refill to cover until the 03/06/22 appointment here?    Eliyohu, Class "Ramie"  Greggory Keen, LPN Phone Number: [phone]   Take oxycodone '10mg'$  2 to 3 times a day  Bentyl everyday  Nausea almost everyday  Cannot use the bathroom without fleet  Didn't start that till after i had my [employer] do t make since was the other way around  Also take Naproxen 500 mg was everyday but stopped because seems to make my stomach hurt more but idk

## 2022-02-09 NOTE — Telephone Encounter (Signed)
Patient advised.

## 2022-02-16 ENCOUNTER — Ambulatory Visit: Payer: Commercial Managed Care - HMO | Admitting: Gastroenterology

## 2022-02-19 ENCOUNTER — Ambulatory Visit: Payer: Commercial Managed Care - HMO | Admitting: Orthopedic Surgery

## 2022-02-19 ENCOUNTER — Ambulatory Visit: Payer: Self-pay

## 2022-02-19 DIAGNOSIS — M1611 Unilateral primary osteoarthritis, right hip: Secondary | ICD-10-CM | POA: Diagnosis not present

## 2022-02-19 DIAGNOSIS — M25551 Pain in right hip: Secondary | ICD-10-CM

## 2022-02-20 ENCOUNTER — Encounter: Payer: Self-pay | Admitting: Orthopedic Surgery

## 2022-02-20 MED ORDER — METHYLPREDNISOLONE ACETATE 40 MG/ML IJ SUSP
40.0000 mg | INTRAMUSCULAR | Status: AC | PRN
  Administered 2022-02-19: 40 mg via INTRA_ARTICULAR

## 2022-02-20 MED ORDER — BUPIVACAINE HCL 0.25 % IJ SOLN
4.0000 mL | INTRAMUSCULAR | Status: AC | PRN
  Administered 2022-02-19: 4 mL via INTRA_ARTICULAR

## 2022-02-20 MED ORDER — LIDOCAINE HCL 1 % IJ SOLN
5.0000 mL | INTRAMUSCULAR | Status: AC | PRN
  Administered 2022-02-19: 5 mL

## 2022-02-20 NOTE — Progress Notes (Signed)
Office Visit Note   Patient: Adam Vaughn           Date of Birth: [birth year]           MRN: [id number] Visit Date: 02/19/2022 Requested by: Cassandria Anger, MD [place],  [place] [address] PCP: Cassandria Anger, MD  Subjective: Chief Complaint  Patient presents with   Right Shoulder - Follow-up   Right Hip - Pain    HPI: Adam Vaughn is a 46 y.o. male who presents to the office 5 months postop right shoulder arthroscopy with biceps tenodesis and labral repair.  reports that he is doing better compared with last visit 2 months ago.  Still notes.occasional  spasms in the bicep muscle.  This does not complain of any weakness.  His left shoulder actually bothers him more than his right shoulder.  He also complains of continued hip pain in the right hip which he has been seen for before.  He has previous radiographs from earlier this year in May that did not show any significant pathology.  Notes some mechanical clicking sensation in the hip.  Localizes most of his pain to the groin..  Patient also has a history of significant back pain with radiculopathy being for most and the differential diagnosis for this hip pain.              ROS: All systems reviewed are negative as they relate to the chief complaint within the history of present illness.  Patient denies fevers or chills.  Assessment & Plan: Visit Diagnoses:  1. Arthritis of right hip   2. Pain in right hip     Plan: Patient is a 46 year old male who returns 5 months postop from right shoulder arthroscopy with biceps tenodesis and labral repair.  Overall feels he is making good progress and has not plateaued yet.  He is just doing home exercise program with exercise bands at home.  Main complaint is the spasms in the muscle of the bicep but this is seeming to continually improve.  He also complains of right hip pain today that he localizes to the groin.  Previous radiographs have demonstrated minimal  arthritis in the right hip.  Think would be reasonable to try diagnostic and therapeutic intra-articular right hip injection which was administered under ultrasound guidance today.  He tolerated the procedure well.  If he has no relief from this injection, suspect that his hip pain is likely referred from the lumbar spine.  Patient with complaints of left shoulder pain that is bothering him more than the operative shoulder.  He would like to try left shoulder injection.  He will follow-up in 1 week for this.  We can see how he did with the hip injection next week.  During the anesthetic phase of the injection however he did note some improvement.  Follow-Up Instructions: No follow-ups on file.   Orders:  Orders Placed This Encounter  Procedures   US Guided Needle Placement - No Linked Charges   No orders of the defined types were placed in this encounter.     Procedures: Large Joint Inj: R hip joint on 02/19/2022 11:22 AM Indications: pain and diagnostic evaluation Details: 18 G 3.5 in needle, ultrasound-guided lateral approach  Arthrogram: No  Medications: 5 mL lidocaine 1 %; 4 mL bupivacaine 0.25 %; 40 mg methylPREDNISolone acetate 40 MG/ML Outcome: tolerated well, no immediate complications Procedure, treatment alternatives, risks and benefits explained, specific risks discussed. Consent was  given by the patient. Immediately prior to procedure a time out was called to verify the correct patient, procedure, equipment, support staff and site/side marked as required. Patient was prepped and draped in the usual sterile fashion.       Clinical Data: No additional findings.  Objective: Vital Signs: There were no vitals taken for this visit.  Physical Exam:  Constitutional: Patient appears well-developed HEENT:  Head: Normocephalic Eyes:EOM are normal Neck: Normal range of motion Cardiovascular: Normal rate Pulmonary/chest: Effort normal Neurologic: Patient is alert Skin: Skin  is warm Psychiatric: Patient has normal mood and affect  Ortho Exam: Ortho exam demonstrates 45 degrees X rotation, 150 abduction, 180 degrees forward flexion.  Excellent supination and bicep flexion strength of the right upper extremity.  Incisions are well-healed from prior surgery.  Contour of the bicep appears limited to the contralateral side.  No Popeye deformity.  Increased pain with FADIR test of the right hip and positive Stinchfield sign.  Negative straight leg raise.  No calf tenderness.  Negative Homans' sign.  No tenderness over the greater trochanter.  Excellent rotator cuff strength of supra, infra, subscap of the right shoulder.  Specialty Comments:  No specialty comments available.  Imaging: No results found.   PMFS History: Patient Active Problem List   Diagnosis Date Noted   Dyslipidemia 01/11/2022   Chronic pain syndrome 11/07/2021   Piriformis syndrome 11/07/2021   Bladder neck obstruction 09/04/2021   Asthmatic bronchitis 09/04/2021   Pain in right shoulder 06/29/2021   Meteorism 06/01/2021   Sinus congestion 02/28/2021   Vertigo 02/17/2021   Headache 02/17/2021   Fever 02/17/2021   Falls 02/17/2021   Allergic rhinitis 10/25/2020   Family history of acute polio 10/25/2020   Chest pain, atypical 10/10/2020   S/P laparoscopic cholecystectomy 10/10/2020   Hyperglycemia 10/10/2020   Acute cholecystitis 09/30/2020   Insomnia 09/26/2020   Knee pain 06/23/2020   Low vitamin B12 level 05/31/2020   Ankle sprain 05/31/2020   Constipation by delayed colonic transit 05/31/2020   Degenerative spondylolisthesis 04/11/2020   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   DDD (degenerative disc disease), lumbar 01/20/2020   Left lumbar radiculitis 01/20/2020   Vitamin D deficiency 01/15/2020   Low back pain 01/12/2020   IBS (irritable bowel syndrome) 01/12/2020   Nephrolithiasis 01/12/2020   Erectile dysfunction 01/12/2020   GERD  (gastroesophageal reflux disease) 01/12/2020   Past Medical History:  Diagnosis Date   Arthritis    Chronic back pain    Family history of colon cancer    sister   GERD (gastroesophageal reflux disease)    History of kidney stones    Hypertension    Kidney stones     Family History  Problem Relation Age of Onset   Hypertension Father    Heart attack Father    Other Father        Small intestine removed but does not know why   Diabetes Sister    Heart disease Sister    Colon cancer Sister    Colon polyps Sister    Colon polyps Sister    Healthy Sister    Healthy Sister    Healthy Sister    Other Brother        MVA   Healthy Brother    Healthy Brother    Healthy Brother    Healthy Brother    Healthy Brother    Healthy Brother    Liver disease Neg Hx  Pancreatic cancer Neg Hx    Esophageal cancer Neg Hx    Stomach cancer Neg Hx     Past Surgical History:  Procedure Laterality Date   BACK SURGERY  04/11/2020   CHOLECYSTECTOMY N/A 10/01/2020   Procedure: LAPAROSCOPIC CHOLECYSTECTOMY;  Surgeon: Jovita Kussmaul, MD;  Location: [facility];  Service: General;  Laterality: N/A;   COLONOSCOPY  1991   IBS   endocolon  09/15/2021   [place], GERD   HAND SURGERY Bilateral    SHOULDER SURGERY Right 08/2021   SPINAL CORD STIMULATOR IMPLANT  2022   SPINAL CORD STIMULATOR REMOVAL  2022   TONSILLECTOMY     VASECTOMY     Social History   Occupational History   Not on file  Tobacco Use   Smoking status: Never   Smokeless tobacco: Never  Vaping Use   Vaping Use: Never used  Substance and Sexual Activity   Alcohol use: Not Currently    Comment: states uit around age 83   Drug use: Never   Sexual activity: Not Currently    Birth control/protection: Surgical    Comment: vastectomy

## 2022-02-28 ENCOUNTER — Ambulatory Visit: Payer: Self-pay

## 2022-02-28 ENCOUNTER — Ambulatory Visit: Payer: Commercial Managed Care - HMO | Admitting: Orthopedic Surgery

## 2022-02-28 DIAGNOSIS — M25512 Pain in left shoulder: Secondary | ICD-10-CM

## 2022-02-28 DIAGNOSIS — M65812 Other synovitis and tenosynovitis, left shoulder: Secondary | ICD-10-CM

## 2022-03-06 ENCOUNTER — Other Ambulatory Visit: Payer: Commercial Managed Care - HMO

## 2022-03-06 ENCOUNTER — Encounter: Payer: Self-pay | Admitting: Orthopedic Surgery

## 2022-03-06 ENCOUNTER — Encounter: Payer: Self-pay | Admitting: Gastroenterology

## 2022-03-06 ENCOUNTER — Ambulatory Visit: Payer: Commercial Managed Care - HMO | Admitting: Gastroenterology

## 2022-03-06 VITALS — BP 128/88 | HR 94 | Ht 68.0 in | Wt 192.5 lb

## 2022-03-06 DIAGNOSIS — R110 Nausea: Secondary | ICD-10-CM

## 2022-03-06 DIAGNOSIS — K582 Mixed irritable bowel syndrome: Secondary | ICD-10-CM | POA: Diagnosis not present

## 2022-03-06 DIAGNOSIS — M65812 Other synovitis and tenosynovitis, left shoulder: Secondary | ICD-10-CM

## 2022-03-06 DIAGNOSIS — Z86010 Personal history of colonic polyps: Secondary | ICD-10-CM

## 2022-03-06 DIAGNOSIS — K5903 Drug induced constipation: Secondary | ICD-10-CM | POA: Diagnosis not present

## 2022-03-06 DIAGNOSIS — R109 Unspecified abdominal pain: Secondary | ICD-10-CM

## 2022-03-06 DIAGNOSIS — K219 Gastro-esophageal reflux disease without esophagitis: Secondary | ICD-10-CM

## 2022-03-06 DIAGNOSIS — T402X5A Adverse effect of other opioids, initial encounter: Secondary | ICD-10-CM

## 2022-03-06 MED ORDER — METHYLPREDNISOLONE ACETATE 40 MG/ML IJ SUSP
40.0000 mg | INTRAMUSCULAR | Status: AC | PRN
  Administered 2022-03-06: 40 mg via INTRA_ARTICULAR

## 2022-03-06 MED ORDER — HYOSCYAMINE SULFATE 0.125 MG SL SUBL: 0.1250 mg | SUBLINGUAL_TABLET | Freq: Four times a day (QID) | SUBLINGUAL | 2 refills | Status: DC | PRN

## 2022-03-06 MED ORDER — FAMOTIDINE 40 MG PO TABS: 40.0000 mg | ORAL_TABLET | Freq: Every day | ORAL | 3 refills | Status: DC

## 2022-03-06 MED ORDER — BUPIVACAINE HCL 0.5 % IJ SOLN
9.0000 mL | INTRAMUSCULAR | Status: AC | PRN
  Administered 2022-03-06: 9 mL via INTRA_ARTICULAR

## 2022-03-06 MED ORDER — NALOXEGOL OXALATE 25 MG PO TABS: 25.0000 mg | ORAL_TABLET | Freq: Every day | ORAL | 11 refills | Status: DC

## 2022-03-06 MED ORDER — RABEPRAZOLE SODIUM 20 MG PO TBEC: 20.0000 mg | DELAYED_RELEASE_TABLET | Freq: Every morning | ORAL | 3 refills | Status: DC

## 2022-03-06 MED ORDER — LIDOCAINE HCL 1 % IJ SOLN
5.0000 mL | INTRAMUSCULAR | Status: AC | PRN
  Administered 2022-03-06: 5 mL

## 2022-03-06 MED ORDER — ONDANSETRON HCL 4 MG PO TABS: 4.0000 mg | ORAL_TABLET | Freq: Four times a day (QID) | ORAL | 0 refills | Status: DC | PRN

## 2022-03-06 NOTE — Progress Notes (Unsigned)
Chief Complaint:    Abdominal cramping, constipation, nausea  GI History: 46 year old male with history of HTN, dyslipidemia, nephrolithiasis, chronic back pain, ccy, follows in the GI clinic for the following:  1.  Chronic GERD which led to eventual EGD 08/2021 Dr. Ardis Hughs found mild nonspecific gastritis which was negative for H. pylori.  Reflux symptoms generally controlled with rabeprazole 20 mg/day along with famotidine qhs.   2.  Family history of colon cancer. his sister had colon cancer in her 38s.  Colonoscopy 08/2021 found a single subcentimeter tubular adenoma, also a 20 cm segment in the left colon that was mildly erythematous.  Biopsy showed acute inflammation and the pathologist felt the changes might be related to a "self-limited colitis" or medication effect or perhaps related to diverticulosis.  This is likely due to NSAIDs, he was taking naproxen regularly.  Recall colonoscopy at 5-year interval recommended.  3.  IBS mixed type.  Longstanding history of alternating stools between no BM x3-4 days followed by liquid stools for many years.  No improvement with daily probiotic. + Abdominal cramping which resolves with BM.  Started Citrucel and Bentyl in 10/2021.  Normal celiac panel 11/2021.   HPI:     Patient is a 46 y.o. male presenting to the Gastroenterology Clinic for follow-up.  Last seen by Dr. Ardis Hughs on 11/01/2021.  Main issue at that time was alternating bowel habits, which vacillating between constipation x3-4 days of no BM and liquid stools.  Was diagnosed with IBS mixed type.  Started Citrucel and Bentyl  Had increasing abdominal pain and nausea about 3-4 weeks ago.  Started taking Zofran 4 mg.  He is also taking oxycodone 10 mg 2-3 times daily (has been taking since 04/2020).  Started using Fleet enema for constipation. Not much imrpovmenbt with Bentyl. Using Citrucel.   Dulcolax, citrucel, Miralax, fleet enema, and OTC stool softener, with no no BM x1.5 weeks. Had  associated abdominal cramping, and nausea.   Requesting RF of Aciphex, Pepcid, and Zofran.   Review of systems:     No chest pain, no SOB, no fevers, no urinary sx   Past Medical History:  Diagnosis Date   Arthritis    Chronic back pain    Family history of colon cancer    sister   GERD (gastroesophageal reflux disease)    History of kidney stones    Hypertension    Kidney stones     Patient's surgical history, family medical history, social history, medications and allergies were all reviewed in Epic    Current Outpatient Medications  Medication Sig Dispense Refill   albuterol (PROAIR HFA) 108 (90 Base) MCG/ACT inhaler Inhale 2 puffs into the lungs every 6 (six) hours as needed for wheezing or shortness of breath. 3 each 1   dicyclomine (BENTYL) 10 MG capsule Take one tablet one to two times daily as needed for abdominal cramping 50 capsule 1   Docusate Calcium (STOOL SOFTENER PO) Take 1 tablet by mouth daily.     famotidine (PEPCID) 40 MG tablet Take 1 tablet (40 mg total) by mouth at bedtime. 90 tablet 3   fluticasone (FLONASE) 50 MCG/ACT nasal spray Place 2 sprays into both nostrils daily. 16 g 6   loratadine (CLARITIN) 10 MG tablet Take 10 mg by mouth daily as needed for allergies.     methylcellulose (CITRUCEL) oral powder Take 1 packet by mouth daily.     naproxen (NAPROSYN) 500 MG tablet Take 1 tablet (500 mg total) by mouth  2 (two) times daily as needed for moderate pain. 180 tablet 0   ondansetron (ZOFRAN) 4 MG tablet Take 1 tablet by mouth every 8 hours as needed for nausea or vomiting. 20 tablet 0   Oxycodone HCl 10 MG TABS Take 1 tablet (10 mg total) by mouth 3 (three) times daily as needed. 90 tablet 0   Oxycodone HCl 10 MG TABS Take 1 tablet (10 mg total) by mouth 3 (three) times daily. Please fill on or after 11/10/21 90 tablet 0   oxymetazoline (AFRIN) 0.05 % nasal spray Place 1 spray into both nostrils daily.     pregabalin (LYRICA) 150 MG capsule Take 1 capsule  (150 mg total) by mouth 2 (two) times daily. 60 capsule 5   RABEprazole (ACIPHEX) 20 MG tablet Take 1 tablet (20 mg total) by mouth in the morning. 90 tablet 3   tiZANidine (ZANAFLEX) 4 MG capsule Take 1 capsule (4 mg total) by mouth 3 (three) times daily. 180 capsule 0   tiZANidine (ZANAFLEX) 4 MG tablet TAKE 1 TABLET BY MOUTH EVERY 6 HOURS AS NEEDED FOR MUSCLE SPASM 60 tablet 0   No current facility-administered medications for this visit.    Physical Exam:     BP 128/88   Pulse 94   Ht '5\' 8"'$  (1.727 m)   Wt 192 lb 8 oz (87.3 kg)   BMI 29.27 kg/m   GENERAL:  Pleasant *** in NAD PSYCH: : Cooperative, normal affect EENT:  conjunctiva pink, mucous membranes moist, neck supple without masses CARDIAC:  RRR, ***murmur heard, no peripheral edema PULM: Normal respiratory effort, lungs CTA bilaterally, no wheezing ABDOMEN:  Nondistended, soft, nontender. No obvious masses, no hepatomegaly,  normal bowel sounds SKIN:  turgor, no lesions seen Musculoskeletal:  Normal muscle tone, normal strength NEURO: Alert and oriented x 3, no focal neurologic deficits   IMPRESSION and PLAN:    1)   Change Bentyl to Levsin Trial Movantik - Check fecal calprotectin (patient concerned about focal active colitis on recent colonoscopy bxs) - Refill for Pepcid and Aciphex - Rx for Zofran 4 mg PO prn Q6H  RTC in 6 months             [place] ,DO, FACG 03/06/2022, 10:03 AM

## 2022-03-06 NOTE — Progress Notes (Signed)
   Procedure Note  Patient: Adam Vaughn             Date of Birth: 08-08-1975           MRN: [id number]             Visit Date: 02/28/2022  Procedures: Visit Diagnoses:  1. Left shoulder pain, unspecified chronicity     Large Joint Inj: L glenohumeral on 03/06/2022 1:25 PM Indications: diagnostic evaluation and pain Details: 18 G 1.5 in needle, posterior approach  Arthrogram: No  Medications: 9 mL bupivacaine 0.5 %; 40 mg methylPREDNISolone acetate 40 MG/ML; 5 mL lidocaine 1 % Outcome: tolerated well, no immediate complications Procedure, treatment alternatives, risks and benefits explained, specific risks discussed. Consent was given by the patient. Immediately prior to procedure a time out was called to verify the correct patient, procedure, equipment, support staff and site/side marked as required. Patient was prepped and draped in the usual sterile fashion.    Presents for scheduled left shoulder injection.  The hip injection he received last week did give him substantial relief.  This is encouraging in terms of back versus hip source of pain.  Left shoulder injection performed today.  We will see how he does with that.  Follow-up as needed.  MRI scanning next step on the shoulder.

## 2022-03-06 NOTE — Patient Instructions (Addendum)
_______________________________________________________  If you are age 46 or older, your body mass index should be between 23-30. Your Body mass index is 29.27 kg/m. If this is out of the aforementioned range listed, please consider follow up with your Primary Care Provider.  If you are age 68 or younger, your body mass index should be between 19-25. Your Body mass index is 29.27 kg/m. If this is out of the aformentioned range listed, please consider follow up with your Primary Care Provider.   ________________________________________________________  The [facility] providers would like to encourage you to use [facility] to communicate with providers for non-urgent requests or questions.  Due to long hold times on the telephone, sending your provider a message by [facility] may be a faster and more efficient way to get a response.  Please allow 48 business hours for a response.  Please remember that this is for non-urgent requests.  _______________________________________________________  Your provider has requested that you go to the basement level for lab work before leaving today. Press "B" on the elevator. The lab is located at the first door on the left as you exit the elevator.  We have sent the following medications to your pharmacy for you to pick up at your convenience: Levsin. Stop Bentyl Pepcid Aciphex Zofran Movantik  Please follow up in 6 months. Give [place] a call at [phone] to schedule an appointment.  It was a pleasure to see you today!  Vito Cirigliano, D.O.

## 2022-03-09 ENCOUNTER — Telehealth: Payer: Self-pay | Admitting: Pharmacy Technician

## 2022-03-09 ENCOUNTER — Other Ambulatory Visit (HOSPITAL_COMMUNITY): Payer: Self-pay

## 2022-03-09 NOTE — Telephone Encounter (Signed)
Patient Advocate Encounter  Prior Authorization for Trilby '25MG'$  tablets has been approved.    Effective: 03-09-2022 to 03-09-2023  Test claim returns a $185.84 co-pay

## 2022-03-09 NOTE — Telephone Encounter (Signed)
Patient Advocate Encounter  Received notification that prior authorization for Adam Vaughn is required.   PA submitted on 9.8.23 Key [id number] Status is pending    Luciano Cutter, CPhT Patient Advocate Phone: [phone]

## 2022-03-13 ENCOUNTER — Encounter: Payer: Self-pay | Admitting: Gastroenterology

## 2022-03-13 NOTE — Telephone Encounter (Signed)
Please see if his insurance will cover Relistor 45 mg PO daily or Linzess 145 mcg daily.

## 2022-03-14 ENCOUNTER — Other Ambulatory Visit: Payer: Self-pay

## 2022-03-14 ENCOUNTER — Ambulatory Visit (INDEPENDENT_AMBULATORY_CARE_PROVIDER_SITE_OTHER): Payer: Commercial Managed Care - HMO | Admitting: Internal Medicine

## 2022-03-14 ENCOUNTER — Encounter: Payer: Self-pay | Admitting: Internal Medicine

## 2022-03-14 VITALS — BP 122/74 | HR 85 | Temp 98.2°F | Ht 68.0 in | Wt 190.2 lb

## 2022-03-14 DIAGNOSIS — G4700 Insomnia, unspecified: Secondary | ICD-10-CM

## 2022-03-14 DIAGNOSIS — E785 Hyperlipidemia, unspecified: Secondary | ICD-10-CM

## 2022-03-14 DIAGNOSIS — K219 Gastro-esophageal reflux disease without esophagitis: Secondary | ICD-10-CM

## 2022-03-14 DIAGNOSIS — G8929 Other chronic pain: Secondary | ICD-10-CM | POA: Diagnosis not present

## 2022-03-14 DIAGNOSIS — M545 Low back pain, unspecified: Secondary | ICD-10-CM | POA: Diagnosis not present

## 2022-03-14 DIAGNOSIS — E538 Deficiency of other specified B group vitamins: Secondary | ICD-10-CM

## 2022-03-14 DIAGNOSIS — K5903 Drug induced constipation: Secondary | ICD-10-CM

## 2022-03-14 LAB — CBC WITH DIFFERENTIAL/PLATELET
Basophils Absolute: 0 10*3/uL (ref 0.0–0.1)
Basophils Relative: 0.4 % (ref 0.0–3.0)
Eosinophils Absolute: 0.1 10*3/uL (ref 0.0–0.7)
Eosinophils Relative: 0.8 % (ref 0.0–5.0)
HCT: 44.2 % (ref 39.0–52.0)
Hemoglobin: 15 g/dL (ref 13.0–17.0)
Lymphocytes Relative: 16 % (ref 12.0–46.0)
Lymphs Abs: 1.3 10*3/uL (ref 0.7–4.0)
MCHC: 33.9 g/dL (ref 30.0–36.0)
MCV: 88.6 fl (ref 78.0–100.0)
Monocytes Absolute: 0.6 10*3/uL (ref 0.1–1.0)
Monocytes Relative: 6.9 % (ref 3.0–12.0)
Neutro Abs: 6.2 10*3/uL (ref 1.4–7.7)
Neutrophils Relative %: 75.9 % (ref 43.0–77.0)
Platelets: 257 10*3/uL (ref 150.0–400.0)
RBC: 4.99 Mil/uL (ref 4.22–5.81)
RDW: 13.1 % (ref 11.5–15.5)
WBC: 8.2 10*3/uL (ref 4.0–10.5)

## 2022-03-14 LAB — URINALYSIS, ROUTINE W REFLEX MICROSCOPIC
Bilirubin Urine: NEGATIVE
Ketones, ur: NEGATIVE
Leukocytes,Ua: NEGATIVE
Nitrite: NEGATIVE
Specific Gravity, Urine: 1.025 (ref 1.000–1.030)
Total Protein, Urine: NEGATIVE
Urine Glucose: NEGATIVE
Urobilinogen, UA: 0.2 (ref 0.0–1.0)
pH: 5.5 (ref 5.0–8.0)

## 2022-03-14 LAB — TSH: TSH: 0.65 u[IU]/mL (ref 0.35–5.50)

## 2022-03-14 LAB — COMPREHENSIVE METABOLIC PANEL
ALT: 31 U/L (ref 0–53)
AST: 19 U/L (ref 0–37)
Albumin: 4.3 g/dL (ref 3.5–5.2)
Alkaline Phosphatase: 92 U/L (ref 39–117)
BUN: 13 mg/dL (ref 6–23)
CO2: 27 mEq/L (ref 19–32)
Calcium: 9.3 mg/dL (ref 8.4–10.5)
Chloride: 105 mEq/L (ref 96–112)
Creatinine, Ser: 0.93 mg/dL (ref 0.40–1.50)
GFR: 98.45 mL/min (ref >60.00)
Glucose, Bld: 88 mg/dL (ref 70–99)
Potassium: 4 mEq/L (ref 3.5–5.1)
Sodium: 139 mEq/L (ref 135–145)
Total Bilirubin: 0.4 mg/dL (ref 0.2–1.2)
Total Protein: 7.1 g/dL (ref 6.0–8.3)

## 2022-03-14 LAB — LIPID PANEL
Cholesterol: 171 mg/dL (ref 0–200)
HDL: 50.2 mg/dL (ref >39.00)
LDL Cholesterol: 88 mg/dL (ref 0–99)
NonHDL: 120.82
Total CHOL/HDL Ratio: 3
Triglycerides: 165 mg/dL — ABNORMAL HIGH (ref 0.0–149.0)
VLDL: 33 mg/dL (ref 0.0–40.0)

## 2022-03-14 MED ORDER — FLUTICASONE PROPIONATE 50 MCG/ACT NA SUSP: 2.0000 | Freq: Every day | NASAL | 6 refills | Status: AC

## 2022-03-14 MED ORDER — RELISTOR 150 MG PO TABS: 150.0000 mg | ORAL_TABLET | Freq: Every day | ORAL | 3 refills | Status: DC

## 2022-03-14 MED ORDER — OXYCODONE HCL 10 MG PO TABS: 10.0000 mg | ORAL_TABLET | Freq: Three times a day (TID) | ORAL | 0 refills | Status: DC

## 2022-03-14 MED ORDER — LACTULOSE 20 GM/30ML PO SOLN: 30.0000 mL | Freq: Two times a day (BID) | ORAL | 3 refills | Status: DC | PRN

## 2022-03-14 MED ORDER — PREGABALIN 150 MG PO CAPS: 150.0000 mg | ORAL_CAPSULE | Freq: Two times a day (BID) | ORAL | 5 refills | Status: DC

## 2022-03-14 MED ORDER — OXYCODONE HCL 10 MG PO TABS: 10.0000 mg | ORAL_TABLET | Freq: Three times a day (TID) | ORAL | 0 refills | Status: DC | PRN

## 2022-03-14 NOTE — Assessment & Plan Note (Signed)
Cont on oxycodone 10 mg 3 times daily.   Potential benefits of a long term opioids use as well as potential risks (i.e. addiction risk, apnea etc) and complications (i.e. Somnolence, constipation and others) were explained to the patient and were aknowledged. 

## 2022-03-14 NOTE — Assessment & Plan Note (Signed)
Chronic. 

## 2022-03-14 NOTE — Progress Notes (Signed)
Subjective:  Patient ID: Adam Vaughn, male    DOB: [birth year]  Age: 46 y.o. MRN: [id number]  CC: Follow-up (2 month f/u)   HPI Adam Vaughn presents for LBP 7/10, weak legs, GERD C/o CP after drinking water and meals x 2 d Constipation - no stool x 7-10 d  Outpatient Medications Prior to Visit  Medication Sig Dispense Refill   albuterol (PROAIR HFA) 108 (90 Base) MCG/ACT inhaler Inhale 2 puffs into the lungs every 6 (six) hours as needed for wheezing or shortness of breath. 3 each 1   Docusate Calcium (STOOL SOFTENER PO) Take 1 tablet by mouth daily.     famotidine (PEPCID) 40 MG tablet Take 1 tablet (40 mg total) by mouth at bedtime. 90 tablet 3   hyoscyamine (LEVSIN SL) 0.125 MG SL tablet Place 1 tablet (0.125 mg total) under the tongue every 6 (six) hours as needed. 30 tablet 2   loratadine (CLARITIN) 10 MG tablet Take 10 mg by mouth daily as needed for allergies.     methylcellulose (CITRUCEL) oral powder Take 1 packet by mouth daily.     naproxen (NAPROSYN) 500 MG tablet Take 1 tablet (500 mg total) by mouth 2 (two) times daily as needed for moderate pain. 180 tablet 0   ondansetron (ZOFRAN) 4 MG tablet Take 1 tablet (4 mg total) by mouth every 6 (six) hours as needed for nausea or vomiting. 20 tablet 0   oxymetazoline (AFRIN) 0.05 % nasal spray Place 1 spray into both nostrils daily.     RABEprazole (ACIPHEX) 20 MG tablet Take 1 tablet (20 mg total) by mouth in the morning. 90 tablet 3   tiZANidine (ZANAFLEX) 4 MG capsule Take 1 capsule (4 mg total) by mouth 3 (three) times daily. 180 capsule 0   fluticasone (FLONASE) 50 MCG/ACT nasal spray Place 2 sprays into both nostrils daily. 16 g 6   Oxycodone HCl 10 MG TABS Take 1 tablet (10 mg total) by mouth 3 (three) times daily as needed. 90 tablet 0   Oxycodone HCl 10 MG TABS Take 1 tablet (10 mg total) by mouth 3 (three) times daily. Please fill on or after 11/10/21 90 tablet 0   pregabalin (LYRICA) 150 MG capsule Take 1  capsule (150 mg total) by mouth 2 (two) times daily. 60 capsule 5   naloxegol oxalate (MOVANTIK) 25 MG TABS tablet Take 1 tablet (25 mg total) by mouth daily. (Patient not taking: Reported on 03/14/2022) 30 tablet 11   tiZANidine (ZANAFLEX) 4 MG tablet TAKE 1 TABLET BY MOUTH EVERY 6 HOURS AS NEEDED FOR MUSCLE SPASM (Patient not taking: Reported on 03/14/2022) 60 tablet 0   No facility-administered medications prior to visit.    ROS: Review of Systems  Constitutional:  Negative for appetite change, fatigue and unexpected weight change.  HENT:  Negative for congestion, nosebleeds, sneezing, sore throat and trouble swallowing.   Eyes:  Negative for itching and visual disturbance.  Respiratory:  Negative for cough.   Cardiovascular:  Positive for chest pain. Negative for palpitations and leg swelling.  Gastrointestinal:  Negative for abdominal distention, blood in stool, diarrhea and nausea.  Genitourinary:  Negative for frequency and hematuria.  Musculoskeletal:  Positive for back pain and gait problem. Negative for joint swelling and neck pain.  Skin:  Negative for color change and rash.  Neurological:  Negative for dizziness, tremors, speech difficulty and weakness.  Psychiatric/Behavioral:  Negative for agitation, dysphoric mood and sleep disturbance. The patient is not  nervous/anxious.     Objective:  BP 122/74 (BP Location: Left Arm)   Pulse 85   Temp 98.2 F (36.8 C) (Oral)   Ht '5\' 8"'$  (1.727 m)   Wt 190 lb 3.2 oz (86.3 kg)   SpO2 97%   BMI 28.92 kg/m   BP Readings from Last 3 Encounters:  03/14/22 122/74  03/06/22 128/88  01/31/22 (!) 135/95    Wt Readings from Last 3 Encounters:  03/14/22 190 lb 3.2 oz (86.3 kg)  03/06/22 192 lb 8 oz (87.3 kg)  01/18/22 195 lb 9.6 oz (88.7 kg)    Physical Exam Constitutional:      General: He is not in acute distress.    Appearance: Normal appearance. He is well-developed.     Comments: NAD  Eyes:     Conjunctiva/sclera:  Conjunctivae normal.     Pupils: Pupils are equal, round, and reactive to light.  Neck:     Thyroid: No thyromegaly.     Vascular: No JVD.  Cardiovascular:     Rate and Rhythm: Normal rate and regular rhythm.     Heart sounds: Normal heart sounds. No murmur heard.    No friction rub. No gallop.  Pulmonary:     Effort: Pulmonary effort is normal. No respiratory distress.     Breath sounds: Normal breath sounds. No wheezing or rales.  Chest:     Chest wall: No tenderness.  Abdominal:     General: Bowel sounds are normal. There is no distension.     Palpations: Abdomen is soft. There is no mass.     Tenderness: There is no abdominal tenderness. There is no guarding or rebound.  Musculoskeletal:        General: Tenderness present. Normal range of motion.     Cervical back: Normal range of motion.  Lymphadenopathy:     Cervical: No cervical adenopathy.  Skin:    General: Skin is warm and dry.     Findings: No rash.  Neurological:     Mental Status: He is alert and oriented to person, place, and time.     Cranial Nerves: No cranial nerve deficit.     Motor: Weakness present. No abnormal muscle tone.     Coordination: Coordination abnormal.     Gait: Gait abnormal.     Deep Tendon Reflexes: Reflexes are normal and symmetric.  Psychiatric:        Behavior: Behavior normal.        Thought Content: Thought content normal.        Judgment: Judgment normal.  Ataxic and antalgic gait  Lab Results  Component Value Date   WBC 3.8 (L) 02/17/2021   HGB 14.4 02/17/2021   HCT 42.2 02/17/2021   PLT 195.0 02/17/2021   GLUCOSE 95 09/04/2021   ALT 30 09/04/2021   AST 19 09/04/2021   NA 138 09/04/2021   K 4.1 09/04/2021   CL 103 09/04/2021   CREATININE 0.86 09/04/2021   BUN 18 09/04/2021   CO2 26 09/04/2021   TSH 1.26 01/14/2020   PSA 1.83 09/04/2021   HGBA1C 5.5 09/04/2021    DG INJECT DIAG/THERA/INC NEEDLE/CATH/PLC EPI/LUMB/SAC W/IMG  Result Date: 01/31/2022 CLINICAL DATA:   Previous lower lumbar fusion. Back pain and bilateral pain and numbness. FLUOROSCOPY: Radiation Exposure Index (as provided by the fluoroscopic device): 0 minutes 33 seconds. 43.50 micro gray meter squared PROCEDURE: The procedure, risks, benefits, and alternatives were explained to the patient. Questions regarding the procedure were encouraged and answered.  The patient understands and consents to the procedure. LUMBAR EPIDURAL INJECTION: An interlaminar approach was performed on the left at L3-4. The overlying skin was cleansed and anesthetized. A 20 gauge epidural needle was advanced using loss-of-resistance technique. DIAGNOSTIC EPIDURAL INJECTION: Injection of Isovue-M 200 shows a good epidural pattern with spread above and below the level of needle placement, primarily on the left, but to both sides. No vascular opacification is seen. THERAPEUTIC EPIDURAL INJECTION: Eighty mg of Depo-Medrol mixed with 2.5 cc 1% lidocaine were instilled. The procedure was well-tolerated, and the patient was discharged thirty minutes following the injection in good condition. COMPLICATIONS: None IMPRESSION: Technically successful epidural injection on the left L3-4. Electronically Signed   By: Nelson Chimes M.D.   On: 01/31/2022 09:32    Assessment & Plan:   Problem List Items Addressed This Visit     Constipation    Mavantik was too $$$ Will try Lactulose prn      Relevant Orders   TSH   Dyslipidemia - Primary   Relevant Orders   CBC with Differential/Platelet   Urinalysis   GERD (gastroesophageal reflux disease)    On Aciphex and Pepcid F/u w/GI - Dr Ardis Hughs      Relevant Medications   Lactulose 20 GM/30ML SOLN   Other Relevant Orders   CBC with Differential/Platelet   Comprehensive metabolic panel   TSH   Urinalysis   Lipid panel   Insomnia    Chronic       Low back pain    Cont on oxycodone 10 mg 3 times daily.   Potential benefits of a long term opioids use as well as potential risks (i.e.  addiction risk, apnea etc) and complications (i.e. Somnolence, constipation and others) were explained to the patient and were aknowledged.      Relevant Medications   Oxycodone HCl 10 MG TABS   Oxycodone HCl 10 MG TABS   Other Relevant Orders   CBC with Differential/Platelet   Comprehensive metabolic panel   TSH   Urinalysis   Lipid panel   Low vitamin B12 level    On B complex         Meds ordered this encounter  Medications   fluticasone (FLONASE) 50 MCG/ACT nasal spray    Sig: Place 2 sprays into both nostrils daily.    Dispense:  16 g    Refill:  6   Oxycodone HCl 10 MG TABS    Sig: Take 1 tablet (10 mg total) by mouth 3 (three) times daily as needed.    Dispense:  90 tablet    Refill:  0    Please fill on or after 04/12/22   Oxycodone HCl 10 MG TABS    Sig: Take 1 tablet (10 mg total) by mouth 3 (three) times daily. Please fill on or after 03/13/22    Dispense:  90 tablet    Refill:  0    Please fill on or after 02/08/22   pregabalin (LYRICA) 150 MG capsule    Sig: Take 1 capsule (150 mg total) by mouth 2 (two) times daily.    Dispense:  60 capsule    Refill:  5   Lactulose 20 GM/30ML SOLN    Sig: Take 30-60 mLs (20-40 g total) by mouth 2 (two) times daily as needed (severe constipation).    Dispense:  1000 mL    Refill:  3      Follow-up: Return in about 2 months (around 05/14/2022) for a follow-up visit.  Walker Kehr, MD

## 2022-03-14 NOTE — Assessment & Plan Note (Signed)
On B complex 

## 2022-03-14 NOTE — Telephone Encounter (Signed)
It looks like insurance will cover Relistor with prior auth but 45 mg PO is not available. The tablet is 150 mg.

## 2022-03-14 NOTE — Assessment & Plan Note (Signed)
Mavantik was too $$$ Will try Lactulose prn

## 2022-03-14 NOTE — Telephone Encounter (Signed)
Perfect. Relistor 150 mg PO daily. #90, RF3.  Thank you.

## 2022-03-14 NOTE — Assessment & Plan Note (Addendum)
On Aciphex and Pepcid F/u w/GI - Dr Ardis Hughs

## 2022-03-15 NOTE — Telephone Encounter (Signed)
He had an EGD earlier this year by Dr. Ardis Hughs with a normal-appearing esophagus.  Could be that he is feeling some atypical reflux symptoms.  Try increasing rabeprazole to 20 mg twice daily for the next 2 weeks, then reduce back to 20 mg daily.  If symptoms persist, could consider esophageal manometry with pH/impedance (on therapy).

## 2022-03-23 ENCOUNTER — Ambulatory Visit
Admission: RE | Admit: 2022-03-23 | Discharge: 2022-03-23 | Disposition: A | Discharge status: Home / Self Care | Payer: No Typology Code available for payment source | Source: Ambulatory Visit | Attending: Internal Medicine | Admitting: Internal Medicine

## 2022-03-23 DIAGNOSIS — E785 Hyperlipidemia, unspecified: Secondary | ICD-10-CM

## 2022-03-26 ENCOUNTER — Encounter: Payer: Self-pay | Admitting: Internal Medicine

## 2022-03-26 DIAGNOSIS — I2510 Atherosclerotic heart disease of native coronary artery without angina pectoris: Secondary | ICD-10-CM | POA: Insufficient documentation

## 2022-03-27 IMAGING — CT CT HEAD W/O CM
3 of 4 series · 16 of 47 positions shown, 19 images · non-contrast
Comparison: None.

CLINICAL DATA: New daily headache.  Vertigo.

EXAM:
CT HEAD WITHOUT CONTRAST
TECHNIQUE: Contiguous axial images were obtained from the base of the skull
through the vertex without intravenous contrast.

[Series 3: head 2.0 hr68 · axial · 0.42mm/px · z∈[+123,+267]mm · 10 of 80 slices shown, 13 images]
[im 4/80  brain]
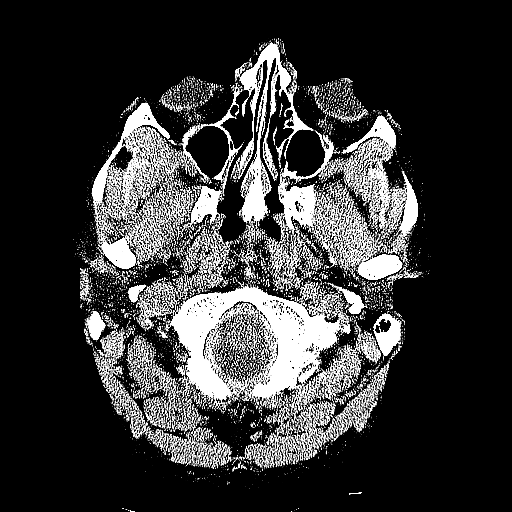
[im 4/80  bone]
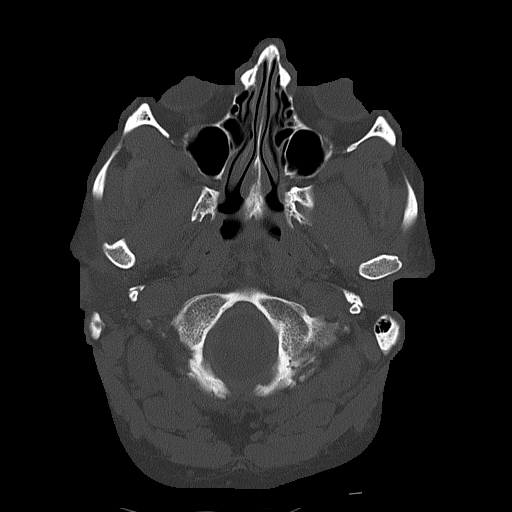
[im 12/80  brain]
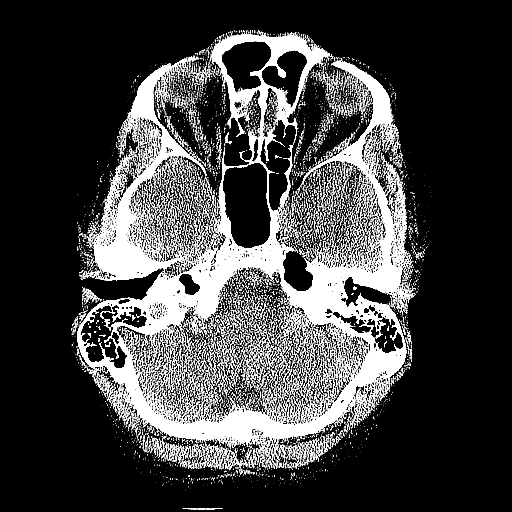
[im 20/80  brain]
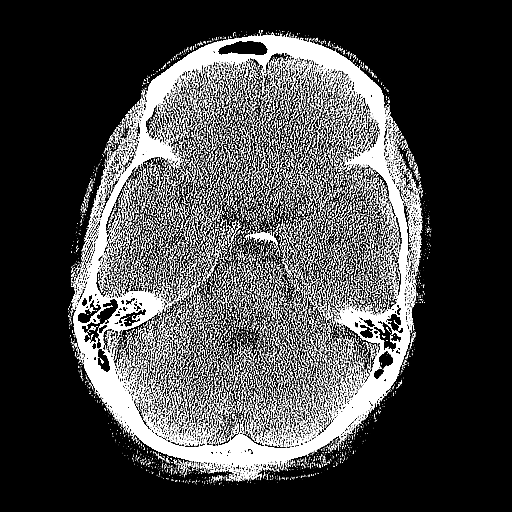
[im 28/80  brain]
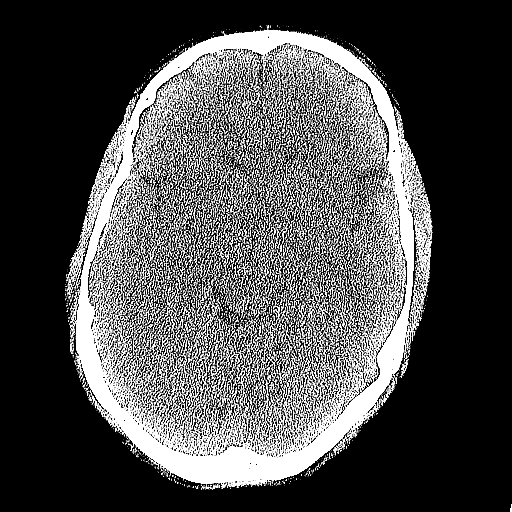
[im 36/80  brain]
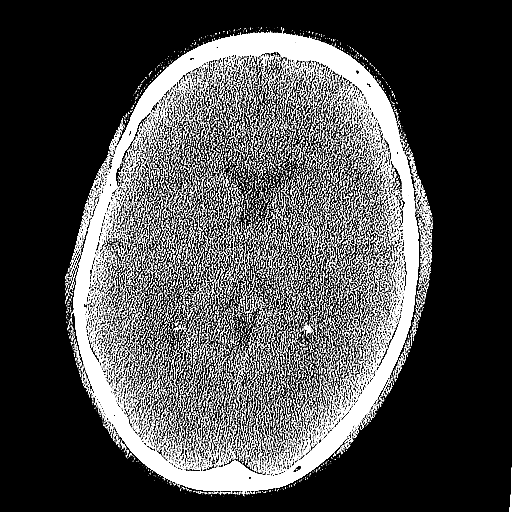
[im 36/80  bone]
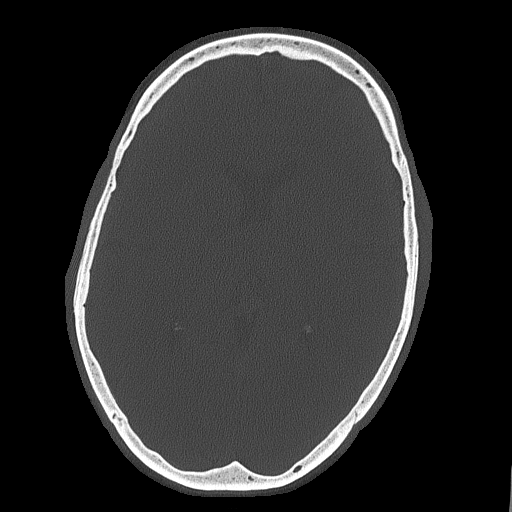
[im 44/80  brain]
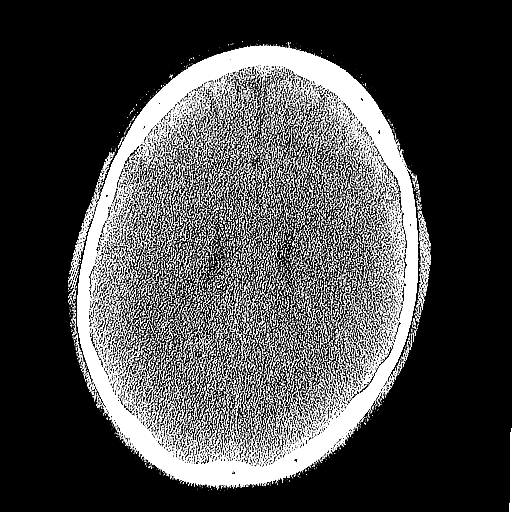
[im 52/80  brain]
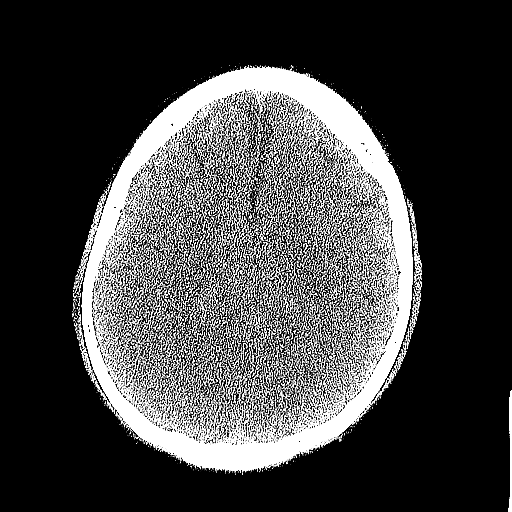
[im 60/80  brain]
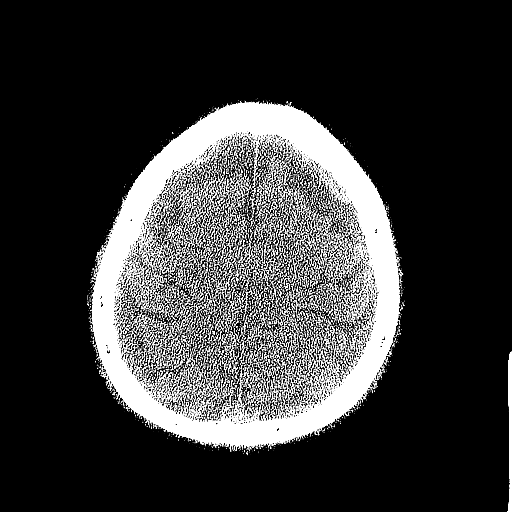
[im 68/80  brain]
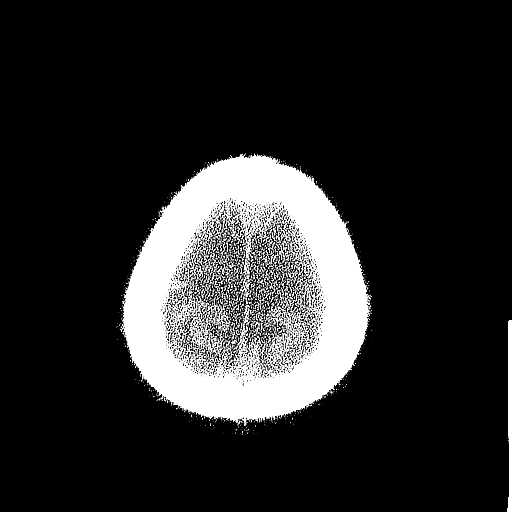
[im 68/80  bone]
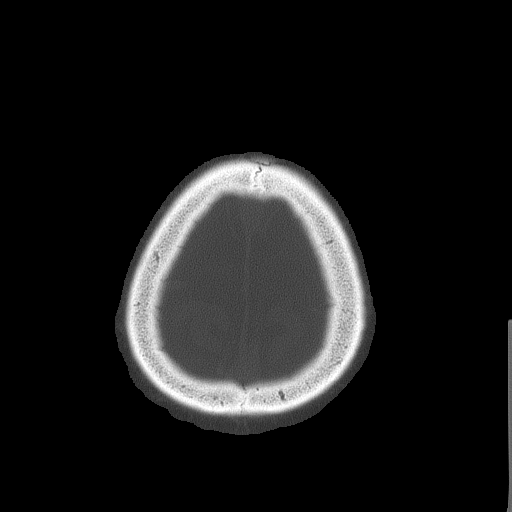
[im 76/80  brain]
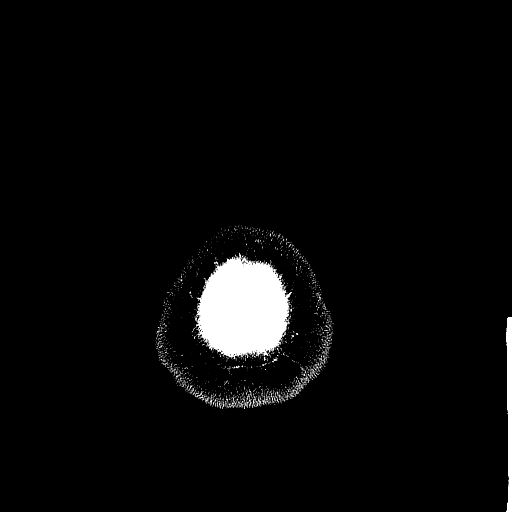

[Series 4: head 3.0 mpr cor · coronal · 0.31mm/px · 3 of 65 slices shown]
[im 22/65  brain]
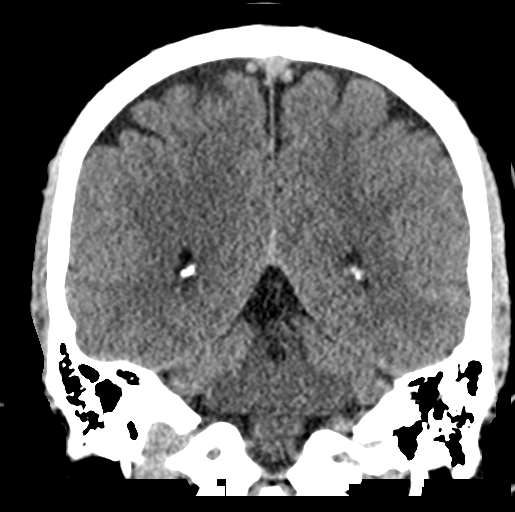
[im 29/65  brain]
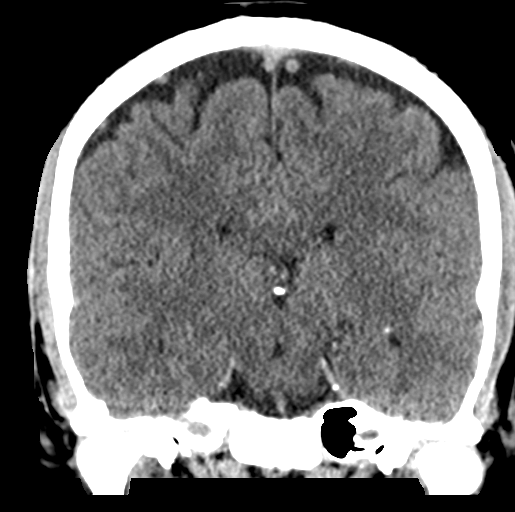
[im 36/65  brain]
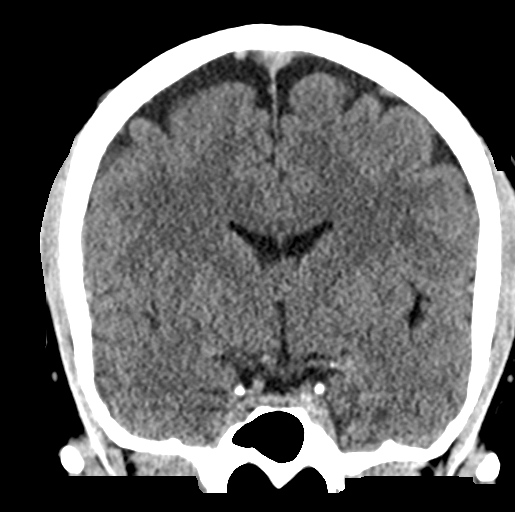

[Series 5: head 3.0 mpr sag · sagittal · 0.30mm/px · 3 of 56 slices shown]
[im 19/56  brain]
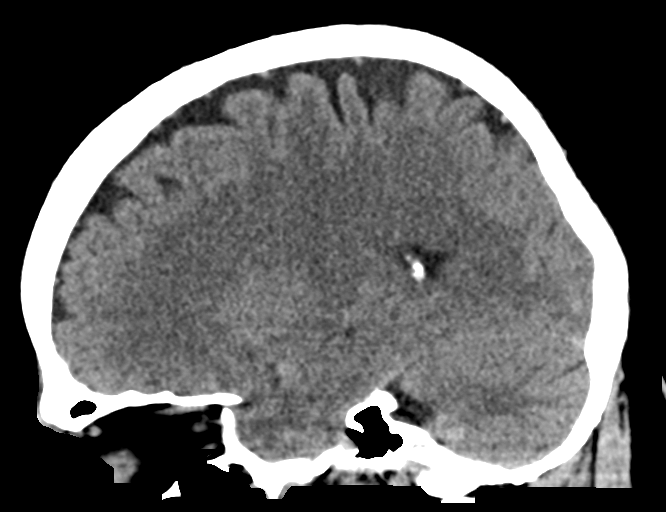
[im 28/56  brain]
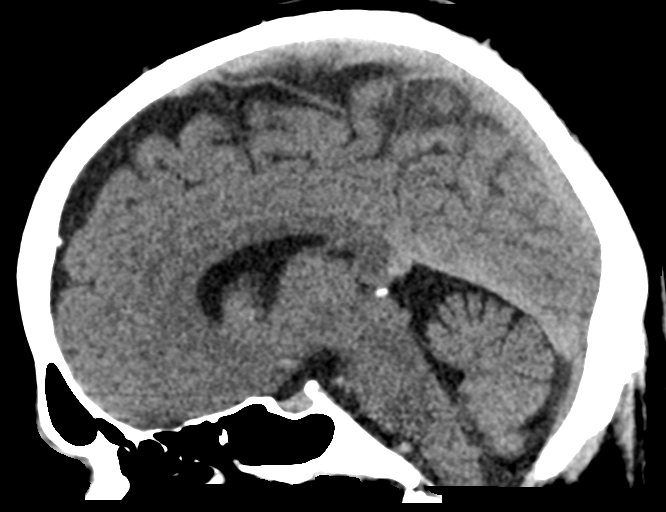
[im 37/56  brain]
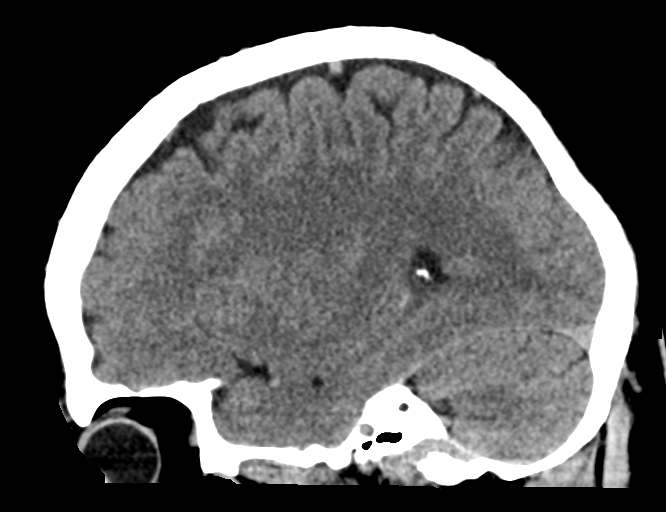

[16 of 47 positions shown; findings below may reference images not displayed]

FINDINGS: Brain: No evidence of acute infarction, hemorrhage, hydrocephalus,
extra-axial collection or mass lesion/mass effect.

Vascular: No hyperdense vessel or unexpected calcification.

Skull: Normal. Negative for fracture or focal lesion.

Sinuses/Orbits: No acute finding.
IMPRESSION: Negative head CT.

## 2022-03-28 ENCOUNTER — Ambulatory Visit (INDEPENDENT_AMBULATORY_CARE_PROVIDER_SITE_OTHER): Payer: Commercial Managed Care - HMO

## 2022-03-28 ENCOUNTER — Encounter: Payer: Self-pay | Admitting: Orthopedic Surgery

## 2022-03-28 ENCOUNTER — Ambulatory Visit: Payer: Commercial Managed Care - HMO | Admitting: Orthopedic Surgery

## 2022-03-28 VITALS — BP 131/80 | HR 95 | Ht 68.0 in | Wt 190.2 lb

## 2022-03-28 DIAGNOSIS — M5416 Radiculopathy, lumbar region: Secondary | ICD-10-CM | POA: Diagnosis not present

## 2022-03-28 DIAGNOSIS — M4714 Other spondylosis with myelopathy, thoracic region: Secondary | ICD-10-CM | POA: Diagnosis not present

## 2022-03-28 DIAGNOSIS — M545 Low back pain, unspecified: Secondary | ICD-10-CM

## 2022-03-29 NOTE — Progress Notes (Signed)
Orthopedic Spine Surgery Office Note  Patient name: Adam Vaughn Patient MRN: [id number] Date of visit: 03/28/2022  History:  Patient is a 46 year old male who presents today for lumbar spine Pain location: lower back and goes into the lateral aspect of his bilateral hip and thighs, infrequently extends past his knee on either side. Also, feels it radiates into his testicles Severity of pain: severe, interferes with his daily life Mechanism of injury: none, insidious onset Duration of symptoms: back has hurt him for 20 years, legs for the last couple of years Radicular symptoms: yes, feels pain radiating to his testicles and his lateral thighs Weakness: denies  Symptoms of imbalance: reports he feels of balance frequently when walking Has not noticed any hand clumsiness or dexterity issues Paresthesias and numbness: yes - paresthesias in bilateral feet, no other paresthesias or numbness Bowel or bladder incontinence: denies Saddle anesthesia: denies Symptoms:  -leg cramping: denies  -leg heaviness: denies  -leg pain: yes, as above  Treatments tried: injection (helped), oxycodone (helps), PT, icy hot, naproxen, lyrica, tizanidine  Review of systems: Denies fevers and chills, night sweats, unexplained weight loss, history of cancer  Past medical history: CAD Allergic rhinitis Bronchitis IBS GERD Vit D deficiency Chronic pain  Allergies: cymbalta, gabapentin  Past surgical history:  Cholecystectomy Lumbar spine surgery 2021  Social history: Denies use of nicotine product (smoking, vaping, patches, smokeless) Denies recreational drug use   Physical Exam:  General: no acute distress, appears stated age Neurologic: alert, answering questions appropriately, following commands Respiratory: unlabored breathing on room air, symmetric chest rise Psychiatric: appropriate affect, normal cadence to speech   MSK (spine):  -Strength exam      Left  Right EHL     4/5  4/5 TA    5/5  5/5 GSC    5/5  5/5 Knee extension  5/5  5/5 Hip flexion   4/5  4/5  -Sensory exam    Sensation intact to light touch in L3-S1 nerve distributions of bilateral lower extremities  -Patellar tendon DTR: 3/4 on the left, 2/4 on the right  -Straight leg raise: negative -Contralateral straight leg raise:negative -Femoral nerve stretch test: negative -Had pain with internal rotation in bilateral hips but not external and was tender to palpation along his SI joints (particularly the left). Negative FABER bilaterally.  -Imbalance with tandem gait and toe walking, positive romberg, no beats of clonus on either side  -Negative hoffman bilaterally, 2+ biceps reflexes, negative inverted brachialioradialis reflex, no interosseus wasting, negative grip and release test  Imaging: XR of lumbar spine from 03/28/2022 was independently reviewed and interpreted, showing instrumentation and interbody devices from L4-S1. There no lucencies around any of the fixation. No evidence of instrumentation complication. No evidence of instability on flexion/extension.   XR of the pelvis from 03/28/2022 was independently reviewed and interpreted, showing AP view of prior instrumentation as mentioned above with no evidence of complication on this view. No significant degenerative changes within the hip joint. No acute osseous abnormality.    Assessment: Patient is a 46 year old male with radicular symptoms (testicular sometimes if from L5 but his thigh is more of a L3 distribution) and physical exam findings concerning for myelopathy   Plan: -Patient symptoms concerning for lumbar radiculopathy but also has symptoms and exam findings concerning for myelopathy. Since he has no hand symptoms or signs, recommended a thoracolumbar MRI which was ordered today -He can continue with the medications he has already been prescribed -Patient expressed  understanding and agreement with the plan. All his  questions were answered to his satisfaction -Patient should return to the office in about 2 months to discuss the results of the MRI, XR needed at the next visit: none  Ileene Rubens, MD Orthopedic Surgeon

## 2022-03-30 ENCOUNTER — Other Ambulatory Visit: Payer: Self-pay

## 2022-03-30 DIAGNOSIS — K219 Gastro-esophageal reflux disease without esophagitis: Secondary | ICD-10-CM

## 2022-04-02 ENCOUNTER — Other Ambulatory Visit: Payer: Self-pay

## 2022-04-02 MED ORDER — ONDANSETRON HCL 4 MG PO TABS: 4.0000 mg | ORAL_TABLET | Freq: Four times a day (QID) | ORAL | 2 refills | Status: DC | PRN

## 2022-04-02 NOTE — Telephone Encounter (Signed)
Pt scheduled for EM with pH impedance on 06/13/22 at 8:30 am. Spoke with pt and let pt know date and time of EM and pH impedance testing. Pt verbalized understanding and wanted to know if he could go back to esomeprazole instead of Aciphex because he felt like his symptoms were better controlled on esomeprazole. Let pt know he could go back to previous prescription today and I would send message to Dr. Bryan Lemma.

## 2022-04-03 ENCOUNTER — Other Ambulatory Visit: Payer: Self-pay

## 2022-04-03 ENCOUNTER — Telehealth: Payer: Self-pay

## 2022-04-03 MED ORDER — ESOMEPRAZOLE MAGNESIUM 40 MG PO CPDR: 40.0000 mg | DELAYED_RELEASE_CAPSULE | Freq: Two times a day (BID) | ORAL | 6 refills | Status: DC

## 2022-04-03 NOTE — Telephone Encounter (Signed)
Patient is returning your call.  

## 2022-04-03 NOTE — Telephone Encounter (Signed)
Left message for pt to call back  °

## 2022-04-03 NOTE — Telephone Encounter (Signed)
Left voicemail. See 9/12 mychart message conversation.

## 2022-04-11 ENCOUNTER — Encounter: Payer: Self-pay | Admitting: Family Medicine

## 2022-04-17 ENCOUNTER — Ambulatory Visit: Payer: Commercial Managed Care - HMO | Admitting: Family Medicine

## 2022-04-17 ENCOUNTER — Ambulatory Visit: Payer: Self-pay

## 2022-04-17 VITALS — BP 158/102 | HR 92 | Ht 68.0 in | Wt 198.0 lb

## 2022-04-17 DIAGNOSIS — M25561 Pain in right knee: Secondary | ICD-10-CM

## 2022-04-17 DIAGNOSIS — M25562 Pain in left knee: Secondary | ICD-10-CM

## 2022-04-17 DIAGNOSIS — G8929 Other chronic pain: Secondary | ICD-10-CM

## 2022-04-17 DIAGNOSIS — M5416 Radiculopathy, lumbar region: Secondary | ICD-10-CM

## 2022-04-17 NOTE — Progress Notes (Signed)
I, Adam Vaughn, LAT, ATC acting as a scribe for Adam Leader, MD.  Adam Vaughn is a 46 y.o. male who presents to [place] at [facility] today for continued bilateral knee pain.  Patient was last seen by Dr. Georgina Snell on 01/18/2022 and the visit was filled gust on his lumbar radiculopathy and an ESI was ordered.  Patient had bilat knee steroid injections on 10/19/2021 and completed the Gelsyn series bilaterally, on 08/14/2021.  Today, patient reports prior ESI provided much relief. Pt only got a bit of relief from prior bilat knee steroid injections. Pt locates pain to the anterior aspect of both knees.  He has a repeat lumbar spine MRI scheduled for November 20.  He has been seeing a spine specialist at [facility]. He does note pain occurs in the anterior bilateral knees with knee flexion and standing from a seated position.  Dx imaging: 11/21/2021 left knee x-ray  11/03/2020 bilat knee standing x-ray 06/28/20 R and L knee XR  Pertinent review of systems: No fevers or chills  Relevant historical information: Lumbar radiculopathy and prior back surgery.   Exam:  BP (!) 158/102   Pulse 92   Ht '5\' 8"'$  (1.727 m)   Wt 198 lb (89.8 kg)   SpO2 98%   BMI 30.11 kg/m  General: Well Developed, well nourished, and in no acute distress.   MSK: Bilateral knees normal-appearing normal motion with crepitation.  Nontender palpation.  Pain with resisted knee extension. Intact strength.  Stable ligamentous exam.    Lab and Radiology Results  Procedure: Real-time Ultrasound Guided Injection of right knee superior lateral patellar space Device: Philips Affiniti 50G Images permanently stored and available for review in PACS Verbal informed consent obtained.  Discussed risks and benefits of procedure. Warned about infection, bleeding, hyperglycemia damage to structures among others. Patient expresses understanding and agreement Time-out conducted.   Noted no overlying  erythema, induration, or other signs of local infection.   Skin prepped in a sterile fashion.   Local anesthesia: Topical Ethyl chloride.   With sterile technique and under real time ultrasound guidance: 40 mg of Kenalog and 2 mL of Marcaine injected into knee joint. Fluid seen entering the joint capsule.   Completed without difficulty   Pain immediately resolved suggesting accurate placement of the medication.   Advised to call if fevers/chills, erythema, induration, drainage, or persistent bleeding.   Images permanently stored and available for review in the ultrasound unit.  Impression: Technically successful ultrasound guided injection.    Procedure: Real-time Ultrasound Guided Injection of left knee superior lateral patellar space Device: Philips Affiniti 50G Images permanently stored and available for review in PACS Verbal informed consent obtained.  Discussed risks and benefits of procedure. Warned about infection, bleeding, hyperglycemia damage to structures among others. Patient expresses understanding and agreement Time-out conducted.   Noted no overlying erythema, induration, or other signs of local infection.   Skin prepped in a sterile fashion.   Local anesthesia: Topical Ethyl chloride.   With sterile technique and under real time ultrasound guidance: 40 mg of Kenalog and 2 mL of Marcaine injected into the knee joint. Fluid seen entering the joint capsule.   Completed without difficulty   Pain immediately resolved suggesting accurate placement of the medication.   Advised to call if fevers/chills, erythema, induration, drainage, or persistent bleeding.   Images permanently stored and available for review in the ultrasound unit.  Impression: Technically successful ultrasound guided injection.  Assessment and Plan: 46 y.o. male with bilateral knee pain.  Pain is thought to be multifactorial.  Some of the pain is due to intra-articular cause of the pain such as  patellofemoral pain syndrome or knee DJD changes not well seen on x-ray.  Plan for repeat steroid injection today.    However I think it is likely that a fair amount of his pain is due to lumbar radiculopathy specifically at L4.  He had good relief with prior epidural steroid injection in August.  He has a repeat lumbar spine MRI coming up on November 20.  If the above knee injections do not work well repeat epidural steroid injection following that lumbar spine MRI would probably be helpful.   PDMP not reviewed this encounter. Orders Placed This Encounter  Procedures   [place] LIMITED JOINT SPACE STRUCTURES LOW BILAT(NO LINKED CHARGES)    Order Specific Question:   Reason for Exam (SYMPTOM  OR DIAGNOSIS REQUIRED)    Answer:   bilateral knee pain    Order Specific Question:   Preferred imaging location?    Answer:   [facility]   No orders of the defined types were placed in this encounter.    Discussed warning signs or symptoms. Please see discharge instructions. Patient expresses understanding.   The above documentation has been reviewed and is accurate and complete Adam Vaughn, M.D.

## 2022-04-17 NOTE — Patient Instructions (Addendum)
Thank you for coming in today.   You received an injection today. Seek immediate medical attention if the joint becomes red, extremely painful, or is oozing fluid.   Proceed to MRI in November. I think its like you will get back injections.  Let me know.   Recheck with me as needed.

## 2022-04-23 ENCOUNTER — Other Ambulatory Visit: Payer: Commercial Managed Care - HMO

## 2022-04-30 ENCOUNTER — Ambulatory Visit: Payer: Commercial Managed Care - HMO | Admitting: Podiatry

## 2022-04-30 ENCOUNTER — Encounter: Payer: Self-pay | Admitting: Podiatry

## 2022-04-30 ENCOUNTER — Ambulatory Visit (INDEPENDENT_AMBULATORY_CARE_PROVIDER_SITE_OTHER): Payer: Commercial Managed Care - HMO

## 2022-04-30 DIAGNOSIS — M779 Enthesopathy, unspecified: Secondary | ICD-10-CM

## 2022-04-30 DIAGNOSIS — M7751 Other enthesopathy of right foot: Secondary | ICD-10-CM

## 2022-04-30 DIAGNOSIS — M7661 Achilles tendinitis, right leg: Secondary | ICD-10-CM

## 2022-04-30 MED ORDER — TRIAMCINOLONE ACETONIDE 10 MG/ML IJ SUSP
10.0000 mg | Freq: Once | INTRAMUSCULAR | Status: AC
  Administered 2022-04-30: 10 mg

## 2022-05-01 NOTE — Progress Notes (Signed)
Subjective:   Patient ID: Adam Vaughn, male   DOB: 46 y.o.   MRN: [id number]   HPI Patient presents stating he is a lot of pain in his right ankle and its been hard to walk on.  Does not remember specific injury and has had trouble with it over the years but it is gotten worse over the last few months.  Patient is active and has to jump out of a truck at different times patient does not smoke likes to be active   Review of Systems  All other systems reviewed and are negative.       Objective:  Physical Exam Vitals and nursing note reviewed.  Constitutional:      Appearance: He is well-developed.  Pulmonary:     Effort: Pulmonary effort is normal.  Musculoskeletal:        General: Normal range of motion.  Skin:    General: Skin is warm.  Neurological:     Mental Status: He is alert.     Neurovascular status intact muscle strength found to be adequate range of motion within normal limits.  Patient is noted to have inflammation pain of the ankle right with fluid buildup within the subtalar joint and has mild discomfort into the outside of the foot.  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  Discussed different treatment options including possibility for MRI in future but at this point went ahead did sterile prep and injected the capsule 3 mg Kenalog 5 mg Xylocaine directly into the sinus tarsi and the lateral ankle.  Reappoint for [place] to recheck  X-rays indicate spur formation no indications of advanced arthritis or other pathology noted with moderate flatfoot deformity

## 2022-05-04 ENCOUNTER — Encounter: Payer: Self-pay | Admitting: Podiatry

## 2022-05-07 ENCOUNTER — Encounter: Payer: Self-pay | Admitting: Internal Medicine

## 2022-05-14 ENCOUNTER — Encounter: Payer: Self-pay | Admitting: Internal Medicine

## 2022-05-14 ENCOUNTER — Ambulatory Visit (INDEPENDENT_AMBULATORY_CARE_PROVIDER_SITE_OTHER): Payer: Commercial Managed Care - HMO | Admitting: Internal Medicine

## 2022-05-14 VITALS — BP 126/82 | HR 75 | Temp 98.7°F | Ht 68.0 in | Wt 194.0 lb

## 2022-05-14 DIAGNOSIS — R7989 Other specified abnormal findings of blood chemistry: Secondary | ICD-10-CM | POA: Diagnosis not present

## 2022-05-14 DIAGNOSIS — E559 Vitamin D deficiency, unspecified: Secondary | ICD-10-CM | POA: Diagnosis not present

## 2022-05-14 DIAGNOSIS — G8929 Other chronic pain: Secondary | ICD-10-CM

## 2022-05-14 DIAGNOSIS — W19XXXA Unspecified fall, initial encounter: Secondary | ICD-10-CM

## 2022-05-14 DIAGNOSIS — M545 Low back pain, unspecified: Secondary | ICD-10-CM | POA: Diagnosis not present

## 2022-05-14 DIAGNOSIS — F4321 Adjustment disorder with depressed mood: Secondary | ICD-10-CM | POA: Insufficient documentation

## 2022-05-14 MED ORDER — OXYCODONE HCL 10 MG PO TABS: 10.0000 mg | ORAL_TABLET | Freq: Three times a day (TID) | ORAL | 0 refills | Status: DC | PRN

## 2022-05-14 MED ORDER — OXYCODONE HCL 10 MG PO TABS: 10.0000 mg | ORAL_TABLET | Freq: Three times a day (TID) | ORAL | 0 refills | Status: DC

## 2022-05-14 MED ORDER — ONDANSETRON HCL 4 MG PO TABS: 4.0000 mg | ORAL_TABLET | Freq: Three times a day (TID) | ORAL | 1 refills | Status: DC | PRN

## 2022-05-14 NOTE — Assessment & Plan Note (Signed)
Cymbalta intolerant Options discussed He will try Magnesium supplement

## 2022-05-14 NOTE — Progress Notes (Signed)
Subjective:  Patient ID: Adam Vaughn, male    DOB: 1976/07/01  Age: 46 y.o. MRN: [id number]  CC: Follow-up   HPI Adam Vaughn presents for LBP, weak legs, depessed mood  Outpatient Medications Prior to Visit  Medication Sig Dispense Refill   albuterol (PROAIR HFA) 108 (90 Base) MCG/ACT inhaler Inhale 2 puffs into the lungs every 6 (six) hours as needed for wheezing or shortness of breath. 3 each 1   Docusate Calcium (STOOL SOFTENER PO) Take 1 tablet by mouth daily.     esomeprazole (NEXIUM) 40 MG capsule Take 1 capsule (40 mg total) by mouth 2 (two) times daily before a meal. 60 capsule 6   famotidine (PEPCID) 40 MG tablet Take 1 tablet (40 mg total) by mouth at bedtime. 90 tablet 3   fluticasone (FLONASE) 50 MCG/ACT nasal spray Place 2 sprays into both nostrils daily. 16 g 6   hyoscyamine (LEVSIN SL) 0.125 MG SL tablet Place 1 tablet (0.125 mg total) under the tongue every 6 (six) hours as needed. 30 tablet 2   Lactulose 20 GM/30ML SOLN Take 30-60 mLs (20-40 g total) by mouth 2 (two) times daily as needed (severe constipation). 1000 mL 3   loratadine (CLARITIN) 10 MG tablet Take 10 mg by mouth daily as needed for allergies.     methylcellulose (CITRUCEL) oral powder Take 1 packet by mouth daily.     naproxen (NAPROSYN) 500 MG tablet Take 1 tablet (500 mg total) by mouth 2 (two) times daily as needed for moderate pain. 180 tablet 0   oxymetazoline (AFRIN) 0.05 % nasal spray Place 1 spray into both nostrils daily.     pregabalin (LYRICA) 150 MG capsule Take 1 capsule (150 mg total) by mouth 2 (two) times daily. 60 capsule 5   tiZANidine (ZANAFLEX) 4 MG capsule Take 1 capsule (4 mg total) by mouth 3 (three) times daily. 180 capsule 0   ondansetron (ZOFRAN) 4 MG tablet Take 1 tablet (4 mg total) by mouth every 6 (six) hours as needed for nausea or vomiting. 30 tablet 2   Oxycodone HCl 10 MG TABS Take 1 tablet (10 mg total) by mouth 3 (three) times daily as needed. 90 tablet 0    Oxycodone HCl 10 MG TABS Take 1 tablet (10 mg total) by mouth 3 (three) times daily. Please fill on or after 03/13/22 90 tablet 0   Methylnaltrexone Bromide (RELISTOR) 150 MG TABS Take 150 mg by mouth daily. (Patient not taking: Reported on 05/14/2022) 90 tablet 3   No facility-administered medications prior to visit.    ROS: Review of Systems  Constitutional:  Positive for unexpected weight change. Negative for appetite change and fatigue.  HENT:  Negative for congestion, nosebleeds, sneezing, sore throat and trouble swallowing.   Eyes:  Negative for itching and visual disturbance.  Respiratory:  Negative for cough.   Cardiovascular:  Negative for chest pain, palpitations and leg swelling.  Gastrointestinal:  Negative for abdominal distention, blood in stool, diarrhea and nausea.  Genitourinary:  Negative for frequency and hematuria.  Musculoskeletal:  Positive for back pain. Negative for gait problem, joint swelling and neck pain.  Skin:  Negative for color change, rash and wound.  Neurological:  Negative for dizziness, tremors, speech difficulty and weakness.  Psychiatric/Behavioral:  Positive for dysphoric mood. Negative for agitation, sleep disturbance and suicidal ideas. The patient is not nervous/anxious.     Objective:  BP 126/82 (BP Location: Left Arm, Patient Position: Sitting, Cuff Size: Normal)  Pulse 75   Temp 98.7 F (37.1 C) (Oral)   Ht '5\' 8"'$  (1.727 m)   Wt 194 lb (88 kg)   SpO2 99%   BMI 29.50 kg/m   BP Readings from Last 3 Encounters:  05/14/22 126/82  04/17/22 (!) 158/102  03/28/22 131/80    Wt Readings from Last 3 Encounters:  05/14/22 194 lb (88 kg)  04/17/22 198 lb (89.8 kg)  03/28/22 190 lb 3.2 oz (86.3 kg)    Physical Exam Constitutional:      General: He is not in acute distress.    Appearance: Normal appearance. He is well-developed.     Comments: NAD  Eyes:     Conjunctiva/sclera: Conjunctivae normal.     Pupils: Pupils are equal, round,  and reactive to light.  Neck:     Thyroid: No thyromegaly.     Vascular: No JVD.  Cardiovascular:     Rate and Rhythm: Normal rate and regular rhythm.     Heart sounds: Normal heart sounds. No murmur heard.    No friction rub. No gallop.  Pulmonary:     Effort: Pulmonary effort is normal. No respiratory distress.     Breath sounds: Normal breath sounds. No wheezing or rales.  Chest:     Chest wall: No tenderness.  Abdominal:     General: Bowel sounds are normal. There is no distension.     Palpations: Abdomen is soft. There is no mass.     Tenderness: There is no abdominal tenderness. There is no guarding or rebound.  Musculoskeletal:        General: Tenderness present. Normal range of motion.     Cervical back: Normal range of motion.  Lymphadenopathy:     Cervical: No cervical adenopathy.  Skin:    General: Skin is warm and dry.     Findings: No rash.  Neurological:     Mental Status: He is alert and oriented to person, place, and time.     Cranial Nerves: No cranial nerve deficit.     Motor: Weakness present. No abnormal muscle tone.     Coordination: Coordination normal.     Gait: Gait abnormal.     Deep Tendon Reflexes: Reflexes are normal and symmetric.  Psychiatric:        Behavior: Behavior normal.        Thought Content: Thought content normal.        Judgment: Judgment normal.   LS w/pain Weak legs  Lab Results  Component Value Date   WBC 8.2 03/14/2022   HGB 15.0 03/14/2022   HCT 44.2 03/14/2022   PLT 257.0 03/14/2022   GLUCOSE 88 03/14/2022   CHOL 171 03/14/2022   TRIG 165.0 (H) 03/14/2022   HDL 50.20 03/14/2022   LDLCALC 88 03/14/2022   ALT 31 03/14/2022   AST 19 03/14/2022   NA 139 03/14/2022   K 4.0 03/14/2022   CL 105 03/14/2022   CREATININE 0.93 03/14/2022   BUN 13 03/14/2022   CO2 27 03/14/2022   TSH 0.65 03/14/2022   PSA 1.83 09/04/2021   HGBA1C 5.5 09/04/2021    CT CARDIAC SCORING ([facility] LOCATIONS ONLY)  Result Date:  03/25/2022 CLINICAL DATA:  Dyslipidemia * Tracking Code: FCC * EXAM: CT CARDIAC CORONARY ARTERY CALCIUM SCORE TECHNIQUE: Non-contrast imaging through the heart was performed using prospective ECG gating. Image post processing was performed on an independent workstation, allowing for quantitative analysis of the heart and coronary arteries. Note that this exam targets the  heart and the chest was not imaged in its entirety. COMPARISON:  None available. FINDINGS: CORONARY CALCIUM SCORES: Left Main: 0 LAD: 6 LCx: 0 RCA: 0 Total Agatston Score: 6 MESA database percentile: 77 AORTA MEASUREMENTS: Ascending Aorta: 30 cm Descending Aorta:25 cm OTHER FINDINGS: Heart is normal size. Aorta normal caliber. Calcification in the aortic arch. No adenopathy. No confluent opacities or effusions. No acute findings in the upper abdomen. Chest wall soft tissues are unremarkable. No acute bony abnormality. IMPRESSION: Total Agatston score: 6 [place] database percentile: 77 Punctate calcification in the aortic arch. No acute extra cardiac abnormality. Electronically Signed   By: Rolm Baptise M.D.   On: 03/25/2022 20:04    Assessment & Plan:   Problem List Items Addressed This Visit     Low back pain    Cont on oxycodone 10 mg 3 times daily.   Potential benefits of a long term opioids use as well as potential risks (i.e. addiction risk, apnea etc) and complications (i.e. Somnolence, constipation and others) were explained to the patient and were aknowledged.      Relevant Medications   Oxycodone HCl 10 MG TABS   Oxycodone HCl 10 MG TABS   Vitamin D deficiency    On Vit D      Low vitamin B12 level - Primary    On B12      Falls    Recurrent Discussed      Situational depression    Cymbalta intolerant Options discussed He will try Magnesium supplement         Meds ordered this encounter  Medications   Oxycodone HCl 10 MG TABS    Sig: Take 1 tablet (10 mg total) by mouth 3 (three) times daily as needed.     Dispense:  90 tablet    Refill:  0    Please fill on or after 05/12/22   Oxycodone HCl 10 MG TABS    Sig: Take 1 tablet (10 mg total) by mouth 3 (three) times daily. Please fill on or after 03/13/22    Dispense:  90 tablet    Refill:  0    Please fill on or after 06/11/22   ondansetron (ZOFRAN) 4 MG tablet    Sig: Take 1 tablet (4 mg total) by mouth every 8 (eight) hours as needed for nausea or vomiting.    Dispense:  20 tablet    Refill:  1      Follow-up: Return in about 2 months (around 07/14/2022) for a follow-up visit.   Walker Kehr, MD

## 2022-05-14 NOTE — Assessment & Plan Note (Signed)
On Vit D 

## 2022-05-14 NOTE — Assessment & Plan Note (Signed)
Cont on oxycodone 10 mg 3 times daily.   Potential benefits of a long term opioids use as well as potential risks (i.e. addiction risk, apnea etc) and complications (i.e. Somnolence, constipation and others) were explained to the patient and were aknowledged.

## 2022-05-14 NOTE — Assessment & Plan Note (Signed)
On B12 

## 2022-05-14 NOTE — Assessment & Plan Note (Signed)
Recurrent Discussed

## 2022-05-16 DIAGNOSIS — J343 Hypertrophy of nasal turbinates: Secondary | ICD-10-CM | POA: Insufficient documentation

## 2022-05-16 DIAGNOSIS — H608X3 Other otitis externa, bilateral: Secondary | ICD-10-CM | POA: Insufficient documentation

## 2022-05-21 ENCOUNTER — Ambulatory Visit
Admission: RE | Admit: 2022-05-21 | Discharge: 2022-05-21 | Disposition: A | Discharge status: Home / Self Care | Payer: Commercial Managed Care - HMO | Source: Ambulatory Visit | Attending: Orthopedic Surgery | Admitting: Orthopedic Surgery

## 2022-05-21 DIAGNOSIS — M4714 Other spondylosis with myelopathy, thoracic region: Secondary | ICD-10-CM

## 2022-05-21 DIAGNOSIS — M5416 Radiculopathy, lumbar region: Secondary | ICD-10-CM

## 2022-05-23 ENCOUNTER — Encounter: Payer: Self-pay | Admitting: Orthopedic Surgery

## 2022-05-28 ENCOUNTER — Ambulatory Visit: Payer: Commercial Managed Care - HMO | Admitting: Orthopedic Surgery

## 2022-05-31 ENCOUNTER — Encounter: Payer: Self-pay | Admitting: Gastroenterology

## 2022-06-01 NOTE — Telephone Encounter (Signed)
Very happy to hear that his symptoms have responded well to medications.  No, I think is perfectly reasonable to cancel the studies since he has had an appropriate response to medications.  Thank you for letting us know.

## 2022-06-06 ENCOUNTER — Ambulatory Visit: Payer: Commercial Managed Care - HMO | Admitting: Orthopedic Surgery

## 2022-06-06 ENCOUNTER — Ambulatory Visit (INDEPENDENT_AMBULATORY_CARE_PROVIDER_SITE_OTHER): Payer: Commercial Managed Care - HMO | Admitting: Podiatry

## 2022-06-06 DIAGNOSIS — M7661 Achilles tendinitis, right leg: Secondary | ICD-10-CM

## 2022-06-06 MED ORDER — TRIAMCINOLONE ACETONIDE 10 MG/ML IJ SUSP
10.0000 mg | Freq: Once | INTRAMUSCULAR | Status: AC
  Administered 2022-06-06: 10 mg

## 2022-06-06 NOTE — Patient Instructions (Signed)

## 2022-06-07 NOTE — Progress Notes (Signed)
Subjective:   Patient ID: Adam Vaughn, male   DOB: 46 y.o.   MRN: [id number]   HPI Patient states the side seems to be doing pretty good but he is getting a lot of discomfort in the back of his right heel and states that it has been hard to walk with that he needs to try to be active   ROS      Objective:  Physical Exam  Neurovascular status intact with inflammation pain of the posterior lateral aspect of the Achilles tendon insertion right with no central or medial involvement with strong muscle strength noted     Assessment:  Achilles tendinitis acute right with inflammation pain lateral side     Plan:  Reviewed condition I recommended a very careful injection explaining chances of rupture to him and I went ahead today and I also recommended complete immobilization due to the pain.  Patient wants to wants this approach understanding risk and today I did sterile prep and injected the lateral side of the Achilles not central or medial 3 mg dexamethasone Kenalog 5 mg Xylocaine and applied a air fracture walker to completely immobilize the leg.  Patient will be seen back to recheck

## 2022-06-13 ENCOUNTER — Encounter (HOSPITAL_COMMUNITY): Payer: Self-pay

## 2022-06-13 ENCOUNTER — Ambulatory Visit (HOSPITAL_COMMUNITY): Admit: 2022-06-13 | Payer: Commercial Managed Care - HMO | Admitting: Gastroenterology

## 2022-06-13 SURGERY — MANOMETRY, ESOPHAGUS
Anesthesia: Choice

## 2022-06-14 ENCOUNTER — Ambulatory Visit: Payer: Commercial Managed Care - HMO | Admitting: Orthopedic Surgery

## 2022-06-18 ENCOUNTER — Ambulatory Visit (INDEPENDENT_AMBULATORY_CARE_PROVIDER_SITE_OTHER): Payer: Commercial Managed Care - HMO | Admitting: Orthopedic Surgery

## 2022-06-18 ENCOUNTER — Encounter: Payer: Self-pay | Admitting: Neurology

## 2022-06-18 DIAGNOSIS — M48061 Spinal stenosis, lumbar region without neurogenic claudication: Secondary | ICD-10-CM | POA: Diagnosis not present

## 2022-06-18 DIAGNOSIS — M5416 Radiculopathy, lumbar region: Secondary | ICD-10-CM

## 2022-06-18 NOTE — Progress Notes (Signed)
Orthopedic Spine Surgery Office Note  Assessment: Patient is a 46 y.o. male with history of L4-S1 decompression and posterior fusion with TLIF's who comes back with low back pain that radiates into bilateral thighs down the leg into the medial aspect of the foot.  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   Plan: -Explained that initially conservative treatment is tried as a significant number of patients may experience relief with these treatment modalities. Discussed that the conservative treatments include:  -activity modification  -physical therapy  -over the counter pain medications  -medrol dosepak  -lumbar steroid injections -Patient has tried injection, oxycodone (helps), PT, icy hot, naproxen, lyrica, tizanidine  -Recommended L3/4 ESI and EMG/NCS. Referral was provided to him today for both of these.  -Patient should return to office in 8 weeks, repeat x-rays of lumbar spine at next visit: none   Patient expressed understanding of the plan and all questions were answered to the patient's satisfaction.   ___________________________________________________________________________  History: Patient is a 46 y.o. male who has been previously seen in the office for symptoms consistent with lumbar radiculopathy. Since the last visit, symptom intensity has remained the same.  He has pain that starts in his lower back and radiates into his right hip and bilateral anterior thighs.  It does go past the knee into the leg and the medial aspect of the feet.  He states that the foot pain is new.  He gets numbness and tingling along the medial aspect of bilateral feet.  He feels his hip pain is different than the bilateral thigh pain.  He has gotten relief of his hip pain with a prior corticosteroid injection to the right hip.  That steroid injection did not relieve his thigh pain.  He does feel that he got relief of his leg pain after his last spine surgery but his back pain has persisted.   His thigh pain is new since the surgery.  He has not noticed any new weakness.  No other numbness and tingling besides the medial aspect of bilateral feet.  No changes in bowel or bladder habits.  Previous treatments: injection, oxycodone (helps), PT, icy hot, naproxen, lyrica, tizanidine   COPY OF FIRST NOTE Patient is a 46 year old male who presents today for lumbar spine Pain location: lower back and goes into the lateral aspect of his bilateral hip and thighs, infrequently extends past his knee on either side. Also, feels it radiates into his testicles Severity of pain: severe, interferes with his daily life Mechanism of injury: none, insidious onset Duration of symptoms: back has hurt him for 20 years, legs for the last couple of years Radicular symptoms: yes, feels pain radiating to his testicles and his lateral thighs Weakness: denies  Symptoms of imbalance: reports he feels of balance frequently when walking Has not noticed any hand clumsiness or dexterity issues Paresthesias and numbness: yes - paresthesias in bilateral feet, no other paresthesias or numbness Bowel or bladder incontinence: denies Saddle anesthesia: denies Symptoms:             -leg cramping: denies             -leg heaviness: denies             -leg pain: yes, as above END OF COPY  Physical Exam:  General: no acute distress, appears stated age Neurologic: alert, answering questions appropriately, following commands Respiratory: unlabored breathing on room air, symmetric chest rise Psychiatric: appropriate affect, normal cadence to speech  MSK (spine):  -Strength exam      Left  Right EHL    4/5  4/5 TA    5/5  5/5 GSC    5/5  5/5 Knee extension  5/5  5/5 Hip flexion   5/5  5/5  -Sensory exam    Sensation intact to light touch in L3-S1 nerve distributions of bilateral lower extremities  -Achilles DTR: 1/4 on the left, 1/4 on the right -Patellar tendon DTR: 2/4 on the left, 2/4 on the  right  -Straight leg raise: negative -Contralateral straight leg raise: negative -Femoral nerve stretch test: negative -Clonus: no beats bilaterally  Bilateral hip exam: Had pain with internal rotation but not external rotation.  No tenderness palpation over the hips.  Negative Faber bilaterally.  Imaging: XR of lumbar spine from 03/28/2022 was previously independently reviewed and interpreted, showing instrumentation and interbody devices from L4-S1. There no lucencies around any of the fixation. No evidence of instrumentation complication. No evidence of instability on flexion/extension.    MRI of the lumbar spine from 05/21/2022 was independently reviewed and interpreted, showing lateral recess stenosis at L3/4. Foraminal stenosis, more significant on the right, at L3/4.   MRI from the thoracic spine on 05/21/2022 did not show any significant cord compression or central stenosis consistent with compressive myelopathy   Patient name: Adam Vaughn Patient MRN: [id number] Date of visit: 06/18/22

## 2022-06-29 ENCOUNTER — Telehealth: Payer: Commercial Managed Care - HMO | Admitting: Family Medicine

## 2022-06-29 DIAGNOSIS — J069 Acute upper respiratory infection, unspecified: Secondary | ICD-10-CM

## 2022-06-29 MED ORDER — BENZONATATE 200 MG PO CAPS: 200.0000 mg | ORAL_CAPSULE | Freq: Two times a day (BID) | ORAL | 0 refills | Status: DC | PRN

## 2022-06-29 MED ORDER — FLUTICASONE PROPIONATE 50 MCG/ACT NA SUSP: 2.0000 | Freq: Every day | NASAL | 6 refills | Status: DC

## 2022-06-29 NOTE — Progress Notes (Signed)

## 2022-07-04 ENCOUNTER — Encounter: Payer: Self-pay | Admitting: Family Medicine

## 2022-07-04 ENCOUNTER — Ambulatory Visit: Payer: Self-pay

## 2022-07-04 ENCOUNTER — Ambulatory Visit (INDEPENDENT_AMBULATORY_CARE_PROVIDER_SITE_OTHER): Payer: Medicare Other | Admitting: Physical Medicine and Rehabilitation

## 2022-07-04 VITALS — BP 135/79 | HR 86

## 2022-07-04 DIAGNOSIS — G894 Chronic pain syndrome: Secondary | ICD-10-CM

## 2022-07-04 DIAGNOSIS — M5416 Radiculopathy, lumbar region: Secondary | ICD-10-CM | POA: Diagnosis not present

## 2022-07-04 DIAGNOSIS — M961 Postlaminectomy syndrome, not elsewhere classified: Secondary | ICD-10-CM

## 2022-07-04 MED ORDER — METHYLPREDNISOLONE ACETATE 80 MG/ML IJ SUSP
80.0000 mg | Freq: Once | INTRAMUSCULAR | Status: AC
  Administered 2022-07-04: 80 mg

## 2022-07-04 NOTE — Patient Instructions (Signed)

## 2022-07-04 NOTE — Progress Notes (Signed)
Functional Pain Scale - descriptive words and definitions  Unmanageable (7)  Pain interferes with normal ADL's/nothing seems to help/sleep is very difficult/active distractions are very difficult to concentrate on. Severe range order  Average Pain 7   +Driver, -BT, -Dye Allergies.  Lower back pain that goes across the back and it radiates down both legs, left foot has numbness

## 2022-07-08 NOTE — Procedures (Signed)
Lumbosacral Transforaminal Epidural Steroid Injection - Sub-Pedicular Approach with Fluoroscopic Guidance  Patient: Adam Vaughn      Date of Birth: 1975-12-02 MRN: [id number] PCP: Cassandria Anger, MD      Visit Date: 07/04/2022   Universal Protocol:    Date/Time: 07/04/2022  Consent Given By: the patient  Position: PRONE  Additional Comments: Vital signs were monitored before and after the procedure. Patient was prepped and draped in the usual sterile fashion. The correct patient, procedure, and site was verified.   Injection Procedure Details:   Procedure diagnoses: Lumbar radiculopathy [M54.16]    Meds Administered:  Meds ordered this encounter  Medications   methylPREDNISolone acetate (DEPO-MEDROL) injection 80 mg    Laterality: Bilateral  Location/Site: L3  Needle:5.0 in., 22 ga.  Short bevel or Quincke spinal needle  Needle Placement: Transforaminal  Findings:    -Comments: Excellent flow of contrast along the nerve, nerve root and into the epidural space.  Procedure Details: After squaring off the end-plates to get a true AP view, the C-arm was positioned so that an oblique view of the foramen as noted above was visualized. The target area is just inferior to the "nose of the scotty dog" or sub pedicular. The soft tissues overlying this structure were infiltrated with 2-3 ml. of 1% Lidocaine without Epinephrine.  The spinal needle was inserted toward the target using a "trajectory" view along the fluoroscope beam.  Under AP and lateral visualization, the needle was advanced so it did not puncture dura and was located close the 6 O'Clock position of the pedical in AP tracterory. Biplanar projections were used to confirm position. Aspiration was confirmed to be negative for CSF and/or blood. A 1-2 ml. volume of Isovue-250 was injected and flow of contrast was noted at each level. Radiographs were obtained for documentation purposes.   After attaining the  desired flow of contrast documented above, a 0.5 to 1.0 ml test dose of 0.25% Marcaine was injected into each respective transforaminal space.  The patient was observed for 90 seconds post injection.  After no sensory deficits were reported, and normal lower extremity motor function was noted,   the above injectate was administered so that equal amounts of the injectate were placed at each foramen (level) into the transforaminal epidural space.   Additional Comments:  No complications occurred Dressing: 2 x 2 sterile gauze and Band-Aid    Post-procedure details: Patient was observed during the procedure. Post-procedure instructions were reviewed.  Patient left the clinic in stable condition.

## 2022-07-08 NOTE — Progress Notes (Signed)
OTHAL KUBITZ - 47 y.o. male MRN [id number]  Date of birth: [birth year]  Office Visit Note: Visit Date: 07/04/2022 PCP: Cassandria Anger, MD Referred by: Callie Fielding, MD  Subjective: Chief Complaint  Patient presents with   Lower Back - Pain   HPI:  TREYSHAWN MULDREW is a 47 y.o. male who comes in today at the request of Dr. Ileene Rubens for planned Bilateral L3-4 Lumbar Transforaminal epidural steroid injection with fluoroscopic guidance.  The patient has failed conservative care including home exercise, medications, time and activity modification.  This injection will be diagnostic and hopefully therapeutic.  Please see requesting physician notes for further details and justification.   ROS Otherwise per HPI.  Assessment & Plan: Visit Diagnoses:    ICD-10-CM   1. Lumbar radiculopathy  M54.16 XR C-ARM NO REPORT    Epidural Steroid injection    methylPREDNISolone acetate (DEPO-MEDROL) injection 80 mg    2. Post laminectomy syndrome  M96.1     3. Chronic pain syndrome  G89.4       Plan: No additional findings.   Meds & Orders:  Meds ordered this encounter  Medications   methylPREDNISolone acetate (DEPO-MEDROL) injection 80 mg    Orders Placed This Encounter  Procedures   XR C-ARM NO REPORT   Epidural Steroid injection    Follow-up: Return for visit to requesting provider as needed.   Procedures: No procedures performed  Lumbosacral Transforaminal Epidural Steroid Injection - Sub-Pedicular Approach with Fluoroscopic Guidance  Patient: JERIMEY BURRIDGE      Date of Birth: 23-Nov-1975 MRN: [id number] PCP: Cassandria Anger, MD      Visit Date: 07/04/2022   Universal Protocol:    Date/Time: 07/04/2022  Consent Given By: the patient  Position: PRONE  Additional Comments: Vital signs were monitored before and after the procedure. Patient was prepped and draped in the usual sterile fashion. The correct patient, procedure, and site was  verified.   Injection Procedure Details:   Procedure diagnoses: Lumbar radiculopathy [M54.16]    Meds Administered:  Meds ordered this encounter  Medications   methylPREDNISolone acetate (DEPO-MEDROL) injection 80 mg    Laterality: Bilateral  Location/Site: L3  Needle:5.0 in., 22 ga.  Short bevel or Quincke spinal needle  Needle Placement: Transforaminal  Findings:    -Comments: Excellent flow of contrast along the nerve, nerve root and into the epidural space.  Procedure Details: After squaring off the end-plates to get a true AP view, the C-arm was positioned so that an oblique view of the foramen as noted above was visualized. The target area is just inferior to the "nose of the scotty dog" or sub pedicular. The soft tissues overlying this structure were infiltrated with 2-3 ml. of 1% Lidocaine without Epinephrine.  The spinal needle was inserted toward the target using a "trajectory" view along the fluoroscope beam.  Under AP and lateral visualization, the needle was advanced so it did not puncture dura and was located close the 6 O'Clock position of the pedical in AP tracterory. Biplanar projections were used to confirm position. Aspiration was confirmed to be negative for CSF and/or blood. A 1-2 ml. volume of Isovue-250 was injected and flow of contrast was noted at each level. Radiographs were obtained for documentation purposes.   After attaining the desired flow of contrast documented above, a 0.5 to 1.0 ml test dose of 0.25% Marcaine was injected into each respective transforaminal space.  The patient was observed for 90 seconds post injection.  After no sensory deficits were reported, and normal lower extremity motor function was noted,   the above injectate was administered so that equal amounts of the injectate were placed at each foramen (level) into the transforaminal epidural space.   Additional Comments:  No complications occurred Dressing: 2 x 2 sterile gauze and  Band-Aid    Post-procedure details: Patient was observed during the procedure. Post-procedure instructions were reviewed.  Patient left the clinic in stable condition.    Clinical History: EXAM: MRI LUMBAR SPINE WITHOUT CONTRAST   TECHNIQUE: Multiplanar, multisequence MR imaging of the lumbar spine was performed. No intravenous contrast was administered.   COMPARISON:  01/18/2021   FINDINGS: Segmentation:  Standard.   Alignment:  2 mm retrolisthesis of L3 on L4.   Vertebrae: No acute fracture, evidence of discitis, or aggressive bone lesion. 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   Conus medullaris and cauda equina: Conus extends to the T12-L1 level. Conus and cauda equina appear normal.   Paraspinal and other soft tissues: No acute paraspinal abnormality. Postsurgical changes in the posterior paraspinal soft tissues at L4-S1.   Disc levels:   Disc spaces: Posterior lumbar interbody fusion from L4 through S1.   T12-L1: No significant disc bulge. No neural foraminal stenosis. No central canal stenosis.   L1-L2: No significant disc bulge. No neural foraminal stenosis. No central canal stenosis.   L2-L3: Minimal broad-based disc bulge. No left foraminal stenosis. Mild right foraminal stenosis. No spinal stenosis.   L3-L4: Broad-based disc bulge flattening ventral thecal sac. Mild bilateral facet arthropathy. Mild bilateral foraminal stenosis. Mild spinal stenosis. Bilateral lateral recess stenosis.   L4-L5: Interbody fusion.  No foraminal or central canal stenosis.   L5-S1: Interbody fusion.  No foraminal or central canal stenosis.   IMPRESSION: 1. At L3-4 there is a broad-based disc bulge flattening ventral thecal sac. Mild bilateral facet arthropathy. Mild bilateral foraminal stenosis. Mild spinal stenosis. Bilateral lateral recess stenosis. 2. Posterior lumbar interbody fusion from L4 through S1 without foraminal or central canal  stenosis. 3. No acute osseous injury of the lumbar spine.     Electronically Signed   By: Kathreen Devoid M.D.   On: 05/22/2022 10:20     Objective:  VS:  HT:    WT:   BMI:     BP:135/79  HR:86bpm  TEMP: ( )  RESP:  Physical Exam Vitals and nursing note reviewed.  Constitutional:      General: He is not in acute distress.    Appearance: Normal appearance. He is not ill-appearing.  HENT:     Head: Normocephalic and atraumatic.     Right Ear: External ear normal.     Left Ear: External ear normal.     Nose: No congestion.  Eyes:     Extraocular Movements: Extraocular movements intact.  Cardiovascular:     Rate and Rhythm: Normal rate.     Pulses: Normal pulses.  Pulmonary:     Effort: Pulmonary effort is normal. No respiratory distress.  Abdominal:     General: There is no distension.     Palpations: Abdomen is soft.  Musculoskeletal:        General: No tenderness or signs of injury.     Cervical back: Neck supple.     Right lower leg: No edema.     Left lower leg: No edema.     Comments: Patient has good distal strength without clonus.  Skin:    Findings: No erythema or  rash.  Neurological:     General: No focal deficit present.     Mental Status: He is alert and oriented to person, place, and time.     Sensory: No sensory deficit.     Motor: No weakness or abnormal muscle tone.     Coordination: Coordination normal.  Psychiatric:        Mood and Affect: Mood normal.        Behavior: Behavior normal.      Imaging: No results found.

## 2022-07-12 ENCOUNTER — Ambulatory Visit (INDEPENDENT_AMBULATORY_CARE_PROVIDER_SITE_OTHER): Payer: Medicare Other

## 2022-07-12 ENCOUNTER — Encounter: Payer: Self-pay | Admitting: Internal Medicine

## 2022-07-12 ENCOUNTER — Ambulatory Visit: Payer: Medicare Other | Admitting: Family Medicine

## 2022-07-12 ENCOUNTER — Ambulatory Visit: Payer: Self-pay

## 2022-07-12 ENCOUNTER — Ambulatory Visit: Payer: Medicare Other | Admitting: Internal Medicine

## 2022-07-12 ENCOUNTER — Other Ambulatory Visit: Payer: Self-pay

## 2022-07-12 ENCOUNTER — Other Ambulatory Visit: Payer: Medicaid Other

## 2022-07-12 VITALS — BP 118/82 | Ht 68.0 in | Wt 188.0 lb

## 2022-07-12 VITALS — BP 122/78 | HR 83 | Temp 98.5°F | Ht 68.0 in | Wt 198.0 lb

## 2022-07-12 DIAGNOSIS — N320 Bladder-neck obstruction: Secondary | ICD-10-CM

## 2022-07-12 DIAGNOSIS — G8929 Other chronic pain: Secondary | ICD-10-CM

## 2022-07-12 DIAGNOSIS — M25561 Pain in right knee: Secondary | ICD-10-CM | POA: Diagnosis not present

## 2022-07-12 DIAGNOSIS — T402X5A Adverse effect of other opioids, initial encounter: Secondary | ICD-10-CM

## 2022-07-12 DIAGNOSIS — R110 Nausea: Secondary | ICD-10-CM

## 2022-07-12 DIAGNOSIS — M545 Low back pain, unspecified: Secondary | ICD-10-CM | POA: Diagnosis not present

## 2022-07-12 DIAGNOSIS — M25562 Pain in left knee: Secondary | ICD-10-CM

## 2022-07-12 DIAGNOSIS — K219 Gastro-esophageal reflux disease without esophagitis: Secondary | ICD-10-CM

## 2022-07-12 MED ORDER — TAMSULOSIN HCL 0.4 MG PO CAPS: 0.4000 mg | ORAL_CAPSULE | Freq: Every day | ORAL | 3 refills | Status: AC

## 2022-07-12 MED ORDER — OXYCODONE HCL 10 MG PO TABS: 10.0000 mg | ORAL_TABLET | Freq: Three times a day (TID) | ORAL | 0 refills | Status: DC | PRN

## 2022-07-12 MED ORDER — OXYCODONE HCL 10 MG PO TABS: 10.0000 mg | ORAL_TABLET | Freq: Three times a day (TID) | ORAL | 0 refills | Status: DC

## 2022-07-12 MED ORDER — HYOSCYAMINE SULFATE 0.125 MG SL SUBL: 0.1250 mg | SUBLINGUAL_TABLET | Freq: Four times a day (QID) | SUBLINGUAL | 2 refills | Status: DC | PRN

## 2022-07-12 MED ORDER — TIZANIDINE HCL 4 MG PO CAPS: 4.0000 mg | ORAL_CAPSULE | Freq: Three times a day (TID) | ORAL | 0 refills | Status: DC

## 2022-07-12 MED ORDER — ONDANSETRON HCL 4 MG PO TABS: 4.0000 mg | ORAL_TABLET | Freq: Three times a day (TID) | ORAL | 1 refills | Status: DC | PRN

## 2022-07-12 MED ORDER — PREGABALIN 150 MG PO CAPS: 150.0000 mg | ORAL_CAPSULE | Freq: Two times a day (BID) | ORAL | 5 refills | Status: DC

## 2022-07-12 NOTE — Assessment & Plan Note (Signed)
Chronic Flomax  0.4 mg at hs

## 2022-07-12 NOTE — Progress Notes (Signed)
Adam Vaughn, am serving as a Education administrator for Dr. [name].  Adam Vaughn is a 47 y.o. male who presents to [facility] today for continued chronic bilateral knee pain and lumbar radiculopathy.  Patient was last seen by Dr. Georgina Snell on 04/17/2022 and was given bilateral knee steroid injections.  Today, patient reports that the right knee is the worst of the two so if he needed to wait on the left knee he could but he would like them both done if able. Pain started back up a month ago and has just gradually gotten worse.   His left knee is significantly better than his right knee.  He does not think his left knee is bothersome enough that it needs an injection today.  He notes some catching and popping in the right knee and a sense of giving way at times.  Dx imaging: 11/21/2021 left knee x-ray             11/03/2020 bilat knee standing x-ray 06/28/20 R and L knee XR  Pertinent review of systems: No fevers or chills  Relevant historical information: Chronic back pain and lumbar radiculopathy.   Exam:  BP 118/82   Ht '5\' 8"'$  (1.727 m)   Wt 188 lb (85.3 kg)   BMI 28.59 kg/m  General: Well Developed, well nourished, and in no acute distress.   MSK: Right knee: Mild effusion otherwise normal. Normal motion with crepitation. Tender palpation medial joint line. Stable ligamentous exam. Intact strength. Positive medial McMurray's test.    Lab and Radiology Results  Procedure: Real-time Ultrasound Guided Injection of right knee superior lateral patellar space Device: Philips Affiniti 50G Images permanently stored and available for review in PACS Ultrasound evaluation prior to injection reveals degenerative appearing medial meniscus. Verbal informed consent obtained.  Discussed risks and benefits of procedure. Warned about infection, bleeding, hyperglycemia damage to structures among others. Patient expresses understanding and agreement Time-out  conducted.   Noted no overlying erythema, induration, or other signs of local infection.   Skin prepped in a sterile fashion.   Local anesthesia: Topical Ethyl chloride.   With sterile technique and under real time ultrasound guidance: 40 mg of Kenalog and 2 mL Marcaine injected into knee joint. Fluid seen entering the joint capsule.   Completed without difficulty   Pain immediately resolved suggesting accurate placement of the medication.   Advised to call if fevers/chills, erythema, induration, drainage, or persistent bleeding.   Images permanently stored and available for review in the ultrasound unit.  Impression: Technically successful ultrasound guided injection.   X-ray images right knee obtained today personally and independently interpreted Mild medial compartment DJD.  No acute fractures. Await formal radiology review     Assessment and Plan: 47 y.o. male with right knee pain with mechanical symptoms.  Thought to be exacerbation of DJD.  It is possible he has degenerative meniscus tear as well.  Plan for steroid injection today.  If not improving consider MRI.  His previous left knee pain has resolved pretty well.  He may need a repeat injection at that at some point in future.  Check back as needed.   PDMP not reviewed this encounter. Orders Placed This Encounter  Procedures   [place] LIMITED JOINT SPACE STRUCTURES LOW BILAT(NO LINKED CHARGES)    Order Specific Question:   Reason for Exam (SYMPTOM  OR DIAGNOSIS REQUIRED)    Answer:   Bilateral knee pain    Order Specific Question:  Preferred imaging location?    Answer:   [facility]   DG Knee AP/LAT W/Sun[facility] Right    Standing Status:   Future    Number of Occurrences:   1    Standing Expiration Date:   07/13/2023    Order Specific Question:   Reason for Exam (SYMPTOM  OR DIAGNOSIS REQUIRED)    Answer:   eval knee pain    Order Specific Question:   Preferred imaging location?    Answer:    [facility]   No orders of the defined types were placed in this encounter.    Discussed warning signs or symptoms. Please see discharge instructions. Patient expresses understanding.   The above documentation has been reviewed and is accurate and complete [name], M.D.

## 2022-07-12 NOTE — Progress Notes (Signed)
Subjective:  Patient ID: Adam Vaughn, male    DOB: 1975-08-22  Age: 47 y.o. MRN: [id number]  CC: Follow-up (Pain on right side of back , has been having trouble peeing for a long time just never said anything , would like a alternative inhaler also would like refill on oxycodone)   HPI ALONTE WULFF presents for LBP C/o difficulty w/a urine flow, low pressure x long time - worse now C/o urinary issues, abd spasms  Outpatient Medications Prior to Visit  Medication Sig Dispense Refill   albuterol (PROAIR HFA) 108 (90 Base) MCG/ACT inhaler Inhale 2 puffs into the lungs every 6 (six) hours as needed for wheezing or shortness of breath. 3 each 1   benzonatate (TESSALON) 200 MG capsule Take 1 capsule (200 mg total) by mouth 2 (two) times daily as needed for cough. 20 capsule 0   Docusate Calcium (STOOL SOFTENER PO) Take 1 tablet by mouth daily.     esomeprazole (NEXIUM) 40 MG capsule Take 1 capsule (40 mg total) by mouth 2 (two) times daily before a meal. 60 capsule 6   fluticasone (FLONASE) 50 MCG/ACT nasal spray Place 2 sprays into both nostrils daily. 16 g 6   fluticasone (FLONASE) 50 MCG/ACT nasal spray Place 2 sprays into both nostrils daily. 16 g 6   Lactulose 20 GM/30ML SOLN Take 30-60 mLs (20-40 g total) by mouth 2 (two) times daily as needed (severe constipation). 1000 mL 3   loratadine (CLARITIN) 10 MG tablet Take 10 mg by mouth daily as needed for allergies.     methylcellulose (CITRUCEL) oral powder Take 1 packet by mouth daily.     mometasone (ELOCON) 0.1 % cream Apply to the affected area twice a day for up to 5 days as needed for itching     naproxen (NAPROSYN) 500 MG tablet Take 1 tablet (500 mg total) by mouth 2 (two) times daily as needed for moderate pain. 180 tablet 0   oxymetazoline (AFRIN) 0.05 % nasal spray Place 1 spray into both nostrils daily.     hyoscyamine (LEVSIN SL) 0.125 MG SL tablet Place 1 tablet (0.125 mg total) under the tongue every 6 (six) hours  as needed. 30 tablet 2   ondansetron (ZOFRAN) 4 MG tablet Take 1 tablet (4 mg total) by mouth every 8 (eight) hours as needed for nausea or vomiting. 20 tablet 1   Oxycodone HCl 10 MG TABS Take 1 tablet (10 mg total) by mouth 3 (three) times daily as needed. 90 tablet 0   Oxycodone HCl 10 MG TABS Take 1 tablet (10 mg total) by mouth 3 (three) times daily. Please fill on or after 03/13/22 90 tablet 0   pregabalin (LYRICA) 150 MG capsule Take 1 capsule (150 mg total) by mouth 2 (two) times daily. 60 capsule 5   tiZANidine (ZANAFLEX) 4 MG capsule Take 1 capsule (4 mg total) by mouth 3 (three) times daily. 180 capsule 0   famotidine (PEPCID) 40 MG tablet Take 1 tablet (40 mg total) by mouth at bedtime. (Patient not taking: Reported on 07/12/2022) 90 tablet 3   Methylnaltrexone Bromide (RELISTOR) 150 MG TABS Take 150 mg by mouth daily. (Patient not taking: Reported on 07/12/2022) 90 tablet 3   No facility-administered medications prior to visit.    ROS: Review of Systems  Constitutional:  Negative for appetite change, fatigue and unexpected weight change.  HENT:  Negative for congestion, nosebleeds, sneezing, sore throat and trouble swallowing.   Eyes:  Negative  for itching and visual disturbance.  Respiratory:  Negative for cough.   Cardiovascular:  Negative for chest pain, palpitations and leg swelling.  Gastrointestinal:  Negative for abdominal distention, blood in stool, diarrhea and nausea.  Genitourinary:  Positive for decreased urine volume and difficulty urinating. Negative for dysuria, frequency, hematuria and urgency.  Musculoskeletal:  Positive for back pain and gait problem. Negative for joint swelling and neck pain.  Skin:  Negative for rash.  Neurological:  Positive for weakness. Negative for dizziness, tremors and speech difficulty.  Psychiatric/Behavioral:  Negative for agitation, dysphoric mood and sleep disturbance. The patient is not nervous/anxious.     Objective:  BP 122/78  (BP Location: Right Arm, Patient Position: Sitting, Cuff Size: Normal)   Pulse 83   Temp 98.5 F (36.9 C) (Oral)   Ht '5\' 8"'$  (1.727 m)   Wt 198 lb (89.8 kg)   SpO2 98%   BMI 30.11 kg/m   BP Readings from Last 3 Encounters:  07/12/22 122/78  07/12/22 118/82  07/04/22 135/79    Wt Readings from Last 3 Encounters:  07/12/22 198 lb (89.8 kg)  07/12/22 188 lb (85.3 kg)  05/14/22 194 lb (88 kg)    Physical Exam Constitutional:      General: He is not in acute distress.    Appearance: He is well-developed. He is obese.     Comments: NAD  Eyes:     Conjunctiva/sclera: Conjunctivae normal.     Pupils: Pupils are equal, round, and reactive to light.  Neck:     Thyroid: No thyromegaly.     Vascular: No JVD.  Cardiovascular:     Rate and Rhythm: Normal rate and regular rhythm.     Heart sounds: Normal heart sounds. No murmur heard.    No friction rub. No gallop.  Pulmonary:     Effort: Pulmonary effort is normal. No respiratory distress.     Breath sounds: Normal breath sounds. No wheezing or rales.  Chest:     Chest wall: No tenderness.  Abdominal:     General: Bowel sounds are normal. There is no distension.     Palpations: Abdomen is soft. There is no mass.     Tenderness: There is no abdominal tenderness. There is no guarding or rebound.  Musculoskeletal:        General: Tenderness present. Normal range of motion.     Cervical back: Normal range of motion.  Lymphadenopathy:     Cervical: No cervical adenopathy.  Skin:    General: Skin is warm and dry.     Findings: No rash.  Neurological:     Mental Status: He is alert and oriented to person, place, and time.     Cranial Nerves: No cranial nerve deficit.     Motor: No weakness or abnormal muscle tone.     Coordination: Coordination abnormal.     Gait: Gait abnormal.     Deep Tendon Reflexes: Reflexes are normal and symmetric.  Psychiatric:        Behavior: Behavior normal.        Thought Content: Thought content  normal.        Judgment: Judgment normal.   Antalgic gait LS w/pain  Lab Results  Component Value Date   WBC 8.2 03/14/2022   HGB 15.0 03/14/2022   HCT 44.2 03/14/2022   PLT 257.0 03/14/2022   GLUCOSE 88 03/14/2022   CHOL 171 03/14/2022   TRIG 165.0 (H) 03/14/2022   HDL 50.20 03/14/2022  LDLCALC 88 03/14/2022   ALT 31 03/14/2022   AST 19 03/14/2022   NA 139 03/14/2022   K 4.0 03/14/2022   CL 105 03/14/2022   CREATININE 0.93 03/14/2022   BUN 13 03/14/2022   CO2 27 03/14/2022   TSH 0.65 03/14/2022   PSA 1.83 09/04/2021   HGBA1C 5.5 09/04/2021    MR Lumbar Spine w/o contrast  Result Date: 05/22/2022 CLINICAL DATA:  Low back pain, bilateral leg numbness EXAM: MRI LUMBAR SPINE WITHOUT CONTRAST TECHNIQUE: Multiplanar, multisequence MR imaging of the lumbar spine was performed. No intravenous contrast was administered. COMPARISON:  01/18/2021 FINDINGS: Segmentation:  Standard. Alignment:  2 mm retrolisthesis of L3 on L4. Vertebrae: No acute fracture, evidence of discitis, or aggressive bone lesion. 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 Conus medullaris and cauda equina: Conus extends to the T12-L1 level. Conus and cauda equina appear normal. Paraspinal and other soft tissues: No acute paraspinal abnormality. Postsurgical changes in the posterior paraspinal soft tissues at L4-S1. Disc levels: Disc spaces: Posterior lumbar interbody fusion from L4 through S1. T12-L1: No significant disc bulge. No neural foraminal stenosis. No central canal stenosis. L1-L2: No significant disc bulge. No neural foraminal stenosis. No central canal stenosis. L2-L3: Minimal broad-based disc bulge. No left foraminal stenosis. Mild right foraminal stenosis. No spinal stenosis. L3-L4: Broad-based disc bulge flattening ventral thecal sac. Mild bilateral facet arthropathy. Mild bilateral foraminal stenosis. Mild spinal stenosis. Bilateral lateral recess stenosis. L4-L5: Interbody fusion.  No  foraminal or central canal stenosis. L5-S1: Interbody fusion.  No foraminal or central canal stenosis. IMPRESSION: 1. At L3-4 there is a broad-based disc bulge flattening ventral thecal sac. Mild bilateral facet arthropathy. Mild bilateral foraminal stenosis. Mild spinal stenosis. Bilateral lateral recess stenosis. 2. Posterior lumbar interbody fusion from L4 through S1 without foraminal or central canal stenosis. 3. No acute osseous injury of the lumbar spine. Electronically Signed   By: Kathreen Devoid M.D.   On: 05/22/2022 10:20   MR Thoracic Spine w/o contrast  Result Date: 05/22/2022 CLINICAL DATA:  Acute thoracic myelopathy. Low back pain for 2 years. EXAM: MRI THORACIC SPINE WITHOUT CONTRAST TECHNIQUE: Multiplanar, multisequence MR imaging of the thoracic spine was performed. No intravenous contrast was administered. COMPARISON:  None Available. FINDINGS: Alignment:  Physiologic. Vertebrae: No acute fracture, evidence of discitis, or aggressive bone lesion. Vertebral body hemangioma is in the T4, T5, T7, T11 and L1 vertebral bodies. Cord:  Normal signal and morphology. Paraspinal and other soft tissues: No acute paraspinal abnormality. Disc levels: Disc spaces:  Disc spaces are maintained. T1-T2: No disc protrusion, foraminal stenosis or central canal stenosis. T2-T3: No disc protrusion, foraminal stenosis or central canal stenosis. T3-T4: Tiny central disc protrusion. No foraminal or central canal stenosis. T4-T5: No disc protrusion, foraminal stenosis or central canal stenosis. T5-T6: No disc protrusion, foraminal stenosis or central canal stenosis. T6-T7: No disc protrusion, foraminal stenosis or central canal stenosis. T7-T8: No disc protrusion, foraminal stenosis or central canal stenosis. T8-T9: No disc protrusion, foraminal stenosis or central canal stenosis. T9-T10: No disc protrusion, foraminal stenosis or central canal stenosis. T10-T11: No disc protrusion, foraminal stenosis or central canal  stenosis. T11-T12: No disc protrusion, foraminal stenosis or central canal stenosis. IMPRESSION: 1. No acute osseous injury of the thoracic spine. 2. At T3-4 there is a tiny central disc protrusion. 3. Otherwise no significant thoracic spine disc protrusion, foraminal stenosis or central canal stenosis. Electronically Signed   By: Kathreen Devoid M.D.   On: 05/22/2022 09:58  Assessment & Plan:   Problem List Items Addressed This Visit       Genitourinary   Bladder neck obstruction    Chronic Flomax  0.4 mg at hs        Other   Low back pain - Primary    Cont on oxycodone 10 mg 3 times daily.   Potential benefits of a long term opioids use as well as potential risks (i.e. addiction risk, apnea etc) and complications (i.e. Somnolence, constipation and others) were explained to the patient and were aknowledged.      Relevant Medications   tiZANidine (ZANAFLEX) 4 MG capsule   Oxycodone HCl 10 MG TABS   Oxycodone HCl 10 MG TABS      Meds ordered this encounter  Medications   ondansetron (ZOFRAN) 4 MG tablet    Sig: Take 1 tablet (4 mg total) by mouth every 8 (eight) hours as needed for nausea or vomiting.    Dispense:  20 tablet    Refill:  1   pregabalin (LYRICA) 150 MG capsule    Sig: Take 1 capsule (150 mg total) by mouth 2 (two) times daily.    Dispense:  60 capsule    Refill:  5   tiZANidine (ZANAFLEX) 4 MG capsule    Sig: Take 1 capsule (4 mg total) by mouth 3 (three) times daily.    Dispense:  180 capsule    Refill:  0   Oxycodone HCl 10 MG TABS    Sig: Take 1 tablet (10 mg total) by mouth 3 (three) times daily as needed.    Dispense:  90 tablet    Refill:  0    Please fill on or after 07/14/22   Oxycodone HCl 10 MG TABS    Sig: Take 1 tablet (10 mg total) by mouth 3 (three) times daily. Please fill on or after 03/13/22    Dispense:  90 tablet    Refill:  0    Please fill on or after 08/13/22   hyoscyamine (LEVSIN SL) 0.125 MG SL tablet    Sig: Place 1 tablet (0.125  mg total) under the tongue every 6 (six) hours as needed.    Dispense:  30 tablet    Refill:  2   tamsulosin (FLOMAX) 0.4 MG CAPS capsule    Sig: Take 1 capsule (0.4 mg total) by mouth daily.    Dispense:  90 capsule    Refill:  3      Follow-up: Return in about 2 months (around 09/10/2022) for a follow-up visit.  Walker Kehr, MD

## 2022-07-12 NOTE — Assessment & Plan Note (Signed)
Cont on oxycodone 10 mg 3 times daily.   Potential benefits of a long term opioids use as well as potential risks (i.e. addiction risk, apnea etc) and complications (i.e. Somnolence, constipation and others) were explained to the patient and were aknowledged.

## 2022-07-12 NOTE — Patient Instructions (Signed)
Thank you for coming in today.   Please get an Xray today before you leave   Call or go to the ER if you develop a large red swollen joint with extreme pain or oozing puss.    Let me know how it goes.

## 2022-07-13 ENCOUNTER — Encounter: Payer: Self-pay | Admitting: Family Medicine

## 2022-07-16 NOTE — Progress Notes (Signed)
Right knee x-ray shows mild arthritis.

## 2022-07-17 ENCOUNTER — Other Ambulatory Visit: Payer: Self-pay

## 2022-07-17 DIAGNOSIS — R202 Paresthesia of skin: Secondary | ICD-10-CM

## 2022-07-17 LAB — CALPROTECTIN, FECAL: Calprotectin, Fecal: 174 ug/g — ABNORMAL HIGH (ref 0–120)

## 2022-07-17 LAB — SPECIMEN STATUS REPORT

## 2022-07-19 ENCOUNTER — Ambulatory Visit: Payer: Medicare Other | Admitting: Neurology

## 2022-07-19 ENCOUNTER — Encounter: Payer: Self-pay | Admitting: Gastroenterology

## 2022-07-19 DIAGNOSIS — M5417 Radiculopathy, lumbosacral region: Secondary | ICD-10-CM

## 2022-07-19 DIAGNOSIS — R202 Paresthesia of skin: Secondary | ICD-10-CM

## 2022-07-19 NOTE — Procedures (Signed)
[facility]  [address]  [place], [place] [address] Tel: [phone] Fax: [phone] Test Date:  07/19/2022  Patient: Adam Vaughn DOB: 31-Dec-1975 Physician: Narda Amber, DO  Sex: Male Height: '5\' 8"'$  Ref Phys: Ileene Rubens, MD  ID#: [id number]   Technician:    History: This is a 47 year old man with history of prior lumbar surgery referred for evaluation of bilateral leg paresthesias concerning for L3 radiculopathy.  NCV & EMG Findings: Electrodiagnostic testing of the right lower extremity and additional studies of the left shows: Bilateral sural and superficial peroneal sensory responses are within normal limits. Bilateral peroneal and tibial motor responses are within normal limits. Bilateral tibial H reflex studies are within normal limits. Chronic motor axon loss changes are seen affecting the rectus femoris and adductor longus muscles on the right, without accompanying active denervation.  These findings are not present in the left lower extremity.  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 There is no evidence of a large fiber sensorimotor polyneuropathy affecting the lower extremities.   ___________________________ Narda Amber, DO    Nerve Conduction Studies   Stim Site NR Peak (ms) Norm Peak (ms) O-P Amp (V) Norm O-P Amp  Left Sup Peroneal Anti Sensory (Ant Lat Mall)  32 C  12 cm    2.7 <4.5 12.7 >5  Right Sup Peroneal Anti Sensory (Ant Lat Mall)  32 C  12 cm    2.5 <4.5 12.4 >5  Left Sural Anti Sensory (Lat Mall)  32 C  Calf    2.4 <4.5 22.9 >5  Right Sural Anti Sensory (Lat Mall)  32 C  Calf    3.1 <4.5 22.7 >5     Stim Site NR Onset (ms) Norm Onset (ms) O-P Amp (mV) Norm O-P Amp Site1 Site2 Delta-0 (ms) Dist (cm) Vel (m/s) Norm Vel (m/s)  Left Peroneal Motor (Ext Dig Brev)  32 C  Ankle    4.8 <5.5 5.1 >3 B Fib Ankle 7.0 34.0 49 >40  B Fib    11.8  5.1  Poplt B Fib 1.8 8.0 44 >40  Poplt    13.6  4.8          Right Peroneal Motor (Ext Dig Brev)  32 C  Ankle    4.5 <5.5 4.8 >3 B Fib Ankle 6.5 37.0 57 >40  B Fib    11.0  4.7  Poplt B Fib 2.0 10.0 50 >40  Poplt    13.0  4.7         Left Tibial Motor (Abd Hall Brev)  32 C  Ankle    4.1 <6.0 12.6 >8 Knee Ankle 7.7 41.0 53 >40  Knee    11.8  10.1         Right Tibial Motor (Abd Hall Brev)  32 C  Ankle    3.7 <6.0 10.3 >8 Knee Ankle 7.8 41.0 53 >40  Knee    11.5  8.9          Electromyography   Side Muscle Ins.Act Fibs Fasc Recrt Amp Dur Poly Activation Comment  Right AntTibialis Nml Nml Nml Nml Nml Nml Nml Nml N/A  Right Gastroc Nml Nml Nml Nml Nml Nml Nml Nml N/A  Right Flex Dig Long Nml Nml Nml Nml Nml Nml Nml Nml N/A  Right RectFemoris Nml Nml Nml *1- *1+ *1+ *1+ Nml N/A  Right GluteusMed Nml Nml Nml Nml Nml Nml Nml Nml N/A  Left AntTibialis Nml Nml Nml Nml  Nml Nml Nml Nml N/A  Left Gastroc Nml Nml Nml Nml Nml Nml Nml Nml N/A  Left Flex Dig Long Nml Nml Nml Nml Nml Nml Nml Nml N/A  Left RectFemoris Nml Nml Nml Nml Nml Nml Nml Nml N/A  Left GluteusMed Nml Nml Nml Nml Nml Nml Nml Nml N/A  Right AdductorLong Nml Nml Nml *2- *1+ *1+ *1+ Nml N/A      Waveforms:

## 2022-07-20 NOTE — Telephone Encounter (Signed)
Has he had any improvement in the abdominal cramping with changing the Bentyl to Levsin?  We also tried starting Relistor for OIC.  Has not made any difference?  The fecal calprotectin was very mildly elevated at 174.   1 consideration could be for a short course of 5-ASA therapy (i.e. Lialda or Apriso), but I really do not think that the mild colitis seen on the previous colonoscopy should be playing a major role since that appears to be more medication induced on the biopsy.  We could consider if ongoing lower GI symptoms.

## 2022-07-24 ENCOUNTER — Ambulatory Visit: Payer: Medicare Other | Admitting: Orthopedic Surgery

## 2022-07-24 DIAGNOSIS — M5416 Radiculopathy, lumbar region: Secondary | ICD-10-CM | POA: Diagnosis not present

## 2022-07-24 NOTE — Progress Notes (Signed)
Orthopedic Spine Surgery Office Note  Assessment: Patient is a 47 y.o. male with history of L4-S1 decompression and posterior fusion with TLIF's who comes back with low back pain that radiates into bilateral thighs down the leg into the medial aspect of the foot.  Has pain in several other distributions and the majority of his pain is in his back.  His EMG/NCS showed L3/4 right-sided radiculopathy.  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   Plan: -Patient has tried injection, oxycodone (helps), PT, icy hot, naproxen, lyrica, tizanidine, pain management -Recommended he continue with pain management -I discussed that his EMG/NCS showed right-sided L3-4 radiculopathy.  His MRI shows lateral recess and foraminal stenosis at L3-4 above his old fusion.  I told him that decompression extension of his fusion could help with some of his thigh pain and medial calf pain because that matches more of the L3 and L4 distributions.  I told him it would likely not help with his back pain or the posterior calf or plantar aspect of foot pain.  After this discussion, patient did not want to proceed with surgery -Patient should return to office on an as needed basis   Patient expressed understanding of the plan and all questions were answered to the patient's satisfaction.   ___________________________________________________________________________  History: Patient is a 47 y.o. male who has been previously seen in the office for symptoms consistent with lumbar radiculopathy. Since the last visit, his symptoms have remained the same.  He is having significant pain in his back.  The majority of his pain is in the lumbar spine.  He does have pain that radiates into his anterior thighs bilaterally.  It then goes past the knee into the legs and the medial aspect of the foot.  He also gets pain on the plantar aspect of the foot.  Has decreased sensation on the medial and plantar aspect of the bilateral feet.  He tried an  injection at L3-4 since I last saw him.  He says that it helped with foot numbness but did not change any of his pain.  Gets paresthesias on the medial aspect of the bilateral feet.  No other numbness or paresthesias.  No change in bowel or bladder habits.  Previous treatments: injection, oxycodone (helps), PT, icy hot, naproxen, lyrica, tizanidine, pain management  COPY OF FIRST NOTE Patient is a 47 year old male who presents today for lumbar spine Pain location: lower back and goes into the lateral aspect of his bilateral hip and thighs, infrequently extends past his knee on either side. Also, feels it radiates into his testicles Severity of pain: severe, interferes with his daily life Mechanism of injury: none, insidious onset Duration of symptoms: back has hurt him for 20 years, legs for the last couple of years Radicular symptoms: yes, feels pain radiating to his testicles and his lateral thighs Weakness: denies  Symptoms of imbalance: reports he feels of balance frequently when walking Has not noticed any hand clumsiness or dexterity issues Paresthesias and numbness: yes - paresthesias in bilateral feet, no other paresthesias or numbness Bowel or bladder incontinence: denies Saddle anesthesia: denies Symptoms:             -leg cramping: denies             -leg heaviness: denies             -leg pain: yes, as above END OF COPY  Physical Exam:  General: no acute distress, appears stated age Neurologic: alert,  answering questions appropriately, following commands Respiratory: unlabored breathing on room air, symmetric chest rise Psychiatric: appropriate affect, normal cadence to speech   MSK (spine):  -Strength exam      Left  Right EHL    4/5  4/5 TA    5/5  5/5 GSC    5/5  5/5 Knee extension  5/5  5/5 Hip flexion   5/5  5/5  -Sensory exam    Sensation intact to light touch in L3-S1 nerve distributions of bilateral lower extremities  Imaging: XR of lumbar spine from  03/28/2022 was previously independently reviewed and interpreted, showing instrumentation and interbody devices from L4-S1. There no lucencies around any of the fixation. No evidence of instrumentation complication. No evidence of instability on flexion/extension.     MRI of the lumbar spine from 05/21/2022 was previously independently reviewed and interpreted, showing lateral recess stenosis at L3/4. Foraminal stenosis, more significant on the right, at L3/4.   EMG/NCS from 07/19/2022 was consistent with right L3-4 radiculopathy (mild)   Patient name: Adam Vaughn Patient MRN: [id number] Date of visit: 07/24/22

## 2022-07-26 ENCOUNTER — Telehealth: Payer: Self-pay | Admitting: *Deleted

## 2022-07-26 NOTE — Telephone Encounter (Signed)
P t need Pa on Tizanidine. Tried to initiate via cover-my-meds. Rec'd msg To initiate an authorization request for this medication, please contact [employer] at [phone]. Called Medicaid [employer] spoke w/ [facility] PA rep. She states pt has a end term date of 07/01/22. He need to contact them to re- established..Called pt no answer LMOM w/ response.Marland KitchenJohny Chess

## 2022-08-03 ENCOUNTER — Other Ambulatory Visit: Payer: Self-pay

## 2022-08-03 MED ORDER — RELISTOR 150 MG PO TABS: 150.0000 mg | ORAL_TABLET | Freq: Every day | ORAL | 3 refills | Status: DC

## 2022-08-03 NOTE — Telephone Encounter (Signed)
Is there patient assistance for relistor?

## 2022-08-03 NOTE — Telephone Encounter (Signed)
Can try using OTC IBgard for the next 4 weeks.  This can be used in conjunction with any of the other prescribed medications.

## 2022-08-06 ENCOUNTER — Other Ambulatory Visit (HOSPITAL_COMMUNITY): Payer: Self-pay

## 2022-08-08 ENCOUNTER — Other Ambulatory Visit (HOSPITAL_COMMUNITY): Payer: Self-pay

## 2022-08-08 ENCOUNTER — Telehealth: Payer: Self-pay | Admitting: Pharmacy Technician

## 2022-08-08 NOTE — Telephone Encounter (Signed)
Patient Advocate Encounter  Prior Authorization for RELISTOR '150MG'$  has been approved.    PA# [id number] Effective dates: 2.1.24 through 12.31.24   Received notification from [facility] that [place] for RELISTOR '150MG'$  is ending and may need a prior authorization.   PA submitted on 2.7.24 Key [id number] Status is pending

## 2022-08-08 NOTE — Telephone Encounter (Signed)
Pharmacy is currently closed, however, I was able to find a copay card for Relistor. Card is activated and can be presented to pharmacy with provided information. Test billing results with [facility] was successful and returned a $0 copay when split billed with patient insurance.

## 2022-08-13 ENCOUNTER — Ambulatory Visit: Payer: Commercial Managed Care - HMO | Admitting: Orthopedic Surgery

## 2022-08-16 ENCOUNTER — Telehealth: Payer: Self-pay

## 2022-08-16 NOTE — Progress Notes (Unsigned)
   I, Peterson Lombard, LAT, ATC acting as a scribe for Adam Leader, MD.  Adam Vaughn is a 47 y.o. male who presents to [place] [facility] today for cont'd bilat knee pain. Pt was last seen by Dr. On 07/12/22 and was given a R knee steroid injection. Today, pt reports R knee is feeling much better since injection in Jan. Pt reports ***  Dx imaging: 07/12/22 R knee XR 11/21/2021 left knee x-ray             11/03/2020 bilat knee standing x-ray 06/28/20 R and L knee XR  Pertinent review of systems: ***  Relevant historical information: ***   Exam:  There were no vitals taken for this visit. General: Well Developed, well nourished, and in no acute distress.   MSK: ***    Lab and Radiology Results No results found for this or any previous visit (from the past 72 hour(s)). No results found.     Assessment and Plan: 47 y.o. male with ***   PDMP not reviewed this encounter. No orders of the defined types were placed in this encounter.  No orders of the defined types were placed in this encounter.    Discussed warning signs or symptoms. Please see discharge instructions. Patient expresses understanding.   ***

## 2022-08-16 NOTE — Telephone Encounter (Signed)
Called pt to clarify his expectation for his visit on Monday, since the R knee was just injection a month ago. Pt reports it's his LEFT knee that he was wanting injected.

## 2022-08-20 ENCOUNTER — Ambulatory Visit: Payer: Self-pay

## 2022-08-20 ENCOUNTER — Ambulatory Visit: Payer: Medicare HMO | Admitting: Family Medicine

## 2022-08-20 ENCOUNTER — Encounter: Payer: Self-pay | Admitting: Family Medicine

## 2022-08-20 VITALS — BP 116/80 | HR 69 | Ht 68.0 in | Wt 198.2 lb

## 2022-08-20 DIAGNOSIS — M25562 Pain in left knee: Secondary | ICD-10-CM | POA: Diagnosis not present

## 2022-08-20 DIAGNOSIS — G8929 Other chronic pain: Secondary | ICD-10-CM | POA: Diagnosis not present

## 2022-08-20 DIAGNOSIS — M25561 Pain in right knee: Secondary | ICD-10-CM

## 2022-08-20 NOTE — Patient Instructions (Addendum)
Thank you for coming in today.   You received an injection today. Seek immediate medical attention if the joint becomes red, extremely painful, or is oozing fluid.   Let me know how it goes.

## 2022-08-27 ENCOUNTER — Other Ambulatory Visit (HOSPITAL_COMMUNITY): Payer: Self-pay

## 2022-08-27 ENCOUNTER — Telehealth: Payer: Self-pay | Admitting: Pharmacy Technician

## 2022-08-27 NOTE — Telephone Encounter (Signed)
Patient Advocate Encounter  Received notification from [place] that prior authorization for ESOMEPRAZOLE is required.   PA submitted on 2.26.24 Key [id number] Status is pending

## 2022-08-28 ENCOUNTER — Other Ambulatory Visit (HOSPITAL_COMMUNITY): Payer: Self-pay

## 2022-08-28 NOTE — Telephone Encounter (Signed)
Patient Advocate Encounter  Prior Authorization for ESOMEPRAZOLE '40MG'$  has been approved.    PA# [id number] Effective dates: 2.26.24 through 12.31.24

## 2022-08-31 ENCOUNTER — Encounter: Payer: Self-pay | Admitting: Podiatry

## 2022-09-03 ENCOUNTER — Ambulatory Visit: Payer: Medicaid Other | Admitting: Podiatry

## 2022-09-03 ENCOUNTER — Encounter: Payer: Self-pay | Admitting: Podiatry

## 2022-09-03 DIAGNOSIS — M779 Enthesopathy, unspecified: Secondary | ICD-10-CM

## 2022-09-03 DIAGNOSIS — M7661 Achilles tendinitis, right leg: Secondary | ICD-10-CM

## 2022-09-03 DIAGNOSIS — M7751 Other enthesopathy of right foot: Secondary | ICD-10-CM | POA: Diagnosis not present

## 2022-09-03 MED ORDER — TRIAMCINOLONE ACETONIDE 10 MG/ML IJ SUSP
10.0000 mg | Freq: Once | INTRAMUSCULAR | Status: AC
  Administered 2022-09-03: 10 mg

## 2022-09-05 NOTE — Progress Notes (Signed)
Subjective:   Patient ID: Adam Vaughn, male   DOB: 47 y.o.   MRN: [id number]   HPI Patient states he is doing quite a bit better on the back of the heel and the ankle but he is getting some pain down the outside of the foot   ROS      Objective:  Physical Exam  Neurovascular status unchanged with patient now having discomfort more towards the peroneal complex right with diminished discomfort sinus tarsi Achilles     Assessment:  Probability for moderate peroneal inflammation tendinitis right     Plan:  H&P reviewed condition and went ahead today and discussed injection explaining risk of injection he wants this done sterile prep injected the peroneal complex 3 mg Dexasone Kenalog 5 mg Xylocaine advised on reduced activity reappoint as needed

## 2022-09-11 ENCOUNTER — Ambulatory Visit: Payer: Medicare HMO | Admitting: Internal Medicine

## 2022-09-11 ENCOUNTER — Encounter: Payer: Self-pay | Admitting: Internal Medicine

## 2022-09-11 VITALS — BP 130/82 | HR 80 | Temp 98.7°F | Ht 68.0 in | Wt 194.0 lb

## 2022-09-11 DIAGNOSIS — I2510 Atherosclerotic heart disease of native coronary artery without angina pectoris: Secondary | ICD-10-CM

## 2022-09-11 DIAGNOSIS — R0789 Other chest pain: Secondary | ICD-10-CM

## 2022-09-11 DIAGNOSIS — R5383 Other fatigue: Secondary | ICD-10-CM

## 2022-09-11 DIAGNOSIS — R7989 Other specified abnormal findings of blood chemistry: Secondary | ICD-10-CM

## 2022-09-11 DIAGNOSIS — G8929 Other chronic pain: Secondary | ICD-10-CM | POA: Diagnosis not present

## 2022-09-11 DIAGNOSIS — I2583 Coronary atherosclerosis due to lipid rich plaque: Secondary | ICD-10-CM

## 2022-09-11 DIAGNOSIS — M545 Low back pain, unspecified: Secondary | ICD-10-CM | POA: Diagnosis not present

## 2022-09-11 LAB — COMPREHENSIVE METABOLIC PANEL
ALT: 39 U/L (ref 0–53)
AST: 20 U/L (ref 0–37)
Albumin: 4.3 g/dL (ref 3.5–5.2)
Alkaline Phosphatase: 88 U/L (ref 39–117)
BUN: 16 mg/dL (ref 6–23)
CO2: 29 mEq/L (ref 19–32)
Calcium: 9.8 mg/dL (ref 8.4–10.5)
Chloride: 101 mEq/L (ref 96–112)
Creatinine, Ser: 0.91 mg/dL (ref 0.40–1.50)
GFR: 100.7 mL/min (ref >60.00)
Glucose, Bld: 94 mg/dL (ref 70–99)
Potassium: 3.8 mEq/L (ref 3.5–5.1)
Sodium: 137 mEq/L (ref 135–145)
Total Bilirubin: 0.4 mg/dL (ref 0.2–1.2)
Total Protein: 6.9 g/dL (ref 6.0–8.3)

## 2022-09-11 LAB — TSH: TSH: 1.27 u[IU]/mL (ref 0.35–5.50)

## 2022-09-11 LAB — TESTOSTERONE: Testosterone: 233.18 ng/dL — ABNORMAL LOW (ref 300.00–890.00)

## 2022-09-11 MED ORDER — K2 PLUS D3 100-1000 MCG-UNIT PO TABS: ORAL_TABLET | ORAL | 3 refills | Status: AC

## 2022-09-11 MED ORDER — OXYCODONE HCL 10 MG PO TABS: 10.0000 mg | ORAL_TABLET | Freq: Three times a day (TID) | ORAL | 0 refills | Status: DC | PRN

## 2022-09-11 MED ORDER — OXYCODONE HCL 10 MG PO TABS: 10.0000 mg | ORAL_TABLET | Freq: Three times a day (TID) | ORAL | 0 refills | Status: DC

## 2022-09-11 MED ORDER — PREGABALIN 150 MG PO CAPS: 150.0000 mg | ORAL_CAPSULE | Freq: Two times a day (BID) | ORAL | 5 refills | Status: DC

## 2022-09-11 NOTE — Assessment & Plan Note (Addendum)
Agaston score is 6 Fam h/o CAD - F, S Start baby ASA, Krill oil, Vit D3+K2 Pt stopped Crestor Cardiol ref

## 2022-09-11 NOTE — Progress Notes (Signed)
Subjective:  Patient ID: Adam Vaughn, male    DOB: 08/22/1975  Age: 47 y.o. MRN: [id number]  CC: Follow-up (2 month f/u)   HPI Adam Vaughn presents for LBP C/o weakness, fatigue, GERD C/o CP at times  Outpatient Medications Prior to Visit  Medication Sig Dispense Refill   albuterol (PROAIR HFA) 108 (90 Base) MCG/ACT inhaler Inhale 2 puffs into the lungs every 6 (six) hours as needed for wheezing or shortness of breath. 3 each 1   Docusate Calcium (STOOL SOFTENER PO) Take 1 tablet by mouth daily.     esomeprazole (NEXIUM) 40 MG capsule Take 1 capsule (40 mg total) by mouth 2 (two) times daily before a meal. 60 capsule 6   famotidine (PEPCID) 40 MG tablet Take 1 tablet (40 mg total) by mouth at bedtime. 90 tablet 3   fluticasone (FLONASE) 50 MCG/ACT nasal spray Place 2 sprays into both nostrils daily. 16 g 6   fluticasone (FLONASE) 50 MCG/ACT nasal spray Place 2 sprays into both nostrils daily. 16 g 6   hyoscyamine (LEVSIN SL) 0.125 MG SL tablet Place 1 tablet (0.125 mg total) under the tongue every 6 (six) hours as needed. 30 tablet 2   Lactulose 20 GM/30ML SOLN Take 30-60 mLs (20-40 g total) by mouth 2 (two) times daily as needed (severe constipation). 1000 mL 3   loratadine (CLARITIN) 10 MG tablet Take 10 mg by mouth daily as needed for allergies.     methylcellulose (CITRUCEL) oral powder Take 1 packet by mouth daily.     naproxen (NAPROSYN) 500 MG tablet Take 1 tablet (500 mg total) by mouth 2 (two) times daily as needed for moderate pain. 180 tablet 0   ondansetron (ZOFRAN) 4 MG tablet Take 1 tablet (4 mg total) by mouth every 8 (eight) hours as needed for nausea or vomiting. 20 tablet 1   oxymetazoline (AFRIN) 0.05 % nasal spray Place 1 spray into both nostrils daily.     rosuvastatin (CRESTOR) 5 MG tablet Take 5 mg by mouth daily.     tamsulosin (FLOMAX) 0.4 MG CAPS capsule Take 1 capsule (0.4 mg total) by mouth daily. 90 capsule 3   tiZANidine (ZANAFLEX) 4 MG  capsule Take 1 capsule (4 mg total) by mouth 3 (three) times daily. 180 capsule 0   Oxycodone HCl 10 MG TABS Take 1 tablet (10 mg total) by mouth 3 (three) times daily as needed. 90 tablet 0   Oxycodone HCl 10 MG TABS Take 1 tablet (10 mg total) by mouth 3 (three) times daily. Please fill on or after 03/13/22 90 tablet 0   pregabalin (LYRICA) 150 MG capsule Take 1 capsule (150 mg total) by mouth 2 (two) times daily. 60 capsule 5   benzonatate (TESSALON) 200 MG capsule Take 1 capsule (200 mg total) by mouth 2 (two) times daily as needed for cough. 20 capsule 0   No facility-administered medications prior to visit.    ROS: Review of Systems  Constitutional:  Negative for appetite change, fatigue and unexpected weight change.  HENT:  Negative for congestion, nosebleeds, sneezing, sore throat and trouble swallowing.   Eyes:  Negative for itching and visual disturbance.  Respiratory:  Negative for cough.   Cardiovascular:  Positive for chest pain. Negative for palpitations and leg swelling.  Gastrointestinal:  Negative for abdominal distention, blood in stool, diarrhea and nausea.  Genitourinary:  Negative for frequency and hematuria.  Musculoskeletal:  Positive for back pain and gait problem. Negative for joint  swelling and neck pain.  Skin:  Negative for rash.  Neurological:  Positive for weakness. Negative for dizziness, tremors and speech difficulty.  Psychiatric/Behavioral:  Negative for agitation, dysphoric mood, sleep disturbance and suicidal ideas. The patient is not nervous/anxious.     Objective:  BP 130/82 (BP Location: Left Arm, Patient Position: Sitting, Cuff Size: Normal)   Pulse 80   Temp 98.7 F (37.1 C) (Oral)   Ht '5\' 8"'$  (1.727 m)   Wt 194 lb (88 kg)   SpO2 99%   BMI 29.50 kg/m   BP Readings from Last 3 Encounters:  09/11/22 130/82  08/20/22 116/80  07/12/22 122/78    Wt Readings from Last 3 Encounters:  09/11/22 194 lb (88 kg)  08/20/22 198 lb 3.2 oz (89.9 kg)   07/12/22 198 lb (89.8 kg)    Physical Exam Constitutional:      General: He is not in acute distress.    Appearance: Normal appearance. He is well-developed.     Comments: NAD  Eyes:     Conjunctiva/sclera: Conjunctivae normal.     Pupils: Pupils are equal, round, and reactive to light.  Neck:     Thyroid: No thyromegaly.     Vascular: No JVD.  Cardiovascular:     Rate and Rhythm: Normal rate and regular rhythm.     Heart sounds: Normal heart sounds. No murmur heard.    No friction rub. No gallop.  Pulmonary:     Effort: Pulmonary effort is normal. No respiratory distress.     Breath sounds: Normal breath sounds. No wheezing or rales.  Chest:     Chest wall: No tenderness.  Abdominal:     General: Bowel sounds are normal. There is no distension.     Palpations: Abdomen is soft. There is no mass.     Tenderness: There is no abdominal tenderness. There is no guarding or rebound.  Musculoskeletal:        General: No tenderness. Normal range of motion.     Cervical back: Normal range of motion.  Lymphadenopathy:     Cervical: No cervical adenopathy.  Skin:    General: Skin is warm and dry.     Findings: No rash.  Neurological:     Mental Status: He is alert and oriented to person, place, and time.     Cranial Nerves: No cranial nerve deficit.     Motor: No abnormal muscle tone.     Coordination: Coordination normal.     Gait: Gait normal.     Deep Tendon Reflexes: Reflexes are normal and symmetric.  Psychiatric:        Behavior: Behavior normal.        Thought Content: Thought content normal.        Judgment: Judgment normal.      A total time of 45 minutes was spent preparing to see the patient, reviewing tests, x-rays, operative reports and other medical records.  Also, obtaining history and performing comprehensive physical exam.  Additionally, counseling the patient regarding the above listed issues - CP, CAD, LBP. Finally, documenting clinical information in the  health records, coordination of care, educating the patient. It is a complex case.   Lab Results  Component Value Date   WBC 8.2 03/14/2022   HGB 15.0 03/14/2022   HCT 44.2 03/14/2022   PLT 257.0 03/14/2022   GLUCOSE 88 03/14/2022   CHOL 171 03/14/2022   TRIG 165.0 (H) 03/14/2022   HDL 50.20 03/14/2022   LDLCALC 88  03/14/2022   ALT 31 03/14/2022   AST 19 03/14/2022   NA 139 03/14/2022   K 4.0 03/14/2022   CL 105 03/14/2022   CREATININE 0.93 03/14/2022   BUN 13 03/14/2022   CO2 27 03/14/2022   TSH 0.65 03/14/2022   PSA 1.83 09/04/2021   HGBA1C 5.5 09/04/2021    MR Lumbar Spine w/o contrast  Result Date: 05/22/2022 CLINICAL DATA:  Low back pain, bilateral leg numbness EXAM: MRI LUMBAR SPINE WITHOUT CONTRAST TECHNIQUE: Multiplanar, multisequence MR imaging of the lumbar spine was performed. No intravenous contrast was administered. COMPARISON:  01/18/2021 FINDINGS: Segmentation:  Standard. Alignment:  2 mm retrolisthesis of L3 on L4. Vertebrae: No acute fracture, evidence of discitis, or aggressive bone lesion. 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 Conus medullaris and cauda equina: Conus extends to the T12-L1 level. Conus and cauda equina appear normal. Paraspinal and other soft tissues: No acute paraspinal abnormality. Postsurgical changes in the posterior paraspinal soft tissues at L4-S1. Disc levels: Disc spaces: Posterior lumbar interbody fusion from L4 through S1. T12-L1: No significant disc bulge. No neural foraminal stenosis. No central canal stenosis. L1-L2: No significant disc bulge. No neural foraminal stenosis. No central canal stenosis. L2-L3: Minimal broad-based disc bulge. No left foraminal stenosis. Mild right foraminal stenosis. No spinal stenosis. L3-L4: Broad-based disc bulge flattening ventral thecal sac. Mild bilateral facet arthropathy. Mild bilateral foraminal stenosis. Mild spinal stenosis. Bilateral lateral recess stenosis. L4-L5: Interbody fusion.   No foraminal or central canal stenosis. L5-S1: Interbody fusion.  No foraminal or central canal stenosis. IMPRESSION: 1. At L3-4 there is a broad-based disc bulge flattening ventral thecal sac. Mild bilateral facet arthropathy. Mild bilateral foraminal stenosis. Mild spinal stenosis. Bilateral lateral recess stenosis. 2. Posterior lumbar interbody fusion from L4 through S1 without foraminal or central canal stenosis. 3. No acute osseous injury of the lumbar spine. Electronically Signed   By: Kathreen Devoid M.D.   On: 05/22/2022 10:20   MR Thoracic Spine w/o contrast  Result Date: 05/22/2022 CLINICAL DATA:  Acute thoracic myelopathy. Low back pain for 2 years. EXAM: MRI THORACIC SPINE WITHOUT CONTRAST TECHNIQUE: Multiplanar, multisequence MR imaging of the thoracic spine was performed. No intravenous contrast was administered. COMPARISON:  None Available. FINDINGS: Alignment:  Physiologic. Vertebrae: No acute fracture, evidence of discitis, or aggressive bone lesion. Vertebral body hemangioma is in the T4, T5, T7, T11 and L1 vertebral bodies. Cord:  Normal signal and morphology. Paraspinal and other soft tissues: No acute paraspinal abnormality. Disc levels: Disc spaces:  Disc spaces are maintained. T1-T2: No disc protrusion, foraminal stenosis or central canal stenosis. T2-T3: No disc protrusion, foraminal stenosis or central canal stenosis. T3-T4: Tiny central disc protrusion. No foraminal or central canal stenosis. T4-T5: No disc protrusion, foraminal stenosis or central canal stenosis. T5-T6: No disc protrusion, foraminal stenosis or central canal stenosis. T6-T7: No disc protrusion, foraminal stenosis or central canal stenosis. T7-T8: No disc protrusion, foraminal stenosis or central canal stenosis. T8-T9: No disc protrusion, foraminal stenosis or central canal stenosis. T9-T10: No disc protrusion, foraminal stenosis or central canal stenosis. T10-T11: No disc protrusion, foraminal stenosis or central canal  stenosis. T11-T12: No disc protrusion, foraminal stenosis or central canal stenosis. IMPRESSION: 1. No acute osseous injury of the thoracic spine. 2. At T3-4 there is a tiny central disc protrusion. 3. Otherwise no significant thoracic spine disc protrusion, foraminal stenosis or central canal stenosis. Electronically Signed   By: Kathreen Devoid M.D.   On: 05/22/2022 09:58  Assessment & Plan:   Problem List Items Addressed This Visit       Cardiovascular and Mediastinum   Coronary atherosclerosis - Primary    Agaston score is 6 Fam h/o CAD - F, S Start baby ASA, Krill oil, Vit D3+K2 Pt stopped Crestor Cardiol ref      Relevant Orders   Ambulatory referral to Cardiology     Other   Low vitamin B12 level    On B comlex      Low back pain    Start Vit D+K Oxy Rx Pregabaline      Relevant Medications   Oxycodone HCl 10 MG TABS   Oxycodone HCl 10 MG TABS   Chest pain, atypical    Agaston score is 6 Fam h/o CAD - F, S Start baby ASA, Krill oil, Vit D3+K2 Pt stopped Crestor Cardiol ref      Relevant Orders   Ambulatory referral to Cardiology   Other Visit Diagnoses     Other fatigue       Relevant Orders   Comprehensive metabolic panel   Testosterone   TSH         Meds ordered this encounter  Medications   DISCONTD: pregabalin (LYRICA) 150 MG capsule    Sig: Take 1 capsule (150 mg total) by mouth 2 (two) times daily.    Dispense:  60 capsule    Refill:  5   Vitamin D-Vitamin K (K2 PLUS D3) [id number] MCG-UNIT TABS    Sig: 1 po daily    Dispense:  100 tablet    Refill:  3   DISCONTD: Oxycodone HCl 10 MG TABS    Sig: Take 1 tablet (10 mg total) by mouth 3 (three) times daily as needed.    Dispense:  90 tablet    Refill:  0    Please fill on or after 10/11/22   DISCONTD: Oxycodone HCl 10 MG TABS    Sig: Take 1 tablet (10 mg total) by mouth 3 (three) times daily. Please fill on or after 03/13/22    Dispense:  90 tablet    Refill:  0    Please fill on or  after 09/11/22   Oxycodone HCl 10 MG TABS    Sig: Take 1 tablet (10 mg total) by mouth 3 (three) times daily as needed.    Dispense:  90 tablet    Refill:  0    Please fill on or after 10/11/22   Oxycodone HCl 10 MG TABS    Sig: Take 1 tablet (10 mg total) by mouth 3 (three) times daily. Please fill on or after 03/13/22    Dispense:  90 tablet    Refill:  0    Please fill on or after 09/11/22      Follow-up: Return in about 2 months (around 11/11/2022) for a follow-up visit.  Walker Kehr, MD

## 2022-09-11 NOTE — Assessment & Plan Note (Signed)
Agaston score is 6 Fam h/o CAD - F, S Start baby ASA, Krill oil, Vit D3+K2 Pt stopped Crestor Cardiol ref

## 2022-09-11 NOTE — Assessment & Plan Note (Signed)
On B comlex 

## 2022-09-11 NOTE — Assessment & Plan Note (Addendum)
Start Vit D+K Oxy Rx Pregabaline

## 2022-09-14 ENCOUNTER — Other Ambulatory Visit (HOSPITAL_COMMUNITY): Payer: Self-pay

## 2022-09-15 ENCOUNTER — Encounter: Payer: Self-pay | Admitting: Internal Medicine

## 2022-09-17 ENCOUNTER — Other Ambulatory Visit (HOSPITAL_COMMUNITY): Payer: Self-pay

## 2022-09-19 ENCOUNTER — Encounter: Payer: Self-pay | Admitting: Gastroenterology

## 2022-09-19 ENCOUNTER — Other Ambulatory Visit: Payer: Self-pay | Admitting: Internal Medicine

## 2022-09-19 DIAGNOSIS — E291 Testicular hypofunction: Secondary | ICD-10-CM | POA: Insufficient documentation

## 2022-09-19 MED ORDER — "BD DISP NEEDLE 23G X 1"" MISC": 3 refills | Status: AC

## 2022-09-19 MED ORDER — TESTOSTERONE CYPIONATE 200 MG/ML IM SOLN: 200.0000 mg | INTRAMUSCULAR | 5 refills | Status: DC

## 2022-09-19 NOTE — Telephone Encounter (Signed)
This pt now sees Dr Bryan Lemma

## 2022-09-26 DIAGNOSIS — K219 Gastro-esophageal reflux disease without esophagitis: Secondary | ICD-10-CM | POA: Diagnosis not present

## 2022-09-26 DIAGNOSIS — R030 Elevated blood-pressure reading, without diagnosis of hypertension: Secondary | ICD-10-CM | POA: Diagnosis not present

## 2022-09-26 DIAGNOSIS — Z79891 Long term (current) use of opiate analgesic: Secondary | ICD-10-CM | POA: Diagnosis not present

## 2022-09-26 DIAGNOSIS — K581 Irritable bowel syndrome with constipation: Secondary | ICD-10-CM | POA: Diagnosis not present

## 2022-09-26 DIAGNOSIS — Z87892 Personal history of anaphylaxis: Secondary | ICD-10-CM | POA: Diagnosis not present

## 2022-09-26 DIAGNOSIS — E669 Obesity, unspecified: Secondary | ICD-10-CM | POA: Diagnosis not present

## 2022-09-26 DIAGNOSIS — Z9181 History of falling: Secondary | ICD-10-CM | POA: Diagnosis not present

## 2022-09-26 DIAGNOSIS — N400 Enlarged prostate without lower urinary tract symptoms: Secondary | ICD-10-CM | POA: Diagnosis not present

## 2022-09-26 DIAGNOSIS — Z888 Allergy status to other drugs, medicaments and biological substances status: Secondary | ICD-10-CM | POA: Diagnosis not present

## 2022-09-26 DIAGNOSIS — E785 Hyperlipidemia, unspecified: Secondary | ICD-10-CM | POA: Diagnosis not present

## 2022-09-26 DIAGNOSIS — E291 Testicular hypofunction: Secondary | ICD-10-CM | POA: Diagnosis not present

## 2022-09-26 DIAGNOSIS — J45909 Unspecified asthma, uncomplicated: Secondary | ICD-10-CM | POA: Diagnosis not present

## 2022-10-04 NOTE — Telephone Encounter (Signed)
[facility] order form and insurance faxed to [place] (P: [phone], F: [phone]). SIBO kit placed at 2nd floor receptionist desk. Patient notified via [place].

## 2022-10-04 NOTE — Telephone Encounter (Signed)
Ok to set up for breath testing for SIBO/IMO. Will watch for results and if negative, will schedule OV to continue going through sxs and additional diagnostic/treatment options as appropriate.

## 2022-10-04 NOTE — Progress Notes (Signed)
Referring-Aleksei Plotnikov MD Reason for referral-coronary calcification and chest pain  HPI: 47 year old male for evaluation of coronary calcification and chest at request of Jacinta Shoe MD.  Calcium score September 2023 6 which was 77 percentile.  Patient states that he has an occasional sharp pain in his left chest lasting seconds.  Not related to activities.  2 days ago he had chest pressure when [place].  The pain did not radiate.  Resolved after stopping.  No nausea or diaphoresis.  He has dyspnea with more vigorous activities but not routine activities.  No orthopnea, PND, pedal edema.  Cardiology now asked to evaluate.  Note he has a strong family history of coronary disease.  Current Outpatient Medications  Medication Sig Dispense Refill   albuterol (PROAIR HFA) 108 (90 Base) MCG/ACT inhaler Inhale 2 puffs into the lungs every 6 (six) hours as needed for wheezing or shortness of breath. 3 each 1   Docusate Calcium (STOOL SOFTENER PO) Take 1 tablet by mouth daily.     esomeprazole (NEXIUM) 40 MG capsule Take 1 capsule (40 mg total) by mouth 2 (two) times daily before a meal. 60 capsule 6   fluticasone (FLONASE) 50 MCG/ACT nasal spray Place 2 sprays into both nostrils daily. 16 g 6   hyoscyamine (LEVSIN SL) 0.125 MG SL tablet Place 1 tablet (0.125 mg total) under the tongue every 6 (six) hours as needed. 30 tablet 2   Lactulose 20 GM/30ML SOLN Take 30-60 mLs (20-40 g total) by mouth 2 (two) times daily as needed (severe constipation). 1000 mL 3   methylcellulose (CITRUCEL) oral powder Take 1 packet by mouth daily.     naproxen (NAPROSYN) 500 MG tablet Take 1 tablet (500 mg total) by mouth 2 (two) times daily as needed for moderate pain. 180 tablet 0   NEEDLE, DISP, 23 G (BD DISP NEEDLE) 23G X 1" MISC Use for intramuscular injections. 50 each 3   ondansetron (ZOFRAN) 4 MG tablet Take 1 tablet (4 mg total) by mouth every 8 (eight) hours as needed for nausea  or vomiting. 20 tablet 1   Oxycodone HCl 10 MG TABS Take 1 tablet (10 mg total) by mouth 3 (three) times daily as needed. 90 tablet 0   pregabalin (LYRICA) 150 MG capsule Take 150 mg by mouth 2 (two) times daily.     rosuvastatin (CRESTOR) 5 MG tablet Take 5 mg by mouth daily.     tamsulosin (FLOMAX) 0.4 MG CAPS capsule Take 1 capsule (0.4 mg total) by mouth daily. 90 capsule 3   testosterone cypionate (DEPOTESTOSTERONE CYPIONATE) 200 MG/ML injection Inject 1 mL (200 mg total) into the muscle every 14 (fourteen) days. 10 mL 5   tiZANidine (ZANAFLEX) 4 MG capsule Take 1 capsule (4 mg total) by mouth 3 (three) times daily. 180 capsule 0   Vitamin D-Vitamin K (K2 PLUS D3) [phone] MCG-UNIT TABS 1 po daily 100 tablet 3   No current facility-administered medications for this visit.    Allergies  Allergen Reactions   Cymbalta [Duloxetine Hcl]     Nausea   Gabapentin     Twitching muscles     Past Medical History:  Diagnosis Date   Arthritis    Chronic back pain    Family history of colon cancer    sister   GERD (gastroesophageal reflux disease)    History of kidney stones    Hyperlipidemia    Hypertension     Past Surgical History:  Procedure Laterality Date   BACK SURGERY  04/11/2020   CHOLECYSTECTOMY N/A 10/01/2020   Procedure: LAPAROSCOPIC CHOLECYSTECTOMY;  Surgeon: Griselda Miner, MD;  Location: [facility];  Service: General;  Laterality: N/A;   COLONOSCOPY  1991   IBS   endocolon  09/15/2021   [facility], GERD   HAND SURGERY Bilateral    SHOULDER SURGERY Right 08/2021   SPINAL CORD STIMULATOR IMPLANT  2022   SPINAL CORD STIMULATOR REMOVAL  2022   TONSILLECTOMY     VASECTOMY      Social History   Socioeconomic History   Marital status: Married    Spouse name: Not on file   Number of children: 2   Years of education: Not on file   Highest education level: Not on file  Occupational History   Not on file  Tobacco Use   Smoking status: Never   Smokeless tobacco: Never   Vaping Use   Vaping Use: Never used  Substance and Sexual Activity   Alcohol use: Not Currently    Comment: States quit around age 55   Drug use: Never   Sexual activity: Not Currently    Birth control/protection: Surgical    Comment: vastectomy  Other Topics Concern   Not on file  Social History Narrative   Not on file   Social Determinants of Health   Financial Resource Strain: Not on file  Food Insecurity: Not on file  Transportation Needs: Not on file  Physical Activity: Not on file  Stress: Not on file  Social Connections: Not on file  Intimate Partner Violence: Not on file    Family History  Problem Relation Age of Onset   Heart disease Father 57       MI   Hypertension Father    Heart attack Father    Other Father        Small intestine removed but does not know why   Diabetes Sister    Heart disease Sister 23       MI   Colon cancer Sister    Colon polyps Sister    Colon polyps Sister    Healthy Sister    Healthy Sister    Healthy Sister    Other Brother        MVA   Healthy Brother    Healthy Brother    Healthy Brother    Healthy Brother    Healthy Brother    Healthy Brother    Liver disease Neg Hx    Pancreatic cancer Neg Hx    Esophageal cancer Neg Hx    Stomach cancer Neg Hx     ROS: no fevers or chills, productive cough, hemoptysis, dysphasia, odynophagia, melena, hematochezia, dysuria, hematuria, rash, seizure activity, orthopnea, PND, pedal edema, claudication. Remaining systems are negative.  Physical Exam:   Blood pressure 120/80, pulse 97, height  (1.727 m), weight 200 lb (90.7 kg), SpO2 98 %.  General:  Well developed/well nourished in NAD Skin warm/dry Patient not depressed No peripheral clubbing Back-normal HEENT-normal/normal eyelids Neck supple/normal carotid upstroke bilaterally; no bruits; no JVD; no thyromegaly chest - CTA/ normal expansion CV - RRR/normal S1 and S2; no murmurs, rubs or gallops;  PMI  nondisplaced Abdomen -NT/ND, no HSM, no mass, + bowel sounds, no bruit 2+ femoral pulses, no bruits Ext-no edema, chords, 2+ DP Neuro-grossly nonfocal  ECG -normal sinus rhythm at a rate of 97, normal axis, nonspecific ST changes.  Personally reviewed  A/P  1 coronary calcification-patient has  a mildly elevated calcium score.  However he has a significant family history of coronary disease.  I would like to be aggressive to prevent progression.  We will treat with aspirin 81 mg daily.  Add Crestor 40 mg daily.  2 chest pain-patient describes occasional sharp atypical chest pain.  However he had a more significant episode 2 days ago.  Electrocardiogram shows nonspecific ST changes.  I will arrange a cardiac CTA to rule out obstructive coronary disease.  He also has some dyspnea on exertion.  We will arrange an echocardiogram to assess LV function.  3 hyperlipidemia-given coronary calcification we will treat with Crestor 40 mg daily.  Check lipids and liver in 8 weeks.  Olga Millers, MD

## 2022-10-11 ENCOUNTER — Ambulatory Visit: Payer: Medicare HMO | Attending: Cardiology | Admitting: Cardiology

## 2022-10-11 ENCOUNTER — Encounter: Payer: Self-pay | Admitting: Cardiology

## 2022-10-11 VITALS — BP 120/80 | HR 97 | Ht 68.0 in | Wt 200.0 lb

## 2022-10-11 DIAGNOSIS — R072 Precordial pain: Secondary | ICD-10-CM | POA: Diagnosis not present

## 2022-10-11 DIAGNOSIS — I2510 Atherosclerotic heart disease of native coronary artery without angina pectoris: Secondary | ICD-10-CM | POA: Diagnosis not present

## 2022-10-11 DIAGNOSIS — R079 Chest pain, unspecified: Secondary | ICD-10-CM | POA: Diagnosis not present

## 2022-10-11 DIAGNOSIS — E7800 Pure hypercholesterolemia, unspecified: Secondary | ICD-10-CM

## 2022-10-11 MED ORDER — METOPROLOL TARTRATE 100 MG PO TABS: ORAL_TABLET | ORAL | 0 refills | Status: DC

## 2022-10-11 MED ORDER — ROSUVASTATIN CALCIUM 40 MG PO TABS
40.0000 mg | ORAL_TABLET | Freq: Every day | ORAL | 3 refills | Status: DC
Start: 2022-10-11 — End: 2023-02-11

## 2022-10-11 MED ORDER — ASPIRIN 81 MG PO TBEC: 81.0000 mg | DELAYED_RELEASE_TABLET | Freq: Every day | ORAL | 3 refills | Status: DC

## 2022-10-11 NOTE — Patient Instructions (Addendum)
Medication Instructions:   START ROSUVASTATIN 40 MG ONCE DAILY  START ASPIRIN 81 MG ONCE DAILY  *If you need a refill on your cardiac medications before your next appointment, please call your pharmacy*   Lab Work:  Your physician recommends that you return for lab work in: [facility]  If you have labs (blood work) drawn today and your tests are completely normal, you will receive your results only by: MyChart Message (if you have MyChart) OR A paper copy in the mail If you have any lab test that is abnormal or we need to change your treatment, we will call you to review the results.   Testing/Procedures:  Your physician has requested that you have an echocardiogram. Echocardiography is a painless test that uses sound waves to create images of your heart. It provides your doctor with information about the size and shape of your heart and how well your heart's chambers and valves are working. This procedure takes approximately one hour. There are no restrictions for this procedure. Please do NOT wear cologne, perfume, aftershave, or lotions (deodorant is allowed). Please arrive 15 minutes prior to your appointment time. [address]    Your cardiac CT will be scheduled at   [facility] [address] [place], [place] [address] [phone]    If scheduled at [facility], please arrive at the [facility] (Entrance C2) of [facility] 30 minutes prior to test start time. You can use the FREE valet parking offered at entrance C (encouraged to control the heart rate for the test)  Proceed to the [facility] Radiology Department (first floor) to check-in and test prep.  All radiology patients and guests should use entrance C2 at [facility], accessed from [facility], even though the hospital's physical address listed is [address].    If scheduled at [facility] or [facility], please arrive 15 mins early for check-in and test prep.   Please follow these instructions carefully (unless otherwise directed):  Hold all erectile dysfunction medications at least 3 days (72 hrs) prior to test. (Ie viagra, cialis, sildenafil, tadalafil, etc) We will administer nitroglycerin during this exam.   On the Night Before the Test: Be sure to Drink plenty of water. Do not consume any caffeinated/decaffeinated beverages or chocolate 12 hours prior to your test. Do not take any antihistamines 12 hours prior to your test.   On the Day of the Test: Drink plenty of water until 1 hour prior to the test. Do not eat any food 1 hour prior to test. You may take your regular medications prior to the test.  Take metoprolol (Lopressor) 100 MG two hours prior to test.       After the Test: Drink plenty of water. After receiving IV contrast, you may experience a mild flushed feeling. This is normal. On occasion, you may experience a mild rash up to 24 hours after the test. This is not dangerous. If this occurs, you can take Benadryl 25 mg and increase your fluid intake. If you experience trouble breathing, this can be serious. If it is severe call 911 IMMEDIATELY. If it is mild, please call our office.   We will call to schedule your test 2-4 weeks out understanding that some insurance companies will need an authorization prior to the service being performed.   For non-scheduling related questions, please contact the cardiac imaging nurse navigator should  you have any questions/concerns: Rockwell Alexandria, Cardiac Imaging Nurse Navigator Larey Brick, Cardiac Imaging Nurse Navigator [facility] Direct Office Dial: [phone]   For scheduling needs, including cancellations and rescheduling, please call [place], [phone].    Follow-Up: At [facility], you and your health needs are our priority.  As  part of our continuing mission to provide you with exceptional heart care, we have created designated Provider Care Teams.  These Care Teams include your primary Cardiologist (physician) and Advanced Practice Providers (APPs -  Physician Assistants and Nurse Practitioners) who all work together to provide you with the care you need, when you need it.  We recommend signing up for the patient portal called "MyChart".  Sign up information is provided on this After Visit Summary.  MyChart is used to connect with patients for Virtual Visits (Telemedicine).  Patients are able to view lab/test results, encounter notes, upcoming appointments, etc.  Non-urgent messages can be sent to your provider as well.   To learn more about what you can do with MyChart, go to [email or web address].    Your next appointment:   6 month(s)  Provider:   Olga Millers MD

## 2022-10-12 ENCOUNTER — Ambulatory Visit (HOSPITAL_COMMUNITY): Payer: Medicare HMO | Attending: Cardiovascular Disease

## 2022-10-12 DIAGNOSIS — R072 Precordial pain: Secondary | ICD-10-CM | POA: Insufficient documentation

## 2022-10-12 DIAGNOSIS — I2510 Atherosclerotic heart disease of native coronary artery without angina pectoris: Secondary | ICD-10-CM | POA: Diagnosis not present

## 2022-10-12 LAB — ECHOCARDIOGRAM COMPLETE
Area-P 1/2: 4.86 cm2
S' Lateral: 3.1 cm

## 2022-10-15 ENCOUNTER — Encounter: Payer: Self-pay | Admitting: Cardiology

## 2022-10-16 ENCOUNTER — Telehealth (HOSPITAL_COMMUNITY): Payer: Self-pay | Admitting: *Deleted

## 2022-10-16 NOTE — Telephone Encounter (Signed)
Reaching out to patient to offer assistance regarding upcoming cardiac imaging study; pt verbalizes understanding of appt date/time, parking situation and where to check in, pre-test NPO status and medications ordered, and verified current allergies; name and call back number provided for further questions should they arise  Lindon Kiel RN Navigator Cardiac Imaging [facility] [phone] office [phone] cell  Patient to take 100mg metoprolol tartrate two hours prior to his cardiac CT scan. He is aware to arrive at 11am. 

## 2022-10-17 ENCOUNTER — Ambulatory Visit (HOSPITAL_COMMUNITY)
Admission: RE | Admit: 2022-10-17 | Discharge: 2022-10-17 | Disposition: A | Discharge status: Home / Self Care | Payer: Medicare HMO | Source: Ambulatory Visit | Attending: Cardiology | Admitting: Cardiology

## 2022-10-17 DIAGNOSIS — I2510 Atherosclerotic heart disease of native coronary artery without angina pectoris: Secondary | ICD-10-CM | POA: Insufficient documentation

## 2022-10-17 DIAGNOSIS — R072 Precordial pain: Secondary | ICD-10-CM | POA: Diagnosis not present

## 2022-10-17 MED ORDER — NITROGLYCERIN 0.4 MG SL SUBL
0.8000 mg | SUBLINGUAL_TABLET | Freq: Once | SUBLINGUAL | Status: AC
  Administered 2022-10-17: 0.8 mg via SUBLINGUAL

## 2022-10-17 MED ORDER — NITROGLYCERIN 0.4 MG SL SUBL
SUBLINGUAL_TABLET | SUBLINGUAL | Status: AC
  Filled 2022-10-17: qty 2

## 2022-10-17 MED ORDER — IOHEXOL 350 MG/ML SOLN
100.0000 mL | Freq: Once | INTRAVENOUS | Status: AC | PRN
  Administered 2022-10-17: 100 mL via INTRAVENOUS

## 2022-10-24 ENCOUNTER — Encounter: Payer: Self-pay | Admitting: Orthopedic Surgery

## 2022-10-24 ENCOUNTER — Encounter: Payer: Self-pay | Admitting: Cardiology

## 2022-10-24 DIAGNOSIS — M5416 Radiculopathy, lumbar region: Secondary | ICD-10-CM

## 2022-10-29 ENCOUNTER — Other Ambulatory Visit: Payer: Self-pay

## 2022-10-29 ENCOUNTER — Other Ambulatory Visit (INDEPENDENT_AMBULATORY_CARE_PROVIDER_SITE_OTHER): Payer: Medicare HMO

## 2022-10-29 ENCOUNTER — Ambulatory Visit: Payer: Medicare HMO | Admitting: Surgical

## 2022-10-29 DIAGNOSIS — M542 Cervicalgia: Secondary | ICD-10-CM | POA: Diagnosis not present

## 2022-10-29 DIAGNOSIS — M19011 Primary osteoarthritis, right shoulder: Secondary | ICD-10-CM | POA: Diagnosis not present

## 2022-11-05 ENCOUNTER — Other Ambulatory Visit: Payer: Self-pay | Admitting: Internal Medicine

## 2022-11-07 ENCOUNTER — Other Ambulatory Visit: Payer: Self-pay

## 2022-11-07 ENCOUNTER — Ambulatory Visit: Payer: Medicare HMO | Admitting: Physical Medicine and Rehabilitation

## 2022-11-07 VITALS — BP 139/83 | HR 71

## 2022-11-07 DIAGNOSIS — M5416 Radiculopathy, lumbar region: Secondary | ICD-10-CM | POA: Diagnosis not present

## 2022-11-07 DIAGNOSIS — M961 Postlaminectomy syndrome, not elsewhere classified: Secondary | ICD-10-CM | POA: Diagnosis not present

## 2022-11-07 MED ORDER — METHYLPREDNISOLONE ACETATE 80 MG/ML IJ SUSP
80.0000 mg | Freq: Once | INTRAMUSCULAR | Status: AC
  Administered 2022-11-07: 80 mg

## 2022-11-07 NOTE — Patient Instructions (Signed)

## 2022-11-07 NOTE — Progress Notes (Signed)
TABER PAPENFUSS - 47 y.o. male MRN [id number]  Date of birth: [birth year]  Office Visit Note: Visit Date: 11/07/2022 PCP: Tresa Garter, MD Referred by: London Sheer, MD  Subjective: Chief Complaint  Patient presents with   Lower Back - Pain   HPI:  VIGO TANSKI is a 47 y.o. male who comes in today at the request of Dr. Willia Craze for planned Bilateral L3-4 Lumbar Transforaminal epidural steroid injection with fluoroscopic guidance.  The patient has failed conservative care including home exercise, medications, time and activity modification.  This injection will be diagnostic and hopefully therapeutic.  Please see requesting physician notes for further details and justification.  His history is that he has had prior lumbar fusion at L4-5 and L5-S1 by Dr. Dutch Quint.  He has back pain with some thigh pain occasional pain in the scrotum.  He reports the biggest relief he has had in the past with caudal injections but they were painful but it seemed to help temporarily.  Had prior L3 transforaminal injection.  Not want to consider pelvic MRI.   ROS Otherwise per HPI.  Assessment & Plan: Visit Diagnoses:    ICD-10-CM   1. Radiculopathy, lumbar region  M54.16 XR C-ARM NO REPORT    Epidural Steroid injection    methylPREDNISolone acetate (DEPO-MEDROL) injection 80 mg    2. Post laminectomy syndrome  M96.1 XR C-ARM NO REPORT    Epidural Steroid injection    methylPREDNISolone acetate (DEPO-MEDROL) injection 80 mg      Plan: No additional findings.   Meds & Orders:  Meds ordered this encounter  Medications   methylPREDNISolone acetate (DEPO-MEDROL) injection 80 mg    Orders Placed This Encounter  Procedures   XR C-ARM NO REPORT   Epidural Steroid injection    Follow-up: Return for visit to requesting provider as needed.   Procedures: No procedures performed  Lumbosacral Transforaminal Epidural Steroid Injection - Sub-Pedicular Approach with Fluoroscopic  Guidance  Patient: THURMAN LAUMANN      Date of Birth: 1976-05-25 MRN: [id number] PCP: Tresa Garter, MD      Visit Date: 11/07/2022   Universal Protocol:    Date/Time: 11/07/2022  Consent Given By: the patient  Position: PRONE  Additional Comments: Vital signs were monitored before and after the procedure. Patient was prepped and draped in the usual sterile fashion. The correct patient, procedure, and site was verified.   Injection Procedure Details:   Procedure diagnoses: Radiculopathy, lumbar region [M54.16]    Meds Administered:  Meds ordered this encounter  Medications   methylPREDNISolone acetate (DEPO-MEDROL) injection 80 mg    Laterality: Bilateral  Location/Site: L3  Needle:5.0 in., 22 ga.  Short bevel or Quincke spinal needle  Needle Placement: Transforaminal  Findings:    -Comments: Excellent flow of contrast along the nerve, nerve root and into the epidural space.  Procedure Details: After squaring off the end-plates to get a true AP view, the C-arm was positioned so that an oblique view of the foramen as noted above was visualized. The target area is just inferior to the "nose of the scotty dog" or sub pedicular. The soft tissues overlying this structure were infiltrated with 2-3 ml. of 1% Lidocaine without Epinephrine.  The spinal needle was inserted toward the target using a "trajectory" view along the fluoroscope beam.  Under AP and lateral visualization, the needle was advanced so it did not puncture dura and was located close the 6 O'Clock position of the pedical in  AP tracterory. Biplanar projections were used to confirm position. Aspiration was confirmed to be negative for CSF and/or blood. A 1-2 ml. volume of Isovue-250 was injected and flow of contrast was noted at each level. Radiographs were obtained for documentation purposes.   After attaining the desired flow of contrast documented above, a 0.5 to 1.0 ml test dose of 0.25% Marcaine  was injected into each respective transforaminal space.  The patient was observed for 90 seconds post injection.  After no sensory deficits were reported, and normal lower extremity motor function was noted,   the above injectate was administered so that equal amounts of the injectate were placed at each foramen (level) into the transforaminal epidural space.   Additional Comments:  No complications occurred Dressing: 2 x 2 sterile gauze and Band-Aid    Post-procedure details: Patient was observed during the procedure. Post-procedure instructions were reviewed.  Patient left the clinic in stable condition.    Clinical History: EXAM: MRI LUMBAR SPINE WITHOUT CONTRAST   TECHNIQUE: Multiplanar, multisequence MR imaging of the lumbar spine was performed. No intravenous contrast was administered.   COMPARISON:  01/18/2021   FINDINGS: Segmentation:  Standard.   Alignment:  2 mm retrolisthesis of L3 on L4.   Vertebrae: No acute fracture, evidence of discitis, or aggressive bone lesion. 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   Conus medullaris and cauda equina: Conus extends to the T12-L1 level. Conus and cauda equina appear normal.   Paraspinal and other soft tissues: No acute paraspinal abnormality. Postsurgical changes in the posterior paraspinal soft tissues at L4-S1.   Disc levels:   Disc spaces: Posterior lumbar interbody fusion from L4 through S1.   T12-L1: No significant disc bulge. No neural foraminal stenosis. No central canal stenosis.   L1-L2: No significant disc bulge. No neural foraminal stenosis. No central canal stenosis.   L2-L3: Minimal broad-based disc bulge. No left foraminal stenosis. Mild right foraminal stenosis. No spinal stenosis.   L3-L4: Broad-based disc bulge flattening ventral thecal sac. Mild bilateral facet arthropathy. Mild bilateral foraminal stenosis. Mild spinal stenosis. Bilateral lateral recess stenosis.   L4-L5:  Interbody fusion.  No foraminal or central canal stenosis.   L5-S1: Interbody fusion.  No foraminal or central canal stenosis.   IMPRESSION: 1. At L3-4 there is a broad-based disc bulge flattening ventral thecal sac. Mild bilateral facet arthropathy. Mild bilateral foraminal stenosis. Mild spinal stenosis. Bilateral lateral recess stenosis. 2. Posterior lumbar interbody fusion from L4 through S1 without foraminal or central canal stenosis. 3. No acute osseous injury of the lumbar spine.     Electronically Signed   By: Elige Ko M.D.   On: 05/22/2022 10:20     Objective:  VS:  HT:    WT:   BMI:     BP:139/83  HR:71bpm  TEMP: ( )  RESP:  Physical Exam Vitals and nursing note reviewed.  Constitutional:      General: He is not in acute distress.    Appearance: Normal appearance. He is not ill-appearing.  HENT:     Head: Normocephalic and atraumatic.     Right Ear: External ear normal.     Left Ear: External ear normal.     Nose: No congestion.  Eyes:     Extraocular Movements: Extraocular movements intact.  Cardiovascular:     Rate and Rhythm: Normal rate.     Pulses: Normal pulses.  Pulmonary:     Effort: Pulmonary effort is normal. No respiratory distress.  Abdominal:     General: There is no distension.     Palpations: Abdomen is soft.  Musculoskeletal:        General: No tenderness or signs of injury.     Cervical back: Neck supple.     Right lower leg: No edema.     Left lower leg: No edema.     Comments: Patient has good distal strength without clonus.  Skin:    Findings: No erythema or rash.  Neurological:     General: No focal deficit present.     Mental Status: He is alert and oriented to person, place, and time.     Sensory: No sensory deficit.     Motor: No weakness or abnormal muscle tone.     Coordination: Coordination normal.  Psychiatric:        Mood and Affect: Mood normal.        Behavior: Behavior normal.      Imaging: No results  found.

## 2022-11-07 NOTE — Progress Notes (Signed)
Functional Pain Scale - descriptive words and definitions  Distracting (5)    Aware of pain/able to complete some ADL's but limited by pain/sleep is affected and active distractions are only slightly useful. Moderate range order  Average Pain 7   +Driver, -BT, -Dye Allergies.  Lower back pain on both sides that radiates down both legs to the ankle. Medication, heat and ice helps pain

## 2022-11-07 NOTE — Procedures (Signed)
Lumbosacral Transforaminal Epidural Steroid Injection - Sub-Pedicular Approach with Fluoroscopic Guidance  Patient: Adam Vaughn      Date of Birth: 01/11/1976 MRN: [id number] PCP: Tresa Garter, MD      Visit Date: 11/07/2022   Universal Protocol:    Date/Time: 11/07/2022  Consent Given By: the patient  Position: PRONE  Additional Comments: Vital signs were monitored before and after the procedure. Patient was prepped and draped in the usual sterile fashion. The correct patient, procedure, and site was verified.   Injection Procedure Details:   Procedure diagnoses: Radiculopathy, lumbar region [M54.16]    Meds Administered:  Meds ordered this encounter  Medications   methylPREDNISolone acetate (DEPO-MEDROL) injection 80 mg    Laterality: Bilateral  Location/Site: L3  Needle:5.0 in., 22 ga.  Short bevel or Quincke spinal needle  Needle Placement: Transforaminal  Findings:    -Comments: Excellent flow of contrast along the nerve, nerve root and into the epidural space.  Procedure Details: After squaring off the end-plates to get a true AP view, the C-arm was positioned so that an oblique view of the foramen as noted above was visualized. The target area is just inferior to the "nose of the scotty dog" or sub pedicular. The soft tissues overlying this structure were infiltrated with 2-3 ml. of 1% Lidocaine without Epinephrine.  The spinal needle was inserted toward the target using a "trajectory" view along the fluoroscope beam.  Under AP and lateral visualization, the needle was advanced so it did not puncture dura and was located close the 6 O'Clock position of the pedical in AP tracterory. Biplanar projections were used to confirm position. Aspiration was confirmed to be negative for CSF and/or blood. A 1-2 ml. volume of Isovue-250 was injected and flow of contrast was noted at each level. Radiographs were obtained for documentation purposes.   After  attaining the desired flow of contrast documented above, a 0.5 to 1.0 ml test dose of 0.25% Marcaine was injected into each respective transforaminal space.  The patient was observed for 90 seconds post injection.  After no sensory deficits were reported, and normal lower extremity motor function was noted,   the above injectate was administered so that equal amounts of the injectate were placed at each foramen (level) into the transforaminal epidural space.   Additional Comments:  No complications occurred Dressing: 2 x 2 sterile gauze and Band-Aid    Post-procedure details: Patient was observed during the procedure. Post-procedure instructions were reviewed.  Patient left the clinic in stable condition.

## 2022-11-11 ENCOUNTER — Encounter: Payer: Self-pay | Admitting: Orthopedic Surgery

## 2022-11-11 MED ORDER — METHYLPREDNISOLONE ACETATE 40 MG/ML IJ SUSP
13.3300 mg | INTRAMUSCULAR | Status: AC | PRN
Start: 2022-10-29 — End: 2022-10-29
  Administered 2022-10-29: 13.33 mg via INTRA_ARTICULAR

## 2022-11-11 MED ORDER — LIDOCAINE HCL 1 % IJ SOLN
3.0000 mL | INTRAMUSCULAR | Status: AC | PRN
Start: 2022-10-29 — End: 2022-10-29
  Administered 2022-10-29: 3 mL

## 2022-11-11 MED ORDER — BUPIVACAINE HCL 0.25 % IJ SOLN
0.6600 mL | INTRAMUSCULAR | Status: AC | PRN
Start: 2022-10-29 — End: 2022-10-29
  Administered 2022-10-29: .66 mL via INTRA_ARTICULAR

## 2022-11-11 NOTE — Progress Notes (Signed)
Office Visit Note   Patient: Adam Vaughn           Date of Birth: 1976-02-20           MRN: [id number] Visit Date: 10/29/2022 Requested by: Tresa Garter, MD [address] [place],  [place] [address] PCP: Plotnikov, Georgina Quint, MD  Subjective: Chief Complaint  Patient presents with   Right Shoulder - Pain   Neck - Pain    HPI: Adam Vaughn is a 47 y.o. male who presents to the office reporting right shoulder pain.  Patient describes constant aching pain in the right shoulder.  States that it this is worse if he puts pressure on his elbow or lays on his left side.  Describes pain in the superior aspect of the shoulder constantly.  Pain is not excruciating but bothering him enough to seek care.  Does have some associated trapezius pain and radiation to the neck but no scapular pain.  No radiation of pain.  Does have some popping sensation in this region.  Pain is not really worse with range of motion of the shoulder that he has noticed.  Does have history of prior right shoulder bicep tenodesis that is doing well for him in regards to the bicep pain and spasms that he used to have which is no longer a problem.  He will get occasional numbness and tingling in the lateral humeral region.                ROS: All systems reviewed are negative as they relate to the chief complaint within the history of present illness.  Patient denies fevers or chills.  Assessment & Plan: Visit Diagnoses:  1. Arthritis of right acromioclavicular joint   2. Neck pain     Plan: Patient is a 47 year old male who presents for evaluation of right shoulder pain.  Mostly localizes the focal shoulder pain to the superior and lateral aspect of the shoulder.  May be having some referred pain from his neck as well based on the numbness and tingling and the trapezius/neck pain that he is experiencing but he also seems to have some focal tenderness overlying the [facility] joint today on exam which seems to be  the most concerning pain for him today.  He would like to try right shoulder AC joint injection.  This was administered under ultrasound guidance without complication.  Tolerated procedure well and we will see him back in 6 weeks for clinical recheck with Dr. August Saucer.  If he is still having some numbness and tingling in the mid humeral region and continued trapezius/neck pain, may consider MRI cervical spine at that point.  Follow-Up Instructions: No follow-ups on file.   Orders:  Orders Placed This Encounter  Procedures   XR Cervical Spine 2 or 3 views   US Guided Needle Placement - No Linked Charges   No orders of the defined types were placed in this encounter.     Procedures: Medium Joint Inj: R acromioclavicular on 10/29/2022 7:14 PM Indications: diagnostic evaluation and pain Details: 25 G 1.5 in needle, ultrasound-guided superior approach Medications: 3 mL lidocaine 1 %; 0.66 mL bupivacaine 0.25 %; 13.33 mg methylPREDNISolone acetate 40 MG/ML Outcome: tolerated well, no immediate complications Procedure, treatment alternatives, risks and benefits explained, specific risks discussed. Consent was given by the patient. Immediately prior to procedure a time out was called to verify the correct patient, procedure, equipment, support staff and site/side marked as required. Patient was prepped  and draped in the usual sterile fashion.       Clinical Data: No additional findings.  Objective: Vital Signs: There were no vitals taken for this visit.  Physical Exam:  Constitutional: Patient appears well-developed HEENT:  Head: Normocephalic Eyes:EOM are normal Neck: Normal range of motion Cardiovascular: Normal rate Pulmonary/chest: Effort normal Neurologic: Patient is alert Skin: Skin is warm Psychiatric: Patient has normal mood and affect  Ortho Exam: Ortho exam demonstrates right shoulder with 45 degrees external rotation, 110 degrees abduction, 180 degrees forward elevation  passively and actively.  This compared with the left shoulder with 40 degrees external rotation, 85 degrees abduction, 180 degrees forward elevation.  He has no tenderness over the bicipital groove of the right shoulder.  Axillary nerve intact with deltoid firing.  Excellent rotator cuff strength of supra, infra, subscap.  No weakness of EPL, FPL, grip strength, finger abduction, pronation/supination, bicep, tricep, deltoid.  He does have focal moderate tenderness overlying the AC joint with reproduction of pain with crossarm adduction.  Specialty Comments:  EXAM: MRI LUMBAR SPINE WITHOUT CONTRAST   TECHNIQUE: Multiplanar, multisequence MR imaging of the lumbar spine was performed. No intravenous contrast was administered.   COMPARISON:  01/18/2021   FINDINGS: Segmentation:  Standard.   Alignment:  2 mm retrolisthesis of L3 on L4.   Vertebrae: No acute fracture, evidence of discitis, or aggressive bone lesion. 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   Conus medullaris and cauda equina: Conus extends to the T12-L1 level. Conus and cauda equina appear normal.   Paraspinal and other soft tissues: No acute paraspinal abnormality. Postsurgical changes in the posterior paraspinal soft tissues at L4-S1.   Disc levels:   Disc spaces: Posterior lumbar interbody fusion from L4 through S1.   T12-L1: No significant disc bulge. No neural foraminal stenosis. No central canal stenosis.   L1-L2: No significant disc bulge. No neural foraminal stenosis. No central canal stenosis.   L2-L3: Minimal broad-based disc bulge. No left foraminal stenosis. Mild right foraminal stenosis. No spinal stenosis.   L3-L4: Broad-based disc bulge flattening ventral thecal sac. Mild bilateral facet arthropathy. Mild bilateral foraminal stenosis. Mild spinal stenosis. Bilateral lateral recess stenosis.   L4-L5: Interbody fusion.  No foraminal or central canal stenosis.   L5-S1: Interbody  fusion.  No foraminal or central canal stenosis.   IMPRESSION: 1. At L3-4 there is a broad-based disc bulge flattening ventral thecal sac. Mild bilateral facet arthropathy. Mild bilateral foraminal stenosis. Mild spinal stenosis. Bilateral lateral recess stenosis. 2. Posterior lumbar interbody fusion from L4 through S1 without foraminal or central canal stenosis. 3. No acute osseous injury of the lumbar spine.     Electronically Signed   By: Elige Ko M.D.   On: 05/22/2022 10:20  Imaging: No results found.   PMFS History: Patient Active Problem List   Diagnosis Date Noted   Hypogonadism male 09/19/2022   Chronic eczematous otitis externa of both ears 05/16/2022   Hypertrophy of inferior nasal turbinate 05/16/2022   Situational depression 05/14/2022   Coronary atherosclerosis 03/26/2022   Dyslipidemia 01/11/2022   Chronic pain syndrome 11/07/2021   Piriformis syndrome 11/07/2021   Bladder neck obstruction 09/04/2021   Asthmatic bronchitis 09/04/2021   Pain in right shoulder 06/29/2021   Meteorism 06/01/2021   Sinus congestion 02/28/2021   Vertigo 02/17/2021   Headache 02/17/2021   Fever 02/17/2021   Falls 02/17/2021   Allergic rhinitis 10/25/2020   Family history of acute polio 10/25/2020   Chest pain,  atypical 10/10/2020   S/P laparoscopic cholecystectomy 10/10/2020   Hyperglycemia 10/10/2020   Acute cholecystitis 09/30/2020   Insomnia 09/26/2020   Knee pain 06/23/2020   Low vitamin B12 level 05/31/2020   Ankle sprain 05/31/2020   Constipation 05/31/2020   Degenerative spondylolisthesis 04/11/2020   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   DDD (degenerative disc disease), lumbar 01/20/2020   Left lumbar radiculitis 01/20/2020   Vitamin D deficiency 01/15/2020   Low back pain 01/12/2020   IBS (irritable bowel syndrome) 01/12/2020   Nephrolithiasis 01/12/2020   Erectile dysfunction 01/12/2020   GERD (gastroesophageal  reflux disease) 01/12/2020   Past Medical History:  Diagnosis Date   Arthritis    Chronic back pain    Family history of colon cancer    sister   GERD (gastroesophageal reflux disease)    History of kidney stones    Hyperlipidemia    Hypertension     Family History  Problem Relation Age of Onset   Heart disease Father 37       MI   Hypertension Father    Heart attack Father    Other Father        Small intestine removed but does not know why   Diabetes Sister    Heart disease Sister 21       MI   Colon cancer Sister    Colon polyps Sister    Colon polyps Sister    Healthy Sister    Healthy Sister    Healthy Sister    Other Brother        MVA   Healthy Brother    Healthy Brother    Healthy Brother    Healthy Brother    Healthy Brother    Healthy Brother    Liver disease Neg Hx    Pancreatic cancer Neg Hx    Esophageal cancer Neg Hx    Stomach cancer Neg Hx     Past Surgical History:  Procedure Laterality Date   BACK SURGERY  04/11/2020   CHOLECYSTECTOMY N/A 10/01/2020   Procedure: LAPAROSCOPIC CHOLECYSTECTOMY;  Surgeon: Griselda Miner, MD;  Location: [facility];  Service: General;  Laterality: N/A;   COLONOSCOPY  1991   IBS   endocolon  09/15/2021   [facility], GERD   HAND SURGERY Bilateral    SHOULDER SURGERY Right 08/2021   SPINAL CORD STIMULATOR IMPLANT  2022   SPINAL CORD STIMULATOR REMOVAL  2022   TONSILLECTOMY     VASECTOMY     Social History   Occupational History   Not on file  Tobacco Use   Smoking status: Never   Smokeless tobacco: Never  Vaping Use   Vaping Use: Never used  Substance and Sexual Activity   Alcohol use: Not Currently    Comment: States quit around age 33   Drug use: Never   Sexual activity: Not Currently    Birth control/protection: Surgical    Comment: vastectomy

## 2022-11-12 ENCOUNTER — Ambulatory Visit (INDEPENDENT_AMBULATORY_CARE_PROVIDER_SITE_OTHER): Payer: Medicare HMO | Admitting: Internal Medicine

## 2022-11-12 ENCOUNTER — Encounter: Payer: Self-pay | Admitting: Internal Medicine

## 2022-11-12 VITALS — BP 120/78 | HR 76 | Temp 98.7°F | Ht 68.0 in | Wt 196.0 lb

## 2022-11-12 DIAGNOSIS — E291 Testicular hypofunction: Secondary | ICD-10-CM | POA: Diagnosis not present

## 2022-11-12 DIAGNOSIS — K5903 Drug induced constipation: Secondary | ICD-10-CM

## 2022-11-12 DIAGNOSIS — R7989 Other specified abnormal findings of blood chemistry: Secondary | ICD-10-CM | POA: Diagnosis not present

## 2022-11-12 DIAGNOSIS — M545 Low back pain, unspecified: Secondary | ICD-10-CM | POA: Diagnosis not present

## 2022-11-12 DIAGNOSIS — M79672 Pain in left foot: Secondary | ICD-10-CM

## 2022-11-12 DIAGNOSIS — G8929 Other chronic pain: Secondary | ICD-10-CM | POA: Diagnosis not present

## 2022-11-12 DIAGNOSIS — M79671 Pain in right foot: Secondary | ICD-10-CM | POA: Diagnosis not present

## 2022-11-12 LAB — COMPREHENSIVE METABOLIC PANEL
ALT: 67 U/L — ABNORMAL HIGH (ref 0–53)
AST: 27 U/L (ref 0–37)
Albumin: 4.6 g/dL (ref 3.5–5.2)
Alkaline Phosphatase: 87 U/L (ref 39–117)
BUN: 16 mg/dL (ref 6–23)
CO2: 27 mEq/L (ref 19–32)
Calcium: 9.8 mg/dL (ref 8.4–10.5)
Chloride: 103 mEq/L (ref 96–112)
Creatinine, Ser: 0.96 mg/dL (ref 0.40–1.50)
GFR: 94.33 mL/min (ref >60.00)
Glucose, Bld: 88 mg/dL (ref 70–99)
Potassium: 4.1 mEq/L (ref 3.5–5.1)
Sodium: 138 mEq/L (ref 135–145)
Total Bilirubin: 0.9 mg/dL (ref 0.2–1.2)
Total Protein: 7.3 g/dL (ref 6.0–8.3)

## 2022-11-12 LAB — TESTOSTERONE: Testosterone: 263.55 ng/dL — ABNORMAL LOW (ref 300.00–890.00)

## 2022-11-12 LAB — URIC ACID: Uric Acid, Serum: 3.5 mg/dL — ABNORMAL LOW (ref 4.0–7.8)

## 2022-11-12 MED ORDER — TRULANCE 3 MG PO TABS: 3.0000 mg | ORAL_TABLET | Freq: Every day | ORAL | 11 refills | Status: DC

## 2022-11-12 MED ORDER — ESOMEPRAZOLE MAGNESIUM 40 MG PO CPDR: 40.0000 mg | DELAYED_RELEASE_CAPSULE | Freq: Two times a day (BID) | ORAL | 11 refills | Status: DC

## 2022-11-12 MED ORDER — OXYCODONE HCL 10 MG PO TABS: 10.0000 mg | ORAL_TABLET | Freq: Three times a day (TID) | ORAL | 0 refills | Status: DC | PRN

## 2022-11-12 MED ORDER — PREGABALIN 150 MG PO CAPS: 150.0000 mg | ORAL_CAPSULE | Freq: Two times a day (BID) | ORAL | 5 refills | Status: DC

## 2022-11-12 NOTE — Assessment & Plan Note (Addendum)
Check Uric acid for pain in  L>>R MTP joints Check Uric acid

## 2022-11-12 NOTE — Progress Notes (Signed)
Subjective:  Patient ID: Adam Vaughn, male    DOB: [birth year]  Age: 47 y.o. MRN: [id number]  CC: No chief complaint on file.   HPI KEIGO SCHIMPF presents for LBP, hypogonadism, fatigue - better; GERD C/o pain in  L>>R MTP joints   Outpatient Medications Prior to Visit  Medication Sig Dispense Refill   aspirin EC 81 MG tablet Take 1 tablet (81 mg total) by mouth daily. Swallow whole. 90 tablet 3   Docusate Calcium (STOOL SOFTENER PO) Take 1 tablet by mouth daily.     fluticasone (FLONASE) 50 MCG/ACT nasal spray Place 2 sprays into both nostrils daily. 16 g 6   hyoscyamine (LEVSIN SL) 0.125 MG SL tablet DISSOLVE 1 TABLET UNDER THE TONGUE EVERY 6 HOURS AS NEEDED 30 tablet 0   methylcellulose (CITRUCEL) oral powder Take 1 packet by mouth daily.     metoprolol tartrate (LOPRESSOR) 100 MG tablet TAKE 2 HOURS PRIOR TO CT SCAN 1 tablet 0   naproxen (NAPROSYN) 500 MG tablet Take 1 tablet (500 mg total) by mouth 2 (two) times daily as needed for moderate pain. 180 tablet 0   NEEDLE, DISP, 23 G (BD DISP NEEDLE) 23G X 1" MISC Use for intramuscular injections. 50 each 3   ondansetron (ZOFRAN) 4 MG tablet Take 1 tablet (4 mg total) by mouth every 8 (eight) hours as needed for nausea or vomiting. 20 tablet 1   RELISTOR 150 MG TABS Take 1 tablet by mouth daily.     rosuvastatin (CRESTOR) 40 MG tablet Take 1 tablet (40 mg total) by mouth daily. 90 tablet 3   tamsulosin (FLOMAX) 0.4 MG CAPS capsule Take 1 capsule (0.4 mg total) by mouth daily. 90 capsule 3   testosterone cypionate (DEPOTESTOSTERONE CYPIONATE) 200 MG/ML injection Inject 1 mL (200 mg total) into the muscle every 14 (fourteen) days. 10 mL 5   tiZANidine (ZANAFLEX) 4 MG capsule Take 1 capsule (4 mg total) by mouth 3 (three) times daily. 180 capsule 0   Vitamin D-Vitamin K (K2 PLUS D3) [phone] MCG-UNIT TABS 1 po daily 100 tablet 3   esomeprazole (NEXIUM) 40 MG capsule Take 1 capsule (40 mg total) by mouth 2 (two) times daily  before a meal. 60 capsule 6   Oxycodone HCl 10 MG TABS Take 1 tablet (10 mg total) by mouth 3 (three) times daily as needed. 90 tablet 0   pregabalin (LYRICA) 150 MG capsule Take 150 mg by mouth 2 (two) times daily.     albuterol (PROAIR HFA) 108 (90 Base) MCG/ACT inhaler Inhale 2 puffs into the lungs every 6 (six) hours as needed for wheezing or shortness of breath. 3 each 1   Lactulose 20 GM/30ML SOLN Take 30-60 mLs (20-40 g total) by mouth 2 (two) times daily as needed (severe constipation). (Patient not taking: Reported on 11/12/2022) 1000 mL 3   No facility-administered medications prior to visit.    ROS: Review of Systems  Constitutional:  Negative for appetite change, fatigue and unexpected weight change.  HENT:  Negative for congestion, nosebleeds, sneezing, sore throat and trouble swallowing.   Eyes:  Negative for itching and visual disturbance.  Respiratory:  Negative for cough.   Cardiovascular:  Negative for chest pain, palpitations and leg swelling.  Gastrointestinal:  Negative for abdominal distention, blood in stool, diarrhea and nausea.  Genitourinary:  Negative for frequency and hematuria.  Musculoskeletal:  Positive for back pain. Negative for gait problem, joint swelling and neck pain.  Skin:  Negative  for rash.  Neurological:  Negative for dizziness, tremors, speech difficulty and weakness.  Psychiatric/Behavioral:  Negative for agitation, dysphoric mood and sleep disturbance. The patient is not nervous/anxious.   Pain in  L>>R MTP joints  Objective:  BP 120/78 (BP Location: Left Arm, Patient Position: Sitting, Cuff Size: Normal)   Pulse 76   Temp 98.7 F (37.1 C) (Oral)   Ht 5\' 8"  (1.727 m)   Wt 196 lb (88.9 kg)   SpO2 98%   BMI 29.80 kg/m   BP Readings from Last 3 Encounters:  11/12/22 120/78  11/07/22 139/83  10/17/22 107/66    Wt Readings from Last 3 Encounters:  11/12/22 196 lb (88.9 kg)  10/11/22 200 lb (90.7 kg)  09/11/22 194 lb (88 kg)     Physical Exam Constitutional:      General: He is not in acute distress.    Appearance: He is well-developed.     Comments: NAD  Eyes:     Conjunctiva/sclera: Conjunctivae normal.     Pupils: Pupils are equal, round, and reactive to light.  Neck:     Thyroid: No thyromegaly.     Vascular: No JVD.  Cardiovascular:     Rate and Rhythm: Normal rate and regular rhythm.     Heart sounds: Normal heart sounds. No murmur heard.    No friction rub. No gallop.  Pulmonary:     Effort: Pulmonary effort is normal. No respiratory distress.     Breath sounds: Normal breath sounds. No wheezing or rales.  Chest:     Chest wall: No tenderness.  Abdominal:     General: Bowel sounds are normal. There is no distension.     Palpations: Abdomen is soft. There is no mass.     Tenderness: There is no abdominal tenderness. There is no guarding or rebound.  Musculoskeletal:        General: Tenderness present. Normal range of motion.     Cervical back: Normal range of motion.  Lymphadenopathy:     Cervical: No cervical adenopathy.  Skin:    General: Skin is warm and dry.     Findings: No rash.  Neurological:     Mental Status: He is alert and oriented to person, place, and time.     Cranial Nerves: No cranial nerve deficit.     Motor: No abnormal muscle tone.     Coordination: Coordination normal.     Gait: Gait abnormal.     Deep Tendon Reflexes: Reflexes are normal and symmetric.  Psychiatric:        Behavior: Behavior normal.        Thought Content: Thought content normal.        Judgment: Judgment normal.   Antalgic gait LS w/pain  Pain in  L>>R MTP joints   Lab Results  Component Value Date   WBC 8.2 03/14/2022   HGB 15.0 03/14/2022   HCT 44.2 03/14/2022   PLT 257.0 03/14/2022   GLUCOSE 94 09/11/2022   CHOL 171 03/14/2022   TRIG 165.0 (H) 03/14/2022   HDL 50.20 03/14/2022   LDLCALC 88 03/14/2022   ALT 39 09/11/2022   AST 20 09/11/2022   NA 137 09/11/2022   K 3.8  09/11/2022   CL 101 09/11/2022   CREATININE 0.91 09/11/2022   BUN 16 09/11/2022   CO2 29 09/11/2022   TSH 1.27 09/11/2022   PSA 1.83 09/04/2021   HGBA1C 5.5 09/04/2021    CT CORONARY MORPH W/CTA COR W/SCORE W/CA W/CM &/  OR WO/CM  Addendum Date: 10/20/2022   ADDENDUM REPORT: 10/20/2022 17:02 EXAM: OVER-READ INTERPRETATION  CT CHEST The following report is an over-read performed by radiologist Dr. Narda Rutherford of [facility] on 10/20/2022. This over-read does not include interpretation of cardiac or coronary anatomy or pathology. The coronary CTA interpretation by the cardiologist is attached. COMPARISON:  Cardiac CT 03/23/2022 FINDINGS: Vascular: No aortic atherosclerosis. The included aorta is normal in caliber. Mediastinum/nodes: No adenopathy or mass. Unremarkable esophagus. Lungs: Mild hypoventilatory changes in the dependent lungs. Bandlike atelectasis in the right lower lobe. No pulmonary nodule. No pleural fluid. The included airways are patent. Upper abdomen: No acute or unexpected findings. Musculoskeletal: There are no acute or suspicious osseous abnormalities. Midthoracic vertebral hemangioma, incidental IMPRESSION: No acute or unexpected extracardiac findings. Electronically Signed   By: Narda Rutherford M.D.   On: 10/20/2022 17:02   Result Date: 10/20/2022 HISTORY: Chest pain/anginal equiv, intermediate CAD risk, not treadmill candidate EXAM: Cardiac/Coronary  CT TECHNIQUE: The patient was scanned on a [employer]. PROTOCOL: A 120 kV prospective scan was triggered in the descending thoracic aorta at 111 HU's. Axial non-contrast 3 mm slices were carried out through the heart. The data set was analyzed on a dedicated work station and scored using the Agatston method. Gantry rotation speed was 250 msecs and collimation was .6 mm. Beta blockade and 0.8 mg of sl NTG was given. The 3D data set was reconstructed in 5% intervals of the 35-75 % of the R-R cycle. Systolic and  diastolic phases were analyzed on a dedicated work station using MPR, MIP and VRT modes. The patient received contrast: OMNIPAQUE IOHEXOL 350 MG/ML SOLN. FINDINGS: Image quality: Good Noise artifact is: Limited Coronary calcium score is 4, which places the patient in the 72nd percentile for age and sex matched control. Coronary arteries: Normal coronary origins.  Right dominance. Right Coronary Artery: No detectable plaque or stenosis Left Main Coronary Artery: Trivial calcification that represents minimal mixed atherosclerotic plaque in the distal LM, <25% stenosis. Left Anterior Descending Coronary Artery: Minimal atherosclerotic plaque in the proximal LAD, <25% stenosis. Left Circumflex Artery: No detectable plaque or stenosis. Aorta: Normal size, 29 mm at the mid ascending aorta (level of the PA bifurcation) measured double oblique. Aortic Valve: No calcifications. Other findings: Normal pulmonary vein drainage into the left atrium. Normal left atrial appendage without thrombus. Normal size of the pulmonary artery. Please see separate report from [facility] for non-cardiac findings. IMPRESSION: 1. Minimal CAD, <25% stenosis, CADRADS 1. 2. Total plaque volume 9 mm3 which is 27th percentile for age- and sex-matched controls (calcified plaque 2 mm3; non-calcified plaque 7 mm3). TPV is mild. 3. Coronary calcium score of 4. This was 72nd percentile for age and sex matched control. 4. Normal coronary origins with right dominance. RECOMMENDATIONS: CAD-RADS 1. Minimal non-obstructive CAD (0-24%). Consider non-atherosclerotic causes of chest pain. Consider preventive therapy and risk factor modification. Electronically Signed: By: Weston Brass M.D. On: 10/17/2022 13:14    Assessment & Plan:   Problem List Items Addressed This Visit     Low back pain    On Vit D+K Oxy Rx Pregabaline On Testosterone inj      Relevant Medications   Oxycodone HCl 10 MG TABS   Low vitamin B12 level -  Primary    On B12      Constipation    Try Trulance      Hypogonadism male    On IM injections of testosterone 200 mg  biweekly Hope to help muscle strength especially in the lower extremities, muscle mass, depression and apathy.  Potential benefits of a long term sex steroid  use as well as potential risks  and complications were explained to the patient and were aknowledged.       Relevant Orders   Testosterone   Comprehensive metabolic panel   Foot pain, bilateral    Check Uric acid for pain in  L>>R MTP joints Check Uric acid      Relevant Orders   Uric acid   Comprehensive metabolic panel      Meds ordered this encounter  Medications   Plecanatide (TRULANCE) 3 MG TABS    Sig: Take 1 tablet (3 mg total) by mouth daily. For constipation    Dispense:  30 tablet    Refill:  11   esomeprazole (NEXIUM) 40 MG capsule    Sig: Take 1 capsule (40 mg total) by mouth 2 (two) times daily before a meal.    Dispense:  60 capsule    Refill:  11   Oxycodone HCl 10 MG TABS    Sig: Take 1 tablet (10 mg total) by mouth 3 (three) times daily as needed.    Dispense:  90 tablet    Refill:  0    Please fill on or after 11/10/22   pregabalin (LYRICA) 150 MG capsule    Sig: Take 1 capsule (150 mg total) by mouth 2 (two) times daily.    Dispense:  60 capsule    Refill:  5      Follow-up: Return in about 4 weeks (around 12/10/2022) for a follow-up visit.  Sonda Primes, MD

## 2022-11-12 NOTE — Assessment & Plan Note (Signed)
Try Trulance

## 2022-11-12 NOTE — Assessment & Plan Note (Signed)
On B12 

## 2022-11-12 NOTE — Assessment & Plan Note (Signed)
On Vit D+K Oxy Rx Pregabaline On Testosterone inj

## 2022-11-12 NOTE — Assessment & Plan Note (Signed)
On IM injections of testosterone 200 mg  biweekly Hope to help muscle strength especially in the lower extremities, muscle mass, depression and apathy.  Potential benefits of a long term sex steroid  use as well as potential risks  and complications were explained to the patient and were aknowledged.

## 2022-11-13 ENCOUNTER — Telehealth: Payer: Self-pay | Admitting: *Deleted

## 2022-11-13 NOTE — Telephone Encounter (Signed)
Pt need PA on Trulance 3mg .. Submitted w/ (Key: [id number]) Rec'd msg Your information has been submitted to [employer]. [employer].Marland KitchenRaechel Chute

## 2022-11-13 NOTE — Telephone Encounter (Signed)
Rec'd msg back from insurance Med was approved . Authorization Expiration Date: 07/02/2023. Sending pt mychart message of approval../lmb

## 2022-11-14 ENCOUNTER — Other Ambulatory Visit: Payer: Self-pay | Admitting: Internal Medicine

## 2022-11-14 ENCOUNTER — Encounter: Payer: Self-pay | Admitting: Internal Medicine

## 2022-11-14 MED ORDER — TESTOSTERONE CYPIONATE 200 MG/ML IM SOLN: INTRAMUSCULAR | 5 refills | Status: DC

## 2022-11-19 ENCOUNTER — Encounter (HOSPITAL_COMMUNITY): Payer: Self-pay

## 2022-11-19 ENCOUNTER — Emergency Department (HOSPITAL_COMMUNITY): Payer: Medicare HMO

## 2022-11-19 ENCOUNTER — Other Ambulatory Visit: Payer: Self-pay

## 2022-11-19 ENCOUNTER — Emergency Department (HOSPITAL_COMMUNITY)
Admission: EM | Admit: 2022-11-19 | Discharge: 2022-11-19 | Disposition: A | Discharge status: Home / Self Care | Payer: Medicare HMO | Attending: Emergency Medicine | Admitting: Emergency Medicine

## 2022-11-19 DIAGNOSIS — K760 Fatty (change of) liver, not elsewhere classified: Secondary | ICD-10-CM | POA: Diagnosis not present

## 2022-11-19 DIAGNOSIS — R1031 Right lower quadrant pain: Secondary | ICD-10-CM | POA: Diagnosis not present

## 2022-11-19 DIAGNOSIS — Z7982 Long term (current) use of aspirin: Secondary | ICD-10-CM | POA: Diagnosis not present

## 2022-11-19 DIAGNOSIS — R109 Unspecified abdominal pain: Secondary | ICD-10-CM | POA: Diagnosis not present

## 2022-11-19 LAB — URINALYSIS, ROUTINE W REFLEX MICROSCOPIC
Bilirubin Urine: NEGATIVE
Glucose, UA: NEGATIVE mg/dL
Hgb urine dipstick: NEGATIVE
Ketones, ur: NEGATIVE mg/dL
Leukocytes,Ua: NEGATIVE
Nitrite: NEGATIVE
Protein, ur: NEGATIVE mg/dL
Specific Gravity, Urine: 1.012 (ref 1.005–1.030)
pH: 6 (ref 5.0–8.0)

## 2022-11-19 LAB — COMPREHENSIVE METABOLIC PANEL
ALT: 58 U/L — ABNORMAL HIGH (ref 0–44)
AST: 28 U/L (ref 15–41)
Albumin: 4.2 g/dL (ref 3.5–5.0)
Alkaline Phosphatase: 78 U/L (ref 38–126)
Anion gap: 9 (ref 5–15)
BUN: 14 mg/dL (ref 6–20)
CO2: 21 mmol/L — ABNORMAL LOW (ref 22–32)
Calcium: 8.7 mg/dL — ABNORMAL LOW (ref 8.9–10.3)
Chloride: 106 mmol/L (ref 98–111)
Creatinine, Ser: 0.87 mg/dL (ref 0.61–1.24)
GFR, Estimated: >60 mL/min (ref >60)
Glucose, Bld: 131 mg/dL — ABNORMAL HIGH (ref 70–99)
Potassium: 3.2 mmol/L — ABNORMAL LOW (ref 3.5–5.1)
Sodium: 136 mmol/L (ref 135–145)
Total Bilirubin: 0.7 mg/dL (ref 0.3–1.2)
Total Protein: 6.8 g/dL (ref 6.5–8.1)

## 2022-11-19 LAB — CBC
HCT: 43.9 % (ref 39.0–52.0)
Hemoglobin: 15 g/dL (ref 13.0–17.0)
MCH: 29.8 pg (ref 26.0–34.0)
MCHC: 34.2 g/dL (ref 30.0–36.0)
MCV: 87.3 fL (ref 80.0–100.0)
Platelets: 221 10*3/uL (ref 150–400)
RBC: 5.03 MIL/uL (ref 4.22–5.81)
RDW: 12.2 % (ref 11.5–15.5)
WBC: 9 10*3/uL (ref 4.0–10.5)
nRBC: 0 % (ref 0.0–0.2)

## 2022-11-19 LAB — LIPASE, BLOOD: Lipase: 36 U/L (ref 11–51)

## 2022-11-19 MED ORDER — IOHEXOL 300 MG/ML  SOLN
100.0000 mL | Freq: Once | INTRAMUSCULAR | Status: AC | PRN
  Administered 2022-11-19: 100 mL via INTRAVENOUS

## 2022-11-19 MED ORDER — ONDANSETRON 8 MG PO TBDP
8.0000 mg | ORAL_TABLET | Freq: Once | ORAL | Status: AC
  Administered 2022-11-19: 8 mg via ORAL
  Filled 2022-11-19: qty 1

## 2022-11-19 NOTE — ED Provider Notes (Signed)
[place] EMERGENC[facility] DE[facility]MENT AT [facility] Provider Note   CSN: [id number] Arrival date & time: 11/19/22  1356     History {Add pertinent medical, surgical, social history, OB history to HPI:1} Chief Complaint  Patient presents with   Abdominal Pain    [facility] Vaughn is a 47 y.o. male.  Patient complains of right lower quadrant pain.  This been going on for 3 days.   Abdominal Pain      Home Medications Prior to Admission medications   Medication Sig Start Date End Date Taking? Authorizing Provider  albuterol (PROAIR HFA) 108 (90 Base) MCG/ACT inhaler Inhale 2 puffs into the lungs every 6 (six) hours as needed for wheezing or shortness of breath. 11/01/21 11/01/22  Plotnikov, Georgina Quint, MD  aspirin EC 81 MG tablet Take 1 tablet (81 mg total) by mouth daily. Swallow whole. 10/11/22   Lewayne Bunting, MD  Docusate Calcium (STOOL SOFTENER PO) Take 1 tablet by mouth daily.    [provider]  esomeprazole (NEXIUM) 40 MG capsule Take 1 capsule (40 mg total) by mouth 2 (two) times daily before a meal. 11/12/22   Plotnikov, Georgina Quint, MD  fluticasone (FLONASE) 50 MCG/ACT nasal spray Place 2 sprays into both nostrils daily. 03/14/22   Plotnikov, Georgina Quint, MD  hyoscyamine (LEVSIN SL) 0.125 MG SL tablet DISSOLVE 1 TABLET UNDER THE TONGUE EVERY 6 HOURS AS NEEDED 11/05/22   Plotnikov, Georgina Quint, MD  Lactulose 20 GM/30ML SOLN Take 30-60 mLs (20-40 g total) by mouth 2 (two) times daily as needed (severe constipation). Patient not taking: Reported on 11/12/2022 03/14/22   Plotnikov, Georgina Quint, MD  methylcellulose (CITRUCEL) oral powder Take 1 packet by mouth daily. 11/01/21   Rachael Fee, MD  metoprolol tartrate (LOPRESSOR) 100 MG tablet TAKE 2 HOURS PRIOR TO CT SCAN 10/11/22   Lewayne Bunting, MD  naproxen (NAPROSYN) 500 MG tablet Take 1 tablet (500 mg total) by mouth 2 (two) times daily as needed for moderate pain. 07/13/21   Plotnikov, Georgina Quint, MD  NEEDLE, DISP, 23  G (BD DISP NEEDLE) 23G X 1" MISC Use for intramuscular injections. 09/19/22   Plotnikov, Georgina Quint, MD  ondansetron (ZOFRAN) 4 MG tablet Take 1 tablet (4 mg total) by mouth every 8 (eight) hours as needed for nausea or vomiting. 07/12/22   Plotnikov, Georgina Quint, MD  Oxycodone HCl 10 MG TABS Take 1 tablet (10 mg total) by mouth 3 (three) times daily as needed. 11/12/22   Plotnikov, Georgina Quint, MD  Plecanatide (TRULANCE) 3 MG TABS Take 1 tablet (3 mg total) by mouth daily. For constipation 11/12/22   Plotnikov, Georgina Quint, MD  pregabalin (LYRICA) 150 MG capsule Take 1 capsule (150 mg total) by mouth 2 (two) times daily. 11/12/22   Plotnikov, Georgina Quint, MD  RELISTOR 150 MG TABS Take 1 tablet by mouth daily. 10/15/22   [provider]  rosuvastatin (CRESTOR) 40 MG tablet Take 1 tablet (40 mg total) by mouth daily. 10/11/22   Lewayne Bunting, MD  tamsulosin (FLOMAX) 0.4 MG CAPS capsule Take 1 capsule (0.4 mg total) by mouth daily. 07/12/22   Plotnikov, Georgina Quint, MD  testosterone cypionate (DEPOTESTOSTERONE CYPIONATE) 200 MG/ML injection Use 200 mg IM every 10 days 11/14/22   Plotnikov, Georgina Quint, MD  tiZANidine (ZANAFLEX) 4 MG capsule Take 1 capsule (4 mg total) by mouth 3 (three) times daily. 07/12/22   Plotnikov, Georgina Quint, MD  Vitamin D-Vitamin K (K2 PLUS D3)  [phone] MCG-UNIT TABS 1 po daily 09/11/22   Plotnikov, Georgina Quint, MD      Allergies    Cymbalta [duloxetine hcl] and Gabapentin    Review of Systems   Review of Systems  Gastrointestinal:  Positive for abdominal pain.    Physical Exam Updated Vital Signs BP 133/86 (BP Location: Right Arm)   Pulse 74   Temp 98.1 F (36.7 C) (Oral)   Resp 18   SpO2 96%  Physical Exam  ED Results / Procedures / Treatments   Labs (all labs ordered are listed, but only abnormal results are displayed) Labs Reviewed  COMPREHENSIVE METABOLIC PANEL - Abnormal; Notable for the following components:      Result Value   Potassium 3.2 (*)    CO2 21 (*)     Glucose, Bld 131 (*)    Calcium 8.7 (*)    ALT 58 (*)    All other components within normal limits  LIPASE, BLOOD  CBC  URINALYSIS, ROUTINE W REFLEX MICROSCOPIC    EKG None  Radiology CT ABDOMEN PELVIS W CONTRAST  Result Date: 11/19/2022 CLINICAL DATA:  Right lower quadrant abdominal pain EXAM: CT ABDOMEN AND PELVIS WITH CONTRAST TECHNIQUE: Multidetector CT imaging of the abdomen and pelvis was performed using the standard protocol following bolus administration of intravenous contrast. RADIATION DOSE REDUCTION: This exam was performed according to the departmental dose-optimization program which includes automated exposure control, adjustment of the mA and/or kV according to patient size and/or use of iterative reconstruction technique. CONTRAST:  OMNIPAQUE IOHEXOL 300 MG/ML  SOLN COMPARISON:  MRI abdomen 10/02/2020 and CT renal stone protocol 07/10/2017 FINDINGS: Lower chest: No acute abnormality. Hepatobiliary: Cholecystectomy. Mild hepatic steatosis. No biliary dilation. Pancreas: Unremarkable. Spleen: Unremarkable. Adrenals/Urinary Tract: Normal adrenal glands. Nonobstructing nephrolithiasis bilaterally. No ureteral stones or hydronephrosis. Unremarkable bladder. Stomach/Bowel: Normal caliber large and small bowel. No bowel wall thickening. Normal appendix. Stomach is within normal limits. Vascular/Lymphatic: No significant vascular findings are present. No enlarged abdominal or pelvic lymph nodes. Reproductive: Unremarkable. Other: No free intraperitoneal fluid or air. Musculoskeletal: Posterior fusion L4-S1 with interbody spacers. No acute osseous abnormality. IMPRESSION: 1. No acute abdominopelvic abnormality. Normal appendix. 2. Bilateral nonobstructing nephrolithiasis. 3. Mild hepatic steatosis. Electronically Signed   By: Minerva Fester M.D.   On: 11/19/2022 17:33    Procedures Procedures  {Document cardiac monitor, telemetry assessment procedure when  appropriate:1}  Medications Ordered in ED Medications  ondansetron (ZOFRAN-ODT) disintegrating tablet 8 mg (8 mg Oral Given 11/19/22 1729)  iohexol (OMNIPAQUE) 300 MG/ML solution 100 mL (100 mLs Intravenous Contrast Given 11/19/22 1633)    ED Course/ Medical Decision Making/ A&P   {   Click here for ABCD2, HEART and other calculatorsREFRESH Note before signing :1}                          Medical Decision Making Amount and/or Complexity of Data Reviewed Labs: ordered.   Patient with abdominal pain.  CT scan shows no appendicitis or pathology.  Pain is most likely related to muscle or ligamental strain.  He will follow-up with his PCP  {Document critical care time when appropriate:1} {Document review of labs and clinical decision tools ie heart score, Chads2Vasc2 etc:1}  {Document your independent review of radiology images, and any outside records:1} {Document your discussion with family members, caretakers, and with consultants:1} {Document social determinants of health affecting pt's care:1} {Document your decision making why or why not admission, treatments were needed:1}  Final Clinical Impression(s) / ED Diagnoses Final diagnoses:  Right lower quadrant abdominal pain    Rx / DC Orders ED Discharge Orders     None

## 2022-11-19 NOTE — ED Provider Triage Note (Signed)
Emergency Medicine Provider Triage Evaluation Note  [place] Vaughn , a 47 y.o. male  was evaluated in triage.  Pt complains of right lower quadrant abdominal pain.  Patient states that symptoms been present for the past 2 to 3 days.  Reports associated nausea.  States the pain is worsened with movements standing up and walking.  Denies increased pain with consumption of food.  Does state that he has felt nauseous.  Denies vomiting, urinary symptoms, change in bowel habits.  Past bowel movement approximately 1 and half days ago of which patient states is his baseline.  Review of Systems  Positive: See above Negative:   Physical Exam  BP (!) 137/95 (BP Location: Left Arm)   Pulse 79   Temp 98.5 F (36.9 C) (Oral)   Resp 17   SpO2 99%  Gen:   Awake, no distress   Resp:  Normal effort  MSK:   Moves extremities without difficulty  Other:  Right lower quadrant tenderness to palpation  Medical Decision Making  Medically screening exam initiated at 2:53 PM.  Appropriate orders placed.  [facility] Vaughn was informed that the remainder of the evaluation will be completed by another provider, this initial triage assessment does not replace that evaluation, and the importance of remaining in the ED until their evaluation is complete.     Peter Garter, [place] 11/19/22 1454

## 2022-11-19 NOTE — ED Triage Notes (Signed)
C/o lower abd pain x3 days with nausea.  Nausea relieved with Zofran.  Pain worse with standing.  Denies urinary s/sy

## 2022-11-19 NOTE — Discharge Instructions (Signed)
Follow-up with your family doctor next week for recheck. 

## 2022-11-28 ENCOUNTER — Ambulatory Visit: Payer: Medicare HMO | Admitting: Podiatry

## 2022-11-28 ENCOUNTER — Encounter: Payer: Self-pay | Admitting: Podiatry

## 2022-11-28 ENCOUNTER — Ambulatory Visit (INDEPENDENT_AMBULATORY_CARE_PROVIDER_SITE_OTHER): Payer: Medicare HMO

## 2022-11-28 DIAGNOSIS — M7752 Other enthesopathy of left foot: Secondary | ICD-10-CM

## 2022-11-28 DIAGNOSIS — M7751 Other enthesopathy of right foot: Secondary | ICD-10-CM

## 2022-11-28 DIAGNOSIS — M7671 Peroneal tendinitis, right leg: Secondary | ICD-10-CM | POA: Diagnosis not present

## 2022-11-28 DIAGNOSIS — M779 Enthesopathy, unspecified: Secondary | ICD-10-CM

## 2022-11-28 MED ORDER — TRIAMCINOLONE ACETONIDE 10 MG/ML IJ SUSP
20.0000 mg | Freq: Once | INTRAMUSCULAR | Status: AC
Start: 2022-11-28 — End: 2022-11-28
  Administered 2022-11-28: 20 mg

## 2022-11-28 NOTE — Progress Notes (Signed)
Subjective:   Patient ID: [place] [facility]n, male   DOB: 47 y.o.   MRN: [id number]   HPI Patient presents with several different problems with 1 being inflammation and pain to the big toe joint left over right with fluid buildup and reduced motion and also complaining of continued pain in the outside of the right foot and into the back of the right heel.  States that is not as bad as it was but still tender   ROS      Objective:  Physical Exam  Neurovascular status intact with inflammation pain around the first MPJ left over right with fluid buildup within the joint reduced range of motion no crepitus also discomfort in the peroneal tendon right as it comes underneath the lateral malleolus and inserts into the base of the fifth metatarsal with no indication of tendon dysfunction     Assessment:  Appears to be more of an inflammatory condition and it does also appear that there is some functional hallux limitus with possibility that it may involve joint cartilage or other pathology.  Also has peroneal tendinitis which may be related to gait change or separate distinct problem     Plan:  H&P reviewed both conditions.  At this point for the lateral right we will use ice and have not tried it improve the big toe joint and see whether or not this will get [place] where we need to get.  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

## 2022-12-04 DIAGNOSIS — R69 Illness, unspecified: Secondary | ICD-10-CM | POA: Diagnosis not present

## 2022-12-09 ENCOUNTER — Encounter: Payer: Self-pay | Admitting: Family Medicine

## 2022-12-10 ENCOUNTER — Encounter: Payer: Self-pay | Admitting: Internal Medicine

## 2022-12-10 ENCOUNTER — Ambulatory Visit (INDEPENDENT_AMBULATORY_CARE_PROVIDER_SITE_OTHER): Payer: Medicare HMO | Admitting: Orthopedic Surgery

## 2022-12-10 ENCOUNTER — Ambulatory Visit (INDEPENDENT_AMBULATORY_CARE_PROVIDER_SITE_OTHER): Payer: Medicare HMO | Admitting: Internal Medicine

## 2022-12-10 ENCOUNTER — Encounter: Payer: Self-pay | Admitting: Family Medicine

## 2022-12-10 ENCOUNTER — Ambulatory Visit: Payer: Self-pay

## 2022-12-10 ENCOUNTER — Ambulatory Visit (INDEPENDENT_AMBULATORY_CARE_PROVIDER_SITE_OTHER): Payer: Medicare HMO | Admitting: Family Medicine

## 2022-12-10 ENCOUNTER — Encounter: Payer: Self-pay | Admitting: Orthopedic Surgery

## 2022-12-10 ENCOUNTER — Ambulatory Visit (INDEPENDENT_AMBULATORY_CARE_PROVIDER_SITE_OTHER): Payer: Medicare HMO

## 2022-12-10 VITALS — BP 122/70 | HR 70 | Temp 98.2°F | Ht 68.0 in | Wt 198.0 lb

## 2022-12-10 VITALS — BP 122/70 | HR 70 | Ht 68.0 in | Wt 198.0 lb

## 2022-12-10 DIAGNOSIS — M25561 Pain in right knee: Secondary | ICD-10-CM | POA: Diagnosis not present

## 2022-12-10 DIAGNOSIS — M5442 Lumbago with sciatica, left side: Secondary | ICD-10-CM

## 2022-12-10 DIAGNOSIS — E291 Testicular hypofunction: Secondary | ICD-10-CM | POA: Diagnosis not present

## 2022-12-10 DIAGNOSIS — E876 Hypokalemia: Secondary | ICD-10-CM | POA: Insufficient documentation

## 2022-12-10 DIAGNOSIS — G8929 Other chronic pain: Secondary | ICD-10-CM | POA: Diagnosis not present

## 2022-12-10 DIAGNOSIS — M48062 Spinal stenosis, lumbar region with neurogenic claudication: Secondary | ICD-10-CM | POA: Diagnosis not present

## 2022-12-10 DIAGNOSIS — R7989 Other specified abnormal findings of blood chemistry: Secondary | ICD-10-CM | POA: Diagnosis not present

## 2022-12-10 DIAGNOSIS — M5441 Lumbago with sciatica, right side: Secondary | ICD-10-CM

## 2022-12-10 DIAGNOSIS — M170 Bilateral primary osteoarthritis of knee: Secondary | ICD-10-CM | POA: Diagnosis not present

## 2022-12-10 DIAGNOSIS — W19XXXA Unspecified fall, initial encounter: Secondary | ICD-10-CM | POA: Diagnosis not present

## 2022-12-10 DIAGNOSIS — M19011 Primary osteoarthritis, right shoulder: Secondary | ICD-10-CM

## 2022-12-10 LAB — BASIC METABOLIC PANEL
BUN: 12 mg/dL (ref 6–23)
CO2: 26 mEq/L (ref 19–32)
Calcium: 9.2 mg/dL (ref 8.4–10.5)
Chloride: 104 mEq/L (ref 96–112)
Creatinine, Ser: 1 mg/dL (ref 0.40–1.50)
GFR: 89.77 mL/min (ref >60.00)
Glucose, Bld: 99 mg/dL (ref 70–99)
Potassium: 4 mEq/L (ref 3.5–5.1)
Sodium: 139 mEq/L (ref 135–145)

## 2022-12-10 MED ORDER — OXYCODONE HCL 10 MG PO TABS: 10.0000 mg | ORAL_TABLET | Freq: Three times a day (TID) | ORAL | 0 refills | Status: DC | PRN

## 2022-12-10 NOTE — Assessment & Plan Note (Signed)
Pt fell on Fri - L knee gave out and he fell on the R knee X ray He will see Dr Denyse Amass

## 2022-12-10 NOTE — Assessment & Plan Note (Signed)
R knee X ray He will see Dr Denyse Amass

## 2022-12-10 NOTE — Assessment & Plan Note (Signed)
On Oxy  Potential benefits of a long term opioids use as well as potential risks (i.e. addiction risk, apnea etc) and complications (i.e. Somnolence, constipation and others) were explained to the patient and were aknowledged.  

## 2022-12-10 NOTE — Progress Notes (Signed)
Subjective:  Patient ID: [place] Vaughn, male    DOB: 1975/08/05  Age: 47 y.o. MRN: [id number]  CC: Follow-up (4 WEEK F/U)   HPI [place] [facility]n presents for LBP, hypogonadism Pt fell on Fri - L knee gave out and he fell on the R knee F/u low K, LBP  Outpatient Medications Prior to Visit  Medication Sig Dispense Refill   aspirin EC 81 MG tablet Take 1 tablet (81 mg total) by mouth daily. Swallow whole. 90 tablet 3   Docusate Calcium (STOOL SOFTENER PO) Take 1 tablet by mouth daily.     esomeprazole (NEXIUM) 40 MG capsule Take 1 capsule (40 mg total) by mouth 2 (two) times daily before a meal. 60 capsule 11   fluticasone (FLONASE) 50 MCG/ACT nasal spray Place 2 sprays into both nostrils daily. 16 g 6   hyoscyamine (LEVSIN SL) 0.125 MG SL tablet DISSOLVE 1 TABLET UNDER THE TONGUE EVERY 6 HOURS AS NEEDED 30 tablet 0   methylcellulose (CITRUCEL) oral powder Take 1 packet by mouth daily.     metoprolol tartrate (LOPRESSOR) 100 MG tablet TAKE 2 HOURS PRIOR TO CT SCAN 1 tablet 0   naproxen (NAPROSYN) 500 MG tablet Take 1 tablet (500 mg total) by mouth 2 (two) times daily as needed for moderate pain. 180 tablet 0   NEEDLE, DISP, 23 G (BD DISP NEEDLE) 23G X 1" MISC Use for intramuscular injections. 50 each 3   ondansetron (ZOFRAN) 4 MG tablet Take 1 tablet (4 mg total) by mouth every 8 (eight) hours as needed for nausea or vomiting. 20 tablet 1   Plecanatide (TRULANCE) 3 MG TABS Take 1 tablet (3 mg total) by mouth daily. For constipation 30 tablet 11   pregabalin (LYRICA) 150 MG capsule Take 1 capsule (150 mg total) by mouth 2 (two) times daily. 60 capsule 5   RELISTOR 150 MG TABS Take 1 tablet by mouth daily.     rosuvastatin (CRESTOR) 40 MG tablet Take 1 tablet (40 mg total) by mouth daily. 90 tablet 3   tamsulosin (FLOMAX) 0.4 MG CAPS capsule Take 1 capsule (0.4 mg total) by mouth daily. 90 capsule 3   testosterone cypionate (DEPOTESTOSTERONE CYPIONATE) 200 MG/ML injection Use 200 mg  IM every 10 days 10 mL 5   tiZANidine (ZANAFLEX) 4 MG capsule Take 1 capsule (4 mg total) by mouth 3 (three) times daily. 180 capsule 0   Vitamin D-Vitamin K (K2 PLUS D3) 516 837 9460 MCG-UNIT TABS 1 po daily 100 tablet 3   Oxycodone HCl 10 MG TABS Take 1 tablet (10 mg total) by mouth 3 (three) times daily as needed. 90 tablet 0   albuterol (PROAIR HFA) 108 (90 Base) MCG/ACT inhaler Inhale 2 puffs into the lungs every 6 (six) hours as needed for wheezing or shortness of breath. 3 each 1   Lactulose 20 GM/30ML SOLN Take 30-60 mLs (20-40 g total) by mouth 2 (two) times daily as needed (severe constipation). (Patient not taking: Reported on 11/12/2022) 1000 mL 3   No facility-administered medications prior to visit.    ROS: Review of Systems  Constitutional:  Negative for appetite change, fatigue and unexpected weight change.  HENT:  Negative for congestion, nosebleeds, sneezing, sore throat and trouble swallowing.   Eyes:  Negative for itching and visual disturbance.  Respiratory:  Negative for cough.   Cardiovascular:  Negative for chest pain, palpitations and leg swelling.  Gastrointestinal:  Negative for abdominal distention, blood in stool, diarrhea and nausea.  Genitourinary:  Negative for frequency and hematuria.  Musculoskeletal:  Positive for arthralgias, back pain and gait problem. Negative for joint swelling and neck pain.  Skin:  Negative for rash.  Neurological:  Positive for weakness. Negative for dizziness, tremors and speech difficulty.  Psychiatric/Behavioral:  Negative for agitation, dysphoric mood and sleep disturbance. The patient is not nervous/anxious.     Objective:  BP 122/70 (BP Location: Right Arm, Patient Position: Sitting, Cuff Size: Normal)   Pulse 70   Temp 98.2 F (36.8 C) (Oral)   Ht 5\' 8"  (1.727 m)   Wt 198 lb (89.8 kg)   SpO2 98%   BMI 30.11 kg/m   BP Readings from Last 3 Encounters:  12/10/22 122/70  11/19/22 133/66  11/12/22 120/78    Wt Readings  from Last 3 Encounters:  12/10/22 198 lb (89.8 kg)  11/12/22 196 lb (88.9 kg)  10/11/22 200 lb (90.7 kg)    Physical Exam Constitutional:      General: He is not in acute distress.    Appearance: He is well-developed.     Comments: NAD  Eyes:     Conjunctiva/sclera: Conjunctivae normal.     Pupils: Pupils are equal, round, and reactive to light.  Neck:     Thyroid: No thyromegaly.     Vascular: No JVD.  Cardiovascular:     Rate and Rhythm: Normal rate and regular rhythm.     Heart sounds: Normal heart sounds. No murmur heard.    No friction rub. No gallop.  Pulmonary:     Effort: Pulmonary effort is normal. No respiratory distress.     Breath sounds: Normal breath sounds. No wheezing or rales.  Chest:     Chest wall: No tenderness.  Abdominal:     General: Bowel sounds are normal. There is no distension.     Palpations: Abdomen is soft. There is no mass.     Tenderness: There is no abdominal tenderness. There is no guarding or rebound.  Musculoskeletal:        General: No tenderness. Normal range of motion.     Cervical back: Normal range of motion.  Lymphadenopathy:     Cervical: No cervical adenopathy.  Skin:    General: Skin is warm and dry.     Findings: No rash.  Neurological:     Mental Status: He is alert and oriented to person, place, and time.     Cranial Nerves: No cranial nerve deficit.     Motor: No abnormal muscle tone.     Coordination: Coordination normal.     Gait: Gait normal.     Deep Tendon Reflexes: Reflexes are normal and symmetric.  Psychiatric:        Behavior: Behavior normal.        Thought Content: Thought content normal.        Judgment: Judgment normal.   Using a cane R medial knee w/pain, puffy LS w/pain Weak legs  Lab Results  Component Value Date   WBC 9.0 11/19/2022   HGB 15.0 11/19/2022   HCT 43.9 11/19/2022   PLT 221 11/19/2022   GLUCOSE 131 (H) 11/19/2022   CHOL 171 03/14/2022   TRIG 165.0 (H) 03/14/2022   HDL 50.20  03/14/2022   LDLCALC 88 03/14/2022   ALT 58 (H) 11/19/2022   AST 28 11/19/2022   NA 136 11/19/2022   K 3.2 (L) 11/19/2022   CL 106 11/19/2022   CREATININE 0.87 11/19/2022   BUN 14 11/19/2022   CO2 21 (  L) 11/19/2022   TSH 1.27 09/11/2022   PSA 1.83 09/04/2021   HGBA1C 5.5 09/04/2021    CT ABDOMEN PELVIS W CONTRAST  Result Date: 11/19/2022 CLINICAL DATA:  Right lower quadrant abdominal pain EXAM: CT ABDOMEN AND PELVIS WITH CONTRAST TECHNIQUE: Multidetector CT imaging of the abdomen and pelvis was performed using the standard protocol following bolus administration of intravenous contrast. RADIATION DOSE REDUCTION: This exam was performed according to the departmental dose-optimization program which includes automated exposure control, adjustment of the mA and/or kV according to patient size and/or use of iterative reconstruction technique. CONTRAST:  OMNIPAQUE IOHEXOL 300 MG/ML  SOLN COMPARISON:  MRI abdomen 10/02/2020 and CT renal stone protocol 07/10/2017 FINDINGS: Lower chest: No acute abnormality. Hepatobiliary: Cholecystectomy. Mild hepatic steatosis. No biliary dilation. Pancreas: Unremarkable. Spleen: Unremarkable. Adrenals/Urinary Tract: Normal adrenal glands. Nonobstructing nephrolithiasis bilaterally. No ureteral stones or hydronephrosis. Unremarkable bladder. Stomach/Bowel: Normal caliber large and small bowel. No bowel wall thickening. Normal appendix. Stomach is within normal limits. Vascular/Lymphatic: No significant vascular findings are present. No enlarged abdominal or pelvic lymph nodes. Reproductive: Unremarkable. Other: No free intraperitoneal fluid or air. Musculoskeletal: Posterior fusion L4-S1 with interbody spacers. No acute osseous abnormality. IMPRESSION: 1. No acute abdominopelvic abnormality. Normal appendix. 2. Bilateral nonobstructing nephrolithiasis. 3. Mild hepatic steatosis. Electronically Signed   By: Minerva Fester M.D.   On: 11/19/2022 17:33    Assessment  & Plan:   Problem List Items Addressed This Visit     Low back pain   Relevant Medications   Oxycodone HCl 10 MG TABS   Oxycodone HCl 10 MG TABS   Spinal stenosis of lumbar region with neurogenic claudication     On Oxy  Potential benefits of a long term opioids use as well as potential risks (i.e. addiction risk, apnea etc) and complications (i.e. Somnolence, constipation and others) were explained to the patient and were aknowledged.      Relevant Medications   Oxycodone HCl 10 MG TABS   Oxycodone HCl 10 MG TABS   Low vitamin B12 level    On B complex      Knee pain    Pt fell on Fri - L knee gave out and he fell on the R knee X ray He will see Dr Denyse Amass      Relevant Orders   DG Knee 1-2 Views Right   Falls - Primary    R knee X ray He will see Dr Denyse Amass      Relevant Orders   DG Knee 1-2 Views Right   Hypogonadism male    On IM injections of testosterone 200 mg  q 10 d Hope to help muscle strength especially in the lower extremities, muscle mass, depression and apathy.  Potential benefits of a long term sex steroid  use as well as potential risks  and complications were explained to the patient and were aknowledged.       Hypokalemia   Relevant Orders   Basic metabolic panel      Meds ordered this encounter  Medications   Oxycodone HCl 10 MG TABS    Sig: Take 1 tablet (10 mg total) by mouth 3 (three) times daily as needed.    Dispense:  90 tablet    Refill:  0    Please fill on or after 12/10/22   Oxycodone HCl 10 MG TABS    Sig: Take 1 tablet (10 mg total) by mouth 3 (three) times daily as needed.    Dispense:  90  tablet    Refill:  0    Please fill on or after 01/09/23      Follow-up: Return in about 2 months (around 02/09/2023) for a follow-up visit.  Sonda Primes, MD

## 2022-12-10 NOTE — Assessment & Plan Note (Signed)
On B complex 

## 2022-12-10 NOTE — Patient Instructions (Signed)
Thank you for coming in today.   You should hear from MRI scheduling within 1 week. If you do not hear please let me know.    Ok to use a hinged knee brace.   Let me and Dr Posey Rea know if your pain is out of control.   Keep me updated.

## 2022-12-10 NOTE — Progress Notes (Signed)
Office Visit Note   Patient: Adam Vaughn           Date of Birth: 12-14-1975           MRN: [id number] Visit Date: 12/10/2022 Requested by: Tresa Garter, MD [address] [place],  [place] [address] PCP: Plotnikov, Georgina Quint, MD  Subjective: Chief Complaint  Patient presents with   Follow-up    HPI: Adam Vaughn is a 47 y.o. male who presents to the office reporting right shoulder pain and right knee effusion.  Patient had AC joint injection 10/29/2022.  Decision was for or against MRI scanning.  Spasm in the biceps has improved.  Had lumbar ESI on 11/07/2022.  Has also been seen by Dr. Denyse Amass today for right knee pain.  Had an injury over the weekend..                ROS: All systems reviewed are negative as they relate to the chief complaint within the history of present illness.  Patient denies fevers or chills.  Assessment & Plan: Visit Diagnoses:  1. Arthritis of right acromioclavicular joint     Plan: Impression is right shoulder pain which does look like it is coming from [facility] joint irritation.  Has a little crepitus on exam as well as tenderness to palpation along the anterior part of the [facility] joint.  I think he may want to consider another [facility] joint injection sometime later this year if his symptoms worsen.  The right knee does have an effusion and MRI scan is pending.  He will follow-up with [place] as needed.  He did get about 40 to 50% pain relief from his [facility] joint injection.  Follow-Up Instructions: No follow-ups on file.   Orders:  No orders of the defined types were placed in this encounter.  No orders of the defined types were placed in this encounter.     Procedures: No procedures performed   Clinical Data: No additional findings.  Objective: Vital Signs: There were no vitals taken for this visit.  Physical Exam:  Constitutional: Patient appears well-developed HEENT:  Head: Normocephalic Eyes:EOM are normal Neck: Normal range of  motion Cardiovascular: Normal rate Pulmonary/chest: Effort normal Neurologic: Patient is alert Skin: Skin is warm Psychiatric: Patient has normal mood and affect  Ortho Exam: Ortho exam demonstrates full active and passive range of motion of the shoulder.  Does have tenderness at the [facility] joint on the right negative on the left.  Most of the pain is anterior.  Has little bit of crepitus with passive range of motion of the shoulder and the crepitus does localize to the [facility] joint.  Some pain with crossarm adduction on the left compared to the right.  No other masses lymphadenopathy or skin changes noted in that shoulder girdle region and the rotator cuff strength is intact with symmetric external rotation in both shoulders.  Specialty Comments:  EXAM: MRI LUMBAR SPINE WITHOUT CONTRAST   TECHNIQUE: Multiplanar, multisequence MR imaging of the lumbar spine was performed. No intravenous contrast was administered.   COMPARISON:  01/18/2021   FINDINGS: Segmentation:  Standard.   Alignment:  2 mm retrolisthesis of L3 on L4.   Vertebrae: No acute fracture, evidence of discitis, or aggressive bone lesion. 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   Conus medullaris and cauda equina: Conus extends to the T12-L1 level. Conus and cauda equina appear normal.   Paraspinal and other soft tissues: No acute paraspinal abnormality.  Postsurgical changes in the posterior paraspinal soft tissues at L4-S1.   Disc levels:   Disc spaces: Posterior lumbar interbody fusion from L4 through S1.   T12-L1: No significant disc bulge. No neural foraminal stenosis. No central canal stenosis.   L1-L2: No significant disc bulge. No neural foraminal stenosis. No central canal stenosis.   L2-L3: Minimal broad-based disc bulge. No left foraminal stenosis. Mild right foraminal stenosis. No spinal stenosis.   L3-L4: Broad-based disc bulge flattening ventral thecal sac. Mild bilateral facet  arthropathy. Mild bilateral foraminal stenosis. Mild spinal stenosis. Bilateral lateral recess stenosis.   L4-L5: Interbody fusion.  No foraminal or central canal stenosis.   L5-S1: Interbody fusion.  No foraminal or central canal stenosis.   IMPRESSION: 1. At L3-4 there is a broad-based disc bulge flattening ventral thecal sac. Mild bilateral facet arthropathy. Mild bilateral foraminal stenosis. Mild spinal stenosis. Bilateral lateral recess stenosis. 2. Posterior lumbar interbody fusion from L4 through S1 without foraminal or central canal stenosis. 3. No acute osseous injury of the lumbar spine.     Electronically Signed   By: Elige Ko M.D.   On: 05/22/2022 10:20  Imaging: No results found.   PMFS History: Patient Active Problem List   Diagnosis Date Noted   Hypokalemia 12/10/2022   Foot pain, bilateral 11/12/2022   Hypogonadism male 09/19/2022   Chronic eczematous otitis externa of both ears 05/16/2022   Hypertrophy of inferior nasal turbinate 05/16/2022   Situational depression 05/14/2022   Coronary atherosclerosis 03/26/2022   Dyslipidemia 01/11/2022   Chronic pain syndrome 11/07/2021   Piriformis syndrome 11/07/2021   Bladder neck obstruction 09/04/2021   Asthmatic bronchitis 09/04/2021   Pain in right shoulder 06/29/2021   Meteorism 06/01/2021   Sinus congestion 02/28/2021   Vertigo 02/17/2021   Headache 02/17/2021   Fever 02/17/2021   Falls 02/17/2021   Allergic rhinitis 10/25/2020   Family history of acute polio 10/25/2020   Chest pain, atypical 10/10/2020   S/P laparoscopic cholecystectomy 10/10/2020   Hyperglycemia 10/10/2020   Acute cholecystitis 09/30/2020   Insomnia 09/26/2020   Knee pain 06/23/2020   Low vitamin B12 level 05/31/2020   Ankle sprain 05/31/2020   Constipation 05/31/2020   Degenerative spondylolisthesis 04/11/2020   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   DDD (degenerative disc disease),  lumbar 01/20/2020   Left lumbar radiculitis 01/20/2020   Vitamin D deficiency 01/15/2020   Low back pain 01/12/2020   IBS (irritable bowel syndrome) 01/12/2020   Nephrolithiasis 01/12/2020   Erectile dysfunction 01/12/2020   GERD (gastroesophageal reflux disease) 01/12/2020   Past Medical History:  Diagnosis Date   Arthritis    Chronic back pain    Family history of colon cancer    sister   GERD (gastroesophageal reflux disease)    History of kidney stones    Hyperlipidemia    Hypertension     Family History  Problem Relation Age of Onset   Heart disease Father 62       MI   Hypertension Father    Heart attack Father    Other Father        Small intestine removed but does not know why   Diabetes Sister    Heart disease Sister 15       MI   Colon cancer Sister    Colon polyps Sister    Colon polyps Sister    Healthy Sister    Healthy Sister    Healthy Sister    Other  Brother        MVA   Healthy Brother    Healthy Brother    Healthy Brother    Healthy Brother    Healthy Brother    Healthy Brother    Liver disease Neg Hx    Pancreatic cancer Neg Hx    Esophageal cancer Neg Hx    Stomach cancer Neg Hx     Past Surgical History:  Procedure Laterality Date   BACK SURGERY  04/11/2020   CHOLECYSTECTOMY N/A 10/01/2020   Procedure: LAPAROSCOPIC CHOLECYSTECTOMY;  Surgeon: Griselda Miner, MD;  Location: [facility];  Service: General;  Laterality: N/A;   COLONOSCOPY  1991   IBS   endocolon  09/15/2021   [facility], GERD   HAND SURGERY Bilateral    SHOULDER SURGERY Right 08/2021   SPINAL CORD STIMULATOR IMPLANT  2022   SPINAL CORD STIMULATOR REMOVAL  2022   TONSILLECTOMY     VASECTOMY     Social History   Occupational History   Not on file  Tobacco Use   Smoking status: Never   Smokeless tobacco: Never  Vaping Use   Vaping Use: Never used  Substance and Sexual Activity   Alcohol use: Not Currently    Comment: States quit around age 65   Drug use: Never   Sexual  activity: Not Currently    Birth control/protection: Surgical    Comment: vastectomy

## 2022-12-10 NOTE — Assessment & Plan Note (Signed)
On IM injections of testosterone 200 mg  q 10 d Hope to help muscle strength especially in the lower extremities, muscle mass, depression and apathy.  Potential benefits of a long term sex steroid  use as well as potential risks  and complications were explained to the patient and were aknowledged.

## 2022-12-10 NOTE — Progress Notes (Signed)
Adam Payor, PhD, LAT, ATC acting as a scribe for Adam Graham, MD.  Adam Vaughn is a 47 y.o. male who presents to [facility] today for R knee pain. Pt was last seen by Dr. Denyse Amass on 08/20/22 and was given a L knee steroid injection.  Today, pt c/o R knee pain after suffering a fall yesterday. The knee buckled on him causing the right knee to hyperflex underneath him. Having pain at medial and posterior aspects of the knee. Ambulating with a cane today. Has tried Hydrocodone and alternating heat/ice. Compression sleeve caused too much discomfort. Sx worse with prolonged sitting and ambulation. Sx causing night disturbance. Minimal swelling/bruising at time of injury. [name] He needed difficulty extending his knee and felt a big pop when he did so.  R knee swelling: minimal visible swelling Treatments tried: heat/ice, Hydrocodone (prescribed for back pain)  Dx imaging: 07/12/22 R knee XR 11/21/2021 left knee x-ray             11/03/2020 bilat knee standing x-ray 06/28/20 R and L knee XR  Pertinent review of systems: No fevers or chills  Relevant historical information: History of chronic back pain and back surgery.   Exam:  BP 122/70   Pulse 70   Ht 5\' 8"  (1.727 m)   Wt 198 lb (89.8 kg)   SpO2 98%   BMI 30.11 kg/m  General: Well Developed, well nourished, and in no acute distress.   MSK: Knee large joint effusion. Decreased motion. Tender palpation medial joint line. Guarding with stability exam testing this was nondiagnostic.  However he did have significant pain with MCL stress test. Positive medial McMurray's test with lots of pain. Strength reduced extension and flexion limited by pain.    Lab and Radiology Results  Procedure: Real-time Ultrasound Guided aspiration attempt right knee joint superior lateral space Device: Philips Affiniti 50G Images permanently stored and available for review in PACS Ultrasound evaluation prior to  aspiration revealed large joint effusion. Verbal informed consent obtained.  Discussed risks and benefits of procedure. Warned about infection, bleeding, hyperglycemia damage to structures among others. Patient expresses understanding and agreement Time-out conducted.   Noted no overlying erythema, induration, or other signs of local infection.   Skin prepped in a sterile fashion.   Local anesthesia: Topical Ethyl chloride.   With sterile technique and under real time ultrasound guidance: 2 mL of lidocaine injected into subcutaneous tissue achieving good anesthesia.  Skin was again sterilized prior to aspiration. 18-gauge needle was inserted into the knee joint.  Good position of the tip of the needle was seen on ultrasound.  No fluid was able to be aspirated despite several attempts at needle position changes and confirmation of needle position change. I used a separate 18-gauge needle and syringe after sterilizing the skin again and again despite good ultrasound confirmation of good needle position no fluid was able to be aspirated.  The procedure was discontinued at this point. Advised to call if fevers/chills, erythema, induration, drainage, or persistent bleeding.   Images permanently stored and available for review in the ultrasound unit.  Impression: Unsuccessful ultrasound-guided aspiration of the joint.   X-ray images right knee obtained today personally and independently interpreted. Mild medial compartment DJD.  No acute fractures are visible. Await formal radiology review      Assessment and Plan: 47 y.o. male with significant right knee pain with mechanical symptoms.  This occurred after an injury.  I am concerned he  has a significant meniscus tear possible bucket-handle type.  He does have a joint effusion which I could not aspirate.  This could be because it is hemarthrosis has started to coagulate a little bit and therefore difficult to aspirate.  Regardless he needs more  evaluation.  Plan for MRI knee to further evaluate source of knee pain and swelling and mechanical symptoms. Talked about pain management.  He has oxycodone prescribed by his PCP.  He can continue that.  Okay to use ibuprofen a little as well.  Recommend hinged knee brace and limited weightbearing as tolerated using crutches.  PDMP reviewed during this encounter. Orders Placed This Encounter  Procedures   DG Knee AP/LAT W/[place] Right    Standing Status:   Future    Number of Occurrences:   1    Standing Expiration Date:   01/09/2023    Order Specific Question:   Reason for Exam (SYMPTOM  OR DIAGNOSIS REQUIRED)    Answer:   right knee pain    Order Specific Question:   Preferred imaging location?    Answer:   [facility]   [place] LIMITED JOINT SPACE STRUCTURES LOW RIGHT(NO LINKED CHARGES)    Order Specific Question:   Reason for Exam (SYMPTOM  OR DIAGNOSIS REQUIRED)    Answer:   eval knee pain    Order Specific Question:   Preferred imaging location?    Answer:   [facility]   MR Knee Right Wo Contrast    Standing Status:   Future    Standing Expiration Date:   12/10/2023    Order Specific Question:   What is the patient's sedation requirement?    Answer:   No Sedation    Order Specific Question:   Does the patient have a pacemaker or implanted devices?    Answer:   No    Order Specific Question:   Preferred imaging location?    Answer:   [facility] (table limit-550lbs)   No orders of the defined types were placed in this encounter.    Discussed warning signs or symptoms. Please see discharge instructions. Patient expresses understanding.   The above documentation has been reviewed and is accurate and complete Adam Vaughn, M.D.

## 2022-12-11 ENCOUNTER — Encounter: Payer: Self-pay | Admitting: Cardiology

## 2022-12-11 ENCOUNTER — Encounter: Payer: Self-pay | Admitting: Family Medicine

## 2022-12-11 DIAGNOSIS — I2510 Atherosclerotic heart disease of native coronary artery without angina pectoris: Secondary | ICD-10-CM

## 2022-12-11 DIAGNOSIS — E7800 Pure hypercholesterolemia, unspecified: Secondary | ICD-10-CM

## 2022-12-11 NOTE — Progress Notes (Signed)
Right knee x-ray shows minimal arthritis changes.  No fractures are visible.

## 2022-12-13 ENCOUNTER — Other Ambulatory Visit: Payer: Self-pay | Admitting: Internal Medicine

## 2022-12-14 ENCOUNTER — Ambulatory Visit
Admission: RE | Admit: 2022-12-14 | Discharge: 2022-12-14 | Disposition: A | Discharge status: Home / Self Care | Payer: Medicare HMO | Source: Ambulatory Visit | Attending: Family Medicine | Admitting: Family Medicine

## 2022-12-14 DIAGNOSIS — M25461 Effusion, right knee: Secondary | ICD-10-CM | POA: Diagnosis not present

## 2022-12-14 DIAGNOSIS — M25561 Pain in right knee: Secondary | ICD-10-CM

## 2022-12-14 DIAGNOSIS — S83241A Other tear of medial meniscus, current injury, right knee, initial encounter: Secondary | ICD-10-CM | POA: Diagnosis not present

## 2022-12-17 ENCOUNTER — Encounter: Payer: Self-pay | Admitting: Pharmacist

## 2022-12-17 ENCOUNTER — Other Ambulatory Visit: Payer: Self-pay

## 2022-12-17 ENCOUNTER — Other Ambulatory Visit (HOSPITAL_COMMUNITY): Payer: Self-pay

## 2022-12-17 ENCOUNTER — Ambulatory Visit: Payer: Medicare HMO | Admitting: Podiatry

## 2022-12-17 MED ORDER — HYOSCYAMINE SULFATE 0.125 MG SL SUBL
0.1250 mg | SUBLINGUAL_TABLET | Freq: Four times a day (QID) | SUBLINGUAL | 2 refills | Status: AC | PRN
  Filled 2022-12-17: qty 30, 8d supply, fill #0
  Filled 2023-07-29: qty 30, 8d supply, fill #1
  Filled 2023-10-08: qty 30, 8d supply, fill #2

## 2022-12-19 ENCOUNTER — Other Ambulatory Visit: Payer: Medicare HMO

## 2022-12-24 ENCOUNTER — Ambulatory Visit: Payer: Medicare HMO | Admitting: Podiatry

## 2022-12-24 ENCOUNTER — Encounter: Payer: Self-pay | Admitting: Orthopedic Surgery

## 2022-12-24 ENCOUNTER — Other Ambulatory Visit: Payer: Self-pay

## 2022-12-24 ENCOUNTER — Encounter: Payer: Self-pay | Admitting: Podiatry

## 2022-12-24 DIAGNOSIS — H524 Presbyopia: Secondary | ICD-10-CM | POA: Diagnosis not present

## 2022-12-24 DIAGNOSIS — M25561 Pain in right knee: Secondary | ICD-10-CM

## 2022-12-24 DIAGNOSIS — M7751 Other enthesopathy of right foot: Secondary | ICD-10-CM

## 2022-12-24 DIAGNOSIS — M7752 Other enthesopathy of left foot: Secondary | ICD-10-CM | POA: Diagnosis not present

## 2022-12-24 NOTE — Progress Notes (Signed)
Knee MRI shows a tear of the medial meniscus.  This is potentially repairable based on the location.  I recommend referral to orthopedic surgery.  Would you like me to place a referral?

## 2022-12-24 NOTE — Telephone Encounter (Signed)
I called.  Discussed with him how he does have right knee medial meniscal tear which is likely causing his significant increase in pain with certain activities.  Had a fall yesterday as well.  He would like to proceed with arthroscopy and debridement of the right knee.  Blue sheet is done.

## 2022-12-25 NOTE — Progress Notes (Signed)
Subjective:   Patient ID: Adam Vaughn, male   DOB: 47 y.o.   MRN: [id number]   HPI Patient states seems to be doing better but still concerned about some swelling in the foot and also has improvement around the big toe joint bilateral but still sore with patient having had knee injury and is due for knee surgery   ROS      Objective:  Physical Exam  Neurovascular status intact muscle strength found to be adequate with patient noted to have inflammation around the first MPJ bilateral no crepitus associated with it but obvious signs of arthritis with discomfort within the tendon groove also.  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  Patient understands this and at this point is going to have his knee surgery done and then we will decide what may have to be done with his big toe joints with the possibility for long-term surgical intervention

## 2022-12-25 NOTE — Telephone Encounter (Signed)
Can you have him come in sometime this week to see either me or [facility].  Thanks

## 2022-12-26 ENCOUNTER — Ambulatory Visit (HOSPITAL_COMMUNITY)
Admission: RE | Admit: 2022-12-26 | Discharge: 2022-12-26 | Disposition: A | Discharge status: Home / Self Care | Payer: Medicare HMO | Source: Ambulatory Visit | Attending: Surgical | Admitting: Surgical

## 2022-12-26 ENCOUNTER — Ambulatory Visit: Payer: Medicare HMO | Admitting: Surgical

## 2022-12-26 DIAGNOSIS — M79661 Pain in right lower leg: Secondary | ICD-10-CM

## 2022-12-27 ENCOUNTER — Encounter: Payer: Self-pay | Admitting: Orthopedic Surgery

## 2022-12-27 NOTE — Progress Notes (Signed)
Office Visit Note   Patient: Adam Vaughn           Date of Birth: 1975/09/11           MRN: [id number] Visit Date: 12/26/2022 Requested by: Tresa Garter, MD [address] [place],  [place] [address] PCP: Plotnikov, Georgina Quint, MD  Subjective: Chief Complaint  Patient presents with   Right Knee - Pain    HPI: Adam Vaughn is a 47 y.o. male who presents to the office reporting right knee pain.  Has history of right knee posterior horn medial meniscal tear that he is planning to have surgery for on this coming Monday.  However, he had a new fall on Sunday.  He was stepping over a small fence.  As he stepped down with his right leg, his knee gave out on him without a popping sensation but when he fell, as he tried to get up it felt like his knee was "stuck" it was very stiff and it was hard for him to actually extend or flex the knee.  As he put weight on it he felt a popping sensation and he had relief of pain with increase in his range of motion back to normal.  Has had some increased swelling.  No new numbness or tingling.  No significant increase in swelling immediately at the time.  He has had no continued instability of the knee.  No new mechanical symptoms..                ROS: All systems reviewed are negative as they relate to the chief complaint within the history of present illness.  Patient denies fevers or chills.  Assessment & Plan: Visit Diagnoses:  1. Right calf pain     Plan: Patient is a 47 year old male who presents for evaluation of right knee pain.  He has right knee that gave out on him as he stepped down while stepping over a fence.  Did not feel a popping sensation until he tried to get up.  He had severe pain after the fall but the popping sensation was actually as he got up after his injury and restored his range of motion.  Seems that he had some sort of locking incident after the fall that was resolved when he attempted weightbearing.  This may  be related to the medial meniscal tear that he currently has that he is pursuing surgery for this coming Monday with Dr. August Saucer.  His ACL is stable on exam with no evidence of any other ligamentous injury on exam today either.  He has a small effusion.  We will plan to proceed with surgery on Monday and with his very unimpressive mechanism of injury, do not think we need new radiographs or MRI in the meantime.  Patient also has some calf tenderness and positive Denna Haggard' sign on exam today.  He does have history of prior superficial DVT in the right arm.  With this history and presentation, we will get ultrasound to rule out DVT.  This was found to be negative.  Follow-Up Instructions: No follow-ups on file.   Orders:  Orders Placed This Encounter  Procedures   VAS [place] LOWER EXTREMITY VENOUS (DVT)   No orders of the defined types were placed in this encounter.     Procedures: No procedures performed   Clinical Data: No additional findings.  Objective: Vital Signs: There were no vitals taken for this visit.  Physical Exam:  Constitutional: Patient  appears well-developed HEENT:  Head: Normocephalic Eyes:EOM are normal Neck: Normal range of motion Cardiovascular: Normal rate Pulmonary/chest: Effort normal Neurologic: Patient is alert Skin: Skin is warm Psychiatric: Patient has normal mood and affect  Ortho Exam: On exam, patient has small effusion present but no large effusion as would be expected with a possible ACL tear.  He has ACL that is stable on Lachman exam and by anterior drawer.  Stable to posterior drawer.  Stable to varus and valgus stress at 0 and 30 degrees in the right knee.  He has 0 degrees extension and 110 degrees knee flexion.  Positive calf tenderness and positive Homans' sign on exam today.  He is able to perform straight leg raise without extensor lag.  Tenderness over the medial joint line moderately and no tenderness over the lateral joint line.  Specialty  Comments:  EXAM: MRI LUMBAR SPINE WITHOUT CONTRAST   TECHNIQUE: Multiplanar, multisequence MR imaging of the lumbar spine was performed. No intravenous contrast was administered.   COMPARISON:  01/18/2021   FINDINGS: Segmentation:  Standard.   Alignment:  2 mm retrolisthesis of L3 on L4.   Vertebrae: No acute fracture, evidence of discitis, or aggressive bone lesion. 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   Conus medullaris and cauda equina: Conus extends to the T12-L1 level. Conus and cauda equina appear normal.   Paraspinal and other soft tissues: No acute paraspinal abnormality. Postsurgical changes in the posterior paraspinal soft tissues at L4-S1.   Disc levels:   Disc spaces: Posterior lumbar interbody fusion from L4 through S1.   T12-L1: No significant disc bulge. No neural foraminal stenosis. No central canal stenosis.   L1-L2: No significant disc bulge. No neural foraminal stenosis. No central canal stenosis.   L2-L3: Minimal broad-based disc bulge. No left foraminal stenosis. Mild right foraminal stenosis. No spinal stenosis.   L3-L4: Broad-based disc bulge flattening ventral thecal sac. Mild bilateral facet arthropathy. Mild bilateral foraminal stenosis. Mild spinal stenosis. Bilateral lateral recess stenosis.   L4-L5: Interbody fusion.  No foraminal or central canal stenosis.   L5-S1: Interbody fusion.  No foraminal or central canal stenosis.   IMPRESSION: 1. At L3-4 there is a broad-based disc bulge flattening ventral thecal sac. Mild bilateral facet arthropathy. Mild bilateral foraminal stenosis. Mild spinal stenosis. Bilateral lateral recess stenosis. 2. Posterior lumbar interbody fusion from L4 through S1 without foraminal or central canal stenosis. 3. No acute osseous injury of the lumbar spine.     Electronically Signed   By: Elige Ko M.D.   On: 05/22/2022 10:20  Imaging: VAS [place] LOWER EXTREMITY VENOUS (DVT)  Result  Date: 12/26/2022  Lower Venous DVT Study Patient Name:  Adam Vaughn [facility]  Date of Exam:   12/26/2022 Medical Rec #: [id number]           Accession #:    [id number] Date of Birth: 1975/08/11           Patient Gender: M Patient Age:   50 years Exam Location:  [facility] Procedure:      VAS [place] LOWER EXTREMITY VENOUS (DVT) Referring Phys: Harriette Bouillon --------------------------------------------------------------------------------  Indications: Pain. Other Indications: Meniscus tear of right knee. Comparison Study: No previous exams Performing Technologist: Jody Hill RVT, RDMS  Examination Guidelines: A complete evaluation includes B-mode imaging, spectral Doppler, color Doppler, and power Doppler as needed of all accessible portions of each vessel. Bilateral testing is considered an integral part of a complete examination. Limited examinations for reoccurring  indications may be performed as noted. The reflux portion of the exam is performed with the patient in reverse Trendelenburg.  +---------+---------------+---------+-----------+----------+--------------+ RIGHT    CompressibilityPhasicitySpontaneityPropertiesThrombus Aging +---------+---------------+---------+-----------+----------+--------------+ CFV      Full           Yes      Yes                                 +---------+---------------+---------+-----------+----------+--------------+ SFJ      Full                                                        +---------+---------------+---------+-----------+----------+--------------+ FV Prox  Full           Yes      Yes                                 +---------+---------------+---------+-----------+----------+--------------+ FV Mid   Full           Yes      Yes                                 +---------+---------------+---------+-----------+----------+--------------+ FV DistalFull           Yes      Yes                                  +---------+---------------+---------+-----------+----------+--------------+ PFV      Full                                                        +---------+---------------+---------+-----------+----------+--------------+ POP      Full           Yes      Yes                                 +---------+---------------+---------+-----------+----------+--------------+ PTV      Full                                                        +---------+---------------+---------+-----------+----------+--------------+ PERO     Full                                                        +---------+---------------+---------+-----------+----------+--------------+   +----+---------------+---------+-----------+----------+--------------+ LEFTCompressibilityPhasicitySpontaneityPropertiesThrombus Aging +----+---------------+---------+-----------+----------+--------------+ CFV Full           Yes      Yes                                 +----+---------------+---------+-----------+----------+--------------+  Summary: RIGHT: - There is no evidence of deep vein thrombosis in the lower extremity.  - No cystic structure found in the popliteal fossa.  LEFT: - No evidence of common femoral vein obstruction.  *See table(s) above for measurements and observations. Electronically signed by Coral Else MD on 12/26/2022 at 8:36:32 PM.    Final      PMFS History: Patient Active Problem List   Diagnosis Date Noted   Hypokalemia 12/10/2022   Foot pain, bilateral 11/12/2022   Hypogonadism male 09/19/2022   Chronic eczematous otitis externa of both ears 05/16/2022   Hypertrophy of inferior nasal turbinate 05/16/2022   Situational depression 05/14/2022   Coronary atherosclerosis 03/26/2022   Dyslipidemia 01/11/2022   Chronic pain syndrome 11/07/2021   Piriformis syndrome 11/07/2021   Bladder neck obstruction 09/04/2021   Asthmatic bronchitis 09/04/2021   Pain in right shoulder 06/29/2021    Meteorism 06/01/2021   Sinus congestion 02/28/2021   Vertigo 02/17/2021   Headache 02/17/2021   Fever 02/17/2021   Falls 02/17/2021   Allergic rhinitis 10/25/2020   Family history of acute polio 10/25/2020   Chest pain, atypical 10/10/2020   S/P laparoscopic cholecystectomy 10/10/2020   Hyperglycemia 10/10/2020   Acute cholecystitis 09/30/2020   Insomnia 09/26/2020   Knee pain 06/23/2020   Low vitamin B12 level 05/31/2020   Ankle sprain 05/31/2020   Constipation 05/31/2020   Degenerative spondylolisthesis 04/11/2020   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   DDD (degenerative disc disease), lumbar 01/20/2020   Left lumbar radiculitis 01/20/2020   Vitamin D deficiency 01/15/2020   Low back pain 01/12/2020   IBS (irritable bowel syndrome) 01/12/2020   Nephrolithiasis 01/12/2020   Erectile dysfunction 01/12/2020   GERD (gastroesophageal reflux disease) 01/12/2020   Past Medical History:  Diagnosis Date   Arthritis    Chronic back pain    Family history of colon cancer    sister   GERD (gastroesophageal reflux disease)    History of kidney stones    Hyperlipidemia    Hypertension     Family History  Problem Relation Age of Onset   Heart disease Father 21       MI   Hypertension Father    Heart attack Father    Other Father        Small intestine removed but does not know why   Diabetes Sister    Heart disease Sister 83       MI   Colon cancer Sister    Colon polyps Sister    Colon polyps Sister    Healthy Sister    Healthy Sister    Healthy Sister    Other Brother        MVA   Healthy Brother    Healthy Brother    Healthy Brother    Healthy Brother    Healthy Brother    Healthy Brother    Liver disease Neg Hx    Pancreatic cancer Neg Hx    Esophageal cancer Neg Hx    Stomach cancer Neg Hx     Past Surgical History:  Procedure Laterality Date   BACK SURGERY  04/11/2020   CHOLECYSTECTOMY N/A 10/01/2020   Procedure:  LAPAROSCOPIC CHOLECYSTECTOMY;  Surgeon: Griselda Miner, MD;  Location: [facility];  Service: General;  Laterality: N/A;   COLONOSCOPY  1991   IBS   endocolon  09/15/2021   [facility], GERD   HAND SURGERY Bilateral    SHOULDER SURGERY Right 08/2021  SPINAL CORD STIMULATOR IMPLANT  2022   SPINAL CORD STIMULATOR REMOVAL  2022   TONSILLECTOMY     VASECTOMY     Social History   Occupational History   Not on file  Tobacco Use   Smoking status: Never   Smokeless tobacco: Never  Vaping Use   Vaping Use: Never used  Substance and Sexual Activity   Alcohol use: Not Currently    Comment: States quit around age 86   Drug use: Never   Sexual activity: Not Currently    Birth control/protection: Surgical    Comment: vastectomy

## 2022-12-31 ENCOUNTER — Encounter: Payer: Self-pay | Admitting: Surgical

## 2022-12-31 ENCOUNTER — Encounter: Payer: Self-pay | Admitting: Orthopedic Surgery

## 2022-12-31 ENCOUNTER — Other Ambulatory Visit: Payer: Self-pay | Admitting: Surgical

## 2022-12-31 DIAGNOSIS — M94261 Chondromalacia, right knee: Secondary | ICD-10-CM | POA: Diagnosis not present

## 2022-12-31 DIAGNOSIS — G8918 Other acute postprocedural pain: Secondary | ICD-10-CM | POA: Diagnosis not present

## 2022-12-31 DIAGNOSIS — S83241A Other tear of medial meniscus, current injury, right knee, initial encounter: Secondary | ICD-10-CM | POA: Diagnosis not present

## 2022-12-31 DIAGNOSIS — S83231A Complex tear of medial meniscus, current injury, right knee, initial encounter: Secondary | ICD-10-CM | POA: Diagnosis not present

## 2022-12-31 MED ORDER — METHOCARBAMOL 500 MG PO TABS: 500.0000 mg | ORAL_TABLET | Freq: Three times a day (TID) | ORAL | 1 refills | Status: DC | PRN

## 2022-12-31 MED ORDER — CELECOXIB 100 MG PO CAPS: 100.0000 mg | ORAL_CAPSULE | Freq: Two times a day (BID) | ORAL | 0 refills | Status: DC

## 2022-12-31 MED ORDER — ASPIRIN 81 MG PO CHEW: 81.0000 mg | CHEWABLE_TABLET | Freq: Two times a day (BID) | ORAL | 0 refills | Status: DC

## 2023-01-01 ENCOUNTER — Encounter: Payer: Self-pay | Admitting: Orthopedic Surgery

## 2023-01-01 ENCOUNTER — Encounter: Payer: Self-pay | Admitting: *Deleted

## 2023-01-02 ENCOUNTER — Other Ambulatory Visit: Payer: Self-pay | Admitting: Surgical

## 2023-01-02 MED ORDER — CELECOXIB 100 MG PO CAPS: 100.0000 mg | ORAL_CAPSULE | Freq: Two times a day (BID) | ORAL | 0 refills | Status: DC

## 2023-01-02 MED ORDER — ASPIRIN 81 MG PO CHEW: 81.0000 mg | CHEWABLE_TABLET | Freq: Two times a day (BID) | ORAL | 0 refills | Status: AC

## 2023-01-02 NOTE — Telephone Encounter (Signed)
I called the patient.  He is having a fair amount of knee pain that came on yesterday.  Probably due to the numbing medicine wearing off.  He has no chest pain, shortness of breath, calf pain.  No swelling in his calf but does have swelling in his knee.  I recommended he take aspirin every day and resent the prescriptions for aspirin and Celebrex that I sent in on Monday.  I also recommended that he change the way that he is taking his chronic pain medication from 3 times a day to every 4-6 hours as needed.  He agreed with this plan.  He will see how this does and let us know if anything gets worse.

## 2023-01-02 NOTE — Telephone Encounter (Signed)
Okay for this.  

## 2023-01-06 NOTE — Telephone Encounter (Signed)
yes

## 2023-01-07 ENCOUNTER — Ambulatory Visit (INDEPENDENT_AMBULATORY_CARE_PROVIDER_SITE_OTHER): Payer: Medicare HMO | Admitting: Surgical

## 2023-01-07 DIAGNOSIS — Z9889 Other specified postprocedural states: Secondary | ICD-10-CM

## 2023-01-07 MED ORDER — DOXYCYCLINE HYCLATE 100 MG PO TABS: 100.0000 mg | ORAL_TABLET | Freq: Two times a day (BID) | ORAL | 0 refills | Status: DC

## 2023-01-08 ENCOUNTER — Ambulatory Visit (INDEPENDENT_AMBULATORY_CARE_PROVIDER_SITE_OTHER): Payer: Medicare HMO | Admitting: Emergency Medicine

## 2023-01-08 ENCOUNTER — Encounter: Payer: Self-pay | Admitting: Emergency Medicine

## 2023-01-08 ENCOUNTER — Encounter: Payer: Self-pay | Admitting: Internal Medicine

## 2023-01-08 VITALS — BP 122/74 | HR 80 | Temp 98.3°F | Ht 68.0 in | Wt 197.1 lb

## 2023-01-08 DIAGNOSIS — G894 Chronic pain syndrome: Secondary | ICD-10-CM | POA: Diagnosis not present

## 2023-01-08 DIAGNOSIS — R319 Hematuria, unspecified: Secondary | ICD-10-CM

## 2023-01-08 DIAGNOSIS — Z87442 Personal history of urinary calculi: Secondary | ICD-10-CM | POA: Diagnosis not present

## 2023-01-08 DIAGNOSIS — M545 Low back pain, unspecified: Secondary | ICD-10-CM | POA: Diagnosis not present

## 2023-01-08 LAB — URINALYSIS, ROUTINE W REFLEX MICROSCOPIC
Bilirubin Urine: NEGATIVE
Ketones, ur: NEGATIVE
Leukocytes,Ua: NEGATIVE
Nitrite: NEGATIVE
Specific Gravity, Urine: 1.025 (ref 1.000–1.030)
Total Protein, Urine: NEGATIVE
Urine Glucose: NEGATIVE
Urobilinogen, UA: 0.2 (ref 0.0–1.0)
pH: 6 (ref 5.0–8.0)

## 2023-01-08 LAB — COMPREHENSIVE METABOLIC PANEL
ALT: 59 U/L — ABNORMAL HIGH (ref 0–53)
AST: 24 U/L (ref 0–37)
Albumin: 4.4 g/dL (ref 3.5–5.2)
Alkaline Phosphatase: 83 U/L (ref 39–117)
BUN: 12 mg/dL (ref 6–23)
CO2: 29 mEq/L (ref 19–32)
Calcium: 9.8 mg/dL (ref 8.4–10.5)
Chloride: 103 mEq/L (ref 96–112)
Creatinine, Ser: 0.94 mg/dL (ref 0.40–1.50)
GFR: 96.63 mL/min (ref >60.00)
Glucose, Bld: 104 mg/dL — ABNORMAL HIGH (ref 70–99)
Potassium: 4.1 mEq/L (ref 3.5–5.1)
Sodium: 137 mEq/L (ref 135–145)
Total Bilirubin: 0.7 mg/dL (ref 0.2–1.2)
Total Protein: 7 g/dL (ref 6.0–8.3)

## 2023-01-08 LAB — CBC WITH DIFFERENTIAL/PLATELET
Basophils Absolute: 0 10*3/uL (ref 0.0–0.1)
Basophils Relative: 0.3 % (ref 0.0–3.0)
Eosinophils Absolute: 0.1 10*3/uL (ref 0.0–0.7)
Eosinophils Relative: 1 % (ref 0.0–5.0)
HCT: 48 % (ref 39.0–52.0)
Hemoglobin: 16 g/dL (ref 13.0–17.0)
Lymphocytes Relative: 23.4 % (ref 12.0–46.0)
Lymphs Abs: 1.6 10*3/uL (ref 0.7–4.0)
MCHC: 33.4 g/dL (ref 30.0–36.0)
MCV: 86.9 fl (ref 78.0–100.0)
Monocytes Absolute: 0.7 10*3/uL (ref 0.1–1.0)
Monocytes Relative: 10.5 % (ref 3.0–12.0)
Neutro Abs: 4.4 10*3/uL (ref 1.4–7.7)
Neutrophils Relative %: 64.8 % (ref 43.0–77.0)
Platelets: 283 10*3/uL (ref 150.0–400.0)
RBC: 5.52 Mil/uL (ref 4.22–5.81)
RDW: 12.3 % (ref 11.5–15.5)
WBC: 6.8 10*3/uL (ref 4.0–10.5)

## 2023-01-08 NOTE — Assessment & Plan Note (Signed)
Hematuria 2 nights ago Renal colic a possibility and adding to chronic pain Will check urinalysis today Recommend renal CT

## 2023-01-08 NOTE — Assessment & Plan Note (Signed)
Acute on chronic. Continue present pain management medications History of kidney stones.  Hematuria 2 nights ago Blood work and urinalysis done today Recommend renal CT scan

## 2023-01-08 NOTE — Progress Notes (Addendum)
Adam Vaughn 47 y.o. DOB: 05/18/1976   Chief Complaint  Patient presents with   Back Pain    Patient states he has a hx of back pain, lately it has been getting worse. Lower back, think it may be kidney stones  Radiating down his legs     HISTORY OF PRESENT ILLNESS: Acute problem visit today.  Patient of Dr. Trinna Post Plotnikov. This is a 47 y.o. male complaining of lumbar pain. Has history of chronic lumbar pain and chronic pain in general.  On daily oxycodone Has history of kidney stones.  Noticed blood in the urine 2 nights ago Recent right knee surgery. Pain is constant with some radiation to the legs.  Worse with movement.  Denies bowel or bladder problems. No other associated symptoms. No other complaints or medical concerns today.  Back Pain Pertinent negatives include no abdominal pain, chest pain, fever or headaches.     Prior to Admission medications   Medication Sig Start Date End Date Taking? Authorizing Provider  aspirin (ASPIRIN CHILDRENS) 81 MG chewable tablet Chew 1 tablet (81 mg total) by mouth 2 (two) times daily. To prevent blood clots 01/02/23 02/01/23 Yes Magnant, Charles L, PA-C  celecoxib (CELEBREX) 100 MG capsule Take 1 capsule (100 mg total) by mouth 2 (two) times daily. 01/02/23 01/02/24 Yes Magnant, Joycie Peek, PA-C  Docusate Calcium (STOOL SOFTENER PO) Take 1 tablet by mouth daily.   Yes [provider]  doxycycline (VIBRA-TABS) 100 MG tablet Take 1 tablet (100 mg total) by mouth 2 (two) times daily. 01/07/23  Yes Magnant, Joycie Peek, PA-C  esomeprazole (NEXIUM) 40 MG capsule Take 1 capsule (40 mg total) by mouth 2 (two) times daily before a meal. 11/12/22  Yes Plotnikov, Georgina Quint, MD  fluticasone (FLONASE) 50 MCG/ACT nasal spray Place 2 sprays into both nostrils daily. 03/14/22  Yes Plotnikov, Georgina Quint, MD  hyoscyamine (LEVSIN SL) 0.125 MG SL tablet Take 1 tablet (0.125 mg total) by mouth every 6 (six) hours as needed. 12/17/22  Yes Plotnikov, Georgina Quint,  MD  Lactulose 20 GM/30ML SOLN Take 30-60 mLs (20-40 g total) by mouth 2 (two) times daily as needed (severe constipation). 03/14/22  Yes Plotnikov, Georgina Quint, MD  methocarbamol (ROBAXIN) 500 MG tablet Take 1 tablet (500 mg total) by mouth every 8 (eight) hours as needed for muscle spasms. 12/31/22  Yes Magnant, Charles L, PA-C  methylcellulose (CITRUCEL) oral powder Take 1 packet by mouth daily. 11/01/21  Yes Rachael Fee, MD  metoprolol tartrate (LOPRESSOR) 100 MG tablet TAKE 2 HOURS PRIOR TO CT SCAN 10/11/22  Yes Crenshaw, Madolyn Frieze, MD  naproxen (NAPROSYN) 500 MG tablet Take 1 tablet (500 mg total) by mouth 2 (two) times daily as needed for moderate pain. 07/13/21  Yes Plotnikov, Georgina Quint, MD  NEEDLE, DISP, 23 G (BD DISP NEEDLE) 23G X 1" MISC Use for intramuscular injections. 09/19/22  Yes Plotnikov, Georgina Quint, MD  ondansetron (ZOFRAN) 4 MG tablet Take 1 tablet (4 mg total) by mouth every 8 (eight) hours as needed for nausea or vomiting. 07/12/22  Yes Plotnikov, Georgina Quint, MD  Oxycodone HCl 10 MG TABS Take 1 tablet (10 mg total) by mouth 3 (three) times daily as needed. 12/10/22  Yes Plotnikov, Georgina Quint, MD  Oxycodone HCl 10 MG TABS Take 1 tablet (10 mg total) by mouth 3 (three) times daily as needed. 12/10/22  Yes Plotnikov, Georgina Quint, MD  Plecanatide (TRULANCE) 3 MG TABS Take 1 tablet (3 mg total) by  mouth daily. For constipation 11/12/22  Yes Plotnikov, Georgina Quint, MD  pregabalin (LYRICA) 150 MG capsule Take 1 capsule (150 mg total) by mouth 2 (two) times daily. 11/12/22  Yes Plotnikov, Georgina Quint, MD  RELISTOR 150 MG TABS Take 1 tablet by mouth daily. 10/15/22  Yes [provider]  rosuvastatin (CRESTOR) 40 MG tablet Take 1 tablet (40 mg total) by mouth daily. 10/11/22  Yes Lewayne Bunting, MD  tamsulosin (FLOMAX) 0.4 MG CAPS capsule Take 1 capsule (0.4 mg total) by mouth daily. 07/12/22  Yes Plotnikov, Georgina Quint, MD  testosterone cypionate (DEPOTESTOSTERONE CYPIONATE) 200 MG/ML injection Use 200  mg IM every 10 days 11/14/22  Yes Plotnikov, Georgina Quint, MD  tiZANidine (ZANAFLEX) 4 MG capsule Take 1 capsule (4 mg total) by mouth 3 (three) times daily. 07/12/22  Yes Plotnikov, Georgina Quint, MD  Vitamin D-Vitamin K (K2 PLUS D3) [phone] MCG-UNIT TABS 1 po daily 09/11/22  Yes Plotnikov, Georgina Quint, MD  albuterol (PROAIR HFA) 108 (90 Base) MCG/ACT inhaler Inhale 2 puffs into the lungs every 6 (six) hours as needed for wheezing or shortness of breath. 11/01/21 11/01/22  Plotnikov, Georgina Quint, MD    Allergies  Allergen Reactions   Cymbalta [Duloxetine Hcl]     Nausea   Gabapentin     Twitching muscles    Patient Active Problem List   Diagnosis Date Noted   Hypokalemia 12/10/2022   Foot pain, bilateral 11/12/2022   Hypogonadism male 09/19/2022   Chronic eczematous otitis externa of both ears 05/16/2022   Hypertrophy of inferior nasal turbinate 05/16/2022   Situational depression 05/14/2022   Coronary atherosclerosis 03/26/2022   Dyslipidemia 01/11/2022   Chronic pain syndrome 11/07/2021   Piriformis syndrome 11/07/2021   Bladder neck obstruction 09/04/2021   Asthmatic bronchitis 09/04/2021   Pain in right shoulder 06/29/2021   Meteorism 06/01/2021   Sinus congestion 02/28/2021   Vertigo 02/17/2021   Headache 02/17/2021   Fever 02/17/2021   Falls 02/17/2021   Allergic rhinitis 10/25/2020   Family history of acute polio 10/25/2020   Chest pain, atypical 10/10/2020   S/P laparoscopic cholecystectomy 10/10/2020   Hyperglycemia 10/10/2020   Acute cholecystitis 09/30/2020   Insomnia 09/26/2020   Knee pain 06/23/2020   Low vitamin B12 level 05/31/2020   Ankle sprain 05/31/2020   Constipation 05/31/2020   Degenerative spondylolisthesis 04/11/2020   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   DDD (degenerative disc disease), lumbar 01/20/2020   Left lumbar radiculitis 01/20/2020   Vitamin D deficiency 01/15/2020   Low back pain 01/12/2020   IBS (irritable  bowel syndrome) 01/12/2020   Nephrolithiasis 01/12/2020   Erectile dysfunction 01/12/2020   GERD (gastroesophageal reflux disease) 01/12/2020    Past Medical History:  Diagnosis Date   Arthritis    Chronic back pain    Family history of colon cancer    sister   GERD (gastroesophageal reflux disease)    History of kidney stones    Hyperlipidemia    Hypertension     Past Surgical History:  Procedure Laterality Date   BACK SURGERY  04/11/2020   CHOLECYSTECTOMY N/A 10/01/2020   Procedure: LAPAROSCOPIC CHOLECYSTECTOMY;  Surgeon: Griselda Miner, MD;  Location: [facility];  Service: General;  Laterality: N/A;   COLONOSCOPY  1991   IBS   endocolon  09/15/2021   [facility], GERD   HAND SURGERY Bilateral    SHOULDER SURGERY Right 08/2021   SPINAL CORD STIMULATOR IMPLANT  2022   SPINAL CORD STIMULATOR  REMOVAL  2022   TONSILLECTOMY     VASECTOMY      Social History   Socioeconomic History   Marital status: Married    Spouse name: Not on file   Number of children: 2   Years of education: Not on file   Highest education level: GED or equivalent  Occupational History   Not on file  Tobacco Use   Smoking status: Never   Smokeless tobacco: Never  Vaping Use   Vaping Use: Never used  Substance and Sexual Activity   Alcohol use: Not Currently    Comment: States quit around age 26   Drug use: Never   Sexual activity: Not Currently    Birth control/protection: Surgical    Comment: vastectomy  Other Topics Concern   Not on file  Social History Narrative   Not on file   Social Determinants of Health   Financial Resource Strain: High Risk (11/11/2022)   Overall Financial Resource Strain (CARDIA)    Difficulty of Paying Living Expenses: Hard  Food Insecurity: Food Insecurity Present (11/11/2022)   Hunger Vital Sign    Worried About Running Out of Food in the Last Year: Sometimes true    Ran Out of Food in the Last Year: Never true  Transportation Needs: No Transportation Needs  (11/11/2022)   PRAPARE - Administrator, Civil Service (Medical): No    Lack of Transportation (Non-Medical): No  Physical Activity: Insufficiently Active (11/11/2022)   Exercise Vital Sign    Days of Exercise per Week: 3 days    Minutes of Exercise per Session: 10 min  Stress: Stress Concern Present (11/11/2022)   [employer] of Occupational Health - Occupational Stress Questionnaire    Feeling of Stress : Very much  Social Connections: Moderately Isolated (11/11/2022)   Social Connection and Isolation Panel [NHANES]    Frequency of Communication with Friends and Family: Three times a week    Frequency of Social Gatherings with Friends and Family: Never    Attends Religious Services: Never    Database administrator or Organizations: No    Attends Engineer, structural: Not on file    Marital Status: Married  Catering manager Violence: Not on file    Family History  Problem Relation Age of Onset   Heart disease Father 53       MI   Hypertension Father    Heart attack Father    Other Father        Small intestine removed but does not know why   Diabetes Sister    Heart disease Sister 21       MI   Colon cancer Sister    Colon polyps Sister    Colon polyps Sister    Healthy Sister    Healthy Sister    Healthy Sister    Other Brother        MVA   Healthy Brother    Healthy Brother    Healthy Brother    Healthy Brother    Healthy Brother    Healthy Brother    Liver disease Neg Hx    Pancreatic cancer Neg Hx    Esophageal cancer Neg Hx    Stomach cancer Neg Hx      Review of Systems  Constitutional: Negative.  Negative for chills and fever.  HENT: Negative.  Negative for congestion and sore throat.   Respiratory: Negative.  Negative for cough and shortness of breath.   Cardiovascular:  Negative.  Negative for chest pain and palpitations.  Gastrointestinal:  Negative for abdominal pain, diarrhea, nausea and vomiting.  Genitourinary:  Positive  for hematuria.  Musculoskeletal:  Positive for back pain.  Skin: Negative.  Negative for rash.  Neurological: Negative.  Negative for dizziness and headaches.  All other systems reviewed and are negative.   Vitals:   01/08/23 1040  BP: 122/74  Pulse: 80  Temp: 98.3 F (36.8 C)  SpO2: 98%    Physical Exam Vitals reviewed.  Constitutional:      Appearance: Normal appearance.  HENT:     Head: Normocephalic.  Eyes:     Extraocular Movements: Extraocular movements intact.  Cardiovascular:     Rate and Rhythm: Normal rate.  Pulmonary:     Effort: Pulmonary effort is normal.  Abdominal:     Palpations: Abdomen is soft.     Tenderness: There is no abdominal tenderness. There is no right CVA tenderness or left CVA tenderness.  Musculoskeletal:     Comments: Lower back: Old surgical scar.  No erythema.  Some muscle tenderness.  No spine tenderness.  Skin:    General: Skin is warm and dry.     Capillary Refill: Capillary refill takes less than 2 seconds.  Neurological:     General: No focal deficit present.     Mental Status: He is alert and oriented to person, place, and time.  Psychiatric:        Mood and Affect: Mood normal.        Behavior: Behavior normal.      ASSESSMENT & PLAN: A total of 33 minutes was spent with the patient and counseling/coordination of care regarding preparing for this visit, review of most recent office visit notes, review of multiple chronic medical conditions under management, review of all medications, pain management, possibility of kidney stone and need for workup, ED precautions, prognosis, documentation and need for follow-up.  Problem List Items Addressed This Visit       Other   Low back pain - Primary    Acute on chronic. Continue present pain management medications History of kidney stones.  Hematuria 2 nights ago Blood work and urinalysis done today Recommend renal CT scan      Relevant Orders   CBC with Differential/Platelet    Comprehensive metabolic panel   Urinalysis   CT RENAL STONE STUDY   Chronic pain syndrome   Hematuria    Most likely secondary to kidney stone Continue pain management ED precautions given Recommend renal CT Needs follow-up with PCP      Relevant Orders   CBC with Differential/Platelet   Comprehensive metabolic panel   Urinalysis   CT RENAL STONE STUDY   History of kidney stones    Hematuria 2 nights ago Renal colic a possibility and adding to chronic pain Will check urinalysis today Recommend renal CT      Relevant Orders   CBC with Differential/Platelet   Comprehensive metabolic panel   Urinalysis   CT RENAL STONE STUDY   Patient Instructions  Kidney Stones Kidney stones are rock-like masses that form inside of the kidneys. Kidneys are organs that make pee (urine). A kidney stone may move into other parts of the urinary tract, including: The tubes that connect the kidneys to the bladder (ureters). The bladder. The tube that carries urine out of the body (urethra). Kidney stones can cause very bad pain and can block the flow of pee. The stone usually leaves your body through your  pee. A doctor may need to take out the stone. What are the causes? Kidney stones may be caused by: Too much calcium in the body. This may be caused by too much parathyroid hormone in the blood. Uric acid crystals in the bladder. The body makes uric acid when you eat certain foods. Narrowing of one or both of the ureters. A kidney blockage that you were born with. Past surgery on the kidney or the ureters. What increases the risk? You are more likely to develop this condition if: You have had a kidney stone in the past. Other people in your family have had kidney stones. You do not drink enough water. You eat a diet that is high in protein, salt (sodium), or sugar. You are very overweight (obese). What are the signs or symptoms? Symptoms of a kidney stone may include: Pain in the side  of the belly, right below the ribs. Pain usually spreads to the groin. Needing to pee often or right away. Pain when peeing. Blood in your pee. Feeling like you may vomit (nauseous). Vomiting. Fever and chills. How is this treated? Treatment depends on the size, location, and makeup of the kidney stones. The stones will often pass out of the body when you pee. You may need to: Drink more fluid to help pass the stone. In some cases, you may be given fluids through an IV tube at the hospital. Take medicine for pain. Change your diet to help keep kidney stones from coming back. Sometimes, you may need: A procedure to break up kidney stones using a beam of light (laser) or shock waves. Surgery to remove the kidney stones. Follow these instructions at home: Medicines Take over-the-counter and prescription medicines only as told by your doctor. Ask your doctor if the medicine prescribed to you requires you to avoid driving or using machinery. Eating and drinking Drink enough fluid to keep your pee pale yellow. You may be told to drink at least 8-10 glasses of water each day. This will help you pass the stone. If told by your doctor, change your diet. You may be told to: Limit how much salt you eat. Eat more fruits and vegetables. Limit how much meat, poultry, fish, and eggs you eat. Follow instructions from your doctor about what you may eat and drink. General instructions Collect pee samples as told by your doctor. You may need to collect a pee sample: 24 hours after a stone comes out. 8-12 weeks after a stone comes out, and every 6-12 months after that. Strain your pee every time you pee. Use the strainer that your doctor recommends. Do not throw out the stone. Keep it so that it can be tested by your doctor. Keep all follow-up visits. You may need X-rays and ultrasounds to make sure the stone has come out. How is this prevented? To prevent another kidney stone: Drink enough fluid to  keep your pee pale yellow. This is the best way to prevent kidney stones. Eat healthy foods. Avoid certain foods as told by your doctor. You may be told to eat less protein. Stay at a healthy weight. Where to find more information National Kidney Foundation (NKF): kidney.org [facility]): urologyhealth.org Contact a doctor if: You have pain that gets worse or does not get better with medicine. Get help right away if: You have a fever or chills. You get very bad pain. You get new pain in your belly. You faint. You cannot pee. This information is not  intended to replace advice given to you by your health care provider. Make sure you discuss any questions you have with your health care provider. Document Revised: 02/09/2022 Document Reviewed: 02/09/2022 Elsevier Patient Education  2024 Elsevier Inc.    Edwina Barth, MD [facility]

## 2023-01-08 NOTE — Patient Instructions (Signed)
Kidney Stones Kidney stones are rock-like masses that form inside of the kidneys. Kidneys are organs that make pee (urine). A kidney stone may move into other parts of the urinary tract, including: The tubes that connect the kidneys to the bladder (ureters). The bladder. The tube that carries urine out of the body (urethra). Kidney stones can cause very bad pain and can block the flow of pee. The stone usually leaves your body through your pee. A doctor may need to take out the stone. What are the causes? Kidney stones may be caused by: Too much calcium in the body. This may be caused by too much parathyroid hormone in the blood. Uric acid crystals in the bladder. The body makes uric acid when you eat certain foods. Narrowing of one or both of the ureters. A kidney blockage that you were born with. Past surgery on the kidney or the ureters. What increases the risk? You are more likely to develop this condition if: You have had a kidney stone in the past. Other people in your family have had kidney stones. You do not drink enough water. You eat a diet that is high in protein, salt (sodium), or sugar. You are very overweight (obese). What are the signs or symptoms? Symptoms of a kidney stone may include: Pain in the side of the belly, right below the ribs. Pain usually spreads to the groin. Needing to pee often or right away. Pain when peeing. Blood in your pee. Feeling like you may vomit (nauseous). Vomiting. Fever and chills. How is this treated? Treatment depends on the size, location, and makeup of the kidney stones. The stones will often pass out of the body when you pee. You may need to: Drink more fluid to help pass the stone. In some cases, you may be given fluids through an IV tube at the hospital. Take medicine for pain. Change your diet to help keep kidney stones from coming back. Sometimes, you may need: A procedure to break up kidney stones using a beam of light (laser)  or shock waves. Surgery to remove the kidney stones. Follow these instructions at home: Medicines Take over-the-counter and prescription medicines only as told by your doctor. Ask your doctor if the medicine prescribed to you requires you to avoid driving or using machinery. Eating and drinking Drink enough fluid to keep your pee pale yellow. You may be told to drink at least 8-10 glasses of water each day. This will help you pass the stone. If told by your doctor, change your diet. You may be told to: Limit how much salt you eat. Eat more fruits and vegetables. Limit how much meat, poultry, fish, and eggs you eat. Follow instructions from your doctor about what you may eat and drink. General instructions Collect pee samples as told by your doctor. You may need to collect a pee sample: 24 hours after a stone comes out. 8-12 weeks after a stone comes out, and every 6-12 months after that. Strain your pee every time you pee. Use the strainer that your doctor recommends. Do not throw out the stone. Keep it so that it can be tested by your doctor. Keep all follow-up visits. You may need X-rays and ultrasounds to make sure the stone has come out. How is this prevented? To prevent another kidney stone: Drink enough fluid to keep your pee pale yellow. This is the best way to prevent kidney stones. Eat healthy foods. Avoid certain foods as told by your doctor. You   may be told to eat less protein. Stay at a healthy weight. Where to find more information National Kidney Foundation (NKF): kidney.org [facility] (UCF): urologyhealth.org Contact a doctor if: You have pain that gets worse or does not get better with medicine. Get help right away if: You have a fever or chills. You get very bad pain. You get new pain in your belly. You faint. You cannot pee. This information is not intended to replace advice given to you by your health care provider. Make sure you discuss any  questions you have with your health care provider. Document Revised: 02/09/2022 Document Reviewed: 02/09/2022 Elsevier Patient Education  2024 Elsevier Inc.  

## 2023-01-08 NOTE — Assessment & Plan Note (Signed)
Most likely secondary to kidney stone Continue pain management ED precautions given Recommend renal CT Needs follow-up with PCP

## 2023-01-09 ENCOUNTER — Ambulatory Visit: Payer: Medicare HMO | Admitting: Orthopedic Surgery

## 2023-01-09 NOTE — Telephone Encounter (Signed)
Will keep ordered scan as is.  Thanks.

## 2023-01-10 ENCOUNTER — Other Ambulatory Visit: Payer: Medicare HMO

## 2023-01-11 ENCOUNTER — Encounter: Payer: Self-pay | Admitting: Surgical

## 2023-01-11 DIAGNOSIS — I2510 Atherosclerotic heart disease of native coronary artery without angina pectoris: Secondary | ICD-10-CM | POA: Diagnosis not present

## 2023-01-11 DIAGNOSIS — E7800 Pure hypercholesterolemia, unspecified: Secondary | ICD-10-CM | POA: Diagnosis not present

## 2023-01-11 NOTE — Progress Notes (Signed)
Post-Op Visit Note   Patient: Adam Vaughn           Date of Birth: May 23, 1976           MRN: [id number] Visit Date: 01/07/2023 PCP: Tresa Garter, MD   Assessment & Plan:  Chief Complaint:  Chief Complaint  Patient presents with   Right Knee - Routine Post Op    RIGHT KNEE SCOPE (surgery date 12-31-22)   Visit Diagnoses:  1. S/P right knee arthroscopy     Plan: Patient is a 47 year old male who presents s/p right knee arthroscopy with meniscal debridement on 12/31/2022.  He reports that his pain was initially very well-controlled up until the first or second day out from surgery and then he had severe worsening of pain.  This has been steadily improving since that initial onset.  He denies any fevers or chills.  No chest pain or shortness of breath.  He has no worsening of his typical Pain from his tight calves according to him.  He has been able to put some weight on the operative leg with the assistance of crutches.  Working on doing some knee range of motion exercises but not able to extend his knee all the way yet.  Back pain has been increasing since surgery.  Taking aspirin for DVT prophylaxis twice daily.  Has been taking his temperature with no temperature above 100.4 degrees.  On exam, patient has small effusion.  3 degrees extension and 75 degrees of knee flexion.  No significant calf tenderness.  Negative Homans' sign.  Patient is able to perform straight leg raise with quadricep firing.  Incisions are healing well.  Sutures intact and these were removed and replaced with Steri-Strips today.  There is some erythema that is surrounding the lateral portal incision.  There is no expressible drainage.  No other signs of infection.    Plan to continue with knee range of motion exercises.  Try to continue increasing his weightbearing status and wean off the crutches over the next week or 2.  Prescribe 1 week course of doxycycline due to the redness surrounding his lateral  portal incision.  See him back in 2 weeks for clinical recheck regarding his range of motion and his incisions.  Call with any concerns in the meantime.  Continue with aspirin for DVT prophylaxis.  Follow-Up Instructions: No follow-ups on file.   Orders:  No orders of the defined types were placed in this encounter.  Meds ordered this encounter  Medications   doxycycline (VIBRA-TABS) 100 MG tablet    Sig: Take 1 tablet (100 mg total) by mouth 2 (two) times daily.    Dispense:  14 tablet    Refill:  0    Imaging: No results found.  PMFS History: Patient Active Problem List   Diagnosis Date Noted   Hematuria 01/08/2023   History of kidney stones 01/08/2023   Hypokalemia 12/10/2022   Foot pain, bilateral 11/12/2022   Hypogonadism male 09/19/2022   Chronic eczematous otitis externa of both ears 05/16/2022   Hypertrophy of inferior nasal turbinate 05/16/2022   Situational depression 05/14/2022   Coronary atherosclerosis 03/26/2022   Dyslipidemia 01/11/2022   Chronic pain syndrome 11/07/2021   Piriformis syndrome 11/07/2021   Bladder neck obstruction 09/04/2021   Asthmatic bronchitis 09/04/2021   Pain in right shoulder 06/29/2021   Meteorism 06/01/2021   Sinus congestion 02/28/2021   Vertigo 02/17/2021   Headache 02/17/2021   Fever 02/17/2021   Falls 02/17/2021  Allergic rhinitis 10/25/2020   Family history of acute polio 10/25/2020   Chest pain, atypical 10/10/2020   S/P laparoscopic cholecystectomy 10/10/2020   Hyperglycemia 10/10/2020   Acute cholecystitis 09/30/2020   Insomnia 09/26/2020   Knee pain 06/23/2020   Low vitamin B12 level 05/31/2020   Ankle sprain 05/31/2020   Constipation 05/31/2020   Degenerative spondylolisthesis 04/11/2020   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   DDD (degenerative disc disease), lumbar 01/20/2020   Left lumbar radiculitis 01/20/2020   Vitamin D deficiency 01/15/2020   Low back pain 01/12/2020    IBS (irritable bowel syndrome) 01/12/2020   Nephrolithiasis 01/12/2020   Erectile dysfunction 01/12/2020   GERD (gastroesophageal reflux disease) 01/12/2020   Past Medical History:  Diagnosis Date   Arthritis    Chronic back pain    Family history of colon cancer    sister   GERD (gastroesophageal reflux disease)    History of kidney stones    Hyperlipidemia    Hypertension     Family History  Problem Relation Age of Onset   Heart disease Father 21       MI   Hypertension Father    Heart attack Father    Other Father        Small intestine removed but does not know why   Diabetes Sister    Heart disease Sister 30       MI   Colon cancer Sister    Colon polyps Sister    Colon polyps Sister    Healthy Sister    Healthy Sister    Healthy Sister    Other Brother        MVA   Healthy Brother    Healthy Brother    Healthy Brother    Healthy Brother    Healthy Brother    Healthy Brother    Liver disease Neg Hx    Pancreatic cancer Neg Hx    Esophageal cancer Neg Hx    Stomach cancer Neg Hx     Past Surgical History:  Procedure Laterality Date   BACK SURGERY  04/11/2020   CHOLECYSTECTOMY N/A 10/01/2020   Procedure: LAPAROSCOPIC CHOLECYSTECTOMY;  Surgeon: Chevis Pretty III, MD;  Location: [facility];  Service: General;  Laterality: N/A;   COLONOSCOPY  1991   IBS   endocolon  09/15/2021   [facility], GERD   HAND SURGERY Bilateral    SHOULDER SURGERY Right 08/2021   SPINAL CORD STIMULATOR IMPLANT  2022   SPINAL CORD STIMULATOR REMOVAL  2022   TONSILLECTOMY     VASECTOMY     Social History   Occupational History   Not on file  Tobacco Use   Smoking status: Never   Smokeless tobacco: Never  Vaping Use   Vaping status: Never Used  Substance and Sexual Activity   Alcohol use: Not Currently    Comment: States quit around age 4   Drug use: Never   Sexual activity: Not Currently    Birth control/protection: Surgical    Comment: vastectomy

## 2023-01-12 LAB — HEPATIC FUNCTION PANEL
ALT: 54 IU/L — ABNORMAL HIGH (ref 0–44)
AST: 28 IU/L (ref 0–40)
Albumin: 4.7 g/dL (ref 4.1–5.1)
Alkaline Phosphatase: 104 IU/L (ref 44–121)
Bilirubin Total: 0.5 mg/dL (ref 0.0–1.2)
Bilirubin, Direct: 0.13 mg/dL (ref 0.00–0.40)
Total Protein: 6.5 g/dL (ref 6.0–8.5)

## 2023-01-12 LAB — LIPID PANEL
Chol/HDL Ratio: 3.4 ratio (ref 0.0–5.0)
Cholesterol, Total: 164 mg/dL (ref 100–199)
HDL: 48 mg/dL (ref >39)
LDL Chol Calc (NIH): 91 mg/dL (ref 0–99)
Triglycerides: 141 mg/dL (ref 0–149)
VLDL Cholesterol Cal: 25 mg/dL (ref 5–40)

## 2023-01-14 ENCOUNTER — Encounter: Payer: Self-pay | Admitting: Cardiology

## 2023-01-14 ENCOUNTER — Telehealth: Payer: Self-pay | Admitting: *Deleted

## 2023-01-14 DIAGNOSIS — E7800 Pure hypercholesterolemia, unspecified: Secondary | ICD-10-CM

## 2023-01-14 MED ORDER — EZETIMIBE 10 MG PO TABS
10.0000 mg | ORAL_TABLET | Freq: Every day | ORAL | 3 refills | Status: DC
Start: 2023-01-14 — End: 2023-12-24

## 2023-01-14 NOTE — Telephone Encounter (Signed)
-----   Message from Olga Millers sent at 01/12/2023  3:05 PM EDT ----- LDL not at goal; add zetia 10 mg daily; lipids and liver 8 weeks Olga Millers

## 2023-01-14 NOTE — Telephone Encounter (Signed)
Pt has reviewed results via my chart  New script sent to the pharmacy  Lab orders mailed to the pt  

## 2023-01-17 NOTE — Telephone Encounter (Signed)
Please advise insurance needs two identifiers to be listed on patient last OV. Please add DOB to office visit. Will resubmit to get scan approved by insurance

## 2023-01-17 NOTE — Telephone Encounter (Signed)
What do you need me to do?

## 2023-01-21 ENCOUNTER — Other Ambulatory Visit: Payer: Self-pay | Admitting: Internal Medicine

## 2023-01-21 ENCOUNTER — Ambulatory Visit: Payer: Medicare HMO | Admitting: Surgical

## 2023-01-21 DIAGNOSIS — Z9889 Other specified postprocedural states: Secondary | ICD-10-CM

## 2023-01-25 ENCOUNTER — Encounter: Payer: Self-pay | Admitting: Surgical

## 2023-01-25 NOTE — Progress Notes (Signed)
Post-Op Visit Note   Patient: Adam Vaughn           Date of Birth: 1975-11-20           MRN: [id number] Visit Date: 01/21/2023 PCP: Tresa Garter, MD   Assessment & Plan:  Chief Complaint:  Chief Complaint  Patient presents with   Right Knee - Routine Post Op   Visit Diagnoses: No diagnosis found.  Plan: Patient is a 47 year old male who presents s/p right knee arthroscopy with meniscal debridement on 12/31/2022.  Still having some continued pain in the back of the knee and soreness around his kneecap with occasional subjective instability of the knee.  Overall he feels significantly improved compared with how he was doing at his last office visit 2 weeks ago.  He is now about 3 weeks out from the procedure.  He is ambulating without any crutches or assistive devices at this point.  No fevers or chills.  The lateral portal incision appears to be healing with some eschar over the incision that was debrided to reveal a small amount of Vicryl suture that was removed.  There is reduced erythema compared with the last exam.  He will monitor this and let us know if there are any concerning signs of infection around this portal incision in the next several weeks.  On exam, he has 2 degrees extension and about 90 degrees of knee flexion with no calf tenderness.  He has good quad strength and is able to perform straight leg raise without extensor lag.  Small effusion is present.  Plan is to continue with home exercise program.  Recommended stationary bike in order to optimize knee range of motion.  Follow-up in 4 weeks for clinical recheck with Dr. August Saucer or myself.[name]  He is having some increased pain in his left knee and right shoulder.  He has had prior [facility] joint injection.  He would like to consider [facility] joint injection and left knee injection at his next appointment.  It is too soon after surgery to consider this at this point but at his next appointment which will be about 2 months out, this  should be doable.  Follow-Up Instructions: No follow-ups on file.   Orders:  No orders of the defined types were placed in this encounter.  No orders of the defined types were placed in this encounter.   Imaging: No results found.  PMFS History: Patient Active Problem List   Diagnosis Date Noted   Hematuria 01/08/2023   History of kidney stones 01/08/2023   Hypokalemia 12/10/2022   Foot pain, bilateral 11/12/2022   Hypogonadism male 09/19/2022   Chronic eczematous otitis externa of both ears 05/16/2022   Hypertrophy of inferior nasal turbinate 05/16/2022   Situational depression 05/14/2022   Coronary atherosclerosis 03/26/2022   Dyslipidemia 01/11/2022   Chronic pain syndrome 11/07/2021   Piriformis syndrome 11/07/2021   Bladder neck obstruction 09/04/2021   Asthmatic bronchitis 09/04/2021   Pain in right shoulder 06/29/2021   Meteorism 06/01/2021   Sinus congestion 02/28/2021   Vertigo 02/17/2021   Headache 02/17/2021   Fever 02/17/2021   Falls 02/17/2021   Allergic rhinitis 10/25/2020   Family history of acute polio 10/25/2020   Chest pain, atypical 10/10/2020   S/P laparoscopic cholecystectomy 10/10/2020   Hyperglycemia 10/10/2020   Acute cholecystitis 09/30/2020   Insomnia 09/26/2020   Knee pain 06/23/2020   Low vitamin B12 level 05/31/2020   Ankle sprain 05/31/2020   Constipation 05/31/2020   Degenerative  spondylolisthesis 04/11/2020   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   DDD (degenerative disc disease), lumbar 01/20/2020   Left lumbar radiculitis 01/20/2020   Vitamin D deficiency 01/15/2020   Low back pain 01/12/2020   IBS (irritable bowel syndrome) 01/12/2020   Nephrolithiasis 01/12/2020   Erectile dysfunction 01/12/2020   GERD (gastroesophageal reflux disease) 01/12/2020   Past Medical History:  Diagnosis Date   Arthritis    Chronic back pain    Family history of colon cancer    sister   GERD  (gastroesophageal reflux disease)    History of kidney stones    Hyperlipidemia    Hypertension     Family History  Problem Relation Age of Onset   Heart disease Father 25       MI   Hypertension Father    Heart attack Father    Other Father        Small intestine removed but does not know why   Diabetes Sister    Heart disease Sister 78       MI   Colon cancer Sister    Colon polyps Sister    Colon polyps Sister    Healthy Sister    Healthy Sister    Healthy Sister    Other Brother        MVA   Healthy Brother    Healthy Brother    Healthy Brother    Healthy Brother    Healthy Brother    Healthy Brother    Liver disease Neg Hx    Pancreatic cancer Neg Hx    Esophageal cancer Neg Hx    Stomach cancer Neg Hx     Past Surgical History:  Procedure Laterality Date   BACK SURGERY  04/11/2020   CHOLECYSTECTOMY N/A 10/01/2020   Procedure: LAPAROSCOPIC CHOLECYSTECTOMY;  Surgeon: Griselda Miner, MD;  Location: [facility];  Service: General;  Laterality: N/A;   COLONOSCOPY  1991   IBS   endocolon  09/15/2021   [facility], GERD   HAND SURGERY Bilateral    SHOULDER SURGERY Right 08/2021   SPINAL CORD STIMULATOR IMPLANT  2022   SPINAL CORD STIMULATOR REMOVAL  2022   TONSILLECTOMY     VASECTOMY     Social History   Occupational History   Not on file  Tobacco Use   Smoking status: Never   Smokeless tobacco: Never  Vaping Use   Vaping status: Never Used  Substance and Sexual Activity   Alcohol use: Not Currently    Comment: States quit around age 85   Drug use: Never   Sexual activity: Not Currently    Birth control/protection: Surgical    Comment: vastectomy

## 2023-01-29 ENCOUNTER — Telehealth: Payer: Self-pay | Admitting: Family Medicine

## 2023-01-29 DIAGNOSIS — M5116 Intervertebral disc disorders with radiculopathy, lumbar region: Secondary | ICD-10-CM | POA: Diagnosis not present

## 2023-01-29 DIAGNOSIS — M9901 Segmental and somatic dysfunction of cervical region: Secondary | ICD-10-CM | POA: Diagnosis not present

## 2023-01-29 DIAGNOSIS — M9903 Segmental and somatic dysfunction of lumbar region: Secondary | ICD-10-CM | POA: Diagnosis not present

## 2023-01-29 DIAGNOSIS — M50322 Other cervical disc degeneration at C5-C6 level: Secondary | ICD-10-CM | POA: Diagnosis not present

## 2023-01-29 NOTE — Telephone Encounter (Signed)
Patient had surgery on his right knee a month ago but is now having a lot of pain in the left knee in addition to his on going back pain. His wife called stating that he is "breaking down" because of the pain and it is very hard for him to sleep at night.  They mentioned an injection in his left knee but did not know if this would be too close to his recent surgery (all though it is the other knee).  Please advise.  He is scheduled with Dr Denyse Amass for Thursday.

## 2023-01-30 ENCOUNTER — Other Ambulatory Visit: Payer: Medicare HMO

## 2023-01-30 ENCOUNTER — Ambulatory Visit
Admission: RE | Admit: 2023-01-30 | Discharge: 2023-01-30 | Disposition: A | Discharge status: Home / Self Care | Payer: Medicare HMO | Source: Ambulatory Visit | Attending: Emergency Medicine | Admitting: Emergency Medicine

## 2023-01-30 DIAGNOSIS — N200 Calculus of kidney: Secondary | ICD-10-CM | POA: Diagnosis not present

## 2023-01-30 DIAGNOSIS — M545 Low back pain, unspecified: Secondary | ICD-10-CM

## 2023-01-30 DIAGNOSIS — R109 Unspecified abdominal pain: Secondary | ICD-10-CM | POA: Diagnosis not present

## 2023-01-30 DIAGNOSIS — R319 Hematuria, unspecified: Secondary | ICD-10-CM

## 2023-01-30 DIAGNOSIS — Z87442 Personal history of urinary calculi: Secondary | ICD-10-CM

## 2023-01-30 NOTE — Telephone Encounter (Signed)
Forwarding to Dr. Corey to review and advise.  

## 2023-01-31 ENCOUNTER — Ambulatory Visit: Payer: Medicare HMO | Admitting: Family Medicine

## 2023-01-31 ENCOUNTER — Other Ambulatory Visit: Payer: Self-pay

## 2023-01-31 VITALS — BP 144/92 | HR 91 | Ht 68.0 in | Wt 200.0 lb

## 2023-01-31 DIAGNOSIS — G8929 Other chronic pain: Secondary | ICD-10-CM

## 2023-01-31 DIAGNOSIS — M25562 Pain in left knee: Secondary | ICD-10-CM | POA: Diagnosis not present

## 2023-01-31 NOTE — Patient Instructions (Addendum)
Thank you for coming in today.   You received an injection today. Seek immediate medical attention if the joint becomes red, extremely painful, or is oozing fluid.   Contact Dr Alvester Morin about another back injection.   If this knee injection today does not work let me know and I can order an MRI.

## 2023-01-31 NOTE — Progress Notes (Signed)
[name], PhD, LAT, ATC acting as a scribe for [name], MD.  Adam Vaughn is a 47 y.o. male who presents to [facility] today for back and knee pain. Pt was last seen by Dr. Denyse Amass on 12/10/22 and his R knee was unsuccessfully aspirated. R knee MRI ordered. He underwent a R knee arthroscopy with meniscal debridement on 12/31/2022. Last L knee steroid injection was on 08/20/22.  Today, pt reports L pain mid-June. R knee is still recovering from surgery. His low back is also bothering him, locating pain across both sides of his low back.  He sees Dr. Alvester Morin for back pain and pain rating down his leg thought to be lumbar radiculopathy.  He had a L2-3 epidural steroid injection in May of this year.  Radiating pain: yes- lateral aspect of his R leg LE numbness/tingling: yes- both legs to feet LE weakness: yes Aggravates: any activity Treatments tried: prior injections, heat, ice, icy hot  Dx imaging: 12/14/22 R knee MRI 07/12/22 R knee XR 03/29/23 L-spine XR 11/21/2021 L knee XR             11/03/2020 bilat knee standing XR 06/28/20 R and L knee XR  Pertinent review of systems: No fevers or chills  Relevant historical information: Recent right knee meniscus debridement   Exam:  BP (!) 144/92   Pulse 91   Ht 5\' 8"  (1.727 m)   Wt 200 lb (90.7 kg)   SpO2 98%   BMI 30.41 kg/m  General: Well Developed, well nourished, and in no acute distress.   MSK: Left knee normal-appearing Normal motion with crepitation. Tender palpation medial joint line. Stable ligamentous exam. Positive McMurray's test.    Lab and Radiology Results  Procedure: Real-time Ultrasound Guided Injection of left knee joint superior lateral patella space Device: Philips Affiniti 50G/GE Logiq Images permanently stored and available for review in PACS Verbal informed consent obtained.  Discussed risks and benefits of procedure. Warned about infection, bleeding,  hyperglycemia damage to structures among others. Patient expresses understanding and agreement Time-out conducted.   Noted no overlying erythema, induration, or other signs of local infection.   Skin prepped in a sterile fashion.   Local anesthesia: Topical Ethyl chloride.   With sterile technique and under real time ultrasound guidance: 40 mg of Kenalog and 2 mL of Marcaine injected into knee joint. Fluid seen entering the joint capsule.   Completed without difficulty   Pain immediately resolved suggesting accurate placement of the medication.   Advised to call if fevers/chills, erythema, induration, drainage, or persistent bleeding.   Images permanently stored and available for review in the ultrasound unit.  Impression: Technically successful ultrasound guided injection.  EXAM: LEFT KNEE 3 VIEWS   COMPARISON:  Frontal view of the bilateral knees 11/03/2020   FINDINGS: Normal bone mineralization. Joint spaces are preserved. Minimal degenerative spurring at the superior aspect of the patella. No joint effusion. No acute fracture or dislocation.   IMPRESSION: No significant osteoarthritis.     Electronically Signed   By: Neita Garnet M.D.   On: 11/22/2021 08:09 I, [name], personally (independently) visualized and performed the interpretation of the images attached in this note.     Assessment and Plan: 47 y.o. male with chronic left knee pain with acute exacerbation.  He had surgery to his right knee and has been put more pressure through his left knee recently.  He is a good candidate for repeat trial of  steroid injection.  If this does not work well enough he should proceed to MRI to further evaluate source of pain and some of his mechanical symptoms.  As for his lumbar radicular symptoms recommend contact Dr. Alvester Morin.  I think a repeat epidural steroid injection is probably reasonable.   PDMP not reviewed this encounter. Orders Placed This Encounter  Procedures   [place]  LIMITED JOINT SPACE STRUCTURES LOW BILAT(NO LINKED CHARGES)    Order Specific Question:   Reason for Exam (SYMPTOM  OR DIAGNOSIS REQUIRED)    Answer:   bilat knee pain    Order Specific Question:   Preferred imaging location?    Answer:   [facility]   No orders of the defined types were placed in this encounter.    Discussed warning signs or symptoms. Please see discharge instructions. Patient expresses understanding.   The above documentation has been reviewed and is accurate and complete [name], M.D.

## 2023-01-31 NOTE — Telephone Encounter (Signed)
Patient was seen today.

## 2023-02-01 ENCOUNTER — Encounter: Payer: Self-pay | Admitting: Orthopedic Surgery

## 2023-02-01 DIAGNOSIS — M5416 Radiculopathy, lumbar region: Secondary | ICD-10-CM

## 2023-02-01 NOTE — Telephone Encounter (Signed)
Hey Autumn, it has been about a month since his surgery so if he just wants to come in for another [facility] joint injection sometime next week with me or Dr. August Saucer that is okay.  You can add him to my schedule any day.

## 2023-02-04 NOTE — Telephone Encounter (Signed)
Once I look at the results I will send him a MyChart message.  Thanks.

## 2023-02-04 NOTE — Telephone Encounter (Signed)
Patient is requesting his CT results.

## 2023-02-08 ENCOUNTER — Encounter: Payer: Self-pay | Admitting: Surgical

## 2023-02-08 ENCOUNTER — Ambulatory Visit: Payer: Medicare HMO | Admitting: Surgical

## 2023-02-08 ENCOUNTER — Other Ambulatory Visit: Payer: Self-pay

## 2023-02-08 ENCOUNTER — Other Ambulatory Visit: Payer: Self-pay | Admitting: Internal Medicine

## 2023-02-08 VITALS — Wt 200.0 lb

## 2023-02-08 DIAGNOSIS — M19011 Primary osteoarthritis, right shoulder: Secondary | ICD-10-CM | POA: Diagnosis not present

## 2023-02-08 MED ORDER — METHYLPREDNISOLONE ACETATE 40 MG/ML IJ SUSP
13.3300 mg | INTRAMUSCULAR | Status: AC | PRN
Start: 2023-02-08 — End: 2023-02-08
  Administered 2023-02-08: 13.33 mg via INTRA_ARTICULAR

## 2023-02-08 MED ORDER — LIDOCAINE HCL 1 % IJ SOLN
3.0000 mL | INTRAMUSCULAR | Status: AC | PRN
Start: 2023-02-08 — End: 2023-02-08
  Administered 2023-02-08: 3 mL

## 2023-02-08 MED ORDER — BUPIVACAINE HCL 0.25 % IJ SOLN
0.6600 mL | INTRAMUSCULAR | Status: AC | PRN
Start: 2023-02-08 — End: 2023-02-08
  Administered 2023-02-08: .66 mL via INTRA_ARTICULAR

## 2023-02-08 NOTE — Progress Notes (Signed)
   Procedure Note  Patient: Adam Vaughn             Date of Birth: 1975-12-02           MRN: [id number]             Visit Date: 02/08/2023  Procedures: Visit Diagnoses:  1. Arthritis of right acromioclavicular joint     Medium Joint Inj: R acromioclavicular on 02/08/2023 1:47 PM Indications: diagnostic evaluation and pain Details: 22 G 1.5 in needle, ultrasound-guided anterior approach Medications: 3 mL lidocaine 1 %; 0.66 mL bupivacaine 0.25 %; 13.33 mg methylPREDNISolone acetate 40 MG/ML Outcome: tolerated well, no immediate complications  Patient returns for second right AC joint injection.  Last AC joint injection lasted several months.  Also here s/p right knee arthroscopy with meniscal debridement on 12/31/2022.  His knee is doing well and he is satisfied with how it is doing.  He has range of motion from 0 degrees extension to about 115 degrees of knee flexion.  Incisions are healing well with a little bit of eschar noted over the lateral portal incision but the erythema that was previously described has resolved.  There is no trace knee effusion.  No calf tenderness.  Negative Homans' sign.  Able to perform straight leg raise without extensor lag.  Plan is to follow-up as needed.  Left knee feeling better after Dr. Gracelyn Nurse administered left knee injection about a week ago. Procedure, treatment alternatives, risks and benefits explained, specific risks discussed. Consent was given by the patient. Immediately prior to procedure a time out was called to verify the correct patient, procedure, equipment, support staff and site/side marked as required. Patient was prepped and draped in the usual sterile fashion.

## 2023-02-11 ENCOUNTER — Encounter: Payer: Self-pay | Admitting: Internal Medicine

## 2023-02-11 ENCOUNTER — Ambulatory Visit (INDEPENDENT_AMBULATORY_CARE_PROVIDER_SITE_OTHER): Payer: Medicare HMO | Admitting: Internal Medicine

## 2023-02-11 VITALS — BP 118/70 | HR 72 | Temp 98.6°F | Ht 68.0 in | Wt 195.0 lb

## 2023-02-11 DIAGNOSIS — E291 Testicular hypofunction: Secondary | ICD-10-CM | POA: Diagnosis not present

## 2023-02-11 DIAGNOSIS — N200 Calculus of kidney: Secondary | ICD-10-CM

## 2023-02-11 DIAGNOSIS — R7989 Other specified abnormal findings of blood chemistry: Secondary | ICD-10-CM | POA: Diagnosis not present

## 2023-02-11 MED ORDER — OXYCODONE HCL 10 MG PO TABS: 10.0000 mg | ORAL_TABLET | Freq: Three times a day (TID) | ORAL | 0 refills | Status: DC | PRN

## 2023-02-11 MED ORDER — BD DISP NEEDLE 25G X 1" MISC
3 refills | Status: AC
Start: 2023-02-11 — End: ?

## 2023-02-11 MED ORDER — ONDANSETRON HCL 4 MG PO TABS: 4.0000 mg | ORAL_TABLET | Freq: Three times a day (TID) | ORAL | 3 refills | Status: DC | PRN

## 2023-02-11 MED ORDER — TESTOSTERONE CYPIONATE 200 MG/ML IM SOLN
INTRAMUSCULAR | 1 refills | Status: DC
Start: 2023-02-11 — End: 2023-04-15

## 2023-02-11 NOTE — Progress Notes (Addendum)
Subjective:  Patient ID: Adam Vaughn, male    DOB: [birth year]  Age: 47 y.o. MRN: [id number]  CC: Follow-up (2 mnth f/u)   HPI Adam Vaughn presents for chronic LBP, s/p knee surgery, kidney stones  Outpatient Medications Prior to Visit  Medication Sig Dispense Refill   Docusate Calcium (STOOL SOFTENER PO) Take 1 tablet by mouth daily.     esomeprazole (NEXIUM) 40 MG capsule Take 1 capsule (40 mg total) by mouth 2 (two) times daily before a meal. 60 capsule 11   ezetimibe (ZETIA) 10 MG tablet Take 1 tablet (10 mg total) by mouth daily. 90 tablet 3   fluticasone (FLONASE) 50 MCG/ACT nasal spray Place 2 sprays into both nostrils daily. 16 g 6   hyoscyamine (LEVSIN SL) 0.125 MG SL tablet Take 1 tablet (0.125 mg total) by mouth every 6 (six) hours as needed. 30 tablet 2   methylcellulose (CITRUCEL) oral powder Take 1 packet by mouth daily.     metoprolol tartrate (LOPRESSOR) 100 MG tablet TAKE 2 HOURS PRIOR TO CT SCAN 1 tablet 0   NEEDLE, DISP, 23 G (BD DISP NEEDLE) 23G X 1" MISC Use for intramuscular injections. 50 each 3   Plecanatide (TRULANCE) 3 MG TABS Take 1 tablet (3 mg total) by mouth daily. For constipation 30 tablet 11   pregabalin (LYRICA) 150 MG capsule Take 1 capsule (150 mg total) by mouth 2 (two) times daily. 60 capsule 5   RELISTOR 150 MG TABS Take 1 tablet by mouth daily.     tamsulosin (FLOMAX) 0.4 MG CAPS capsule Take 1 capsule (0.4 mg total) by mouth daily. 90 capsule 3   tiZANidine (ZANAFLEX) 4 MG tablet TAKE 1 TABLET BY MOUTH THREE TIMES DAILY 180 tablet 0   Vitamin D-Vitamin K (K2 PLUS D3) [phone] MCG-UNIT TABS 1 po daily 100 tablet 3   doxycycline (VIBRA-TABS) 100 MG tablet Take 1 tablet (100 mg total) by mouth 2 (two) times daily. 14 tablet 0   Lactulose 20 GM/30ML SOLN Take 30-60 mLs (20-40 g total) by mouth 2 (two) times daily as needed (severe constipation). 1000 mL 3   naproxen (NAPROSYN) 500 MG tablet Take 1 tablet (500 mg total) by mouth 2 (two)  times daily as needed for moderate pain. 180 tablet 0   ondansetron (ZOFRAN) 4 MG tablet TAKE 1 TABLET BY MOUTH EVERY 8 HOURS AS NEEDED FOR NAUSEA OR VOMITING 20 tablet 0   Oxycodone HCl 10 MG TABS Take 1 tablet (10 mg total) by mouth 3 (three) times daily as needed. 90 tablet 0   Oxycodone HCl 10 MG TABS Take 1 tablet (10 mg total) by mouth 3 (three) times daily as needed. 90 tablet 0   rosuvastatin (CRESTOR) 40 MG tablet Take 1 tablet (40 mg total) by mouth daily. 90 tablet 3   testosterone cypionate (DEPOTESTOSTERONE CYPIONATE) 200 MG/ML injection Use 200 mg IM every 10 days 10 mL 5   albuterol (PROAIR HFA) 108 (90 Base) MCG/ACT inhaler Inhale 2 puffs into the lungs every 6 (six) hours as needed for wheezing or shortness of breath. 3 each 1   methocarbamol (ROBAXIN) 500 MG tablet Take 1 tablet (500 mg total) by mouth every 8 (eight) hours as needed for muscle spasms. (Patient not taking: Reported on 01/31/2023) 30 tablet 1   No facility-administered medications prior to visit.    ROS: Review of Systems  Constitutional:  Negative for appetite change, fatigue and unexpected weight change.  HENT:  Negative for  congestion, nosebleeds, sneezing, sore throat and trouble swallowing.   Eyes:  Negative for itching and visual disturbance.  Respiratory:  Negative for cough.   Cardiovascular:  Negative for chest pain, palpitations and leg swelling.  Gastrointestinal:  Negative for abdominal distention, blood in stool, diarrhea and nausea.  Genitourinary:  Positive for flank pain and hematuria. Negative for frequency and testicular pain.  Musculoskeletal:  Positive for arthralgias, back pain and gait problem. Negative for joint swelling and neck pain.  Skin:  Negative for rash.  Neurological:  Negative for dizziness, tremors, speech difficulty and weakness.  Psychiatric/Behavioral:  Negative for agitation, dysphoric mood and sleep disturbance. The patient is not nervous/anxious.     Objective:  BP  118/70 (BP Location: Left Arm, Patient Position: Sitting, Cuff Size: Large)   Pulse 72   Temp 98.6 F (37 C) (Oral)   Ht 5\' 8"  (1.727 m)   Wt 195 lb (88.5 kg)   SpO2 98%   BMI 29.65 kg/m   BP Readings from Last 3 Encounters:  02/11/23 118/70  01/31/23 (!) 144/92  01/08/23 122/74    Wt Readings from Last 3 Encounters:  02/11/23 195 lb (88.5 kg)  02/08/23 200 lb (90.7 kg)  01/31/23 200 lb (90.7 kg)    Physical Exam Constitutional:      General: He is not in acute distress.    Appearance: He is well-developed.     Comments: NAD  Eyes:     Conjunctiva/sclera: Conjunctivae normal.     Pupils: Pupils are equal, round, and reactive to light.  Neck:     Thyroid: No thyromegaly.     Vascular: No JVD.  Cardiovascular:     Rate and Rhythm: Normal rate and regular rhythm.     Heart sounds: Normal heart sounds. No murmur heard.    No friction rub. No gallop.  Pulmonary:     Effort: Pulmonary effort is normal. No respiratory distress.     Breath sounds: Normal breath sounds. No wheezing or rales.  Chest:     Chest wall: No tenderness.  Abdominal:     General: Bowel sounds are normal. There is no distension.     Palpations: Abdomen is soft. There is no mass.     Tenderness: There is no abdominal tenderness. There is no guarding or rebound.  Musculoskeletal:        General: Tenderness present. Normal range of motion.     Cervical back: Normal range of motion.  Lymphadenopathy:     Cervical: No cervical adenopathy.  Skin:    General: Skin is warm and dry.     Findings: No rash.  Neurological:     Mental Status: He is alert and oriented to person, place, and time.     Cranial Nerves: No cranial nerve deficit.     Motor: No abnormal muscle tone.     Coordination: Coordination normal.     Gait: Gait abnormal.     Deep Tendon Reflexes: Reflexes are normal and symmetric.  Psychiatric:        Behavior: Behavior normal.        Thought Content: Thought content normal.         Judgment: Judgment normal.    LS w/pain Antalgic gait   Lab Results  Component Value Date   WBC 6.8 01/08/2023   HGB 16.0 01/08/2023   HCT 48.0 01/08/2023   PLT 283.0 01/08/2023   GLUCOSE 104 (H) 01/08/2023   CHOL 164 01/11/2023   TRIG 141 01/11/2023  HDL 48 01/11/2023   LDLCALC 91 01/11/2023   ALT 54 (H) 01/11/2023   AST 28 01/11/2023   NA 137 01/08/2023   K 4.1 01/08/2023   CL 103 01/08/2023   CREATININE 0.94 01/08/2023   BUN 12 01/08/2023   CO2 29 01/08/2023   TSH 1.27 09/11/2022   PSA 1.83 09/04/2021   HGBA1C 5.5 09/04/2021    CT RENAL STONE STUDY  Result Date: 02/02/2023 CLINICAL DATA:  Bilateral flank pain.  Hematuria.  Nephrolithiasis. EXAM: CT ABDOMEN AND PELVIS WITHOUT CONTRAST TECHNIQUE: Multidetector CT imaging of the abdomen and pelvis was performed following the standard protocol without IV contrast. RADIATION DOSE REDUCTION: This exam was performed according to the departmental dose-optimization program which includes automated exposure control, adjustment of the mA and/or kV according to patient size and/or use of iterative reconstruction technique. COMPARISON:  11/19/2022 FINDINGS: Lower chest: No acute findings. Hepatobiliary: No mass visualized on this unenhanced exam. Prior cholecystectomy. No evidence of biliary obstruction. Pancreas: No mass or inflammatory process visualized on this unenhanced exam. Spleen:  Within normal limits in size. Adrenals/Urinary tract: Multiple small renal calculi are again seen bilaterally, largest measuring 7 mm in the left kidney. No evidence of ureteral calculi or hydronephrosis. No bladder calculi identified. Stomach/Bowel: No evidence of obstruction, inflammatory process, or abnormal fluid collections. Normal appendix visualized. Vascular/Lymphatic: No pathologically enlarged lymph nodes identified. No evidence of abdominal aortic aneurysm. Reproductive:  No mass or other significant abnormality. Other:  None. Musculoskeletal:  No  suspicious bone lesions identified. IMPRESSION: Bilateral nephrolithiasis. No evidence of ureteral calculi, hydronephrosis, or other acute findings. Electronically Signed   By: Danae Orleans M.D.   On: 02/02/2023 16:09    Assessment & Plan:   Problem List Items Addressed This Visit     Nephrolithiasis    01/2023 Recent CT: Urinary tract: Multiple small renal calculi are again seen bilaterally, largest measuring 7 mm in the left kidney. No evidence of ureteral calculi or hydronephrosis. No bladder calculi identified.  Urology ref      Relevant Medications   Oxycodone HCl 10 MG TABS   Oxycodone HCl 10 MG TABS   Other Relevant Orders   Ambulatory referral to Urology   Low vitamin B12 level    On B complex      Hypogonadism male - Primary   Relevant Medications   testosterone cypionate (DEPOTESTOSTERONE CYPIONATE) 200 MG/ML injection   NEEDLE, DISP, 25 G (B-D DISP NEEDLE 25GX1") 25G X 1" MISC      Meds ordered this encounter  Medications   Oxycodone HCl 10 MG TABS    Sig: Take 1 tablet (10 mg total) by mouth 3 (three) times daily as needed.    Dispense:  90 tablet    Refill:  0    Please fill on or after 03/11/23   testosterone cypionate (DEPOTESTOSTERONE CYPIONATE) 200 MG/ML injection    Sig: Use 200 mg IM every 10 days    Dispense:  10 mL    Refill:  1   NEEDLE, DISP, 25 G (B-D DISP NEEDLE 25GX1") 25G X 1" MISC    Sig: Use every two weeks to inject Testosterone medication.    Dispense:  50 each    Refill:  3   Oxycodone HCl 10 MG TABS    Sig: Take 1 tablet (10 mg total) by mouth 3 (three) times daily as needed.    Dispense:  90 tablet    Refill:  0    Please fill on or  after 02/09/23   ondansetron (ZOFRAN) 4 MG tablet    Sig: Take 1 tablet (4 mg total) by mouth every 8 (eight) hours as needed.    Dispense:  30 tablet    Refill:  3      Follow-up: No follow-ups on file.  Sonda Primes, MD

## 2023-02-11 NOTE — Assessment & Plan Note (Signed)
On B complex 

## 2023-02-11 NOTE — Assessment & Plan Note (Signed)
01/2023 Recent CT: Urinary tract: Multiple small renal calculi are again seen bilaterally, largest measuring 7 mm in the left kidney. No evidence of ureteral calculi or hydronephrosis. No bladder calculi identified.  Urology ref

## 2023-02-12 ENCOUNTER — Telehealth: Payer: Medicare HMO | Admitting: Nurse Practitioner

## 2023-02-12 DIAGNOSIS — J069 Acute upper respiratory infection, unspecified: Secondary | ICD-10-CM | POA: Diagnosis not present

## 2023-02-12 DIAGNOSIS — U071 COVID-19: Secondary | ICD-10-CM

## 2023-02-12 NOTE — Progress Notes (Signed)
Virtual Visit Consent   Adam Vaughn, you are scheduled for a virtual visit with a [facility] provider today. Just as with appointments in the office, your consent must be obtained to participate. Your consent will be active for this visit and any virtual visit you may have with one of our providers in the next 365 days. If you have a MyChart account, a copy of this consent can be sent to you electronically.  As this is a virtual visit, video technology does not allow for your provider to perform a traditional examination. This may limit your provider's ability to fully assess your condition. If your provider identifies any concerns that need to be evaluated in person or the need to arrange testing (such as labs, EKG, etc.), we will make arrangements to do so. Although advances in technology are sophisticated, we cannot ensure that it will always work on either your end or our end. If the connection with a video visit is poor, the visit may have to be switched to a telephone visit. With either a video or telephone visit, we are not always able to ensure that we have a secure connection.  By engaging in this virtual visit, you consent to the provision of healthcare and authorize for your insurance to be billed (if applicable) for the services provided during this visit. Depending on your insurance coverage, you may receive a charge related to this service.  I need to obtain your verbal consent now. Are you willing to proceed with your visit today? [name] has provided verbal consent on 02/12/2023 for a virtual visit (video or telephone). Adam Simas, FNP  Date: 02/12/2023 12:38 PM  Virtual Visit via Video Note   I, Adam Vaughn, connected with  DMARION Vaughn  ([id number], 29-Jul-1975) on 02/12/23 at 12:45 PM EDT by a video-enabled telemedicine application and verified that I am speaking with the correct person using two identifiers.  Location: Patient: Virtual Visit Location  Patient: Other: work  Provider: Pharmacist, community: Home Office   I discussed the limitations of evaluation and management by telemedicine and the availability of in person appointments. The patient expressed understanding and agreed to proceed.    History of Present Illness: Adam Vaughn is a 47 y.o. who identifies as a male who was assigned male at birth, and is being seen today for complaints of sore throat, muscle aches and eye irritation.   Also has fatigue today   Symptom onset was yesterday after lunch time   Feels warm but denies a fever  Denies any contact with flu/COVID   He does have COVID tests at home but has not taken one (he is currently at work)    Problems:  Patient Active Problem List   Diagnosis Date Noted   Hematuria 01/08/2023   History of kidney stones 01/08/2023   Hypokalemia 12/10/2022   Foot pain, bilateral 11/12/2022   Hypogonadism male 09/19/2022   Chronic eczematous otitis externa of both ears 05/16/2022   Hypertrophy of inferior nasal turbinate 05/16/2022   Situational depression 05/14/2022   Coronary atherosclerosis 03/26/2022   Dyslipidemia 01/11/2022   Chronic pain syndrome 11/07/2021   Piriformis syndrome 11/07/2021   Bladder neck obstruction 09/04/2021   Asthmatic bronchitis 09/04/2021   Pain in right shoulder 06/29/2021   Meteorism 06/01/2021   Sinus congestion 02/28/2021   Vertigo 02/17/2021   Headache 02/17/2021   Fever 02/17/2021   Falls 02/17/2021   Allergic rhinitis 10/25/2020   Family history of  acute polio 10/25/2020   Chest pain, atypical 10/10/2020   S/P laparoscopic cholecystectomy 10/10/2020   Hyperglycemia 10/10/2020   Acute cholecystitis 09/30/2020   Insomnia 09/26/2020   Knee pain 06/23/2020   Low vitamin B12 level 05/31/2020   Ankle sprain 05/31/2020   Constipation 05/31/2020   Degenerative spondylolisthesis 04/11/2020   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    DDD (degenerative disc disease), lumbar 01/20/2020   Left lumbar radiculitis 01/20/2020   Vitamin D deficiency 01/15/2020   Low back pain 01/12/2020   IBS (irritable bowel syndrome) 01/12/2020   Nephrolithiasis 01/12/2020   Erectile dysfunction 01/12/2020   GERD (gastroesophageal reflux disease) 01/12/2020    Allergies:  Allergies  Allergen Reactions   Cymbalta [Duloxetine Hcl]     Nausea   Gabapentin     Twitching muscles   Medications:  Current Outpatient Medications:    albuterol (PROAIR HFA) 108 (90 Base) MCG/ACT inhaler, Inhale 2 puffs into the lungs every 6 (six) hours as needed for wheezing or shortness of breath., Disp: 3 each, Rfl: 1   Docusate Calcium (STOOL SOFTENER PO), Take 1 tablet by mouth daily., Disp: , Rfl:    esomeprazole (NEXIUM) 40 MG capsule, Take 1 capsule (40 mg total) by mouth 2 (two) times daily before a meal., Disp: 60 capsule, Rfl: 11   ezetimibe (ZETIA) 10 MG tablet, Take 1 tablet (10 mg total) by mouth daily., Disp: 90 tablet, Rfl: 3   fluticasone (FLONASE) 50 MCG/ACT nasal spray, Place 2 sprays into both nostrils daily., Disp: 16 g, Rfl: 6   hyoscyamine (LEVSIN SL) 0.125 MG SL tablet, Take 1 tablet (0.125 mg total) by mouth every 6 (six) hours as needed., Disp: 30 tablet, Rfl: 2   methylcellulose (CITRUCEL) oral powder, Take 1 packet by mouth daily., Disp: , Rfl:    metoprolol tartrate (LOPRESSOR) 100 MG tablet, TAKE 2 HOURS PRIOR TO CT SCAN, Disp: 1 tablet, Rfl: 0   NEEDLE, DISP, 23 G (BD DISP NEEDLE) 23G X 1" MISC, Use for intramuscular injections., Disp: 50 each, Rfl: 3   NEEDLE, DISP, 25 G (B-D DISP NEEDLE 25GX1") 25G X 1" MISC, Use every two weeks to inject Testosterone medication., Disp: 50 each, Rfl: 3   ondansetron (ZOFRAN) 4 MG tablet, Take 1 tablet (4 mg total) by mouth every 8 (eight) hours as needed., Disp: 30 tablet, Rfl: 3   Oxycodone HCl 10 MG TABS, Take 1 tablet (10 mg total) by mouth 3 (three) times daily as needed., Disp: 90 tablet, Rfl:  0   Oxycodone HCl 10 MG TABS, Take 1 tablet (10 mg total) by mouth 3 (three) times daily as needed., Disp: 90 tablet, Rfl: 0   Plecanatide (TRULANCE) 3 MG TABS, Take 1 tablet (3 mg total) by mouth daily. For constipation, Disp: 30 tablet, Rfl: 11   pregabalin (LYRICA) 150 MG capsule, Take 1 capsule (150 mg total) by mouth 2 (two) times daily., Disp: 60 capsule, Rfl: 5   RELISTOR 150 MG TABS, Take 1 tablet by mouth daily., Disp: , Rfl:    tamsulosin (FLOMAX) 0.4 MG CAPS capsule, Take 1 capsule (0.4 mg total) by mouth daily., Disp: 90 capsule, Rfl: 3   testosterone cypionate (DEPOTESTOSTERONE CYPIONATE) 200 MG/ML injection, Use 200 mg IM every 10 days, Disp: 10 mL, Rfl: 1   tiZANidine (ZANAFLEX) 4 MG tablet, TAKE 1 TABLET BY MOUTH THREE TIMES DAILY, Disp: 180 tablet, Rfl: 0   Vitamin D-Vitamin K (K2 PLUS D3) [phone] MCG-UNIT TABS, 1 po  daily, Disp: 100 tablet, Rfl: 3  Observations/Objective: Patient is well-developed, well-nourished in no acute distress.  Resting comfortably  at home.  Head is normocephalic, atraumatic.  No labored breathing.  Speech is clear and coherent with logical content.  Patient is alert and oriented at baseline.    Assessment and Plan: 1. Viral URI Advised COVID testing Patient may choose to call back and discuss anti-viral management if he tests positive for COVID   Otherwise discussed managing viral symptoms with over the counter medications.   Push fluids, rest, warm salt water gargles for sore throat or lozenges  No congestion at this time  May use Artificial tears to calm eyes  Ibuprofen/tylenol for body aches       Follow Up Instructions: I discussed the assessment and treatment plan with the patient. The patient was provided an opportunity to ask questions and all were answered. The patient agreed with the plan and demonstrated an understanding of the instructions.  A copy of instructions were sent to the patient via MyChart unless otherwise noted  below.    The patient was advised to call back or seek an in-person evaluation if the symptoms worsen or if the condition fails to improve as anticipated.  Time:  I spent 10 minutes with the patient via telehealth technology discussing the above problems/concerns.    Adam Simas, FNP

## 2023-02-13 ENCOUNTER — Encounter: Payer: Self-pay | Admitting: Physician Assistant

## 2023-02-13 MED ORDER — NIRMATRELVIR/RITONAVIR (PAXLOVID)TABLET
3.0000 | ORAL_TABLET | Freq: Two times a day (BID) | ORAL | 0 refills | Status: AC
Start: 2023-02-13 — End: 2023-02-18

## 2023-02-13 NOTE — Addendum Note (Signed)
Addended by: Margaretann Loveless on: 02/13/2023 08:46 AM   Modules accepted: Orders

## 2023-02-18 ENCOUNTER — Encounter: Payer: Medicare HMO | Admitting: Orthopedic Surgery

## 2023-02-21 ENCOUNTER — Telehealth: Payer: Self-pay | Admitting: Internal Medicine

## 2023-03-07 ENCOUNTER — Encounter: Payer: Self-pay | Admitting: Podiatry

## 2023-03-07 ENCOUNTER — Ambulatory Visit: Payer: Medicare HMO | Admitting: Podiatry

## 2023-03-07 DIAGNOSIS — M7752 Other enthesopathy of left foot: Secondary | ICD-10-CM

## 2023-03-07 DIAGNOSIS — M722 Plantar fascial fibromatosis: Secondary | ICD-10-CM

## 2023-03-07 DIAGNOSIS — M7671 Peroneal tendinitis, right leg: Secondary | ICD-10-CM | POA: Diagnosis not present

## 2023-03-07 MED ORDER — TRIAMCINOLONE ACETONIDE 10 MG/ML IJ SUSP
10.0000 mg | Freq: Once | INTRAMUSCULAR | Status: AC
Start: 2023-03-07 — End: 2023-03-07
  Administered 2023-03-07: 10 mg via INTRA_ARTICULAR

## 2023-03-08 NOTE — Progress Notes (Signed)
Subjective:   Patient ID: Adam Vaughn, male   DOB: 47 y.o.   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  For the left I did do sterile prep and injected the first MPJ periarticular 3 mg dexamethasone Kenalog 5 mg Xylocaine and for the right discussed risk and went ahead and injected the sheath 3 mg dexamethasone Kenalog 5 mg Xylocaine.  Patient will be seen back as needed ultimately surgery will probably be necessary

## 2023-03-11 ENCOUNTER — Other Ambulatory Visit (INDEPENDENT_AMBULATORY_CARE_PROVIDER_SITE_OTHER): Payer: Medicare HMO

## 2023-03-11 ENCOUNTER — Ambulatory Visit (INDEPENDENT_AMBULATORY_CARE_PROVIDER_SITE_OTHER): Payer: Medicare HMO | Admitting: Orthopedic Surgery

## 2023-03-11 DIAGNOSIS — M542 Cervicalgia: Secondary | ICD-10-CM

## 2023-03-11 MED ORDER — PREDNISONE 5 MG (21) PO TBPK: ORAL_TABLET | ORAL | 0 refills | Status: DC

## 2023-03-15 ENCOUNTER — Encounter: Payer: Self-pay | Admitting: Orthopedic Surgery

## 2023-03-15 NOTE — Progress Notes (Signed)
Office Visit Note   Patient: Adam Vaughn           Date of Birth: 1976-01-25           MRN: [id number] Visit Date: 03/11/2023 Requested by: Tresa Garter, MD [address] [place],  [place] [address] PCP: Tresa Garter, MD  Subjective: Chief Complaint  Patient presents with   Right Knee - Routine Post Op    RIGHT KNEE SCOPE (surgery date 12-31-22)    HPI: Adam Vaughn is a 47 y.o. male who presents to the office reporting mild right knee pain following knee arthroscopy 12/31/2022.  Still has some stiffness.  Overall he is getting better.  He did have a right AC joint injection on 02/08/2023 which gave him about 50% relief.  When he gets up out of a chair his knee has some soreness.  Hard for him to actually weight-bear on the knee.  Overall however he states that is improving.  He also describes left arm and neck pain along with forearm burning with no history of neck surgery.  This has been going on for several weeks.  Describes that neck range of motion is painful.  When he moves his chin to chest he describes pain going down his legs..                ROS: All systems reviewed are negative as they relate to the chief complaint within the history of present illness.  Patient denies fevers or chills.  Assessment & Plan: Visit Diagnoses:  1. Neck pain     Plan: Impression is expected recovery from right knee arthroscopy.  Continue with nonweightbearing quad strengthening exercises.  Right shoulder AC joint did give him decent relief.  This is something he can live with for now.  He is having what sounds like left-sided radiculopathy as well.  2 views cervical spine show minimal to no degenerative changes.  6-day Medrol Dosepak prescribed.  MRI scan will be the next step if the Medrol Dosepak does not help.  Follow-Up Instructions: No follow-ups on file.   Orders:  Orders Placed This Encounter  Procedures   XR Cervical Spine 2 or 3 views   Meds ordered this  encounter  Medications   predniSONE (STERAPRED UNI-PAK 21 TAB) 5 MG (21) TBPK tablet    Sig: Take dosepak as directed    Dispense:  21 tablet    Refill:  0      Procedures: No procedures performed   Clinical Data: No additional findings.  Objective: Vital Signs: There were no vitals taken for this visit.  Physical Exam:  Constitutional: Patient appears well-developed HEENT:  Head: Normocephalic Eyes:EOM are normal Neck: Normal range of motion Cardiovascular: Normal rate Pulmonary/chest: Effort normal Neurologic: Patient is alert Skin: Skin is warm Psychiatric: Patient has normal mood and affect  Ortho Exam: Ortho exam demonstrates good shoulder range of motion on the right with no AC joint tenderness.  Rotator cuff strength is intact and there is no Popeye deformity.  Right knee demonstrates excellent range of motion with no effusion.  Quad strength improving with about 1 cm atrophy right versus left.  Patient has pain with flexion chin to chest and extension lacks about 10 to 20 degrees of normal.  Rotation is 60 degrees bilaterally.  Patient has 5 out of 5 grip EPL FPL interosseous wrist flexion extension biceps triceps and deltoid strength.  No masses lymphadenopathy or skin changes noted in the neck region.  No definite paresthesias C5-T1.  Reflexes symmetric 0 to 1+ out of 4 bilateral biceps triceps and patella.  Specialty Comments:  EXAM: MRI LUMBAR SPINE WITHOUT CONTRAST   TECHNIQUE: Multiplanar, multisequence MR imaging of the lumbar spine was performed. No intravenous contrast was administered.   COMPARISON:  01/18/2021   FINDINGS: Segmentation:  Standard.   Alignment:  2 mm retrolisthesis of L3 on L4.   Vertebrae: No acute fracture, evidence of discitis, or aggressive bone lesion. 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   Conus medullaris and cauda equina: Conus extends to the T12-L1 level. Conus and cauda equina appear normal.    Paraspinal and other soft tissues: No acute paraspinal abnormality. Postsurgical changes in the posterior paraspinal soft tissues at L4-S1.   Disc levels:   Disc spaces: Posterior lumbar interbody fusion from L4 through S1.   T12-L1: No significant disc bulge. No neural foraminal stenosis. No central canal stenosis.   L1-L2: No significant disc bulge. No neural foraminal stenosis. No central canal stenosis.   L2-L3: Minimal broad-based disc bulge. No left foraminal stenosis. Mild right foraminal stenosis. No spinal stenosis.   L3-L4: Broad-based disc bulge flattening ventral thecal sac. Mild bilateral facet arthropathy. Mild bilateral foraminal stenosis. Mild spinal stenosis. Bilateral lateral recess stenosis.   L4-L5: Interbody fusion.  No foraminal or central canal stenosis.   L5-S1: Interbody fusion.  No foraminal or central canal stenosis.   IMPRESSION: 1. At L3-4 there is a broad-based disc bulge flattening ventral thecal sac. Mild bilateral facet arthropathy. Mild bilateral foraminal stenosis. Mild spinal stenosis. Bilateral lateral recess stenosis. 2. Posterior lumbar interbody fusion from L4 through S1 without foraminal or central canal stenosis. 3. No acute osseous injury of the lumbar spine.     Electronically Signed   By: Elige Ko M.D.   On: 05/22/2022 10:20  Imaging: No results found.   PMFS History: Patient Active Problem List   Diagnosis Date Noted   Hematuria 01/08/2023   History of kidney stones 01/08/2023   Hypokalemia 12/10/2022   Foot pain, bilateral 11/12/2022   Hypogonadism male 09/19/2022   Chronic eczematous otitis externa of both ears 05/16/2022   Hypertrophy of inferior nasal turbinate 05/16/2022   Situational depression 05/14/2022   Coronary atherosclerosis 03/26/2022   Dyslipidemia 01/11/2022   Chronic pain syndrome 11/07/2021   Piriformis syndrome 11/07/2021   Bladder neck obstruction 09/04/2021   Asthmatic bronchitis  09/04/2021   Pain in right shoulder 06/29/2021   Meteorism 06/01/2021   Sinus congestion 02/28/2021   Vertigo 02/17/2021   Headache 02/17/2021   Fever 02/17/2021   Falls 02/17/2021   Allergic rhinitis 10/25/2020   Family history of acute polio 10/25/2020   Chest pain, atypical 10/10/2020   S/P laparoscopic cholecystectomy 10/10/2020   Hyperglycemia 10/10/2020   Acute cholecystitis 09/30/2020   Insomnia 09/26/2020   Knee pain 06/23/2020   Low vitamin B12 level 05/31/2020   Ankle sprain 05/31/2020   Constipation 05/31/2020   Degenerative spondylolisthesis 04/11/2020   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   DDD (degenerative disc disease), lumbar 01/20/2020   Left lumbar radiculitis 01/20/2020   Vitamin D deficiency 01/15/2020   Low back pain 01/12/2020   IBS (irritable bowel syndrome) 01/12/2020   Nephrolithiasis 01/12/2020   Erectile dysfunction 01/12/2020   GERD (gastroesophageal reflux disease) 01/12/2020   Past Medical History:  Diagnosis Date   Arthritis    Chronic back pain    Family history of colon cancer  sister   GERD (gastroesophageal reflux disease)    History of kidney stones    Hyperlipidemia    Hypertension     Family History  Problem Relation Age of Onset   Heart disease Father 18       MI   Hypertension Father    Heart attack Father    Other Father        Small intestine removed but does not know why   Diabetes Sister    Heart disease Sister 30       MI   Colon cancer Sister    Colon polyps Sister    Colon polyps Sister    Healthy Sister    Healthy Sister    Healthy Sister    Other Brother        MVA   Healthy Brother    Healthy Brother    Healthy Brother    Healthy Brother    Healthy Brother    Healthy Brother    Liver disease Neg Hx    Pancreatic cancer Neg Hx    Esophageal cancer Neg Hx    Stomach cancer Neg Hx     Past Surgical History:  Procedure Laterality Date   BACK SURGERY   04/11/2020   CHOLECYSTECTOMY N/A 10/01/2020   Procedure: LAPAROSCOPIC CHOLECYSTECTOMY;  Surgeon: Griselda Miner, MD;  Location: [facility];  Service: General;  Laterality: N/A;   COLONOSCOPY  1991   IBS   endocolon  09/15/2021   [facility], GERD   HAND SURGERY Bilateral    SHOULDER SURGERY Right 08/2021   SPINAL CORD STIMULATOR IMPLANT  2022   SPINAL CORD STIMULATOR REMOVAL  2022   TONSILLECTOMY     VASECTOMY     Social History   Occupational History   Not on file  Tobacco Use   Smoking status: Never   Smokeless tobacco: Never  Vaping Use   Vaping status: Never Used  Substance and Sexual Activity   Alcohol use: Not Currently    Comment: States quit around age 75   Drug use: Never   Sexual activity: Not Currently    Birth control/protection: Surgical    Comment: vastectomy

## 2023-03-21 ENCOUNTER — Encounter: Payer: Self-pay | Admitting: Orthopedic Surgery

## 2023-03-21 DIAGNOSIS — R3912 Poor urinary stream: Secondary | ICD-10-CM | POA: Diagnosis not present

## 2023-03-21 DIAGNOSIS — N200 Calculus of kidney: Secondary | ICD-10-CM | POA: Diagnosis not present

## 2023-03-21 DIAGNOSIS — M5416 Radiculopathy, lumbar region: Secondary | ICD-10-CM

## 2023-03-21 DIAGNOSIS — R3916 Straining to void: Secondary | ICD-10-CM | POA: Diagnosis not present

## 2023-03-21 DIAGNOSIS — R351 Nocturia: Secondary | ICD-10-CM | POA: Diagnosis not present

## 2023-04-05 ENCOUNTER — Encounter: Payer: Self-pay | Admitting: Orthopedic Surgery

## 2023-04-05 DIAGNOSIS — M542 Cervicalgia: Secondary | ICD-10-CM

## 2023-04-05 NOTE — Telephone Encounter (Signed)
Can we order MRI c-spine?

## 2023-04-05 NOTE — Telephone Encounter (Signed)
Mri ordered 

## 2023-04-08 ENCOUNTER — Encounter: Payer: Self-pay | Admitting: Surgical

## 2023-04-09 ENCOUNTER — Encounter: Payer: Self-pay | Admitting: Surgical

## 2023-04-12 ENCOUNTER — Other Ambulatory Visit: Payer: Medicare HMO

## 2023-04-14 ENCOUNTER — Encounter: Payer: Self-pay | Admitting: Family Medicine

## 2023-04-15 ENCOUNTER — Ambulatory Visit (INDEPENDENT_AMBULATORY_CARE_PROVIDER_SITE_OTHER): Payer: Medicare HMO | Admitting: Internal Medicine

## 2023-04-15 ENCOUNTER — Other Ambulatory Visit: Payer: Self-pay

## 2023-04-15 ENCOUNTER — Encounter: Payer: Self-pay | Admitting: Internal Medicine

## 2023-04-15 ENCOUNTER — Ambulatory Visit: Payer: Medicare HMO | Admitting: Physical Medicine and Rehabilitation

## 2023-04-15 VITALS — BP 133/84 | HR 97

## 2023-04-15 VITALS — Ht 68.0 in

## 2023-04-15 DIAGNOSIS — G8929 Other chronic pain: Secondary | ICD-10-CM | POA: Diagnosis not present

## 2023-04-15 DIAGNOSIS — M545 Low back pain, unspecified: Secondary | ICD-10-CM

## 2023-04-15 DIAGNOSIS — M5416 Radiculopathy, lumbar region: Secondary | ICD-10-CM

## 2023-04-15 DIAGNOSIS — M48062 Spinal stenosis, lumbar region with neurogenic claudication: Secondary | ICD-10-CM | POA: Diagnosis not present

## 2023-04-15 DIAGNOSIS — R7989 Other specified abnormal findings of blood chemistry: Secondary | ICD-10-CM

## 2023-04-15 DIAGNOSIS — E291 Testicular hypofunction: Secondary | ICD-10-CM | POA: Diagnosis not present

## 2023-04-15 MED ORDER — TIZANIDINE HCL 4 MG PO TABS: 4.0000 mg | ORAL_TABLET | Freq: Three times a day (TID) | ORAL | 0 refills | Status: DC

## 2023-04-15 MED ORDER — OXYCODONE HCL 10 MG PO TABS: 10.0000 mg | ORAL_TABLET | Freq: Three times a day (TID) | ORAL | 0 refills | Status: DC | PRN

## 2023-04-15 MED ORDER — METHYLPREDNISOLONE ACETATE 40 MG/ML IJ SUSP
40.0000 mg | Freq: Once | INTRAMUSCULAR | Status: AC
Start: 2023-04-15 — End: 2023-04-15
  Administered 2023-04-15: 40 mg

## 2023-04-15 MED ORDER — TESTOSTERONE CYPIONATE 200 MG/ML IM SOLN: INTRAMUSCULAR | 1 refills | Status: DC

## 2023-04-15 MED ORDER — ONDANSETRON HCL 4 MG PO TABS: 4.0000 mg | ORAL_TABLET | Freq: Three times a day (TID) | ORAL | 3 refills | Status: DC | PRN

## 2023-04-15 NOTE — Assessment & Plan Note (Signed)
Use testosterone at the same dose 200 mg every 10 days (not every 14 days) IM.  Potential benefits of a long term sex steroid  use as well as potential risks  and complications were explained to the patient and were aknowledged.

## 2023-04-15 NOTE — Assessment & Plan Note (Signed)
On Oxy  Potential benefits of a long term opioids use as well as potential risks (i.e. addiction risk, apnea etc) and complications (i.e. Somnolence, constipation and others) were explained to the patient and were aknowledged. Epid inj is pending today

## 2023-04-15 NOTE — Progress Notes (Signed)
Functional Pain Scale - descriptive words and definitions  Unmanageable (7)  Pain interferes with normal ADL's/nothing seems to help/sleep is very difficult/active distractions are very difficult to concentrate on. Severe range order  Average Pain 7-10   +Driver, -BT, -Dye Allergies.   Bil L3 TF

## 2023-04-15 NOTE — Patient Instructions (Signed)

## 2023-04-15 NOTE — Assessment & Plan Note (Signed)
On B complex

## 2023-04-15 NOTE — Progress Notes (Signed)
Subjective:  Patient ID: Adam Vaughn, male    DOB: December 31, 1975  Age: 47 y.o. MRN: [id number]  CC: Follow-up (2 [facility] F/U)   HPI Adam Vaughn presents for hypogonadism, LBP, dyslipidemia  Outpatient Medications Prior to Visit  Medication Sig Dispense Refill   Docusate Calcium (STOOL SOFTENER PO) Take 1 tablet by mouth daily.     esomeprazole (NEXIUM) 40 MG capsule Take 1 capsule (40 mg total) by mouth 2 (two) times daily before a meal. 60 capsule 11   ezetimibe (ZETIA) 10 MG tablet Take 1 tablet (10 mg total) by mouth daily. 90 tablet 3   fluticasone (FLONASE) 50 MCG/ACT nasal spray Place 2 sprays into both nostrils daily. 16 g 6   hyoscyamine (LEVSIN SL) 0.125 MG SL tablet Take 1 tablet (0.125 mg total) by mouth every 6 (six) hours as needed. 30 tablet 2   methylcellulose (CITRUCEL) oral powder Take 1 packet by mouth daily.     NEEDLE, DISP, 23 G (BD DISP NEEDLE) 23G X 1" MISC Use for intramuscular injections. 50 each 3   NEEDLE, DISP, 25 G (B-D DISP NEEDLE 25GX1") 25G X 1" MISC Use every two weeks to inject Testosterone medication. 50 each 3   Plecanatide (TRULANCE) 3 MG TABS Take 1 tablet (3 mg total) by mouth daily. For constipation 30 tablet 11   pregabalin (LYRICA) 150 MG capsule Take 1 capsule (150 mg total) by mouth 2 (two) times daily. 60 capsule 5   rosuvastatin (CRESTOR) 40 MG tablet Take 40 mg by mouth daily.     tadalafil (CIALIS) 5 MG tablet Take 5 mg by mouth daily.     tamsulosin (FLOMAX) 0.4 MG CAPS capsule Take 1 capsule (0.4 mg total) by mouth daily. 90 capsule 3   Vitamin D-Vitamin K (K2 PLUS D3) [phone] MCG-UNIT TABS 1 po daily 100 tablet 3   ondansetron (ZOFRAN) 4 MG tablet Take 1 tablet (4 mg total) by mouth every 8 (eight) hours as needed. 30 tablet 3   Oxycodone HCl 10 MG TABS Take 1 tablet (10 mg total) by mouth 3 (three) times daily as needed. 90 tablet 0   Oxycodone HCl 10 MG TABS Take 1 tablet (10 mg total) by mouth 3 (three) times daily as needed.  90 tablet 0   testosterone cypionate (DEPOTESTOSTERONE CYPIONATE) 200 MG/ML injection Use 200 mg IM every 10 days 10 mL 1   tiZANidine (ZANAFLEX) 4 MG tablet TAKE 1 TABLET BY MOUTH THREE TIMES DAILY 180 tablet 0   albuterol (PROAIR HFA) 108 (90 Base) MCG/ACT inhaler Inhale 2 puffs into the lungs every 6 (six) hours as needed for wheezing or shortness of breath. 3 each 1   metoprolol tartrate (LOPRESSOR) 100 MG tablet TAKE 2 HOURS PRIOR TO CT SCAN 1 tablet 0   predniSONE (STERAPRED UNI-PAK 21 TAB) 5 MG (21) TBPK tablet Take dosepak as directed 21 tablet 0   RELISTOR 150 MG TABS Take 1 tablet by mouth daily.     No facility-administered medications prior to visit.    ROS: Review of Systems  Constitutional:  Positive for fatigue. Negative for appetite change and unexpected weight change.  HENT:  Negative for congestion, nosebleeds, sneezing, sore throat and trouble swallowing.   Eyes:  Negative for itching and visual disturbance.  Respiratory:  Negative for cough.   Cardiovascular:  Negative for chest pain, palpitations and leg swelling.  Gastrointestinal:  Negative for abdominal distention, blood in stool, diarrhea and nausea.  Genitourinary:  Negative for  frequency and hematuria.  Musculoskeletal:  Positive for back pain and gait problem. Negative for joint swelling and neck pain.  Skin:  Negative for rash.  Neurological:  Negative for dizziness, tremors, speech difficulty and weakness.  Psychiatric/Behavioral:  Positive for dysphoric mood. Negative for agitation and sleep disturbance. The patient is not nervous/anxious.     Objective:  Ht 5\' 8"  (1.727 m)   BMI 29.65 kg/m   BP Readings from Last 3 Encounters:  02/11/23 118/70  01/31/23 (!) 144/92  01/08/23 122/74    Wt Readings from Last 3 Encounters:  02/11/23 195 lb (88.5 kg)  02/08/23 200 lb (90.7 kg)  01/31/23 200 lb (90.7 kg)    Physical Exam Constitutional:      General: He is not in acute distress.    Appearance:  Normal appearance. He is well-developed.     Comments: NAD  Eyes:     Conjunctiva/sclera: Conjunctivae normal.     Pupils: Pupils are equal, round, and reactive to light.  Neck:     Thyroid: No thyromegaly.     Vascular: No JVD.  Cardiovascular:     Rate and Rhythm: Normal rate and regular rhythm.     Heart sounds: Normal heart sounds. No murmur heard.    No friction rub. No gallop.  Pulmonary:     Effort: Pulmonary effort is normal. No respiratory distress.     Breath sounds: Normal breath sounds. No wheezing or rales.  Chest:     Chest wall: No tenderness.  Abdominal:     General: Bowel sounds are normal. There is no distension.     Palpations: Abdomen is soft. There is no mass.     Tenderness: There is no abdominal tenderness. There is no guarding or rebound.  Musculoskeletal:        General: Tenderness present. Normal range of motion.     Cervical back: Normal range of motion.     Right lower leg: No edema.     Left lower leg: No edema.  Lymphadenopathy:     Cervical: No cervical adenopathy.  Skin:    General: Skin is warm and dry.     Findings: No rash.  Neurological:     Mental Status: He is alert and oriented to person, place, and time.     Cranial Nerves: No cranial nerve deficit.     Motor: No abnormal muscle tone.     Coordination: Coordination normal.     Gait: Gait abnormal.     Deep Tendon Reflexes: Reflexes are normal and symmetric.  Psychiatric:        Behavior: Behavior normal.        Thought Content: Thought content normal.        Judgment: Judgment normal.     Lab Results  Component Value Date   WBC 6.8 01/08/2023   HGB 16.0 01/08/2023   HCT 48.0 01/08/2023   PLT 283.0 01/08/2023   GLUCOSE 104 (H) 01/08/2023   CHOL 164 01/11/2023   TRIG 141 01/11/2023   HDL 48 01/11/2023   LDLCALC 91 01/11/2023   ALT 54 (H) 01/11/2023   AST 28 01/11/2023   NA 137 01/08/2023   K 4.1 01/08/2023   CL 103 01/08/2023   CREATININE 0.94 01/08/2023   BUN 12  01/08/2023   CO2 29 01/08/2023   TSH 1.27 09/11/2022   PSA 1.83 09/04/2021   HGBA1C 5.5 09/04/2021    [place] LIMITED JOINT SPACE STRUCTURES LOW BILAT(NO LINKED CHARGES)  Result Date: 02/26/2023  Procedure: Real-time Ultrasound Guided Injection of left knee joint superior lateral patella space Device: Philips Affiniti 50G/GE Logiq Images permanently stored and available for review in PACS Verbal informed consent obtained.  Discussed risks and benefits of procedure. Warned about infection, bleeding, hyperglycemia damage to structures among others. Patient expresses understanding and agreement Time-out conducted.  Noted no overlying erythema, induration, or other signs of local infection.  Skin prepped in a sterile fashion.  Local anesthesia: Topical Ethyl chloride.  With sterile technique and under real time ultrasound guidance: 40 mg of Kenalog and 2 mL of Marcaine injected into knee joint. Fluid seen entering the joint capsule.  Completed without difficulty  Pain immediately resolved suggesting accurate placement of the medication.  Advised to call if fevers/chills, erythema, induration, drainage, or persistent bleeding.  Images permanently stored and available for review in the ultrasound unit. Impression: Technically successful ultrasound guided injection.    CT RENAL STONE STUDY  Result Date: 02/02/2023 CLINICAL DATA:  Bilateral flank pain.  Hematuria.  Nephrolithiasis. EXAM: CT ABDOMEN AND PELVIS WITHOUT CONTRAST TECHNIQUE: Multidetector CT imaging of the abdomen and pelvis was performed following the standard protocol without IV contrast. RADIATION DOSE REDUCTION: This exam was performed according to the departmental dose-optimization program which includes automated exposure control, adjustment of the mA and/or kV according to patient size and/or use of iterative reconstruction technique. COMPARISON:  11/19/2022 FINDINGS: Lower chest: No acute findings. Hepatobiliary: No mass visualized on this  unenhanced exam. Prior cholecystectomy. No evidence of biliary obstruction. Pancreas: No mass or inflammatory process visualized on this unenhanced exam. Spleen:  Within normal limits in size. Adrenals/Urinary tract: Multiple small renal calculi are again seen bilaterally, largest measuring 7 mm in the left kidney. No evidence of ureteral calculi or hydronephrosis. No bladder calculi identified. Stomach/Bowel: No evidence of obstruction, inflammatory process, or abnormal fluid collections. Normal appendix visualized. Vascular/Lymphatic: No pathologically enlarged lymph nodes identified. No evidence of abdominal aortic aneurysm. Reproductive:  No mass or other significant abnormality. Other:  None. Musculoskeletal:  No suspicious bone lesions identified. IMPRESSION: Bilateral nephrolithiasis. No evidence of ureteral calculi, hydronephrosis, or other acute findings. Electronically Signed   By: Danae Orleans M.D.   On: 02/02/2023 16:09    Assessment & Plan:   Problem List Items Addressed This Visit     Low back pain    On Vit D+K Oxy Rx Pregabaline On Testosterone inj      Relevant Medications   Oxycodone HCl 10 MG TABS   Oxycodone HCl 10 MG TABS   tiZANidine (ZANAFLEX) 4 MG tablet   Spinal stenosis of lumbar region with neurogenic claudication - Primary    On Oxy  Potential benefits of a long term opioids use as well as potential risks (i.e. addiction risk, apnea etc) and complications (i.e. Somnolence, constipation and others) were explained to the patient and were aknowledged. Epid inj is pending today      Relevant Medications   Oxycodone HCl 10 MG TABS   Oxycodone HCl 10 MG TABS   tiZANidine (ZANAFLEX) 4 MG tablet   Low vitamin B12 level    On B complex      Hypogonadism male    Use testosterone at the same dose 200 mg every 10 days (not every 14 days) IM.  Potential benefits of a long term sex steroid  use as well as potential risks  and complications were explained to the patient  and were aknowledged.      Relevant Medications   testosterone cypionate (DEPOTESTOSTERONE  CYPIONATE) 200 MG/ML injection      Meds ordered this encounter  Medications   Oxycodone HCl 10 MG TABS    Sig: Take 1 tablet (10 mg total) by mouth 3 (three) times daily as needed.    Dispense:  90 tablet    Refill:  0    Please fill on or after 05/15/23   ondansetron (ZOFRAN) 4 MG tablet    Sig: Take 1 tablet (4 mg total) by mouth every 8 (eight) hours as needed.    Dispense:  30 tablet    Refill:  3   Oxycodone HCl 10 MG TABS    Sig: Take 1 tablet (10 mg total) by mouth 3 (three) times daily as needed.    Dispense:  90 tablet    Refill:  0    Please fill on or after 04/15/23   testosterone cypionate (DEPOTESTOSTERONE CYPIONATE) 200 MG/ML injection    Sig: Use 200 mg IM every 10 days    Dispense:  10 mL    Refill:  1   tiZANidine (ZANAFLEX) 4 MG tablet    Sig: Take 1 tablet (4 mg total) by mouth 3 (three) times daily.    Dispense:  180 tablet    Refill:  0      Follow-up: Return in about 2 months (around 06/15/2023) for a follow-up visit.  Sonda Primes, MD

## 2023-04-15 NOTE — Progress Notes (Signed)
Adam Vaughn - 47 y.o. male MRN [id number]  Date of birth: 1975-11-14  Office Visit Note: Visit Date: 04/15/2023 PCP: Tresa Garter, MD Referred by: London Sheer, MD  Subjective: Chief Complaint  Patient presents with   Lower Back - Pain   HPI:  Adam Vaughn is a 47 y.o. male who comes in today for planned repeat Bilateral L3-4  Lumbar Transforaminal epidural steroid injection with fluoroscopic guidance.  The patient has failed conservative care including home exercise, medications, time and activity modification.  This injection will be diagnostic and hopefully therapeutic.  Please see requesting physician notes for further details and justification. Patient received more than 50% pain relief from prior injection. Last injection helped about 30% overall but it did relieve the left lower limb dysesthesia almost 100%. Right tingling still present.  Referring: Dr. Willia Craze   ROS Otherwise per HPI.  Assessment & Plan: Visit Diagnoses:    ICD-10-CM   1. Lumbar radiculopathy  M54.16 XR C-ARM NO REPORT    Epidural Steroid injection    methylPREDNISolone acetate (DEPO-MEDROL) injection 40 mg      Plan: No additional findings.   Meds & Orders:  Meds ordered this encounter  Medications   methylPREDNISolone acetate (DEPO-MEDROL) injection 40 mg    Orders Placed This Encounter  Procedures   XR C-ARM NO REPORT   Epidural Steroid injection    Follow-up: Return for visit to requesting provider as needed.   Procedures: No procedures performed  Lumbosacral Transforaminal Epidural Steroid Injection - Sub-Pedicular Approach with Fluoroscopic Guidance  Patient: Adam Vaughn      Date of Birth: 1975-11-09 MRN: [id number] PCP: Tresa Garter, MD      Visit Date: 04/15/2023   Universal Protocol:    Date/Time: 04/15/2023  Consent Given By: the patient  Position: PRONE  Additional Comments: Vital signs were monitored before and after the  procedure. Patient was prepped and draped in the usual sterile fashion. The correct patient, procedure, and site was verified.   Injection Procedure Details:   Procedure diagnoses: Lumbar radiculopathy [M54.16]    Meds Administered:  Meds ordered this encounter  Medications   methylPREDNISolone acetate (DEPO-MEDROL) injection 40 mg    Laterality: Bilateral  Location/Site: L3  Needle:5.0 in., 22 ga.  Short bevel or Quincke spinal needle  Needle Placement: Transforaminal  Findings:    -Comments: Excellent flow of contrast along the nerve, nerve root and into the epidural space.  Procedure Details: After squaring off the end-plates to get a true AP view, the C-arm was positioned so that an oblique view of the foramen as noted above was visualized. The target area is just inferior to the "nose of the scotty dog" or sub pedicular. The soft tissues overlying this structure were infiltrated with 2-3 ml. of 1% Lidocaine without Epinephrine.  The spinal needle was inserted toward the target using a "trajectory" view along the fluoroscope beam.  Under AP and lateral visualization, the needle was advanced so it did not puncture dura and was located close the 6 O'Clock position of the pedical in AP tracterory. Biplanar projections were used to confirm position. Aspiration was confirmed to be negative for CSF and/or blood. A 1-2 ml. volume of Isovue-250 was injected and flow of contrast was noted at each level. Radiographs were obtained for documentation purposes.   After attaining the desired flow of contrast documented above, a 0.5 to 1.0 ml test dose of 0.25% Marcaine was injected into each respective transforaminal  space.  The patient was observed for 90 seconds post injection.  After no sensory deficits were reported, and normal lower extremity motor function was noted,   the above injectate was administered so that equal amounts of the injectate were placed at each foramen (level) into the  transforaminal epidural space.   Additional Comments:  No complications occurred Dressing: 2 x 2 sterile gauze and Band-Aid    Post-procedure details: Patient was observed during the procedure. Post-procedure instructions were reviewed.  Patient left the clinic in stable condition.    Clinical History: EXAM: MRI LUMBAR SPINE WITHOUT CONTRAST   TECHNIQUE: Multiplanar, multisequence MR imaging of the lumbar spine was performed. No intravenous contrast was administered.   COMPARISON:  01/18/2021   FINDINGS: Segmentation:  Standard.   Alignment:  2 mm retrolisthesis of L3 on L4.   Vertebrae: No acute fracture, evidence of discitis, or aggressive bone lesion. 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   Conus medullaris and cauda equina: Conus extends to the T12-L1 level. Conus and cauda equina appear normal.   Paraspinal and other soft tissues: No acute paraspinal abnormality. Postsurgical changes in the posterior paraspinal soft tissues at L4-S1.   Disc levels:   Disc spaces: Posterior lumbar interbody fusion from L4 through S1.   T12-L1: No significant disc bulge. No neural foraminal stenosis. No central canal stenosis.   L1-L2: No significant disc bulge. No neural foraminal stenosis. No central canal stenosis.   L2-L3: Minimal broad-based disc bulge. No left foraminal stenosis. Mild right foraminal stenosis. No spinal stenosis.   L3-L4: Broad-based disc bulge flattening ventral thecal sac. Mild bilateral facet arthropathy. Mild bilateral foraminal stenosis. Mild spinal stenosis. Bilateral lateral recess stenosis.   L4-L5: Interbody fusion.  No foraminal or central canal stenosis.   L5-S1: Interbody fusion.  No foraminal or central canal stenosis.   IMPRESSION: 1. At L3-4 there is a broad-based disc bulge flattening ventral thecal sac. Mild bilateral facet arthropathy. Mild bilateral foraminal stenosis. Mild spinal stenosis. Bilateral lateral  recess stenosis. 2. Posterior lumbar interbody fusion from L4 through S1 without foraminal or central canal stenosis. 3. No acute osseous injury of the lumbar spine.     Electronically Signed   By: Elige Ko M.D.   On: 05/22/2022 10:20     Objective:  VS:  HT:    WT:   BMI:     BP:133/84  HR:97bpm  TEMP: ( )  RESP:  Physical Exam Vitals and nursing note reviewed.  Constitutional:      General: He is not in acute distress.    Appearance: Normal appearance. He is not ill-appearing.  HENT:     Head: Normocephalic and atraumatic.     Right Ear: External ear normal.     Left Ear: External ear normal.     Nose: No congestion.  Eyes:     Extraocular Movements: Extraocular movements intact.  Cardiovascular:     Rate and Rhythm: Normal rate.     Pulses: Normal pulses.  Pulmonary:     Effort: Pulmonary effort is normal. No respiratory distress.  Abdominal:     General: There is no distension.     Palpations: Abdomen is soft.  Musculoskeletal:        General: No tenderness or signs of injury.     Cervical back: Neck supple.     Right lower leg: No edema.     Left lower leg: No edema.     Comments: Patient has good distal  strength without clonus.  Skin:    Findings: No erythema or rash.  Neurological:     General: No focal deficit present.     Mental Status: He is alert and oriented to person, place, and time.     Sensory: No sensory deficit.     Motor: No weakness or abnormal muscle tone.     Coordination: Coordination normal.  Psychiatric:        Mood and Affect: Mood normal.        Behavior: Behavior normal.      Imaging: No results found.

## 2023-04-15 NOTE — Procedures (Signed)
Lumbosacral Transforaminal Epidural Steroid Injection - Sub-Pedicular Approach with Fluoroscopic Guidance  Patient: Adam Vaughn      Date of Birth: [birth year] MRN: [id number] PCP: Tresa Garter, MD      Visit Date: 04/15/2023   Universal Protocol:    Date/Time: 04/15/2023  Consent Given By: the patient  Position: PRONE  Additional Comments: Vital signs were monitored before and after the procedure. Patient was prepped and draped in the usual sterile fashion. The correct patient, procedure, and site was verified.   Injection Procedure Details:   Procedure diagnoses: Lumbar radiculopathy [M54.16]    Meds Administered:  Meds ordered this encounter  Medications   methylPREDNISolone acetate (DEPO-MEDROL) injection 40 mg    Laterality: Bilateral  Location/Site: L3  Needle:5.0 in., 22 ga.  Short bevel or Quincke spinal needle  Needle Placement: Transforaminal  Findings:    -Comments: Excellent flow of contrast along the nerve, nerve root and into the epidural space.  Procedure Details: After squaring off the end-plates to get a true AP view, the C-arm was positioned so that an oblique view of the foramen as noted above was visualized. The target area is just inferior to the "nose of the scotty dog" or sub pedicular. The soft tissues overlying this structure were infiltrated with 2-3 ml. of 1% Lidocaine without Epinephrine.  The spinal needle was inserted toward the target using a "trajectory" view along the fluoroscope beam.  Under AP and lateral visualization, the needle was advanced so it did not puncture dura and was located close the 6 O'Clock position of the pedical in AP tracterory. Biplanar projections were used to confirm position. Aspiration was confirmed to be negative for CSF and/or blood. A 1-2 ml. volume of Isovue-250 was injected and flow of contrast was noted at each level. Radiographs were obtained for documentation purposes.   After attaining the  desired flow of contrast documented above, a 0.5 to 1.0 ml test dose of 0.25% Marcaine was injected into each respective transforaminal space.  The patient was observed for 90 seconds post injection.  After no sensory deficits were reported, and normal lower extremity motor function was noted,   the above injectate was administered so that equal amounts of the injectate were placed at each foramen (level) into the transforaminal epidural space.   Additional Comments:  No complications occurred Dressing: 2 x 2 sterile gauze and Band-Aid    Post-procedure details: Patient was observed during the procedure. Post-procedure instructions were reviewed.  Patient left the clinic in stable condition.

## 2023-04-15 NOTE — Assessment & Plan Note (Signed)
On Vit D+K Oxy Rx Pregabaline On Testosterone inj

## 2023-04-22 ENCOUNTER — Ambulatory Visit: Payer: Medicare HMO | Admitting: Family Medicine

## 2023-04-22 ENCOUNTER — Ambulatory Visit (INDEPENDENT_AMBULATORY_CARE_PROVIDER_SITE_OTHER): Payer: Medicare HMO

## 2023-04-22 ENCOUNTER — Other Ambulatory Visit: Payer: Self-pay

## 2023-04-22 VITALS — BP 134/88 | HR 89 | Ht 68.0 in | Wt 198.0 lb

## 2023-04-22 DIAGNOSIS — M170 Bilateral primary osteoarthritis of knee: Secondary | ICD-10-CM

## 2023-04-22 DIAGNOSIS — M25562 Pain in left knee: Secondary | ICD-10-CM

## 2023-04-22 DIAGNOSIS — M25561 Pain in right knee: Secondary | ICD-10-CM

## 2023-04-22 DIAGNOSIS — G8929 Other chronic pain: Secondary | ICD-10-CM | POA: Diagnosis not present

## 2023-04-22 NOTE — Progress Notes (Signed)
Adam Payor, PhD, LAT, ATC acting as a scribe for Adam Graham, MD.  Adam Vaughn is a 47 y.o. male who presents to [facility] today for cont'd L knee pain. He underwent a R knee arthroscopy with meniscal debridement on 12/31/2022. Pt was last seen by Dr. Denyse Amass on 01/31/23 and was given a L knee steroid injection.  Today, pt reports both knees are feeling awful today, L>R. He is having to ambulate w/ a cane. He locates pain on his L knee to around the medial joint line. Prior L knee steroid injection lasted for about 1 month.   Dx imaging: 12/14/22 R knee MRI 07/12/22 R knee XR 11/21/2021 L knee XR             11/03/2020 bilat knee standing XR 06/28/20 R and L knee XR   Pertinent review of systems: No fevers or chills  Relevant historical information: Chronic low back pain   Exam:  BP 134/88   Pulse 89   Ht 5\' 8"  (1.727 m)   Wt 198 lb (89.8 kg)   SpO2 94%   BMI 30.11 kg/m  General: Well Developed, well nourished, and in no acute distress.   MSK: Knees bilaterally normal-appearing Tender to palpation medial joint line bilateral knees. Intact strength.    Lab and Radiology Results  Procedure: Real-time Ultrasound Guided Injection of left knee joint superior lateral patella space Device: Philips Affiniti 50G/GE Logiq Images permanently stored and available for review in PACS Verbal informed consent obtained.  Discussed risks and benefits of procedure. Warned about infection, bleeding, hyperglycemia damage to structures among others. Patient expresses understanding and agreement Time-out conducted.   Noted no overlying erythema, induration, or other signs of local infection.   Skin prepped in a sterile fashion.   Local anesthesia: Topical Ethyl chloride.   With sterile technique and under real time ultrasound guidance: 40 mg of Kenalog and 2 mL of Marcaine injected into knee joint. Fluid seen entering the joint capsule.   Completed  without difficulty   Pain immediately resolved suggesting accurate placement of the medication.   Advised to call if fevers/chills, erythema, induration, drainage, or persistent bleeding.   Images permanently stored and available for review in the ultrasound unit.  Impression: Technically successful ultrasound guided injection.   Procedure: Real-time Ultrasound Guided Injection of right knee joint superior lateral patella space Device: Philips Affiniti 50G/GE Logiq Images permanently stored and available for review in PACS Verbal informed consent obtained.  Discussed risks and benefits of procedure. Warned about infection, bleeding, hyperglycemia damage to structures among others. Patient expresses understanding and agreement Time-out conducted.   Noted no overlying erythema, induration, or other signs of local infection.   Skin prepped in a sterile fashion.   Local anesthesia: Topical Ethyl chloride.   With sterile technique and under real time ultrasound guidance: 40 mg of Kenalog and 2 mL of Marcaine injected into knee joint. Fluid seen entering the joint capsule.   Completed without difficulty   Pain immediately resolved suggesting accurate placement of the medication.   Advised to call if fevers/chills, erythema, induration, drainage, or persistent bleeding.   Images permanently stored and available for review in the ultrasound unit.  Impression: Technically successful ultrasound guided injection.   X-ray images left knee obtained today personally and independently interpreted Medial compartment DJD.  No acute fractures are visible. Await formal radiology review    Assessment and Plan: 47 y.o. male with chronic bilateral knee pain  left worse than right.  He rates his left knee pain as severe today.  He an injection just about 3 months ago that only lasted a month.  X-ray today shows only mild arthritis changes.  Plan for MRI to further characterize source of pain for potential  surgical or injection planning.   PDMP not reviewed this encounter. Orders Placed This Encounter  Procedures   [place] LIMITED JOINT SPACE STRUCTURES LOW BILAT(NO LINKED CHARGES)    Order Specific Question:   Reason for Exam (SYMPTOM  OR DIAGNOSIS REQUIRED)    Answer:   bilateral knee pain    Order Specific Question:   Preferred imaging location?    Answer:   [facility] Knee AP/LAT W/[facility] Left    Standing Status:   Future    Number of Occurrences:   1    Standing Expiration Date:   05/23/2023    Order Specific Question:   Reason for Exam (SYMPTOM  OR DIAGNOSIS REQUIRED)    Answer:   left knee pain    Order Specific Question:   Preferred imaging location?    Answer:   [facility]   No orders of the defined types were placed in this encounter.    Discussed warning signs or symptoms. Please see discharge instructions. Patient expresses understanding.   The above documentation has been reviewed and is accurate and complete Adam Vaughn, M.D.

## 2023-04-22 NOTE — Patient Instructions (Signed)
Thank you for coming in today.   Please get an Xray today before you leave   You received an injection today. Seek immediate medical attention if the joint becomes red, extremely painful, or is oozing fluid.

## 2023-04-23 ENCOUNTER — Encounter: Payer: Self-pay | Admitting: *Deleted

## 2023-04-23 ENCOUNTER — Ambulatory Visit: Payer: Medicare HMO | Admitting: Family Medicine

## 2023-04-29 ENCOUNTER — Encounter: Payer: Self-pay | Admitting: Family Medicine

## 2023-04-30 ENCOUNTER — Encounter: Payer: Self-pay | Admitting: Internal Medicine

## 2023-05-02 ENCOUNTER — Other Ambulatory Visit: Payer: Self-pay | Admitting: Internal Medicine

## 2023-05-02 DIAGNOSIS — E291 Testicular hypofunction: Secondary | ICD-10-CM

## 2023-05-02 MED ORDER — TESTOSTERONE CYPIONATE 200 MG/ML IM SOLN
INTRAMUSCULAR | 1 refills | Status: DC
Start: 2023-05-02 — End: 2023-06-17

## 2023-05-02 NOTE — Progress Notes (Signed)
Left knee x-ray looks normal to radiology

## 2023-05-06 ENCOUNTER — Encounter: Payer: Self-pay | Admitting: Family Medicine

## 2023-05-11 ENCOUNTER — Other Ambulatory Visit: Payer: Medicare HMO

## 2023-05-13 ENCOUNTER — Ambulatory Visit: Payer: Medicare HMO | Admitting: Nurse Practitioner

## 2023-05-27 ENCOUNTER — Ambulatory Visit
Admission: RE | Admit: 2023-05-27 | Discharge: 2023-05-27 | Disposition: A | Discharge status: Home / Self Care | Payer: Medicare HMO | Source: Ambulatory Visit | Attending: Family Medicine | Admitting: Family Medicine

## 2023-05-27 DIAGNOSIS — G8929 Other chronic pain: Secondary | ICD-10-CM

## 2023-05-27 DIAGNOSIS — M170 Bilateral primary osteoarthritis of knee: Secondary | ICD-10-CM

## 2023-05-27 DIAGNOSIS — M25562 Pain in left knee: Secondary | ICD-10-CM | POA: Diagnosis not present

## 2023-05-27 DIAGNOSIS — M23322 Other meniscus derangements, posterior horn of medial meniscus, left knee: Secondary | ICD-10-CM | POA: Diagnosis not present

## 2023-05-28 NOTE — Telephone Encounter (Signed)
Error

## 2023-06-03 ENCOUNTER — Encounter: Payer: Self-pay | Admitting: Internal Medicine

## 2023-06-10 ENCOUNTER — Telehealth: Payer: Self-pay | Admitting: Internal Medicine

## 2023-06-10 NOTE — Telephone Encounter (Signed)
Prescription Request  06/10/2023  LOV: 04/15/2023  What is the name of the medication or equipment? pregabalin (LYRICA) 150 MG capsule  Have you contacted your pharmacy to request a refill? Yes   Which pharmacy would you like this sent to?   [facility] [address] ([address] [place] - [address] [place]) [place] [address] Phone: [phone] Fax: [phone]    Patient notified that their request is being sent to the clinical staff for review and that they should receive a response within 2 business days.   Please advise at Mobile [phone] (mobile)   Pharmacy called requesting refill on the medication listed above.

## 2023-06-11 ENCOUNTER — Ambulatory Visit: Payer: Medicare HMO | Admitting: Nurse Practitioner

## 2023-06-12 ENCOUNTER — Encounter: Payer: Self-pay | Admitting: Family Medicine

## 2023-06-12 ENCOUNTER — Other Ambulatory Visit: Payer: Self-pay

## 2023-06-12 DIAGNOSIS — G8929 Other chronic pain: Secondary | ICD-10-CM

## 2023-06-12 MED ORDER — PREGABALIN 150 MG PO CAPS: 150.0000 mg | ORAL_CAPSULE | Freq: Two times a day (BID) | ORAL | 5 refills | Status: DC

## 2023-06-12 NOTE — Telephone Encounter (Signed)
Okay. Thank you.

## 2023-06-12 NOTE — Progress Notes (Signed)
MRI does show a meniscus tear and some arthritis in the knee joint.  Would you like a referral to orthopedic surgery?  I believe you have seen Dr. August Saucer before.

## 2023-06-14 ENCOUNTER — Ambulatory Visit: Payer: Medicare HMO

## 2023-06-17 ENCOUNTER — Ambulatory Visit (INDEPENDENT_AMBULATORY_CARE_PROVIDER_SITE_OTHER): Payer: Medicare HMO | Admitting: Internal Medicine

## 2023-06-17 VITALS — BP 120/80 | HR 82 | Temp 98.6°F | Ht 68.0 in | Wt 198.0 lb

## 2023-06-17 DIAGNOSIS — M48062 Spinal stenosis, lumbar region with neurogenic claudication: Secondary | ICD-10-CM | POA: Diagnosis not present

## 2023-06-17 DIAGNOSIS — R7989 Other specified abnormal findings of blood chemistry: Secondary | ICD-10-CM

## 2023-06-17 DIAGNOSIS — L24A2 Irritant contact dermatitis due to fecal, urinary or dual incontinence: Secondary | ICD-10-CM

## 2023-06-17 DIAGNOSIS — E291 Testicular hypofunction: Secondary | ICD-10-CM | POA: Diagnosis not present

## 2023-06-17 DIAGNOSIS — G8929 Other chronic pain: Secondary | ICD-10-CM

## 2023-06-17 DIAGNOSIS — M545 Low back pain, unspecified: Secondary | ICD-10-CM

## 2023-06-17 DIAGNOSIS — L259 Unspecified contact dermatitis, unspecified cause: Secondary | ICD-10-CM | POA: Insufficient documentation

## 2023-06-17 MED ORDER — ONDANSETRON HCL 4 MG PO TABS: 4.0000 mg | ORAL_TABLET | Freq: Three times a day (TID) | ORAL | 3 refills | Status: DC | PRN

## 2023-06-17 MED ORDER — OXYCODONE HCL 10 MG PO TABS: 10.0000 mg | ORAL_TABLET | Freq: Three times a day (TID) | ORAL | 0 refills | Status: DC | PRN

## 2023-06-17 MED ORDER — METHYLPREDNISOLONE ACETATE 80 MG/ML IJ SUSP
80.0000 mg | Freq: Once | INTRAMUSCULAR | Status: AC
  Administered 2023-06-17: 80 mg via INTRAMUSCULAR

## 2023-06-17 MED ORDER — TESTOSTERONE CYPIONATE 200 MG/ML IM SOLN: INTRAMUSCULAR | 1 refills | Status: DC

## 2023-06-17 MED ORDER — TRIAMCINOLONE ACETONIDE 0.5 % EX CREA: 1.0000 | TOPICAL_CREAM | Freq: Four times a day (QID) | CUTANEOUS | 1 refills | Status: DC

## 2023-06-17 NOTE — Assessment & Plan Note (Signed)
On Vit D+K Oxy Rx Pregabaline On Testosterone inj

## 2023-06-17 NOTE — Assessment & Plan Note (Signed)
On B complex 

## 2023-06-17 NOTE — Addendum Note (Signed)
Addended by: Delsa Grana R on: 06/17/2023 04:25 PM   Modules accepted: Orders

## 2023-06-17 NOTE — Addendum Note (Signed)
Addended by: Delsa Grana R on: 06/17/2023 04:24 PM   Modules accepted: Orders

## 2023-06-17 NOTE — Progress Notes (Signed)
Subjective:  Patient ID: Adam Vaughn, male    DOB: [birth year]  Age: 47 y.o. MRN: [id number]  CC: Medical Management of Chronic Issues (2 mnth f/u)   HPI Adam Vaughn presents for LBP, hypogonadism, nausea  C/o rash  Outpatient Medications Prior to Visit  Medication Sig Dispense Refill   Docusate Calcium (STOOL SOFTENER PO) Take 1 tablet by mouth daily.     esomeprazole (NEXIUM) 40 MG capsule Take 1 capsule (40 mg total) by mouth 2 (two) times daily before a meal. 60 capsule 11   ezetimibe (ZETIA) 10 MG tablet Take 1 tablet (10 mg total) by mouth daily. 90 tablet 3   fluticasone (FLONASE) 50 MCG/ACT nasal spray Place 2 sprays into both nostrils daily. 16 g 6   hyoscyamine (LEVSIN SL) 0.125 MG SL tablet Take 1 tablet (0.125 mg total) by mouth every 6 (six) hours as needed. 30 tablet 2   methylcellulose (CITRUCEL) oral powder Take 1 packet by mouth daily.     NEEDLE, DISP, 23 G (BD DISP NEEDLE) 23G X 1" MISC Use for intramuscular injections. 50 each 3   NEEDLE, DISP, 25 G (B-D DISP NEEDLE 25GX1") 25G X 1" MISC Use every two weeks to inject Testosterone medication. 50 each 3   Plecanatide (TRULANCE) 3 MG TABS Take 1 tablet (3 mg total) by mouth daily. For constipation 30 tablet 11   pregabalin (LYRICA) 150 MG capsule Take 1 capsule (150 mg total) by mouth 2 (two) times daily. 60 capsule 5   rosuvastatin (CRESTOR) 40 MG tablet Take 40 mg by mouth daily.     tadalafil (CIALIS) 5 MG tablet Take 5 mg by mouth daily.     tamsulosin (FLOMAX) 0.4 MG CAPS capsule Take 1 capsule (0.4 mg total) by mouth daily. 90 capsule 3   tiZANidine (ZANAFLEX) 4 MG tablet Take 1 tablet (4 mg total) by mouth 3 (three) times daily. 180 tablet 0   Vitamin D-Vitamin K (K2 PLUS D3) [phone] MCG-UNIT TABS 1 po daily 100 tablet 3   ondansetron (ZOFRAN) 4 MG tablet Take 1 tablet (4 mg total) by mouth every 8 (eight) hours as needed. 30 tablet 3   Oxycodone HCl 10 MG TABS Take 1 tablet (10 mg total) by mouth  3 (three) times daily as needed. 90 tablet 0   Oxycodone HCl 10 MG TABS Take 1 tablet (10 mg total) by mouth 3 (three) times daily as needed. 90 tablet 0   testosterone cypionate (DEPOTESTOSTERONE CYPIONATE) 200 MG/ML injection Use 200 mg IM every 10 days 10 mL 1   albuterol (PROAIR HFA) 108 (90 Base) MCG/ACT inhaler Inhale 2 puffs into the lungs every 6 (six) hours as needed for wheezing or shortness of breath. 3 each 1   No facility-administered medications prior to visit.    ROS: Review of Systems  Constitutional:  Negative for appetite change, fatigue and unexpected weight change.  HENT:  Negative for congestion, nosebleeds, sneezing, sore throat and trouble swallowing.   Eyes:  Negative for itching and visual disturbance.  Respiratory:  Negative for cough.   Cardiovascular:  Negative for chest pain, palpitations and leg swelling.  Gastrointestinal:  Positive for nausea. Negative for abdominal distention, blood in stool and diarrhea.  Genitourinary:  Negative for frequency and hematuria.  Musculoskeletal:  Positive for back pain and gait problem. Negative for joint swelling, neck pain and neck stiffness.  Skin:  Negative for rash.  Neurological:  Negative for dizziness, tremors, speech difficulty and weakness.  Psychiatric/Behavioral:  Positive for dysphoric mood. Negative for agitation, decreased concentration and sleep disturbance. The patient is not nervous/anxious.     Objective:  BP 120/80 (BP Location: Right Arm, Patient Position: Sitting, Cuff Size: Normal)   Pulse 82   Temp 98.6 F (37 C) (Oral)   Ht 5\' 8"  (1.727 m)   Wt 198 lb (89.8 kg)   SpO2 98%   BMI 30.11 kg/m   BP Readings from Last 3 Encounters:  06/17/23 120/80  04/22/23 134/88  04/15/23 133/84    Wt Readings from Last 3 Encounters:  06/17/23 198 lb (89.8 kg)  04/22/23 198 lb (89.8 kg)  02/11/23 195 lb (88.5 kg)    Physical Exam Constitutional:      General: He is not in acute distress.     Appearance: He is well-developed. He is obese.     Comments: NAD  Eyes:     Conjunctiva/sclera: Conjunctivae normal.     Pupils: Pupils are equal, round, and reactive to light.  Neck:     Thyroid: No thyromegaly.     Vascular: No JVD.  Cardiovascular:     Rate and Rhythm: Normal rate and regular rhythm.     Heart sounds: Normal heart sounds. No murmur heard.    No friction rub. No gallop.  Pulmonary:     Effort: Pulmonary effort is normal. No respiratory distress.     Breath sounds: Normal breath sounds. No wheezing or rales.  Chest:     Chest wall: No tenderness.  Abdominal:     General: Bowel sounds are normal. There is no distension.     Palpations: Abdomen is soft. There is no mass.     Tenderness: There is no abdominal tenderness. There is no guarding or rebound.  Musculoskeletal:        General: No tenderness. Normal range of motion.     Cervical back: Normal range of motion.  Lymphadenopathy:     Cervical: No cervical adenopathy.  Skin:    General: Skin is warm and dry.     Findings: No rash.  Neurological:     Mental Status: He is alert and oriented to person, place, and time.     Cranial Nerves: No cranial nerve deficit.     Motor: No abnormal muscle tone.     Coordination: Coordination normal.     Gait: Gait normal.     Deep Tendon Reflexes: Reflexes are normal and symmetric.  Psychiatric:        Behavior: Behavior normal.        Thought Content: Thought content normal.        Judgment: Judgment normal.   Rash on arms  Lab Results  Component Value Date   WBC 6.8 01/08/2023   HGB 16.0 01/08/2023   HCT 48.0 01/08/2023   PLT 283.0 01/08/2023   GLUCOSE 104 (H) 01/08/2023   CHOL 164 01/11/2023   TRIG 141 01/11/2023   HDL 48 01/11/2023   LDLCALC 91 01/11/2023   ALT 54 (H) 01/11/2023   AST 28 01/11/2023   NA 137 01/08/2023   K 4.1 01/08/2023   CL 103 01/08/2023   CREATININE 0.94 01/08/2023   BUN 12 01/08/2023   CO2 29 01/08/2023   TSH 1.27 09/11/2022    PSA 1.83 09/04/2021   HGBA1C 5.5 09/04/2021    MR Knee Left  Wo Contrast Result Date: 06/12/2023 CLINICAL DATA:  Chronic bilateral knee pain EXAM: MRI OF THE LEFT KNEE WITHOUT CONTRAST TECHNIQUE: Multiplanar, multisequence MR  imaging of the knee was performed. No intravenous contrast was administered. COMPARISON:  None Available. FINDINGS: MENISCI Medial: Oblique tear of the posterior horn-body junction of the medial meniscus extending to the inferior articular surface and extending into the posterior horn. Lateral: Intact. LIGAMENTS Cruciates: ACL and PCL are intact. Collaterals: Medial collateral ligament is intact. Lateral collateral ligament complex is intact. CARTILAGE Patellofemoral: Partial-thickness cartilage loss of the medial patellar facet with cartilage fissuring and subchondral marrow edema. Medial: Mild partial-thickness cartilage loss of the weight-bearing medial femorotibial compartment. Lateral:  No chondral defect. JOINT: No joint effusion. Normal Hoffa's fat-pad. No plical thickening. POPLITEAL FOSSA: Popliteus tendon is intact. No Baker's cyst. EXTENSOR MECHANISM: Intact quadriceps tendon. Mild proximal patellar tendinosis. Intact lateral patellar retinaculum. Intact medial patellar retinaculum. Intact MPFL. BONES: No aggressive osseous lesion. No fracture or dislocation. Other: No fluid collection or hematoma. Muscles are normal. IMPRESSION: 1. Oblique tear of the posterior horn-body junction of the medial meniscus extending to the inferior articular surface and extending into the posterior horn. 2. Partial-thickness cartilage loss of the medial patellar facet with cartilage fissuring and subchondral marrow edema. 3. Mild partial-thickness cartilage loss of the weight-bearing medial femorotibial compartment. Electronically Signed   By: Elige Ko M.D.   On: 06/12/2023 08:00    Assessment & Plan:   Problem List Items Addressed This Visit     Low back pain - Primary   On Vit  D+K Oxy Rx Pregabaline On Testosterone inj      Relevant Medications   Oxycodone HCl 10 MG TABS   Oxycodone HCl 10 MG TABS   Spinal stenosis of lumbar region with neurogenic claudication   On Oxy  Potential benefits of a long term opioids use as well as potential risks (i.e. addiction risk, apnea etc) and complications (i.e. Somnolence, constipation and others) were explained to the patient and were aknowledged. Epid inj is pending today      Relevant Medications   Oxycodone HCl 10 MG TABS   Oxycodone HCl 10 MG TABS   Low vitamin B12 level   On B complex      Hypogonadism male   Use testosterone at the same dose 200 mg every 10 days (not every 14 days) IM.  Potential benefits of a long term sex steroid  use as well as potential risks  and complications were explained to the patient and were aknowledged.`      Relevant Medications   testosterone cypionate (DEPOTESTOSTERONE CYPIONATE) 200 MG/ML injection      Meds ordered this encounter  Medications   ondansetron (ZOFRAN) 4 MG tablet    Sig: Take 1 tablet (4 mg total) by mouth every 8 (eight) hours as needed.    Dispense:  30 tablet    Refill:  3   Oxycodone HCl 10 MG TABS    Sig: Take 1 tablet (10 mg total) by mouth 3 (three) times daily as needed.    Dispense:  90 tablet    Refill:  0    Please fill on or after 07/14/23   Oxycodone HCl 10 MG TABS    Sig: Take 1 tablet (10 mg total) by mouth 3 (three) times daily as needed.    Dispense:  90 tablet    Refill:  0    Please fill on or after 06/14/23   testosterone cypionate (DEPOTESTOSTERONE CYPIONATE) 200 MG/ML injection    Sig: Use 200 mg IM every 10 days    Dispense:  10 mL    Refill:  1      Follow-up: No follow-ups on file.  Sonda Primes, MD

## 2023-06-17 NOTE — Assessment & Plan Note (Signed)
Use testosterone at the same dose 200 mg every 10 days (not every 14 days) IM.  Potential benefits of a long term sex steroid  use as well as potential risks  and complications were explained to the patient and were aknowledged.

## 2023-06-17 NOTE — Assessment & Plan Note (Signed)
On Oxy  Potential benefits of a long term opioids use as well as potential risks (i.e. addiction risk, apnea etc) and complications (i.e. Somnolence, constipation and others) were explained to the patient and were aknowledged. Epid inj is pending today

## 2023-06-18 LAB — COMPREHENSIVE METABOLIC PANEL
ALT: 55 U/L — ABNORMAL HIGH (ref 0–53)
AST: 27 U/L (ref 0–37)
Albumin: 4.3 g/dL (ref 3.5–5.2)
Alkaline Phosphatase: 79 U/L (ref 39–117)
BUN: 12 mg/dL (ref 6–23)
CO2: 26 meq/L (ref 19–32)
Calcium: 8.9 mg/dL (ref 8.4–10.5)
Chloride: 107 meq/L (ref 96–112)
Creatinine, Ser: 0.93 mg/dL (ref 0.40–1.50)
GFR: 97.58 mL/min (ref >60.00)
Glucose, Bld: 90 mg/dL (ref 70–99)
Potassium: 3.8 meq/L (ref 3.5–5.1)
Sodium: 140 meq/L (ref 135–145)
Total Bilirubin: 0.4 mg/dL (ref 0.2–1.2)
Total Protein: 6.3 g/dL (ref 6.0–8.3)

## 2023-06-18 LAB — TESTOSTERONE: Testosterone: 328.64 ng/dL (ref 300.00–890.00)

## 2023-06-18 LAB — TSH: TSH: 0.84 u[IU]/mL (ref 0.35–5.50)

## 2023-07-08 ENCOUNTER — Other Ambulatory Visit (HOSPITAL_COMMUNITY): Payer: Self-pay

## 2023-07-08 ENCOUNTER — Ambulatory Visit: Payer: Medicare HMO

## 2023-07-08 ENCOUNTER — Ambulatory Visit (INDEPENDENT_AMBULATORY_CARE_PROVIDER_SITE_OTHER): Payer: Medicare HMO | Admitting: Orthopedic Surgery

## 2023-07-08 DIAGNOSIS — S83242D Other tear of medial meniscus, current injury, left knee, subsequent encounter: Secondary | ICD-10-CM

## 2023-07-09 ENCOUNTER — Encounter: Payer: Self-pay | Admitting: Orthopedic Surgery

## 2023-07-09 MED ORDER — LIDOCAINE HCL 1 % IJ SOLN
5.0000 mL | INTRAMUSCULAR | Status: AC | PRN
  Administered 2023-07-08: 5 mL

## 2023-07-09 MED ORDER — BUPIVACAINE HCL 0.25 % IJ SOLN
4.0000 mL | INTRAMUSCULAR | Status: AC | PRN
  Administered 2023-07-08: 4 mL via INTRA_ARTICULAR

## 2023-07-09 MED ORDER — METHYLPREDNISOLONE ACETATE 40 MG/ML IJ SUSP
40.0000 mg | INTRAMUSCULAR | Status: AC | PRN
  Administered 2023-07-08: 40 mg via INTRA_ARTICULAR

## 2023-07-09 NOTE — Progress Notes (Signed)
 Office Visit Note   Patient: Adam Vaughn           Date of Birth: 12/28/1975           MRN: [id number] Visit Date: 07/08/2023 Requested by: Joane Artist RAMAN, MD [address] [place],  [place] [address] PCP: Plotnikov, Karlynn GAILS, MD  Subjective: Chief Complaint  Patient presents with   Left Knee - Pain    HPI: Adam Vaughn is a 48 y.o. male who presents to the office reporting left knee pain.  Since he was last seen has had an MRI scan.  That was ordered by Dr. Margart.  Has left knee pain with fusion and mechanical symptoms and he localizes it to the medial joint line.  Hard for him to squat and sit crosslegged.  Did have an injection over 2 months ago which is now wearing off.  All notes are reviewed.  His last injection was 04/22/2023..                ROS: All systems reviewed are negative as they relate to the chief complaint within the history of present illness.  Patient denies fevers or chills.  Assessment & Plan: Visit Diagnoses:  1. Acute medial meniscus tear of left knee, subsequent encounter     Plan: Impression is left knee medial meniscal tear.  Not too much arthritis in that medial compartment although it is present.  I think based on his age and mechanical symptoms and presence of effusion he would do well with arthroscopic meniscal debridement.  He did have that on the right-hand side and has done reasonably well with that.  Regarding injection he would like to try 1 more than his today.  When he becomes more symptomatic I think he is going to get the knee fixed.  He is having a little bit of right sided AC joint symptoms and may come in for an injection for that in the near future.  Follow-up with us  as needed.  Injection performed today without complication.  Follow-Up Instructions: No follow-ups on file.   Orders:  No orders of the defined types were placed in this encounter.  No orders of the defined types were placed in this encounter.      Procedures: Large Joint Inj: L knee on 07/08/2023 7:20 AM Indications: diagnostic evaluation, joint swelling and pain Details: 18 G 1.5 in needle, superolateral approach  Arthrogram: No  Medications: 5 mL lidocaine  1 %; 40 mg methylPREDNISolone  acetate 40 MG/ML; 4 mL bupivacaine  0.25 % Outcome: tolerated well, no immediate complications Procedure, treatment alternatives, risks and benefits explained, specific risks discussed. Consent was given by the patient. Immediately prior to procedure a time out was called to verify the correct patient, procedure, equipment, support staff and site/side marked as required. Patient was prepped and draped in the usual sterile fashion.       Clinical Data: No additional findings.  Objective: Vital Signs: There were no vitals taken for this visit.  Physical Exam:  Constitutional: Patient appears well-developed HEENT:  Head: Normocephalic Eyes:EOM are normal Neck: Normal range of motion Cardiovascular: Normal rate Pulmonary/chest: Effort normal Neurologic: Patient is alert Skin: Skin is warm Psychiatric: Patient has normal mood and affect  Ortho Exam: Ortho exam demonstrates full active and passive range of motion of the left knee.  Trace effusion is present.  Medial joint line tenderness is present.  Collateral crucial ligaments are stable.  No masses lymphadenopathy or skin changes noted in that  knee region.  Positive McMurray compression testing for medial compartment pathology on the left.  Specialty Comments:  EXAM: MRI LUMBAR SPINE WITHOUT CONTRAST   TECHNIQUE: Multiplanar, multisequence MR imaging of the lumbar spine was performed. No intravenous contrast was administered.   COMPARISON:  01/18/2021   FINDINGS: Segmentation:  Standard.   Alignment:  2 mm retrolisthesis of L3 on L4.   Vertebrae: No acute fracture, evidence of discitis, or aggressive bone lesion. 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   Conus  medullaris and cauda equina: Conus extends to the T12-L1 level. Conus and cauda equina appear normal.   Paraspinal and other soft tissues: No acute paraspinal abnormality. Postsurgical changes in the posterior paraspinal soft tissues at L4-S1.   Disc levels:   Disc spaces: Posterior lumbar interbody fusion from L4 through S1.   T12-L1: No significant disc bulge. No neural foraminal stenosis. No central canal stenosis.   L1-L2: No significant disc bulge. No neural foraminal stenosis. No central canal stenosis.   L2-L3: Minimal broad-based disc bulge. No left foraminal stenosis. Mild right foraminal stenosis. No spinal stenosis.   L3-L4: Broad-based disc bulge flattening ventral thecal sac. Mild bilateral facet arthropathy. Mild bilateral foraminal stenosis. Mild spinal stenosis. Bilateral lateral recess stenosis.   L4-L5: Interbody fusion.  No foraminal or central canal stenosis.   L5-S1: Interbody fusion.  No foraminal or central canal stenosis.   IMPRESSION: 1. At L3-4 there is a broad-based disc bulge flattening ventral thecal sac. Mild bilateral facet arthropathy. Mild bilateral foraminal stenosis. Mild spinal stenosis. Bilateral lateral recess stenosis. 2. Posterior lumbar interbody fusion from L4 through S1 without foraminal or central canal stenosis. 3. No acute osseous injury of the lumbar spine.     Electronically Signed   By: Julaine Blanch M.D.   On: 05/22/2022 10:20  Imaging: No results found.   PMFS History: Patient Active Problem List   Diagnosis Date Noted   Contact dermatitis 06/17/2023   Hematuria 01/08/2023   History of kidney stones 01/08/2023   Hypokalemia 12/10/2022   Foot pain, bilateral 11/12/2022   Hypogonadism male 09/19/2022   Chronic eczematous otitis externa of both ears 05/16/2022   Hypertrophy of inferior nasal turbinate 05/16/2022   Situational depression 05/14/2022   Coronary atherosclerosis 03/26/2022   Dyslipidemia 01/11/2022    Chronic pain syndrome 11/07/2021   Piriformis syndrome 11/07/2021   Bladder neck obstruction 09/04/2021   Asthmatic bronchitis 09/04/2021   Pain in right shoulder 06/29/2021   Meteorism 06/01/2021   Sinus congestion 02/28/2021   Vertigo 02/17/2021   Headache 02/17/2021   Fever 02/17/2021   Falls 02/17/2021   Allergic rhinitis 10/25/2020   Family history of acute polio 10/25/2020   Chest pain, atypical 10/10/2020   S/P laparoscopic cholecystectomy 10/10/2020   Hyperglycemia 10/10/2020   Acute cholecystitis 09/30/2020   Insomnia 09/26/2020   Knee pain 06/23/2020   Low vitamin B12 level 05/31/2020   Ankle sprain 05/31/2020   Constipation 05/31/2020   Degenerative spondylolisthesis 04/11/2020   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   DDD (degenerative disc disease), lumbar 01/20/2020   Left lumbar radiculitis 01/20/2020   Vitamin D  deficiency 01/15/2020   Low back pain 01/12/2020   IBS (irritable bowel syndrome) 01/12/2020   Nephrolithiasis 01/12/2020   Erectile dysfunction 01/12/2020   GERD (gastroesophageal reflux disease) 01/12/2020   Past Medical History:  Diagnosis Date   Arthritis    Chronic back pain    Family history of colon cancer  sister   GERD (gastroesophageal reflux disease)    History of kidney stones    Hyperlipidemia    Hypertension     Family History  Problem Relation Age of Onset   Heart disease Father 78       MI   Hypertension Father    Heart attack Father    Other Father        Small intestine removed but does not know why   Diabetes Sister    Heart disease Sister 87       MI   Colon cancer Sister    Colon polyps Sister    Colon polyps Sister    Healthy Sister    Healthy Sister    Healthy Sister    Other Brother        MVA   Healthy Brother    Healthy Brother    Healthy Brother    Healthy Brother    Healthy Brother    Healthy Brother    Liver disease Neg Hx    Pancreatic cancer Neg Hx     Esophageal cancer Neg Hx    Stomach cancer Neg Hx     Past Surgical History:  Procedure Laterality Date   BACK SURGERY  04/11/2020   CHOLECYSTECTOMY N/A 10/01/2020   Procedure: LAPAROSCOPIC CHOLECYSTECTOMY;  Surgeon: Curvin Deward MOULD, MD;  Location: [facility];  Service: General;  Laterality: N/A;   COLONOSCOPY  1991   IBS   endocolon  09/15/2021   [facility], GERD   HAND SURGERY Bilateral    SHOULDER SURGERY Right 08/2021   SPINAL CORD STIMULATOR IMPLANT  2022   SPINAL CORD STIMULATOR REMOVAL  2022   TONSILLECTOMY     VASECTOMY     Social History   Occupational History   Not on file  Tobacco Use   Smoking status: Never   Smokeless tobacco: Never  Vaping Use   Vaping status: Never Used  Substance and Sexual Activity   Alcohol use: Not Currently    Comment: States quit around age 31   Drug use: Never   Sexual activity: Not Currently    Birth control/protection: Surgical    Comment: vastectomy

## 2023-07-13 NOTE — Progress Notes (Signed)
 Cardiology Office Note    Date:  07/15/2023  ID:  Adam Vaughn, DOB 1975/07/26, MRN [id number] PCP:  Garald Karlynn GAILS, MD  Cardiologist:  Redell Shallow, MD  Electrophysiologist:  None   Chief Complaint: Follow up for CAD   History of Present Illness: .    Adam Vaughn is a 48 y.o. male with visit-pertinent history of mild nonobstructive CAD and hyperlipidemia.   First evaluated by Dr. Shallow on 10/11/2022 for an elevated calcium  score of 6 which was 77 percentile in September 2023.  Patient reported an occasional sharp pain in his left chest lasting a few seconds, not related to activities.  He was started on aspirin  81 mg daily and Crestor  40 mg daily.  On 10/17/2022 he underwent coronary CTA which indicated a coronary calcium  score of 4, this was 72nd percentile for age and sex matched control, there was minimal nonobstructive CAD.  Echocardiogram on 10/12/2022 indicated LVEF of 60 to 65%, no RWMA.  Today he presents for follow-up.  He reports that he is doing well overall.  He reports that he continues to have an occasional sharp pain in his left chest lasting a few seconds but is not related to activity.  He notes some shortness of breath with prolonged activity.  Today we reviewed his coronary CTA and echocardiogram results.  He denies lower extremity edema, orthopnea, PND, presyncope or syncope.  ROS: .   Today he denies chest pain, shortness of breath, lower extremity edema, fatigue, palpitations, melena, hematuria, hemoptysis, diaphoresis, weakness, presyncope, syncope, orthopnea, and PND.  All other systems are reviewed and otherwise negative. Studies Reviewed: SABRA    EKG:  EKG is ordered today, personally reviewed, demonstrating  EKG Interpretation Date/Time:  Monday July 15 2023 15:10:45 EST Ventricular Rate:  89 PR Interval:  168 QRS Duration:  84 QT Interval:  338 QTC Calculation: 411 R Axis:   14  Text Interpretation: Normal sinus rhythm Nonspecific T  wave abnormality No significant change since last tracing Confirmed by Nevena Rozenberg [id number]) on 07/15/2023 3:14:22 PM   CV Studies: Cardiac Studies & Procedures      ECHOCARDIOGRAM  ECHOCARDIOGRAM COMPLETE 10/12/2022  Narrative ECHOCARDIOGRAM REPORT    Patient Name:   Adam Vaughn [facility] Date of Exam: 10/12/2022 Medical Rec #:  [id number]          Height:       68.0 in Accession #:    [id number]         Weight:       200.0 lb Date of Birth:  [birth year]          BSA:          2.044 m Patient Age:    47 years           BP:           120/82 mmHg Patient Gender: M                  HR:           74 bpm. Exam Location:  [address]  Procedure: 2D Echo, Cardiac Doppler, Color Doppler, 3D Echo and Strain Analysis  Indications:    Chest Pain R07.9  History:        Patient has no prior history of Echocardiogram examinations. Risk Factors:Hypertension and Dyslipidemia.  Sonographer:    Augustin Seals RDCS Referring Phys: [id number] BRIAN S CRENSHAW  IMPRESSIONS   1. Left ventricular ejection fraction, by estimation, is 60 to  65%. The left ventricle has normal function. The left ventricle has no regional wall motion abnormalities. Left ventricular diastolic parameters were normal. The average left ventricular global longitudinal strain is -21.2 %. The global longitudinal strain is normal. 2. Right ventricular systolic function is normal. The right ventricular size is normal. Tricuspid regurgitation signal is inadequate for assessing PA pressure. 3. The mitral valve is normal in structure. Trivial mitral valve regurgitation. No evidence of mitral stenosis. 4. The aortic valve is normal in structure. Aortic valve regurgitation is not visualized. No aortic stenosis is present. 5. The inferior vena cava is normal in size with greater than 50% respiratory variability, suggesting right atrial pressure of 3 mmHg.  FINDINGS Left Ventricle: Left ventricular ejection fraction, by estimation, is 60 to  65%. The left ventricle has normal function. The left ventricle has no regional wall motion abnormalities. The average left ventricular global longitudinal strain is -21.2 %. The global longitudinal strain is normal. The left ventricular internal cavity size was normal in size. There is no left ventricular hypertrophy. Left ventricular diastolic parameters were normal. Normal left ventricular filling pressure.  Right Ventricle: The right ventricular size is normal. No increase in right ventricular wall thickness. Right ventricular systolic function is normal. Tricuspid regurgitation signal is inadequate for assessing PA pressure.  Left Atrium: Left atrial size was normal in size.  Right Atrium: Right atrial size was normal in size.  Pericardium: There is no evidence of pericardial effusion.  Mitral Valve: The mitral valve is normal in structure. Trivial mitral valve regurgitation. No evidence of mitral valve stenosis.  Tricuspid Valve: The tricuspid valve is normal in structure. Tricuspid valve regurgitation is trivial. No evidence of tricuspid stenosis.  Aortic Valve: The aortic valve is normal in structure. Aortic valve regurgitation is not visualized. No aortic stenosis is present.  Pulmonic Valve: The pulmonic valve was normal in structure. Pulmonic valve regurgitation is trivial. No evidence of pulmonic stenosis.  Aorta: The aortic root is normal in size and structure.  Venous: The inferior vena cava is normal in size with greater than 50% respiratory variability, suggesting right atrial pressure of 3 mmHg.  IAS/Shunts: No atrial level shunt detected by color flow Doppler.   LEFT VENTRICLE PLAX 2D LVIDd:         4.70 cm   Diastology LVIDs:         3.10 cm   LV e' medial:    8.59 cm/s LV PW:         1.10 cm   LV E/e' medial:  9.5 LV IVS:        0.90 cm   LV e' lateral:   11.10 cm/s LVOT diam:     2.50 cm   LV E/e' lateral: 7.4 LV SV:         90 LV SV Index:   44        2D  Longitudinal Strain LVOT Area:     4.91 cm  2D Strain GLS (A2C):   -20.9 % 2D Strain GLS (A3C):   -21.4 % 2D Strain GLS (A4C):   -21.3 % 2D Strain GLS Avg:     -21.2 %  3D Volume EF: 3D EF:        60 % LV EDV:       166 ml LV ESV:       67 ml LV SV:        99 ml  RIGHT VENTRICLE RV Basal diam:  3.60 cm RV Mid diam:  3.90 cm RV S prime:     12.00 cm/s TAPSE (M-mode): 1.7 cm  LEFT ATRIUM             Index        RIGHT ATRIUM           Index LA diam:        4.00 cm 1.96 cm/m   RA Area:     10.50 cm LA Vol (A2C):   41.0 ml 20.06 ml/m  RA Volume:   19.10 ml  9.35 ml/m LA Vol (A4C):   54.4 ml 26.62 ml/m LA Biplane Vol: 49.5 ml 24.22 ml/m AORTIC VALVE LVOT Vmax:   96.50 cm/s LVOT Vmean:  63.100 cm/s LVOT VTI:    0.183 m  AORTA Ao Root diam: 3.50 cm Ao Asc diam:  3.30 cm  MITRAL VALVE MV Area (PHT): 4.86 cm    SHUNTS MV Decel Time: 156 msec    Systemic VTI:  0.18 m MV E velocity: 82.00 cm/s  Systemic Diam: 2.50 cm MV A velocity: 59.20 cm/s MV E/A ratio:  1.39  Wilbert Bihari MD Electronically signed by Wilbert Bihari MD Signature Date/Time: 10/12/2022/2:19:35 PM    Final    CT SCANS  CT CORONARY MORPH W/CTA COR W/SCORE 10/17/2022  Addendum 10/20/2022  5:05 PM ADDENDUM REPORT: 10/20/2022 17:02  EXAM: OVER-READ INTERPRETATION  CT CHEST  The following report is an over-read performed by radiologist Dr. Andrea Gasman of [facility] on 10/20/2022. This over-read does not include interpretation of cardiac or coronary anatomy or pathology. The coronary CTA interpretation by the cardiologist is attached.  COMPARISON:  Cardiac CT 03/23/2022  FINDINGS: Vascular: No aortic atherosclerosis. The included aorta is normal in caliber.  Mediastinum/nodes: No adenopathy or mass. Unremarkable esophagus.  Lungs: Mild hypoventilatory changes in the dependent lungs. Bandlike atelectasis in the right lower lobe. No pulmonary nodule. No pleural fluid. The  included airways are patent.  Upper abdomen: No acute or unexpected findings.  Musculoskeletal: There are no acute or suspicious osseous abnormalities. Midthoracic vertebral hemangioma, incidental  IMPRESSION: No acute or unexpected extracardiac findings.   Electronically Signed By: Andrea Gasman M.D. On: 10/20/2022 17:02  Narrative HISTORY: Chest pain/anginal equiv, intermediate CAD risk, not treadmill candidate  EXAM: Cardiac/Coronary  CT  TECHNIQUE: The patient was scanned on a [employer].  PROTOCOL: A 120 kV prospective scan was triggered in the descending thoracic aorta at 111 HU's. Axial non-contrast 3 mm slices were carried out through the heart. The data set was analyzed on a dedicated work station and scored using the Agatston method. Gantry rotation speed was 250 msecs and collimation was .6 mm. Beta blockade and 0.8 mg of sl NTG was given. The 3D data set was reconstructed in 5% intervals of the 35-75 % of the R-R cycle. Systolic and diastolic phases were analyzed on a dedicated work station using MPR, MIP and VRT modes. The patient received contrast: 100mL OMNIPAQUE  IOHEXOL  350 MG/ML SOLN.  FINDINGS: Image quality: Good  Noise artifact is: Limited  Coronary calcium  score is 4, which places the patient in the 72nd percentile for age and sex matched control.  Coronary arteries: Normal coronary origins.  Right dominance.  Right Coronary Artery: No detectable plaque or stenosis  Left Main Coronary Artery: Trivial calcification that represents minimal mixed atherosclerotic plaque in the distal LM, <25% stenosis.  Left Anterior Descending Coronary Artery: Minimal atherosclerotic plaque in the proximal LAD, <25% stenosis.  Left Circumflex Artery: No detectable plaque or  stenosis.  Aorta: Normal size, 29 mm at the mid ascending aorta (level of the PA bifurcation) measured double oblique.  Aortic Valve: No calcifications.  Other  findings:  Normal pulmonary vein drainage into the left atrium.  Normal left atrial appendage without thrombus.  Normal size of the pulmonary artery.  Please see separate report from [facility] for non-cardiac findings.  IMPRESSION: 1. Minimal CAD, <25% stenosis, CADRADS 1.  2. Total plaque volume 9 mm3 which is 27th percentile for age- and sex-matched controls (calcified plaque 2 mm3; non-calcified plaque 7 mm3). TPV is mild.  3. Coronary calcium  score of 4. This was 72nd percentile for age and sex matched control.  4. Normal coronary origins with right dominance.  RECOMMENDATIONS: CAD-RADS 1. Minimal non-obstructive CAD (0-24%). Consider non-atherosclerotic causes of chest pain. Consider preventive therapy and risk factor modification.  Electronically Signed: By: Soyla Merck M.D. On: 10/17/2022 13:14          Current Reported Medications:.    Current Meds  Medication Sig   Docusate Calcium  (STOOL SOFTENER PO) Take 1 tablet by mouth daily.   esomeprazole  (NEXIUM ) 40 MG capsule Take 1 capsule (40 mg total) by mouth 2 (two) times daily before a meal.   ezetimibe  (ZETIA ) 10 MG tablet Take 1 tablet (10 mg total) by mouth daily.   hyoscyamine  (LEVSIN  SL) 0.125 MG SL tablet Take 1 tablet (0.125 mg total) by mouth every 6 (six) hours as needed.   methylcellulose (CITRUCEL) oral powder Take 1 packet by mouth daily.   NEEDLE, DISP, 23 G (BD DISP NEEDLE) 23G X 1 MISC Use for intramuscular injections.   NEEDLE, DISP, 25 G (B-D DISP NEEDLE 25GX1) 25G X 1 MISC Use every two weeks to inject Testosterone  medication.   ondansetron  (ZOFRAN ) 4 MG tablet Take 1 tablet (4 mg total) by mouth every 8 (eight) hours as needed.   Oxycodone  HCl 10 MG TABS Take 1 tablet (10 mg total) by mouth 3 (three) times daily as needed.   Oxycodone  HCl 10 MG TABS Take 1 tablet (10 mg total) by mouth 3 (three) times daily as needed.   Plecanatide  (TRULANCE ) 3 MG TABS Take 1 tablet (3 mg  total) by mouth daily. For constipation   pregabalin  (LYRICA ) 150 MG capsule Take 1 capsule (150 mg total) by mouth 2 (two) times daily.   rosuvastatin  (CRESTOR ) 40 MG tablet Take 40 mg by mouth daily.   tadalafil  (CIALIS ) 5 MG tablet Take 5 mg by mouth daily.   tamsulosin  (FLOMAX ) 0.4 MG CAPS capsule Take 1 capsule (0.4 mg total) by mouth daily.   testosterone  cypionate (DEPOTESTOSTERONE CYPIONATE) 200 MG/ML injection Use 200 mg IM every 10 days   tiZANidine  (ZANAFLEX ) 4 MG tablet Take 1 tablet (4 mg total) by mouth 3 (three) times daily.   triamcinolone  cream (KENALOG ) 0.5 % Apply 1 Application topically 4 (four) times daily.   Vitamin D -Vitamin K (K2 PLUS D3) [phone] MCG-UNIT TABS 1 po daily   Physical Exam:    VS:  BP 132/70 (BP Location: Left Arm, Patient Position: Sitting, Cuff Size: Normal)   Pulse 89   Ht 5' 8 (1.727 m)   Wt 198 lb 6.4 oz (90 kg)   SpO2 90%   BMI 30.17 kg/m    Wt Readings from Last 3 Encounters:  07/15/23 198 lb 6.4 oz (90 kg)  06/17/23 198 lb (89.8 kg)  04/22/23 198 lb (89.8 kg)    GEN: Well nourished, well developed in no acute distress  NECK: No JVD; No carotid bruits CARDIAC: RRR, no murmurs, rubs, gallops RESPIRATORY:  Clear to auscultation without rales, wheezing or rhonchi  ABDOMEN: Soft, non-tender, non-distended EXTREMITIES:  No edema; No acute deformity   Asessement and Plan:.   Mild nonobstructie CAD: On 10/17/2022 he underwent coronary CTA which indicated a coronary calcium  score of 4, this was 72nd percentile for age and sex matched control, there was minimal nonobstructive CAD. Today he reports occasional left-sided sharp pain that is fleeting and not associated with exertion. He notes occasional shortness of breath with exertion. He denies associated symptoms.  Overall reassuring with previous testing this is not cardiac related. Heart healthy diet and regular cardiovascular exercise encouraged.  Continue rosuvastatin  and  Zetia .  Hyperlipidemia: Last lipid profile on 01/11/2023 indicated total cholesterol 164, HDL 48, triglycerides 141 and LDL 91.  Check fasting lipid profile, recent AST 27, ALT 55 on 06/17/23.  Discussed that his LDL goal is less than 70 based on his coronary CTA results, if his LDL is not at goal we will plan to refer to Pharm.D. lipid clinic.  Continue rosuvastatin  40 mg daily and Zetia  10 mg daily pending lab results.    Disposition: F/u with Dr. Pietro in one year.   Signed, Claudina Oliphant D Coryn Mosso, NP

## 2023-07-15 ENCOUNTER — Ambulatory Visit: Payer: Medicare HMO | Attending: Cardiology | Admitting: Cardiology

## 2023-07-15 ENCOUNTER — Encounter: Payer: Self-pay | Admitting: Cardiology

## 2023-07-15 VITALS — BP 132/70 | HR 89 | Ht 68.0 in | Wt 198.4 lb

## 2023-07-15 DIAGNOSIS — E785 Hyperlipidemia, unspecified: Secondary | ICD-10-CM

## 2023-07-15 DIAGNOSIS — R072 Precordial pain: Secondary | ICD-10-CM | POA: Diagnosis not present

## 2023-07-15 DIAGNOSIS — I2510 Atherosclerotic heart disease of native coronary artery without angina pectoris: Secondary | ICD-10-CM

## 2023-07-15 NOTE — Patient Instructions (Signed)
 Medication Instructions:  No changes *If you need a refill on your cardiac medications before your next appointment, please call your pharmacy*  Lab Work: In the next 2 weeks we will need a fasting lipid panel to be drawn If you have labs (blood work) drawn today and your tests are completely normal, you will receive your results only by: MyChart Message (if you have MyChart) OR A paper copy in the mail If you have any lab test that is abnormal or we need to change your treatment, we will call you to review the results.  Testing/Procedures: No testing  Follow-Up: At [facility], you and your health needs are our priority.  As part of our continuing mission to provide you with exceptional heart care, we have created designated Provider Care Teams.  These Care Teams include your primary Cardiologist (physician) and Advanced Practice Providers (APPs -  Physician Assistants and Nurse Practitioners) who all work together to provide you with the care you need, when you need it.  We recommend signing up for the patient portal called MyChart.  Sign up information is provided on this After Visit Summary.  MyChart is used to connect with patients for Virtual Visits (Telemedicine).  Patients are able to view lab/test results, encounter notes, upcoming appointments, etc.  Non-urgent messages can be sent to your provider as well.   To learn more about what you can do with MyChart, go to [email or web address].    Your next appointment:   1 year(s)  Provider:   Redell Shallow, MD

## 2023-07-18 DIAGNOSIS — M47816 Spondylosis without myelopathy or radiculopathy, lumbar region: Secondary | ICD-10-CM | POA: Diagnosis not present

## 2023-07-18 DIAGNOSIS — M47812 Spondylosis without myelopathy or radiculopathy, cervical region: Secondary | ICD-10-CM | POA: Diagnosis not present

## 2023-07-22 ENCOUNTER — Ambulatory Visit: Payer: Medicare HMO | Admitting: Surgical

## 2023-07-25 DIAGNOSIS — E785 Hyperlipidemia, unspecified: Secondary | ICD-10-CM | POA: Diagnosis not present

## 2023-07-25 DIAGNOSIS — I2510 Atherosclerotic heart disease of native coronary artery without angina pectoris: Secondary | ICD-10-CM | POA: Diagnosis not present

## 2023-07-25 LAB — LIPID PANEL
Chol/HDL Ratio: 2.1 {ratio} (ref 0.0–5.0)
Cholesterol, Total: 124 mg/dL (ref 100–199)
HDL: 58 mg/dL (ref >39)
LDL Chol Calc (NIH): 53 mg/dL (ref 0–99)
Triglycerides: 63 mg/dL (ref 0–149)
VLDL Cholesterol Cal: 13 mg/dL (ref 5–40)

## 2023-07-29 ENCOUNTER — Encounter: Payer: Self-pay | Admitting: Internal Medicine

## 2023-07-29 ENCOUNTER — Encounter: Payer: Self-pay | Admitting: Podiatry

## 2023-07-29 ENCOUNTER — Other Ambulatory Visit: Payer: Self-pay

## 2023-07-29 ENCOUNTER — Other Ambulatory Visit (HOSPITAL_COMMUNITY): Payer: Self-pay

## 2023-07-29 DIAGNOSIS — Z008 Encounter for other general examination: Secondary | ICD-10-CM | POA: Diagnosis not present

## 2023-07-31 ENCOUNTER — Telehealth: Payer: Self-pay

## 2023-07-31 NOTE — Telephone Encounter (Signed)
Should get in next couple of weeks

## 2023-07-31 NOTE — Telephone Encounter (Signed)
Called patient advised of below they verbalized understanding.

## 2023-07-31 NOTE — Telephone Encounter (Signed)
-----   Message from [facility] sent at 07/29/2023  5:00 PM EST ----- Please let Mr. Reinoso know that his LDL or bad cholesterol is at goal. Good results! Continue current medications.

## 2023-08-02 ENCOUNTER — Other Ambulatory Visit: Payer: Self-pay

## 2023-08-02 ENCOUNTER — Ambulatory Visit (INDEPENDENT_AMBULATORY_CARE_PROVIDER_SITE_OTHER): Payer: Medicare HMO | Admitting: Surgical

## 2023-08-02 DIAGNOSIS — M25511 Pain in right shoulder: Secondary | ICD-10-CM

## 2023-08-02 DIAGNOSIS — M2550 Pain in unspecified joint: Secondary | ICD-10-CM | POA: Diagnosis not present

## 2023-08-02 DIAGNOSIS — M25561 Pain in right knee: Secondary | ICD-10-CM

## 2023-08-02 DIAGNOSIS — G8929 Other chronic pain: Secondary | ICD-10-CM

## 2023-08-02 DIAGNOSIS — M65961 Unspecified synovitis and tenosynovitis, right lower leg: Secondary | ICD-10-CM

## 2023-08-02 DIAGNOSIS — M19011 Primary osteoarthritis, right shoulder: Secondary | ICD-10-CM

## 2023-08-04 LAB — SEDIMENTATION RATE: Sed Rate: 2 mm/h (ref 0–15)

## 2023-08-04 LAB — CBC WITH DIFFERENTIAL/PLATELET
Absolute Lymphocytes: 1880 {cells}/uL (ref 850–3900)
Absolute Monocytes: 608 {cells}/uL (ref 200–950)
Basophils Absolute: 31 {cells}/uL (ref 0–200)
Basophils Relative: 0.4 %
Eosinophils Absolute: 31 {cells}/uL (ref 15–500)
Eosinophils Relative: 0.4 %
HCT: 45.9 % (ref 38.5–50.0)
Hemoglobin: 15.4 g/dL (ref 13.2–17.1)
MCH: 30.1 pg (ref 27.0–33.0)
MCHC: 33.6 g/dL (ref 32.0–36.0)
MCV: 89.8 fL (ref 80.0–100.0)
MPV: 10.3 fL (ref 7.5–12.5)
Monocytes Relative: 7.8 %
Neutro Abs: 5249 {cells}/uL (ref 1500–7800)
Neutrophils Relative %: 67.3 %
Platelets: 301 10*3/uL (ref 140–400)
RBC: 5.11 10*6/uL (ref 4.20–5.80)
RDW: 11.6 % (ref 11.0–15.0)
Total Lymphocyte: 24.1 %
WBC: 7.8 10*3/uL (ref 3.8–10.8)

## 2023-08-04 LAB — C-REACTIVE PROTEIN: CRP: <3 mg/L (ref <8.0)

## 2023-08-04 LAB — CYCLIC CITRUL PEPTIDE ANTIBODY, IGG: Cyclic Citrullin Peptide Ab: <16 U

## 2023-08-04 LAB — ANA: Anti Nuclear Antibody (ANA): NEGATIVE

## 2023-08-04 LAB — RHEUMATOID FACTOR: Rheumatoid fact SerPl-aCnc: <10 [IU]/mL (ref <14)

## 2023-08-06 ENCOUNTER — Encounter: Payer: Self-pay | Admitting: Surgical

## 2023-08-06 MED ORDER — BUPIVACAINE HCL 0.25 % IJ SOLN
0.6600 mL | INTRAMUSCULAR | Status: AC | PRN
  Administered 2023-08-02: .66 mL via INTRA_ARTICULAR

## 2023-08-06 MED ORDER — LIDOCAINE HCL 1 % IJ SOLN
3.0000 mL | INTRAMUSCULAR | Status: AC | PRN
  Administered 2023-08-02: 3 mL

## 2023-08-06 MED ORDER — METHYLPREDNISOLONE ACETATE 40 MG/ML IJ SUSP
40.0000 mg | INTRAMUSCULAR | Status: AC | PRN
  Administered 2023-08-02: 40 mg via INTRA_ARTICULAR

## 2023-08-06 MED ORDER — LIDOCAINE HCL 1 % IJ SOLN
5.0000 mL | INTRAMUSCULAR | Status: AC | PRN
  Administered 2023-08-02: 5 mL

## 2023-08-06 MED ORDER — BUPIVACAINE HCL 0.25 % IJ SOLN
4.0000 mL | INTRAMUSCULAR | Status: AC | PRN
  Administered 2023-08-02: 4 mL via INTRA_ARTICULAR

## 2023-08-06 MED ORDER — METHYLPREDNISOLONE ACETATE 40 MG/ML IJ SUSP
13.3300 mg | INTRAMUSCULAR | Status: AC | PRN
  Administered 2023-08-02: 13.33 mg via INTRA_ARTICULAR

## 2023-08-06 NOTE — Progress Notes (Signed)
   Procedure Note  Patient: Adam Vaughn             Date of Birth: [birth year]           MRN: [id number]             Visit Date: 08/02/2023  Procedures: Visit Diagnoses:  1. Chronic right shoulder pain   2. Multiple joint pain     Medium Joint Inj: R acromioclavicular on 08/02/2023 3:23 PM Indications: diagnostic evaluation and pain Details: 25 G 1.5 in needle, ultrasound-guided anterior approach Medications: 3 mL lidocaine 1 %; 0.66 mL bupivacaine 0.25 %; 13.33 mg methylPREDNISolone acetate 40 MG/ML Outcome: tolerated well, no immediate complications Procedure, treatment alternatives, risks and benefits explained, specific risks discussed. Consent was given by the patient. Immediately prior to procedure a time out was called to verify the correct patient, procedure, equipment, support staff and site/side marked as required. Patient was prepped and draped in the usual sterile fashion.    Large Joint Inj: R knee on 08/02/2023 3:23 PM Indications: diagnostic evaluation, joint swelling and pain Details: 18 G 1.5 in needle, superolateral approach  Arthrogram: No  Medications: 5 mL lidocaine 1 %; 40 mg methylPREDNISolone acetate 40 MG/ML; 4 mL bupivacaine 0.25 % Outcome: tolerated well, no immediate complications Procedure, treatment alternatives, risks and benefits explained, specific risks discussed. Consent was given by the patient. Immediately prior to procedure a time out was called to verify the correct patient, procedure, equipment, support staff and site/side marked as required. Patient was prepped and draped in the usual sterile fashion.    Patient is here for planned [facility] joint injection and would like right knee injection as well.  Both injections were administered and patient tolerated procedure well without complication.  He also reports history of rheumatoid arthritis in his father and with his multiple joint complaints, he would like to try and see if there are any  suggestions of rheumatoid arthritis for him with lab test.  These were found to be negative for any suggestion of rheumatoid arthritis.

## 2023-08-08 ENCOUNTER — Encounter: Payer: Self-pay | Admitting: Internal Medicine

## 2023-08-08 ENCOUNTER — Ambulatory Visit: Payer: Medicare HMO | Admitting: Internal Medicine

## 2023-08-08 VITALS — BP 120/80 | HR 84 | Temp 98.3°F | Ht 68.0 in | Wt 196.0 lb

## 2023-08-08 DIAGNOSIS — M545 Low back pain, unspecified: Secondary | ICD-10-CM

## 2023-08-08 DIAGNOSIS — M48062 Spinal stenosis, lumbar region with neurogenic claudication: Secondary | ICD-10-CM

## 2023-08-08 DIAGNOSIS — E559 Vitamin D deficiency, unspecified: Secondary | ICD-10-CM

## 2023-08-08 DIAGNOSIS — G8929 Other chronic pain: Secondary | ICD-10-CM

## 2023-08-08 DIAGNOSIS — R7989 Other specified abnormal findings of blood chemistry: Secondary | ICD-10-CM

## 2023-08-08 MED ORDER — OXYCODONE HCL 10 MG PO TABS: 10.0000 mg | ORAL_TABLET | Freq: Three times a day (TID) | ORAL | 0 refills | Status: DC | PRN

## 2023-08-08 NOTE — Assessment & Plan Note (Signed)
 On Oxy  Potential benefits of a long term opioids use as well as potential risks (i.e. addiction risk, apnea etc) and complications (i.e. Somnolence, constipation and others) were explained to the patient and were aknowledged. Epid inj is pending today Journavx (Suzetrigine) - will research

## 2023-08-08 NOTE — Assessment & Plan Note (Signed)
On Vit D+K Oxy Rx Pregabaline On Testosterone inj

## 2023-08-08 NOTE — Progress Notes (Signed)
 Subjective:  Patient ID: Adam Vaughn, male    DOB: 1976/03/16  Age: 48 y.o. MRN: [id number]  CC: Medical Management of Chronic Issues (2 mnth f/u. Discuss a sleep aid to help with sleep... Also discuss a medication for pain called Journavx (Suzetrigine) that can replace the opioids and help better with pts pain if provider agrees.)   HPI Adam Vaughn presents for LBP, gait disorder, GERD   Outpatient Medications Prior to Visit  Medication Sig Dispense Refill   Docusate Calcium  (STOOL SOFTENER PO) Take 1 tablet by mouth daily.     esomeprazole  (NEXIUM ) 40 MG capsule Take 1 capsule (40 mg total) by mouth 2 (two) times daily before a meal. 60 capsule 11   ezetimibe  (ZETIA ) 10 MG tablet Take 1 tablet (10 mg total) by mouth daily. 90 tablet 3   hyoscyamine  (LEVSIN  SL) 0.125 MG SL tablet Take 1 tablet (0.125 mg total) by mouth every 6 (six) hours as needed. 30 tablet 2   methylcellulose (CITRUCEL) oral powder Take 1 packet by mouth daily.     NEEDLE, DISP, 23 G (BD DISP NEEDLE) 23G X 1 MISC Use for intramuscular injections. 50 each 3   NEEDLE, DISP, 25 G (B-D DISP NEEDLE 25GX1) 25G X 1 MISC Use every two weeks to inject Testosterone  medication. 50 each 3   ondansetron  (ZOFRAN ) 4 MG tablet Take 1 tablet (4 mg total) by mouth every 8 (eight) hours as needed. 30 tablet 3   Plecanatide  (TRULANCE ) 3 MG TABS Take 1 tablet (3 mg total) by mouth daily. For constipation 30 tablet 11   pregabalin  (LYRICA ) 150 MG capsule Take 1 capsule (150 mg total) by mouth 2 (two) times daily. 60 capsule 5   rosuvastatin  (CRESTOR ) 40 MG tablet Take 40 mg by mouth daily.     tadalafil  (CIALIS ) 5 MG tablet Take 5 mg by mouth daily.     tamsulosin  (FLOMAX ) 0.4 MG CAPS capsule Take 1 capsule (0.4 mg total) by mouth daily. 90 capsule 3   testosterone  cypionate (DEPOTESTOSTERONE CYPIONATE) 200 MG/ML injection Use 200 mg IM every 10 days 10 mL 1   tiZANidine  (ZANAFLEX ) 4 MG tablet Take 1 tablet (4 mg total) by  mouth 3 (three) times daily. 180 tablet 0   triamcinolone  cream (KENALOG ) 0.5 % Apply 1 Application topically 4 (four) times daily. 45 g 1   Vitamin D -Vitamin K (K2 PLUS D3) [phone] MCG-UNIT TABS 1 po daily 100 tablet 3   Oxycodone  HCl 10 MG TABS Take 1 tablet (10 mg total) by mouth 3 (three) times daily as needed. 90 tablet 0   Oxycodone  HCl 10 MG TABS Take 1 tablet (10 mg total) by mouth 3 (three) times daily as needed. 90 tablet 0   albuterol  (PROAIR  HFA) 108 (90 Base) MCG/ACT inhaler Inhale 2 puffs into the lungs every 6 (six) hours as needed for wheezing or shortness of breath. 3 each 1   fluticasone  (FLONASE ) 50 MCG/ACT nasal spray Place 2 sprays into both nostrils daily. (Patient not taking: Reported on 08/08/2023) 16 g 6   No facility-administered medications prior to visit.    ROS: Review of Systems  Constitutional:  Negative for appetite change, fatigue and unexpected weight change.  HENT:  Negative for congestion, nosebleeds, sneezing, sore throat and trouble swallowing.   Eyes:  Negative for itching and visual disturbance.  Respiratory:  Negative for cough.   Cardiovascular:  Negative for chest pain, palpitations and leg swelling.  Gastrointestinal:  Negative for abdominal  distention, blood in stool, diarrhea and nausea.  Genitourinary:  Negative for frequency and hematuria.  Musculoskeletal:  Positive for arthralgias, back pain and gait problem. Negative for joint swelling and neck pain.  Skin:  Negative for rash.  Neurological:  Negative for dizziness, tremors, speech difficulty and weakness.  Psychiatric/Behavioral:  Negative for agitation, dysphoric mood, sleep disturbance and suicidal ideas. The patient is not nervous/anxious.     Objective:  BP 120/80 (BP Location: Left Arm, Patient Position: Sitting, Cuff Size: Normal)   Pulse 84   Temp 98.3 F (36.8 C) (Oral)   Ht 5' 8 (1.727 m)   Wt 196 lb (88.9 kg)   SpO2 97%   BMI 29.80 kg/m   BP Readings from Last 3  Encounters:  08/08/23 120/80  07/15/23 132/70  06/17/23 120/80    Wt Readings from Last 3 Encounters:  08/08/23 196 lb (88.9 kg)  07/15/23 198 lb 6.4 oz (90 kg)  06/17/23 198 lb (89.8 kg)    Physical Exam Constitutional:      General: He is not in acute distress.    Appearance: He is well-developed. He is obese.     Comments: NAD  Eyes:     Conjunctiva/sclera: Conjunctivae normal.     Pupils: Pupils are equal, round, and reactive to light.  Neck:     Thyroid : No thyromegaly.     Vascular: No JVD.  Cardiovascular:     Rate and Rhythm: Normal rate and regular rhythm.     Heart sounds: Normal heart sounds. No murmur heard.    No friction rub. No gallop.  Pulmonary:     Effort: Pulmonary effort is normal. No respiratory distress.     Breath sounds: Normal breath sounds. No wheezing or rales.  Chest:     Chest wall: No tenderness.  Abdominal:     General: Bowel sounds are normal. There is no distension.     Palpations: Abdomen is soft. There is no mass.     Tenderness: There is no abdominal tenderness. There is no guarding or rebound.  Musculoskeletal:        General: No tenderness. Normal range of motion.     Cervical back: Normal range of motion.     Right lower leg: No edema.     Left lower leg: No edema.  Lymphadenopathy:     Cervical: No cervical adenopathy.  Skin:    General: Skin is warm and dry.     Findings: No rash.  Neurological:     Mental Status: He is alert and oriented to person, place, and time.     Cranial Nerves: No cranial nerve deficit.     Motor: No abnormal muscle tone.     Coordination: Coordination normal.     Gait: Gait normal.     Deep Tendon Reflexes: Reflexes are normal and symmetric.  Psychiatric:        Behavior: Behavior normal.        Thought Content: Thought content normal.        Judgment: Judgment normal.     Lab Results  Component Value Date   WBC 7.8 08/02/2023   HGB 15.4 08/02/2023   HCT 45.9 08/02/2023   PLT 301  08/02/2023   GLUCOSE 90 06/17/2023   CHOL 124 07/25/2023   TRIG 63 07/25/2023   HDL 58 07/25/2023   LDLCALC 53 07/25/2023   ALT 55 (H) 06/17/2023   AST 27 06/17/2023   NA 140 06/17/2023   K 3.8 06/17/2023  CL 107 06/17/2023   CREATININE 0.93 06/17/2023   BUN 12 06/17/2023   CO2 26 06/17/2023   TSH 0.84 06/17/2023   PSA 1.83 09/04/2021   HGBA1C 5.5 09/04/2021    MR Knee Left  Wo Contrast Result Date: 06/12/2023 CLINICAL DATA:  Chronic bilateral knee pain EXAM: MRI OF THE LEFT KNEE WITHOUT CONTRAST TECHNIQUE: Multiplanar, multisequence MR imaging of the knee was performed. No intravenous contrast was administered. COMPARISON:  None Available. FINDINGS: MENISCI Medial: Oblique tear of the posterior horn-body junction of the medial meniscus extending to the inferior articular surface and extending into the posterior horn. Lateral: Intact. LIGAMENTS Cruciates: ACL and PCL are intact. Collaterals: Medial collateral ligament is intact. Lateral collateral ligament complex is intact. CARTILAGE Patellofemoral: Partial-thickness cartilage loss of the medial patellar facet with cartilage fissuring and subchondral marrow edema. Medial: Mild partial-thickness cartilage loss of the weight-bearing medial femorotibial compartment. Lateral:  No chondral defect. JOINT: No joint effusion. Normal Hoffa's fat-pad. No plical thickening. POPLITEAL FOSSA: Popliteus tendon is intact. No Baker's cyst. EXTENSOR MECHANISM: Intact quadriceps tendon. Mild proximal patellar tendinosis. Intact lateral patellar retinaculum. Intact medial patellar retinaculum. Intact MPFL. BONES: No aggressive osseous lesion. No fracture or dislocation. Other: No fluid collection or hematoma. Muscles are normal. IMPRESSION: 1. Oblique tear of the posterior horn-body junction of the medial meniscus extending to the inferior articular surface and extending into the posterior horn. 2. Partial-thickness cartilage loss of the medial patellar facet  with cartilage fissuring and subchondral marrow edema. 3. Mild partial-thickness cartilage loss of the weight-bearing medial femorotibial compartment. Electronically Signed   By: Julaine Blanch M.D.   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 addiction risk, apnea etc) and complications (i.e. Somnolence, constipation and others) were explained to the patient and were aknowledged. Epid inj is pending today Journavx [employer]) - will research      Relevant Medications   Oxycodone  HCl 10 MG TABS   Oxycodone  HCl 10 MG TABS   Low vitamin B12 level   On B complex         Meds ordered this encounter  Medications   Oxycodone  HCl 10 MG TABS    Sig: Take 1 tablet (10 mg total) by mouth 3 (three) times daily as needed.    Dispense:  90 tablet    Refill:  0    Please fill on or after 08/13/23   Oxycodone  HCl 10 MG TABS    Sig: Take 1 tablet (10 mg total) by mouth 3 (three) times daily as needed.    Dispense:  90 tablet    Refill:  0    Please fill on or after 09/12/23      Follow-up: Return in about 2 months (around 10/06/2023) for a follow-up visit.  Marolyn Noel, MD

## 2023-08-08 NOTE — Assessment & Plan Note (Signed)
 On Vit D

## 2023-08-08 NOTE — Assessment & Plan Note (Signed)
On B complex 

## 2023-08-21 ENCOUNTER — Encounter: Payer: Self-pay | Admitting: Podiatry

## 2023-08-21 ENCOUNTER — Ambulatory Visit: Payer: Medicare HMO | Admitting: Podiatry

## 2023-08-21 ENCOUNTER — Ambulatory Visit (INDEPENDENT_AMBULATORY_CARE_PROVIDER_SITE_OTHER): Payer: Medicare HMO

## 2023-08-21 DIAGNOSIS — M722 Plantar fascial fibromatosis: Secondary | ICD-10-CM | POA: Diagnosis not present

## 2023-08-22 ENCOUNTER — Ambulatory Visit: Payer: Medicare HMO | Admitting: Internal Medicine

## 2023-08-22 NOTE — Progress Notes (Signed)
Subjective:   Patient ID: Adam Vaughn, male   DOB: 48 y.o.   MRN: [id number]   HPI Patient presents stating that he is getting a lot of pain around his big toe joint left that is simply not responding anymore to injections.  Stated he had about 4 weeks relief with quick reoccurrence of pain and also has a bone spur bump on top of the foot that is becoming more tender and is making shoe gear difficult   ROS      Objective:  Physical Exam  Neuro vascular status intact with the patient's left first MPJ very inflamed and sore.  Moderate reduction motion of the joint no crepitus and hard for him to bear weight down on it.  Also noted to have a large spur on the medial cuneiform at the first metatarsal cuneiform joint that is becoming more tender and is red     Assessment:  Hallux limitus deformity left with inflammation of the joint surface and pain with the beginnings of bone spur formation along with tarsal exostosis left     Plan:  H&P reviewed at great length.  Injections are not successful for him anymore he has tried different shoe gear he has tried anti-inflammatories and at this point I do think a biplanar osteotomy to lower the first metatarsal remove bone spurs and also tarsal exostectomy would be in his best interest.  He wants surgery and at this point I allowed him to read consent form for correction and explained to him alternative treatments complications with surgery and the fact there is no guarantee this will solve the problem and the fact there may be cartilage damage and ultimately he could require fusion joint implantation procedures.  He understands all this wants surgery after extensive review signed consent form scheduled outpatient surgery.  I did dispense a short air fracture walker properly fitted to his lower leg that I want him to get used to prior to surgery and bring with him to the surgical center  X-rays indicate elevation of the first MPJ with elevation of  the ankle also about 15 degrees 1 to IM and tarsal exostosis noted on x-ray

## 2023-08-24 ENCOUNTER — Encounter: Payer: Self-pay | Admitting: Orthopedic Surgery

## 2023-08-26 NOTE — Telephone Encounter (Signed)
Which knee

## 2023-08-29 ENCOUNTER — Ambulatory Visit: Payer: Medicare HMO | Admitting: Physical Medicine and Rehabilitation

## 2023-08-29 ENCOUNTER — Other Ambulatory Visit: Payer: Self-pay

## 2023-08-29 VITALS — BP 142/87 | HR 73

## 2023-08-29 DIAGNOSIS — G894 Chronic pain syndrome: Secondary | ICD-10-CM

## 2023-08-29 DIAGNOSIS — M5416 Radiculopathy, lumbar region: Secondary | ICD-10-CM | POA: Diagnosis not present

## 2023-08-29 DIAGNOSIS — Z981 Arthrodesis status: Secondary | ICD-10-CM

## 2023-08-29 MED ORDER — METHYLPREDNISOLONE ACETATE 40 MG/ML IJ SUSP
40.0000 mg | Freq: Once | INTRAMUSCULAR | Status: AC
  Administered 2023-08-29: 40 mg

## 2023-08-29 NOTE — Patient Instructions (Signed)

## 2023-08-29 NOTE — Progress Notes (Signed)
 Pain Score----7 No Blood Thinners No allergies to Contrast Dyes

## 2023-08-29 NOTE — Procedures (Signed)
 Lumbosacral Transforaminal Epidural Steroid Injection - Sub-Pedicular Approach with Fluoroscopic Guidance  Patient: Adam Vaughn      Date of Birth: Oct 30, 1975 MRN: [id number] PCP: Tresa Garter, MD      Visit Date: 08/29/2023   Universal Protocol:    Date/Time: 08/29/2023  Consent Given By: the patient  Position: PRONE  Additional Comments: Vital signs were monitored before and after the procedure. Patient was prepped and draped in the usual sterile fashion. The correct patient, procedure, and site was verified.   Injection Procedure Details:   Procedure diagnoses: Lumbar radiculopathy [M54.16]    Meds Administered:  Meds ordered this encounter  Medications   methylPREDNISolone acetate (DEPO-MEDROL) injection 40 mg    Laterality: Bilateral  Location/Site: L3  Needle:5.0 in., 22 ga.  Short bevel or Quincke spinal needle  Needle Placement: Transforaminal  Findings:    -Comments: Excellent flow of contrast along the nerve, nerve root and into the epidural space.  Procedure Details: After squaring off the end-plates to get a true AP view, the C-arm was positioned so that an oblique view of the foramen as noted above was visualized. The target area is just inferior to the "nose of the scotty dog" or sub pedicular. The soft tissues overlying this structure were infiltrated with 2-3 ml. of 1% Lidocaine without Epinephrine.  The spinal needle was inserted toward the target using a "trajectory" view along the fluoroscope beam.  Under AP and lateral visualization, the needle was advanced so it did not puncture dura and was located close the 6 O'Clock position of the pedical in AP tracterory. Biplanar projections were used to confirm position. Aspiration was confirmed to be negative for CSF and/or blood. A 1-2 ml. volume of Isovue-250 was injected and flow of contrast was noted at each level. Radiographs were obtained for documentation purposes.   After attaining the  desired flow of contrast documented above, a 0.5 to 1.0 ml test dose of 0.25% Marcaine was injected into each respective transforaminal space.  The patient was observed for 90 seconds post injection.  After no sensory deficits were reported, and normal lower extremity motor function was noted,   the above injectate was administered so that equal amounts of the injectate were placed at each foramen (level) into the transforaminal epidural space.   Additional Comments:  The patient tolerated the procedure well Dressing: 2 x 2 sterile gauze and Band-Aid    Post-procedure details: Patient was observed during the procedure. Post-procedure instructions were reviewed.  Patient left the clinic in stable condition.

## 2023-08-29 NOTE — Progress Notes (Signed)
 GLEN KESINGER - 48 y.o. male MRN [id number]  Date of birth: 09-Jan-1976  Office Visit Note: Visit Date: 08/29/2023 PCP: Tresa Garter, MD Referred by: London Sheer, MD  Subjective: Chief Complaint  Patient presents with   Lower Back - Pain   HPI:  ADEEB KONECNY is a 48 y.o. male who comes in today for planned repeat Bilateral L3-4  Lumbar Transforaminal epidural steroid injection with fluoroscopic guidance.  The patient has failed conservative care including home exercise, medications, time and activity modification.  This injection will be diagnostic and hopefully therapeutic.  Please see requesting physician notes for further details and justification. Patient received more than 50% pain relief from prior injection.   Referring: Dr. Willia Craze   ROS Otherwise per HPI.  Assessment & Plan: Visit Diagnoses:    ICD-10-CM   1. Lumbar radiculopathy  M54.16 XR C-ARM NO REPORT    Epidural Steroid injection    methylPREDNISolone acetate (DEPO-MEDROL) injection 40 mg    2. S/P lumbar spinal fusion  Z98.1     3. Chronic pain syndrome  G89.4       Plan: No additional findings.   Meds & Orders:  Meds ordered this encounter  Medications   methylPREDNISolone acetate (DEPO-MEDROL) injection 40 mg    Orders Placed This Encounter  Procedures   XR C-ARM NO REPORT   Epidural Steroid injection    Follow-up: Return for visit to requesting provider as needed.   Procedures: No procedures performed  Lumbosacral Transforaminal Epidural Steroid Injection - Sub-Pedicular Approach with Fluoroscopic Guidance  Patient: DARIS HARKINS      Date of Birth: October 26, 1975 MRN: [id number] PCP: Tresa Garter, MD      Visit Date: 08/29/2023   Universal Protocol:    Date/Time: 08/29/2023  Consent Given By: the patient  Position: PRONE  Additional Comments: Vital signs were monitored before and after the procedure. Patient was prepped and draped in the usual  sterile fashion. The correct patient, procedure, and site was verified.   Injection Procedure Details:   Procedure diagnoses: Lumbar radiculopathy [M54.16]    Meds Administered:  Meds ordered this encounter  Medications   methylPREDNISolone acetate (DEPO-MEDROL) injection 40 mg    Laterality: Bilateral  Location/Site: L3  Needle:5.0 in., 22 ga.  Short bevel or Quincke spinal needle  Needle Placement: Transforaminal  Findings:    -Comments: Excellent flow of contrast along the nerve, nerve root and into the epidural space.  Procedure Details: After squaring off the end-plates to get a true AP view, the C-arm was positioned so that an oblique view of the foramen as noted above was visualized. The target area is just inferior to the "nose of the scotty dog" or sub pedicular. The soft tissues overlying this structure were infiltrated with 2-3 ml. of 1% Lidocaine without Epinephrine.  The spinal needle was inserted toward the target using a "trajectory" view along the fluoroscope beam.  Under AP and lateral visualization, the needle was advanced so it did not puncture dura and was located close the 6 O'Clock position of the pedical in AP tracterory. Biplanar projections were used to confirm position. Aspiration was confirmed to be negative for CSF and/or blood. A 1-2 ml. volume of Isovue-250 was injected and flow of contrast was noted at each level. Radiographs were obtained for documentation purposes.   After attaining the desired flow of contrast documented above, a 0.5 to 1.0 ml test dose of 0.25% Marcaine was injected into each respective  transforaminal space.  The patient was observed for 90 seconds post injection.  After no sensory deficits were reported, and normal lower extremity motor function was noted,   the above injectate was administered so that equal amounts of the injectate were placed at each foramen (level) into the transforaminal epidural space.   Additional Comments:   The patient tolerated the procedure well Dressing: 2 x 2 sterile gauze and Band-Aid    Post-procedure details: Patient was observed during the procedure. Post-procedure instructions were reviewed.  Patient left the clinic in stable condition.    Clinical History: EXAM: MRI LUMBAR SPINE WITHOUT CONTRAST   TECHNIQUE: Multiplanar, multisequence MR imaging of the lumbar spine was performed. No intravenous contrast was administered.   COMPARISON:  01/18/2021   FINDINGS: Segmentation:  Standard.   Alignment:  2 mm retrolisthesis of L3 on L4.   Vertebrae: No acute fracture, evidence of discitis, or aggressive bone lesion. 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   Conus medullaris and cauda equina: Conus extends to the T12-L1 level. Conus and cauda equina appear normal.   Paraspinal and other soft tissues: No acute paraspinal abnormality. Postsurgical changes in the posterior paraspinal soft tissues at L4-S1.   Disc levels:   Disc spaces: Posterior lumbar interbody fusion from L4 through S1.   T12-L1: No significant disc bulge. No neural foraminal stenosis. No central canal stenosis.   L1-L2: No significant disc bulge. No neural foraminal stenosis. No central canal stenosis.   L2-L3: Minimal broad-based disc bulge. No left foraminal stenosis. Mild right foraminal stenosis. No spinal stenosis.   L3-L4: Broad-based disc bulge flattening ventral thecal sac. Mild bilateral facet arthropathy. Mild bilateral foraminal stenosis. Mild spinal stenosis. Bilateral lateral recess stenosis.   L4-L5: Interbody fusion.  No foraminal or central canal stenosis.   L5-S1: Interbody fusion.  No foraminal or central canal stenosis.   IMPRESSION: 1. At L3-4 there is a broad-based disc bulge flattening ventral thecal sac. Mild bilateral facet arthropathy. Mild bilateral foraminal stenosis. Mild spinal stenosis. Bilateral lateral recess stenosis. 2. Posterior lumbar  interbody fusion from L4 through S1 without foraminal or central canal stenosis. 3. No acute osseous injury of the lumbar spine.     Electronically Signed   By: Elige Ko M.D.   On: 05/22/2022 10:20     Objective:  VS:  HT:    WT:   BMI:     BP:(!) 142/87  HR:73bpm  TEMP: ( )  RESP:  Physical Exam Vitals and nursing note reviewed.  Constitutional:      General: He is not in acute distress.    Appearance: Normal appearance. He is not ill-appearing.  HENT:     Head: Normocephalic and atraumatic.     Right Ear: External ear normal.     Left Ear: External ear normal.     Nose: No congestion.  Eyes:     Extraocular Movements: Extraocular movements intact.  Cardiovascular:     Rate and Rhythm: Normal rate.     Pulses: Normal pulses.  Pulmonary:     Effort: Pulmonary effort is normal. No respiratory distress.  Abdominal:     General: There is no distension.     Palpations: Abdomen is soft.  Musculoskeletal:        General: No tenderness or signs of injury.     Cervical back: Neck supple.     Right lower leg: No edema.     Left lower leg: No edema.  Comments: Patient has good distal strength without clonus.  Skin:    Findings: No erythema or rash.  Neurological:     General: No focal deficit present.     Mental Status: He is alert and oriented to person, place, and time.     Sensory: No sensory deficit.     Motor: No weakness or abnormal muscle tone.     Coordination: Coordination normal.  Psychiatric:        Mood and Affect: Mood normal.        Behavior: Behavior normal.      Imaging: No results found.

## 2023-09-06 NOTE — Telephone Encounter (Signed)
 Sure thx

## 2023-09-09 ENCOUNTER — Other Ambulatory Visit: Payer: Self-pay

## 2023-09-09 DIAGNOSIS — M25562 Pain in left knee: Secondary | ICD-10-CM

## 2023-09-10 ENCOUNTER — Other Ambulatory Visit: Payer: Self-pay | Admitting: Orthopedic Surgery

## 2023-09-13 ENCOUNTER — Ambulatory Visit: Payer: Medicare HMO

## 2023-09-13 VITALS — Ht 68.0 in | Wt 196.0 lb

## 2023-09-13 DIAGNOSIS — Z0000 Encounter for general adult medical examination without abnormal findings: Secondary | ICD-10-CM | POA: Diagnosis not present

## 2023-09-13 NOTE — Progress Notes (Addendum)
 Subjective:   Adam Vaughn is a 48 y.o. who presents for a Medicare Wellness preventive visit.  Visit Complete: Virtual I connected with  Adam Vaughn on 09/13/23 by a audio enabled telemedicine application and verified that I am speaking with the correct person using two identifiers.  Patient Location: Home  Provider Location: Office/Clinic  I discussed the limitations of evaluation and management by telemedicine. The patient expressed understanding and agreed to proceed.  Vital Signs: Because this visit was a virtual/telehealth visit, some criteria may be missing or patient reported. Any vitals not documented were not able to be obtained and vitals that have been documented are patient reported.  VideoDeclined- This patient declined Librarian, academic. Therefore the visit was completed with audio only.  Persons Participating in Visit: Patient.  AWV Questionnaire: No: Patient Medicare AWV questionnaire was not completed prior to this visit.  Cardiac Risk Factors include: advanced age (>45men, >39 women);dyslipidemia;male gender     Objective:    Today's Vitals   09/13/23 0925 09/13/23 0927  Weight: 196 lb (88.9 kg)   Height: 5\' 8"  (1.727 m)   PainSc: 8  8   PainLoc: Back    Body mass index is 29.8 kg/m.     09/13/2023    9:23 AM 11/19/2022    2:12 PM 10/12/2021   10:12 AM 09/30/2020    7:45 PM 09/30/2020    4:25 PM 09/14/2020   10:14 AM 04/07/2020   12:05 PM  Advanced Directives  Does Patient Have a Medical Advance Directive? No No No No No No No  Would patient like information on creating a medical advance directive? Yes (MAU/Ambulatory/Procedural Areas - Information given) No - Patient declined No - Patient declined No - Patient declined No - Patient declined Yes (MAU/Ambulatory/Procedural Areas - Information given) No - Patient declined    Current Medications (verified) Outpatient Encounter Medications as of 09/13/2023   Medication Sig   Docusate Calcium (STOOL SOFTENER PO) Take 1 tablet by mouth daily.   esomeprazole (NEXIUM) 40 MG capsule Take 1 capsule (40 mg total) by mouth 2 (two) times daily before a meal.   ezetimibe (ZETIA) 10 MG tablet Take 1 tablet (10 mg total) by mouth daily.   fluticasone (FLONASE) 50 MCG/ACT nasal spray Place 2 sprays into both nostrils daily.   hyoscyamine (LEVSIN SL) 0.125 MG SL tablet Take 1 tablet (0.125 mg total) by mouth every 6 (six) hours as needed.   methylcellulose (CITRUCEL) oral powder Take 1 packet by mouth daily.   NEEDLE, DISP, 23 G (BD DISP NEEDLE) 23G X 1" MISC Use for intramuscular injections.   NEEDLE, DISP, 25 G (B-D DISP NEEDLE 25GX1") 25G X 1" MISC Use every two weeks to inject Testosterone medication.   ondansetron (ZOFRAN) 4 MG tablet Take 1 tablet (4 mg total) by mouth every 8 (eight) hours as needed.   Oxycodone HCl 10 MG TABS Take 1 tablet (10 mg total) by mouth 3 (three) times daily as needed.   pregabalin (LYRICA) 150 MG capsule Take 1 capsule (150 mg total) by mouth 2 (two) times daily.   rosuvastatin (CRESTOR) 40 MG tablet Take 40 mg by mouth daily.   tadalafil (CIALIS) 5 MG tablet Take 5 mg by mouth daily.   tamsulosin (FLOMAX) 0.4 MG CAPS capsule Take 1 capsule (0.4 mg total) by mouth daily.   testosterone cypionate (DEPOTESTOSTERONE CYPIONATE) 200 MG/ML injection Use 200 mg IM every 10 days   tiZANidine (ZANAFLEX) 4 MG tablet  Take 1 tablet (4 mg total) by mouth 3 (three) times daily.   triamcinolone cream (KENALOG) 0.5 % Apply 1 Application topically 4 (four) times daily.   Vitamin D-Vitamin K (K2 PLUS D3) [phone] MCG-UNIT TABS 1 po daily   [DISCONTINUED] Oxycodone HCl 10 MG TABS Take 1 tablet (10 mg total) by mouth 3 (three) times daily as needed.   [DISCONTINUED] Plecanatide (TRULANCE) 3 MG TABS Take 1 tablet (3 mg total) by mouth daily. For constipation   No facility-administered encounter medications on file as of 09/13/2023.    Allergies  (verified) Cymbalta [duloxetine hcl] and Gabapentin   History: Past Medical History:  Diagnosis Date   Arthritis    Chronic back pain    Family history of colon cancer    sister   GERD (gastroesophageal reflux disease)    History of kidney stones    Hyperlipidemia    Hypertension    Past Surgical History:  Procedure Laterality Date   BACK SURGERY  04/11/2020   CHOLECYSTECTOMY N/A 10/01/2020   Procedure: LAPAROSCOPIC CHOLECYSTECTOMY;  Surgeon: Griselda Miner, MD;  Location: [facility];  Service: General;  Laterality: N/A;   COLONOSCOPY  1991   IBS   endocolon  09/15/2021   [facility], GERD   HAND SURGERY Bilateral    SHOULDER SURGERY Right 08/2021   SPINAL CORD STIMULATOR IMPLANT  2022   SPINAL CORD STIMULATOR REMOVAL  2022   TONSILLECTOMY     VASECTOMY     Family History  Problem Relation Age of Onset   Heart disease Father 10       MI   Hypertension Father    Heart attack Father    Other Father        Small intestine removed but does not know why   Diabetes Sister    Heart disease Sister 62       MI   Colon cancer Sister    Colon polyps Sister    Colon polyps Sister    Healthy Sister    Healthy Sister    Healthy Sister    Other Brother        MVA   Healthy Brother    Healthy Brother    Healthy Brother    Healthy Brother    Healthy Brother    Healthy Brother    Liver disease Neg Hx    Pancreatic cancer Neg Hx    Esophageal cancer Neg Hx    Stomach cancer Neg Hx    Social History   Socioeconomic History   Marital status: Married    Spouse name: Not on file   Number of children: 2   Years of education: Not on file   Highest education level: GED or equivalent  Occupational History   Not on file  Tobacco Use   Smoking status: Never    Passive exposure: Never   Smokeless tobacco: Never  Vaping Use   Vaping status: Never Used  Substance and Sexual Activity   Alcohol use: Not Currently    Comment: States quit around age 55   Drug use: Never   Sexual  activity: Not Currently    Birth control/protection: Surgical    Comment: vastectomy  Other Topics Concern   Not on file  Social History Narrative   Married   Social Drivers of Health   Financial Resource Strain: Low Risk  (09/13/2023)   Overall Financial Resource Strain (CARDIA)    Difficulty of Paying Living Expenses: Not hard at all  Recent  Concern: Financial Resource Strain - Medium Risk (06/17/2023)   Overall Financial Resource Strain (CARDIA)    Difficulty of Paying Living Expenses: Somewhat hard  Food Insecurity: No Food Insecurity (09/13/2023)   Hunger Vital Sign    Worried About Running Out of Food in the Last Year: Never true    Ran Out of Food in the Last Year: Never true  Transportation Needs: No Transportation Needs (09/13/2023)   PRAPARE - Administrator, Civil Service (Medical): No    Lack of Transportation (Non-Medical): No  Physical Activity: Sufficiently Active (09/13/2023)   Exercise Vital Sign    Days of Exercise per Week: 7 days    Minutes of Exercise per Session: 40 min  Recent Concern: Physical Activity - Insufficiently Active (06/17/2023)   Exercise Vital Sign    Days of Exercise per Week: 3 days    Minutes of Exercise per Session: 10 min  Stress: Stress Concern Present (09/13/2023)   [employer] of Occupational Health - Occupational Stress Questionnaire    Feeling of Stress : Very much  Social Connections: Moderately Isolated (09/13/2023)   Social Connection and Isolation Panel [NHANES]    Frequency of Communication with Friends and Family: More than three times a week    Frequency of Social Gatherings with Friends and Family: Never    Attends Religious Services: Never    Database administrator or Organizations: No    Attends Engineer, structural: Never    Marital Status: Married    Tobacco Counseling - Non Smoker Counseling given: No    Clinical Intake:  Pre-visit preparation completed: Yes  Pain : 0-10 Pain Score: 8   Pain Type: Chronic pain Pain Location: Knee Pain Orientation: Right, Left, Lower Pain Descriptors / Indicators: Constant Pain Onset: 1 to 4 weeks ago Pain Frequency: Intermittent Pain Relieving Factors: Oxycodone Effect of Pain on Daily Activities: stretching/bending & used heating pad  Pain Relieving Factors: Oxycodone  BMI - recorded: 29.8 Nutritional Status: BMI 25 -29 Overweight Nutritional Risks: None Diabetes: No  How often do you need to have someone help you when you read instructions, pamphlets, or other written materials from your doctor or pharmacy?: 1 - Never  Interpreter Needed?: No  Information entered by :: Hassell Halim, CMA   Activities of Daily Living     09/13/2023    9:29 AM  In your present state of health, do you have any difficulty performing the following activities:  Hearing? 0  Vision? 0  Difficulty concentrating or making decisions? 0  Walking or climbing stairs? 1  Comment back and knee pain  Dressing or bathing? 0  Doing errands, shopping? 0  Preparing Food and eating ? N  Using the Toilet? N  In the past six months, have you accidently leaked urine? N  Do you have problems with loss of bowel control? N  Managing your Medications? N  Managing your Finances? N  Housekeeping or managing your Housekeeping? N    Patient Care Team: Plotnikov, Georgina Quint, MD as PCP - General (Internal Medicine) Jens Som Madolyn Frieze, MD as PCP - Cardiology (Cardiology) Julio Sicks, MD as Consulting Physician (Neurosurgery) Renaldo Fiddler, MD as Consulting Physician (Pain Medicine) Rachael Fee, MD (Inactive) as Attending Physician (Gastroenterology)  Indicate any recent Medical Services you may have received from other than [facility] providers in the past year (date may be approximate).     Assessment:   This is a routine wellness examination for Adam Vaughn.  Hearing/Vision  screen Hearing Screening - Comments:: Denies hearing difficulties   Vision Screening -  Comments:: Wears rx glasses - up to date with routine eye exams with an Ophthalmologist in [place], [place]   Goals Addressed               This Visit's Progress     Patient Stated (pt-stated)        Patient stated he wants to feel better (knee/back pain).  Plans to continue to exercising.       Depression Screen     09/13/2023    9:33 AM 08/08/2023    1:38 PM 06/17/2023    3:11 PM 04/15/2023   10:04 AM 01/08/2023   10:41 AM 12/10/2022   10:07 AM 11/12/2022    9:08 AM  PHQ 2/9 Scores  PHQ - 2 Score 2 0 0 0 0 2 0  PHQ- 9 Score 10     11     Fall Risk     09/13/2023    9:36 AM 08/08/2023    1:38 PM 06/17/2023    3:11 PM 04/15/2023   10:04 AM 01/08/2023   10:41 AM  Fall Risk   Falls in the past year? 1 0 0 0 0  Number falls in past yr: 1 0 0 0 0  Comment 6      Injury with Fall? 0 0 0 0 0  Risk for fall due to : Impaired balance/gait No Fall Risks No Fall Risks No Fall Risks No Fall Risks  Risk for fall due to: Comment due to back/knee pain      Follow up Falls prevention discussed;Falls evaluation completed;Education provided;Follow up appointment Falls evaluation completed Falls evaluation completed Falls evaluation completed Falls evaluation completed  Comment appt on 10/07/23 w/PCP        MEDICARE RISK AT HOME:  Medicare Risk at Home Any stairs in or around the home?: Yes (pt does not use) If so, are there any without handrails?: No Home free of loose throw rugs in walkways, pet beds, electrical cords, etc?: Yes Adequate lighting in your home to reduce risk of falls?: Yes Life alert?: No Use of a cane, walker or w/c?: Yes (cane) Grab bars in the bathroom?: Yes Shower chair or bench in shower?: No Elevated toilet seat or a handicapped toilet?: Yes  TIMED UP AND GO:  Was the test performed?  No  Cognitive Function: 6CIT completed        09/13/2023    9:40 AM  6CIT Screen  What Year? 0 points  What month? 0 points  What time? 0 points  Count back from 20 0  points  Months in reverse 0 points  Repeat phrase 0 points  Total Score 0 points    Immunizations  There is no immunization history on file for this patient.  Screening Tests Health Maintenance  Topic Date Due   Pneumococcal Vaccine 87-53 Years old (1 of 2 - PCV) Never done   DTaP/Tdap/Td (1 - Tdap) Never done   Medicare Annual Wellness (AWV)  09/12/2024   Colonoscopy  09/16/2026   Hepatitis C Screening  Completed   HIV Screening  Completed   HPV VACCINES  Aged Out   INFLUENZA VACCINE  Discontinued   COVID-19 Vaccine  Discontinued    Health Maintenance  Health Maintenance Due  Topic Date Due   Pneumococcal Vaccine 24-63 Years old (1 of 2 - PCV) Never done   DTaP/Tdap/Td (1 - Tdap) Never done   Health Maintenance  Items Addressed: 09/13/2023   Additional Screening:  Vision Screening: Recommended annual ophthalmology exams for early detection of glaucoma and other disorders of the eye.  Pt stated he sees an Ophthalmologist in [place], [place].  Dental Screening: Recommended annual dental exams for proper oral hygiene  Community Resource Referral / Chronic Care Management: CRR required this visit?  No   CCM required this visit?  No     Plan:     I have personally reviewed and noted the following in the patient's chart:   Medical and social history Use of alcohol, tobacco or illicit drugs  Current medications and supplements including opioid prescriptions. Patient is currently taking opioid prescriptions. Information provided to patient regarding non-opioid alternatives. Patient advised to discuss non-opioid treatment plan with their provider. Functional ability and status Nutritional status Physical activity Advanced directives List of other physicians Hospitalizations, surgeries, and ER visits in previous 12 months Vitals Screenings to include cognitive, depression, and falls Referrals and appointments  In addition, I have reviewed and discussed with patient  certain preventive protocols, quality metrics, and best practice recommendations. A written personalized care plan for preventive services as well as general preventive health recommendations were provided to patient.     Darreld Mclean, CMA   09/13/2023   After Visit Summary: (MyChart) Due to this being a telephonic visit, the after visit summary with patients personalized plan was offered to patient via MyChart   Notes: Nothing significant to report at this time.  Medical screening examination/treatment/procedure(s) were performed by non-physician practitioner and as supervising physician I was immediately available for consultation/collaboration.  I agree with above. Jacinta Shoe, MD

## 2023-09-13 NOTE — Patient Instructions (Addendum)
 [name] , Thank you for taking time to come for your Medicare Wellness Visit. I appreciate your ongoing commitment to your health goals. Please review the following plan we discussed and let me know if I can assist you in the future.   Referrals/Orders/Follow-Ups/Clinician Recommendations: Aim for 30 minutes of exercise or brisk walking, 6-8 glasses of water, and 5 servings of fruits and vegetables each day. Educated and advised to get the Tdap and Pneumonia vaccine at local pharmacy.  This is a list of the screening recommended for you and due dates:  Health Maintenance  Topic Date Due   Pneumococcal Vaccination (1 of 2 - PCV) Never done   DTaP/Tdap/Td vaccine (1 - Tdap) Never done   Medicare Annual Wellness Visit  09/12/2024   Colon Cancer Screening  09/16/2026   Hepatitis C Screening  Completed   HIV Screening  Completed   HPV Vaccine  Aged Out   Flu Shot  Discontinued   COVID-19 Vaccine  Discontinued    Advanced directives: (Provided) Advance directive discussed with you today. I have provided a copy for you to complete at home and have notarized. Once this is complete, please bring a copy in to our office so we can scan it into your chart.   Next Medicare Annual Wellness Visit scheduled for next year: Yes  Managing Pain Without Opioids Opioids are strong medicines used to treat moderate to severe pain. For some people, especially those who have long-term (chronic) pain, opioids may not be the best choice for pain management due to: Side effects like nausea, constipation, and sleepiness. The risk of addiction (opioid use disorder). The longer you take opioids, the greater your risk of addiction. Pain that lasts for more than 3 months is called chronic pain. Managing chronic pain usually requires more than one approach and is often provided by a team of health care providers working together (multidisciplinary approach). Pain management may be done at a pain management center or  pain clinic. How to manage pain without the use of opioids Use non-opioid medicines Non-opioid medicines for pain may include: Over-the-counter or prescription non-steroidal anti-inflammatory drugs (NSAIDs). These may be the first medicines used for pain. They work well for muscle and bone pain, and they reduce swelling. Acetaminophen. This over-the-counter medicine may work well for milder pain but not swelling. Antidepressants. These may be used to treat chronic pain. A certain type of antidepressant (tricyclics) is often used. These medicines are given in lower doses for pain than when used for depression. Anticonvulsants. These are usually used to treat seizures but may also reduce nerve (neuropathic) pain. Muscle relaxants. These relieve pain caused by sudden muscle tightening (spasms). You may also use a pain medicine that is applied to the skin as a patch, cream, or gel (topical analgesic), such as a numbing medicine. These may cause fewer side effects than medicines taken by mouth. Do certain therapies as directed Some therapies can help with pain management. They include: Physical therapy. You will do exercises to gain strength and flexibility. A physical therapist may teach you exercises to move and stretch parts of your body that are weak, stiff, or painful. You can learn these exercises at physical therapy visits and practice them at home. Physical therapy may also involve: Massage. Heat wraps or applying heat or cold to affected areas. Electrical signals that interrupt pain signals (transcutaneous electrical nerve stimulation, TENS). Weak lasers that reduce pain and swelling (low-level laser therapy). Signals from your body that help you learn  to regulate pain (biofeedback). Occupational therapy. This helps you to learn ways to function at home and work with less pain. Recreational therapy. This involves trying new activities or hobbies, such as a physical activity or drawing. Mental  health therapy, including: Cognitive behavioral therapy (CBT). This helps you learn coping skills for dealing with pain. Acceptance and commitment therapy (ACT) to change the way you think and react to pain. Relaxation therapies, including muscle relaxation exercises and mindfulness-based stress reduction. Pain management counseling. This may be individual, family, or group counseling.  Receive medical treatments Medical treatments for pain management include: Nerve block injections. These may include a pain blocker and anti-inflammatory medicines. You may have injections: Near the spine to relieve chronic back or neck pain. Into joints to relieve back or joint pain. Into nerve areas that supply a painful area to relieve body pain. Into muscles (trigger point injections) to relieve some painful muscle conditions. A medical device placed near your spine to help block pain signals and relieve nerve pain or chronic back pain (spinal cord stimulation device). Acupuncture. Follow these instructions at home Medicines Take over-the-counter and prescription medicines only as told by your health care provider. If you are taking pain medicine, ask your health care providers about possible side effects to watch out for. Do not drive or use heavy machinery while taking prescription opioid pain medicine. Lifestyle  Do not use drugs or alcohol to reduce pain. If you drink alcohol, limit how much you have to: 0-1 drink a day for women who are not pregnant. 0-2 drinks a day for men. Know how much alcohol is in a drink. In the [place] one drink equals one 12 oz bottle of beer (355 mL), one 5 oz glass of wine (148 mL), or one 1 oz glass of hard liquor (44 mL). Do not use any products that contain nicotine or tobacco. These products include cigarettes, chewing tobacco, and vaping devices, such as e-cigarettes. If you need help quitting, ask your health care provider. Eat a healthy diet and maintain a healthy  weight. Poor diet and excess weight may make pain worse. Eat foods that are high in fiber. These include fresh fruits and vegetables, whole grains, and beans. Limit foods that are high in fat and processed sugars, such as fried and sweet foods. Exercise regularly. Exercise lowers stress and may help relieve pain. Ask your health care provider what activities and exercises are safe for you. If your health care provider approves, join an exercise class that combines movement and stress reduction. Examples include yoga and tai chi. Get enough sleep. Lack of sleep may make pain worse. Lower stress as much as possible. Practice stress reduction techniques as told by your therapist. General instructions Work with all your pain management providers to find the treatments that work best for you. You are an important member of your pain management team. There are many things you can do to reduce pain on your own. Consider joining an online or in-person support group for people who have chronic pain. Keep all follow-up visits. This is important. Where to find more information You can find more information about managing pain without opioids from: American Academy of Pain Medicine: painmed.org Institute for Chronic Pain: instituteforchronicpain.org American Chronic Pain Association: theacpa.org Contact a health care provider if: You have side effects from pain medicine. Your pain gets worse or does not get better with treatments or home therapy. You are struggling with anxiety or depression. Summary Many types of pain can  be managed without opioids. Chronic pain may respond better to pain management without opioids. Pain is best managed when you and a team of health care providers work together. Pain management without opioids may include non-opioid medicines, medical treatments, physical therapy, mental health therapy, and lifestyle changes. Tell your health care providers if your pain gets worse or  is not being managed well enough. This information is not intended to replace advice given to you by your health care provider. Make sure you discuss any questions you have with your health care provider. Document Revised: 09/28/2020 Document Reviewed: 09/28/2020 Elsevier Patient Education  2024 [employer].

## 2023-09-13 NOTE — Telephone Encounter (Signed)
Sheet done thx

## 2023-09-18 NOTE — Telephone Encounter (Signed)
 Raelyn Number, can you call Ramie and get him an appointment to see either me or Dr. August Saucer at some point?

## 2023-09-18 NOTE — Telephone Encounter (Signed)
 Different appointment is probably better

## 2023-09-23 ENCOUNTER — Telehealth: Payer: Self-pay

## 2023-09-23 NOTE — Telephone Encounter (Signed)
 I sent a referral message on this one as well but just wanted to make sure this message got seen. Patients 3/31 surgery  is pending a denial by the medical director due to pt not having 6 wks of formal PT within the last 6 months. P2P is an option but has to be scheduled and completed by 1:00 on 3/26. In the past even with the P2P we have not gotten auths with Monia Pouch if pt has not had PT. Please advise.

## 2023-09-25 ENCOUNTER — Encounter: Payer: Self-pay | Admitting: Orthopedic Surgery

## 2023-09-25 ENCOUNTER — Ambulatory Visit: Admitting: Orthopedic Surgery

## 2023-09-25 ENCOUNTER — Other Ambulatory Visit (INDEPENDENT_AMBULATORY_CARE_PROVIDER_SITE_OTHER): Payer: Self-pay

## 2023-09-25 ENCOUNTER — Other Ambulatory Visit: Payer: Self-pay

## 2023-09-25 DIAGNOSIS — Z981 Arthrodesis status: Secondary | ICD-10-CM

## 2023-09-25 DIAGNOSIS — M25562 Pain in left knee: Secondary | ICD-10-CM

## 2023-09-25 NOTE — Telephone Encounter (Signed)
 Hi Adam Vaughn.  That guy called around 10:00 this morning.  He said to call back within the hour.  I called him back within the hour and he did not answer the phone but I left him a detailed message.  He called back about maybe 30 minutes later but of course I am seeing 21 people this morning and was not there and so he actually closed the peer to peer.  This is on him.  I called him back and he was not available.  I would have rami do 1 visit of physical therapy and then have him come back in 6 weeks and we should be able to get him scheduled at that point.  Thanks the person who close this out is Dr. Melvyn Neth and [name] needs to know that it is Dr. Melvyn Neth who decided to close out this peer to peer after I called him back and left a message.  Thanks

## 2023-09-25 NOTE — Progress Notes (Signed)
 Orthopedic Spine Surgery Office Note   Assessment: Patient is a 48 y.o. male with history of L4-S1 decompression and posterior fusion with interbody fusion who returns with persistent back pain and radiating leg pain. Has a possible pseudarthrosis at L5/S1     Plan: -Patient has tried injection, oxycodone (helps), PT, icy hot, naproxen, lyrica, tizanidine, pain management -Recommended he continue with pain management -Patient said he has seen Dr. Dutch Quint who felt that some of his nerve damage was permanent.  I agree with Dr. Jordan Likes.  His pain has been persistent since surgery and is not distributions consistent with L3/4 stenosis.  He also only has mild lateral recess stenosis -I told him that I do see a broken screw on his x-ray from today which is often seen with pseudoarthrosis.  This can contribute to back pain, but revision surgery for this would not predictably relieve his back pain.  I told him I would want to get a CT scan to confirm pseudoarthrosis and for surgical planning.  He tells me he is scheduled for both the knee and foot surgery in the coming weeks.  I told him once he has recovered from those, to contact me and I would order the CT scan.  I would want to see him back in the office after that to discuss results and possible treatment options    Patient expressed understanding of the plan and all questions were answered to the patient's satisfaction.    ___________________________________________________________________________   History: Patient is a 48 y.o. male who has been previously seen in the office for symptoms of low back pain and bilateral lower extremity pain.  He has consistently had low back pain for many years now.  He said he recently had a flare of worsening back pain but that has gotten better.  He also reports that he will randomly have leg pain.  He feels a going into his calves and on the anterior thighs.  This pain is not as consistent but he does feel it on a daily  basis.  He did not notice any improvement in this symptom after his surgery.  He reported today that he is seeing Dr. Jordan Likes who felt that some of these leg symptoms are due to permanent nerve damage.  Besides his episode of acutely worsening back pain that has resolved, he has not developed any new symptoms since he was last seen in the office.   Previous treatments: injection, oxycodone (helps), PT, icy hot, naproxen, lyrica, tizanidine, pain management   Physical Exam:   General: no acute distress, appears stated age Neurologic: alert, answering questions appropriately, following commands Respiratory: unlabored breathing on room air, symmetric chest rise Psychiatric: appropriate affect, normal cadence to speech     MSK (spine):   -Strength exam                                                   Left                  Right EHL                              4/5                  4/5 TA  5/5                  5/5 GSC                             5/5                  5/5 Knee extension            5/5                  5/5 Hip flexion                    5/5                  5/5   -Sensory exam                           Sensation intact to light touch in L3-S1 nerve distributions of bilateral lower extremities   Imaging: XRs of the lumbar spine from 09/25/2023 were independently reviewed and interpreted, showing instrumentation and interbody devices from L4-S1.  No lucency seen around the screws or the antibodies.  However, one of the S1 screws appears to be broken.  No translation is seen on the flexion/extension views.  No fracture or dislocation seen.  MRI of the lumbar spine from 05/21/2022 was previously independently reviewed and interpreted, showing mild lateral recess stenosis at L3/4. Foraminal stenosis, more significant on the right, at L3/4.    EMG/NCS from 07/19/2022 was consistent with right L3-4 radiculopathy (mild)     Patient name: Adam Vaughn Patient MRN: [id number] Date of visit: 09/25/23

## 2023-09-26 ENCOUNTER — Telehealth: Payer: Self-pay | Admitting: Orthopedic Surgery

## 2023-09-26 ENCOUNTER — Telehealth: Payer: Self-pay | Admitting: Podiatry

## 2023-09-26 DIAGNOSIS — M25562 Pain in left knee: Secondary | ICD-10-CM

## 2023-09-26 NOTE — Telephone Encounter (Signed)
 Patient would like to know what is the plan for his left knee since the insurance denied the surgery.  If physical therapy is to be done he will need to discuss this ASAP because of his upcoming surgery.  Please call him ASAP  [phone]

## 2023-09-26 NOTE — Telephone Encounter (Signed)
 DOS: 10/15/23  [place] BUNIONECTOMY LT 25366 TARSAL EXOSTECTOMY LT 44034   EFFECTIVE DATE: 08/03/23  DEDUCTIBLE: $0.00   REMAINING:  $0.00  OOP:  $4,150.00  REMAINING:  $4,056.08  CO INSURANCE : $0.00   PER KABIR A OF [employer] NO PRIOR AUTH REQ FOR CPT CODE 74259,56387  FIE#[id number]

## 2023-09-27 ENCOUNTER — Other Ambulatory Visit: Payer: Self-pay | Admitting: Cardiology

## 2023-09-28 NOTE — Telephone Encounter (Signed)
 1 visit with physical therapy for nonload bearing quad strengthening exercises with hep and follow-up with me in 6 weeks and we will get surgery scheduled at that point

## 2023-09-30 NOTE — Addendum Note (Signed)
 Addended by: Barbette Or on: 09/30/2023 08:05 AM   Modules accepted: Orders

## 2023-09-30 NOTE — Telephone Encounter (Signed)
 I talked to the pt this morning and ordered PT. 6 week followup was made as well

## 2023-09-30 NOTE — Telephone Encounter (Signed)
 Called and discussed, PT order placed in chart

## 2023-10-02 ENCOUNTER — Ambulatory Visit: Admitting: Surgical

## 2023-10-07 ENCOUNTER — Encounter: Payer: Self-pay | Admitting: Internal Medicine

## 2023-10-07 ENCOUNTER — Ambulatory Visit (INDEPENDENT_AMBULATORY_CARE_PROVIDER_SITE_OTHER): Payer: Medicare HMO | Admitting: Internal Medicine

## 2023-10-07 ENCOUNTER — Encounter: Admitting: Surgical

## 2023-10-07 VITALS — BP 110/80 | HR 60 | Temp 97.9°F | Ht 68.0 in | Wt 197.0 lb

## 2023-10-07 DIAGNOSIS — E291 Testicular hypofunction: Secondary | ICD-10-CM

## 2023-10-07 DIAGNOSIS — G8929 Other chronic pain: Secondary | ICD-10-CM

## 2023-10-07 DIAGNOSIS — M79671 Pain in right foot: Secondary | ICD-10-CM | POA: Diagnosis not present

## 2023-10-07 DIAGNOSIS — M25562 Pain in left knee: Secondary | ICD-10-CM | POA: Diagnosis not present

## 2023-10-07 DIAGNOSIS — R7989 Other specified abnormal findings of blood chemistry: Secondary | ICD-10-CM | POA: Diagnosis not present

## 2023-10-07 DIAGNOSIS — M79672 Pain in left foot: Secondary | ICD-10-CM

## 2023-10-07 DIAGNOSIS — G894 Chronic pain syndrome: Secondary | ICD-10-CM | POA: Diagnosis not present

## 2023-10-07 DIAGNOSIS — M48062 Spinal stenosis, lumbar region with neurogenic claudication: Secondary | ICD-10-CM

## 2023-10-07 DIAGNOSIS — M545 Low back pain, unspecified: Secondary | ICD-10-CM

## 2023-10-07 MED ORDER — TESTOSTERONE CYPIONATE 200 MG/ML IM SOLN: INTRAMUSCULAR | 1 refills | Status: DC

## 2023-10-07 MED ORDER — OXYCODONE HCL 10 MG PO TABS: 10.0000 mg | ORAL_TABLET | Freq: Three times a day (TID) | ORAL | 0 refills | Status: DC | PRN

## 2023-10-07 NOTE — Assessment & Plan Note (Signed)
 On Vit D+K Oxy Rx Pregabaline On Testosterone inj  2025 Per Dr Christell Constant: "Assessment: Patient is a 48 y.o. male with history of L4-S1 decompression and posterior fusion with interbody fusion who returns with persistent back pain and radiating leg pain. Has a possible pseudarthrosis at L5/S1     Plan: -Patient has tried injection, oxycodone (helps), PT, icy hot, naproxen, lyrica, tizanidine, pain management -Recommended he continue with pain management -Patient said he has seen Dr. Dutch Quint who felt that some of his nerve damage was permanent.  I agree with Dr. Jordan Likes.  His pain has been persistent since surgery and is not distributions consistent with L3/4 stenosis.  He also only has mild lateral recess stenosis -I told him that I do see a broken screw on his x-ray from today which is often seen with pseudoarthrosis.  This can contribute to back pain, but revision surgery for this would not predictably relieve his back pain.  I told him I would want to get a CT scan to confirm pseudoarthrosis and for surgical planning.  He tells me he is scheduled for both the knee and foot surgery in the coming weeks.  I told him once he has recovered from those, to contact me and I would order the CT scan.  I would want to see him back in the office after that to discuss results and possible treatment options"

## 2023-10-07 NOTE — Assessment & Plan Note (Signed)
 Falls - use a cane

## 2023-10-07 NOTE — Progress Notes (Signed)
 Subjective:  Patient ID: Adam Vaughn, male    DOB: 1975-12-14  Age: 48 y.o. MRN: [id number]  CC: Medical Management of Chronic Issues (2 Month Follow Up. Has since followed up with back specialist where they noted a broken screw (L5, S1))   HPI Adam Vaughn presents for chronic pain - Dr Christell Constant, his back specialist said that he has a broken screw (L5, S1)) Knee pain F/u GERD, hypogonadism  Outpatient Medications Prior to Visit  Medication Sig Dispense Refill   Docusate Calcium (STOOL SOFTENER PO) Take 1 tablet by mouth daily.     esomeprazole (NEXIUM) 40 MG capsule Take 1 capsule (40 mg total) by mouth 2 (two) times daily before a meal. 60 capsule 11   ezetimibe (ZETIA) 10 MG tablet Take 1 tablet (10 mg total) by mouth daily. 90 tablet 3   fluticasone (FLONASE) 50 MCG/ACT nasal spray Place 2 sprays into both nostrils daily. 16 g 6   hyoscyamine (LEVSIN SL) 0.125 MG SL tablet Take 1 tablet (0.125 mg total) by mouth every 6 (six) hours as needed. 30 tablet 2   methylcellulose (CITRUCEL) oral powder Take 1 packet by mouth daily.     NEEDLE, DISP, 23 G (BD DISP NEEDLE) 23G X 1" MISC Use for intramuscular injections. 50 each 3   NEEDLE, DISP, 25 G (B-D DISP NEEDLE 25GX1") 25G X 1" MISC Use every two weeks to inject Testosterone medication. 50 each 3   ondansetron (ZOFRAN) 4 MG tablet Take 1 tablet (4 mg total) by mouth every 8 (eight) hours as needed. 30 tablet 3   pregabalin (LYRICA) 150 MG capsule Take 1 capsule (150 mg total) by mouth 2 (two) times daily. 60 capsule 5   rosuvastatin (CRESTOR) 40 MG tablet Take 1 tablet by mouth once daily 90 tablet 2   tadalafil (CIALIS) 5 MG tablet Take 5 mg by mouth daily.     tamsulosin (FLOMAX) 0.4 MG CAPS capsule Take 1 capsule (0.4 mg total) by mouth daily. 90 capsule 3   tiZANidine (ZANAFLEX) 4 MG tablet Take 1 tablet (4 mg total) by mouth 3 (three) times daily. 180 tablet 0   triamcinolone cream (KENALOG) 0.5 % Apply 1 Application  topically 4 (four) times daily. 45 g 1   Vitamin D-Vitamin K (K2 PLUS D3) [phone] MCG-UNIT TABS 1 po daily 100 tablet 3   Oxycodone HCl 10 MG TABS Take 1 tablet (10 mg total) by mouth 3 (three) times daily as needed. 90 tablet 0   testosterone cypionate (DEPOTESTOSTERONE CYPIONATE) 200 MG/ML injection Use 200 mg IM every 10 days 10 mL 1   No facility-administered medications prior to visit.    ROS: Review of Systems  Objective:  BP 110/80   Pulse 60   Temp 97.9 F (36.6 C)   Ht 5\' 8"  (1.727 m)   Wt 197 lb (89.4 kg)   SpO2 99%   BMI 29.95 kg/m   BP Readings from Last 3 Encounters:  10/07/23 110/80  08/29/23 (!) 142/87  08/08/23 120/80    Wt Readings from Last 3 Encounters:  10/07/23 197 lb (89.4 kg)  09/13/23 196 lb (88.9 kg)  08/08/23 196 lb (88.9 kg)    Physical Exam  Lab Results  Component Value Date   WBC 7.8 08/02/2023   HGB 15.4 08/02/2023   HCT 45.9 08/02/2023   PLT 301 08/02/2023   GLUCOSE 90 06/17/2023   CHOL 124 07/25/2023   TRIG 63 07/25/2023   HDL 58 07/25/2023  LDLCALC 53 07/25/2023   ALT 55 (H) 06/17/2023   AST 27 06/17/2023   NA 140 06/17/2023   K 3.8 06/17/2023   CL 107 06/17/2023   CREATININE 0.93 06/17/2023   BUN 12 06/17/2023   CO2 26 06/17/2023   TSH 0.84 06/17/2023   PSA 1.83 09/04/2021   HGBA1C 5.5 09/04/2021    MR Knee Left  Wo Contrast Result Date: 06/12/2023 CLINICAL DATA:  Chronic bilateral knee pain EXAM: MRI OF THE LEFT KNEE WITHOUT CONTRAST TECHNIQUE: Multiplanar, multisequence MR imaging of the knee was performed. No intravenous contrast was administered. COMPARISON:  None Available. FINDINGS: MENISCI Medial: Oblique tear of the posterior horn-body junction of the medial meniscus extending to the inferior articular surface and extending into the posterior horn. Lateral: Intact. LIGAMENTS Cruciates: ACL and PCL are intact. Collaterals: Medial collateral ligament is intact. Lateral collateral ligament complex is intact.  CARTILAGE Patellofemoral: Partial-thickness cartilage loss of the medial patellar facet with cartilage fissuring and subchondral marrow edema. Medial: Mild partial-thickness cartilage loss of the weight-bearing medial femorotibial compartment. Lateral:  No chondral defect. JOINT: No joint effusion. Normal Hoffa's fat-pad. No plical thickening. POPLITEAL FOSSA: Popliteus tendon is intact. No Baker's cyst. EXTENSOR MECHANISM: Intact quadriceps tendon. Mild proximal patellar tendinosis. Intact lateral patellar retinaculum. Intact medial patellar retinaculum. Intact MPFL. BONES: No aggressive osseous lesion. No fracture or dislocation. Other: No fluid collection or hematoma. Muscles are normal. IMPRESSION: 1. Oblique tear of the posterior horn-body junction of the medial meniscus extending to the inferior articular surface and extending into the posterior horn. 2. Partial-thickness cartilage loss of the medial patellar facet with cartilage fissuring and subchondral marrow edema. 3. Mild partial-thickness cartilage loss of the weight-bearing medial femorotibial compartment. Electronically Signed   By: Elige Ko M.D.   On: 06/12/2023 08:00    Assessment & Plan:   Problem List Items Addressed This Visit     Low back pain - Primary   On Vit D+K Oxy Rx Pregabaline On Testosterone inj  2025 Per Dr Christell Constant: "Assessment: Patient is a 48 y.o. male with history of L4-S1 decompression and posterior fusion with interbody fusion who returns with persistent back pain and radiating leg pain. Has a possible pseudarthrosis at L5/S1     Plan: -Patient has tried injection, oxycodone (helps), PT, icy hot, naproxen, lyrica, tizanidine, pain management -Recommended he continue with pain management -Patient said he has seen Dr. Dutch Quint who felt that some of his nerve damage was permanent.  I agree with Dr. Jordan Likes.  His pain has been persistent since surgery and is not distributions consistent with L3/4 stenosis.  He also  only has mild lateral recess stenosis -I told him that I do see a broken screw on his x-ray from today which is often seen with pseudoarthrosis.  This can contribute to back pain, but revision surgery for this would not predictably relieve his back pain.  I told him I would want to get a CT scan to confirm pseudoarthrosis and for surgical planning.  He tells me he is scheduled for both the knee and foot surgery in the coming weeks.  I told him once he has recovered from those, to contact me and I would order the CT scan.  I would want to see him back in the office after that to discuss results and possible treatment options"      Relevant Medications   Oxycodone HCl 10 MG TABS   Spinal stenosis of lumbar region with neurogenic claudication   Falls -  use a cane      Relevant Medications   Oxycodone HCl 10 MG TABS   Low vitamin B12 level   On B complex      Knee pain   Dr August Saucer - L knee ligament repair is pending      Chronic pain syndrome   Chronic severe LBP - it is too expensive for the patient to continue to go to pain clinic.  I will have to take over his pain prescriptions.  He had a UDS screen a couple months ago.  Was signed a pain contract.  Currently on oxycodone 10 mg 3 times daily. Blue-Emu cream -- was recommended to use 2-3 times a day ?TENS unit with EMS function HOKA bondi 3 Pt had a low back injection L4-5, S1      Relevant Medications   Oxycodone HCl 10 MG TABS   Hypogonadism male   Relevant Medications   testosterone cypionate (DEPOTESTOSTERONE CYPIONATE) 200 MG/ML injection   Foot pain, bilateral   L foot surgery is pending         Meds ordered this encounter  Medications   Oxycodone HCl 10 MG TABS    Sig: Take 1 tablet (10 mg total) by mouth 3 (three) times daily as needed.    Dispense:  90 tablet    Refill:  0    Please fill on or after 10/12/23   testosterone cypionate (DEPOTESTOSTERONE CYPIONATE) 200 MG/ML injection    Sig: Use 200 mg IM every 10  days    Dispense:  10 mL    Refill:  1      Follow-up: No follow-ups on file.  Sonda Primes, MD

## 2023-10-07 NOTE — Assessment & Plan Note (Signed)
 Dr August Saucer - L knee ligament repair is pending

## 2023-10-07 NOTE — Assessment & Plan Note (Signed)
 L foot surgery is pending

## 2023-10-07 NOTE — Assessment & Plan Note (Signed)
Chronic severe LBP - it is too expensive for the patient to continue to go to pain clinic.  I will have to take over his pain prescriptions.  He had a UDS screen a couple months ago.  Was signed a pain contract.  Currently on oxycodone 10 mg 3 times daily. ?Blue-Emu cream -- was recommended to use 2-3 times a day ??TENS unit with EMS function ?HOKA bondi 3 ?Pt had a low back injection L4-5, S1 ?

## 2023-10-07 NOTE — Assessment & Plan Note (Signed)
On B complex 

## 2023-10-08 ENCOUNTER — Other Ambulatory Visit: Payer: Self-pay

## 2023-10-09 ENCOUNTER — Encounter: Payer: Self-pay | Admitting: Surgical

## 2023-10-09 ENCOUNTER — Ambulatory Visit: Admitting: Surgical

## 2023-10-09 DIAGNOSIS — M19011 Primary osteoarthritis, right shoulder: Secondary | ICD-10-CM

## 2023-10-09 NOTE — Progress Notes (Signed)
 Office Visit Note   Patient: Adam Vaughn           Date of Birth: October 15, 1975           MRN: [id number] Visit Date: 10/09/2023 Requested by: Tresa Garter, MD [address] [place],  [place] [address] PCP: Plotnikov, Georgina Quint, MD  Subjective: Chief Complaint  Patient presents with   Right Shoulder - Pain    HPI: Adam Vaughn is a 48 y.o. male who presents to the office reporting right shoulder pain.  Patient continues to have primarily superior right shoulder pain.  He had AC joint injection several months ago that did not really give him any lasting relief despite previous relief from injections in the past.  He describes primarily pain with laying on that shoulder and with lifting his arm over his head.  He has occasional mechanical clicking sensation in the shoulder region.  He does have history of SLAP tear repair and bicep tenodesis that was done several years ago by Dr. August Saucer.  Occasionally has felt pain in the shoulder to the point where it feels like his arm wants to give out on him, especially when he lifts his daughter.  It is a little better this week than it was last week when he sent MyChart message.  He also is planning to start physical therapy for his left knee sometime next week or the following week after he feels well enough following his upcoming foot surgery with Dr. Charlsie Merles..                ROS: All systems reviewed are negative as they relate to the chief complaint within the history of present illness.  Patient denies fevers or chills.  Assessment & Plan: Visit Diagnoses:  1. Arthritis of right acromioclavicular joint     Plan: Impression is AC joint arthritis that has previously responded to cortisone injections.  Ultrasound imaging from last injection was reviewed demonstrating definitive injection into the [facility] joint but no lasting relief.  At this point, think his best option would be eventual distal clavicle excision for relief of his shoulder  pain.   Ultrasound imaging of his shoulder today; we did look at his subscapularis and supraspinatus and supra looks okay but the subscap looks like there is a small insertional tear at the upper border but the rest of the tendons look fairly unremarkable.  Offered repeat injection since any possible shoulder surgery is at least a few months away at this point.  He would like to hold off on this and we will see him back after his physical therapy finishes in about 6-7 weeks with Dr. August Saucer to get him back on the schedule for surgery on his knee.  Follow-Up Instructions: No follow-ups on file.   Orders:  No orders of the defined types were placed in this encounter.  No orders of the defined types were placed in this encounter.     Procedures: No procedures performed   Clinical Data: No additional findings.  Objective: Vital Signs: There were no vitals taken for this visit.  Physical Exam:  Constitutional: Patient appears well-developed HEENT:  Head: Normocephalic Eyes:EOM are normal Neck: Normal range of motion Cardiovascular: Normal rate Pulmonary/chest: Effort normal Neurologic: Patient is alert Skin: Skin is warm Psychiatric: Patient has normal mood and affect  Ortho Exam: Ortho exam demonstrates right shoulder with 40 degrees X rotation, 110 degrees abduction.  He has excellent rotator cuff strength of supra, infra, subscap.  Subscap strength testing does reproduce some of his anterior pain.  He has pain reproduced with terminal external rotation.  Excellent bicep flexion and supination strength.  Moderate tenderness over the [facility] joint.  Mild to moderate tenderness over the lesser tuberosity.  No tenderness over the acromial process posteriorly.  No crepitus noted in the rotator cuff region with passive motion of the shoulder at 90 degrees of abduction.  Specialty Comments:  EXAM: MRI LUMBAR SPINE WITHOUT CONTRAST   TECHNIQUE: Multiplanar, multisequence MR imaging of the  lumbar spine was performed. No intravenous contrast was administered.   COMPARISON:  01/18/2021   FINDINGS: Segmentation:  Standard.   Alignment:  2 mm retrolisthesis of L3 on L4.   Vertebrae: No acute fracture, evidence of discitis, or aggressive bone lesion. 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   Conus medullaris and cauda equina: Conus extends to the T12-L1 level. Conus and cauda equina appear normal.   Paraspinal and other soft tissues: No acute paraspinal abnormality. Postsurgical changes in the posterior paraspinal soft tissues at L4-S1.   Disc levels:   Disc spaces: Posterior lumbar interbody fusion from L4 through S1.   T12-L1: No significant disc bulge. No neural foraminal stenosis. No central canal stenosis.   L1-L2: No significant disc bulge. No neural foraminal stenosis. No central canal stenosis.   L2-L3: Minimal broad-based disc bulge. No left foraminal stenosis. Mild right foraminal stenosis. No spinal stenosis.   L3-L4: Broad-based disc bulge flattening ventral thecal sac. Mild bilateral facet arthropathy. Mild bilateral foraminal stenosis. Mild spinal stenosis. Bilateral lateral recess stenosis.   L4-L5: Interbody fusion.  No foraminal or central canal stenosis.   L5-S1: Interbody fusion.  No foraminal or central canal stenosis.   IMPRESSION: 1. At L3-4 there is a broad-based disc bulge flattening ventral thecal sac. Mild bilateral facet arthropathy. Mild bilateral foraminal stenosis. Mild spinal stenosis. Bilateral lateral recess stenosis. 2. Posterior lumbar interbody fusion from L4 through S1 without foraminal or central canal stenosis. 3. No acute osseous injury of the lumbar spine.     Electronically Signed   By: Elige Ko M.D.   On: 05/22/2022 10:20  Imaging: No results found.   PMFS History: Patient Active Problem List   Diagnosis Date Noted   Contact dermatitis 06/17/2023   Hematuria 01/08/2023   History of  kidney stones 01/08/2023   Hypokalemia 12/10/2022   Foot pain, bilateral 11/12/2022   Hypogonadism male 09/19/2022   Chronic eczematous otitis externa of both ears 05/16/2022   Hypertrophy of inferior nasal turbinate 05/16/2022   Situational depression 05/14/2022   Coronary atherosclerosis 03/26/2022   Dyslipidemia 01/11/2022   Chronic pain syndrome 11/07/2021   Piriformis syndrome 11/07/2021   Bladder neck obstruction 09/04/2021   Asthmatic bronchitis 09/04/2021   Pain in right shoulder 06/29/2021   Meteorism 06/01/2021   Sinus congestion 02/28/2021   Vertigo 02/17/2021   Headache 02/17/2021   Fever 02/17/2021   Falls 02/17/2021   Allergic rhinitis 10/25/2020   Family history of acute polio 10/25/2020   Chest pain, atypical 10/10/2020   S/P laparoscopic cholecystectomy 10/10/2020   Hyperglycemia 10/10/2020   Acute cholecystitis 09/30/2020   Insomnia 09/26/2020   Knee pain 06/23/2020   Low vitamin B12 level 05/31/2020   Ankle sprain 05/31/2020   Constipation 05/31/2020   Degenerative spondylolisthesis 04/11/2020   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   DDD (degenerative disc disease), lumbar 01/20/2020   Left lumbar radiculitis 01/20/2020   Vitamin D deficiency 01/15/2020  Low back pain 01/12/2020   IBS (irritable bowel syndrome) 01/12/2020   Nephrolithiasis 01/12/2020   Erectile dysfunction 01/12/2020   GERD (gastroesophageal reflux disease) 01/12/2020   Past Medical History:  Diagnosis Date   Arthritis    Chronic back pain    Family history of colon cancer    sister   GERD (gastroesophageal reflux disease)    History of kidney stones    Hyperlipidemia    Hypertension     Family History  Problem Relation Age of Onset   Heart disease Father 103       MI   Hypertension Father    Heart attack Father    Other Father        Small intestine removed but does not know why   Diabetes Sister    Heart disease Sister 59       MI    Colon cancer Sister    Colon polyps Sister    Colon polyps Sister    Healthy Sister    Healthy Sister    Healthy Sister    Other Brother        MVA   Healthy Brother    Healthy Brother    Healthy Brother    Healthy Brother    Healthy Brother    Healthy Brother    Liver disease Neg Hx    Pancreatic cancer Neg Hx    Esophageal cancer Neg Hx    Stomach cancer Neg Hx     Past Surgical History:  Procedure Laterality Date   BACK SURGERY  04/11/2020   CHOLECYSTECTOMY N/A 10/01/2020   Procedure: LAPAROSCOPIC CHOLECYSTECTOMY;  Surgeon: Griselda Miner, MD;  Location: [facility];  Service: General;  Laterality: N/A;   COLONOSCOPY  1991   IBS   endocolon  09/15/2021   [facility], GERD   HAND SURGERY Bilateral    SHOULDER SURGERY Right 08/2021   SPINAL CORD STIMULATOR IMPLANT  2022   SPINAL CORD STIMULATOR REMOVAL  2022   TONSILLECTOMY     VASECTOMY     Social History   Occupational History   Not on file  Tobacco Use   Smoking status: Never    Passive exposure: Never   Smokeless tobacco: Never  Vaping Use   Vaping status: Never Used  Substance and Sexual Activity   Alcohol use: Not Currently    Comment: States quit around age 4   Drug use: Never   Sexual activity: Not Currently    Birth control/protection: Surgical    Comment: vastectomy

## 2023-10-12 ENCOUNTER — Encounter (HOSPITAL_COMMUNITY): Payer: Self-pay

## 2023-10-14 ENCOUNTER — Other Ambulatory Visit (HOSPITAL_COMMUNITY): Payer: Self-pay

## 2023-10-15 DIAGNOSIS — M2012 Hallux valgus (acquired), left foot: Secondary | ICD-10-CM | POA: Diagnosis not present

## 2023-10-15 DIAGNOSIS — M25775 Osteophyte, left foot: Secondary | ICD-10-CM | POA: Diagnosis not present

## 2023-10-15 DIAGNOSIS — M85872 Other specified disorders of bone density and structure, left ankle and foot: Secondary | ICD-10-CM | POA: Diagnosis not present

## 2023-10-21 ENCOUNTER — Encounter: Admitting: Podiatry

## 2023-10-24 ENCOUNTER — Ambulatory Visit (INDEPENDENT_AMBULATORY_CARE_PROVIDER_SITE_OTHER): Admitting: Podiatry

## 2023-10-24 ENCOUNTER — Ambulatory Visit (INDEPENDENT_AMBULATORY_CARE_PROVIDER_SITE_OTHER)

## 2023-10-24 DIAGNOSIS — M7752 Other enthesopathy of left foot: Secondary | ICD-10-CM

## 2023-10-24 DIAGNOSIS — M7750 Other enthesopathy of unspecified foot: Secondary | ICD-10-CM

## 2023-10-24 NOTE — Progress Notes (Signed)
 Subjective: Chief Complaint  Patient presents with   Routine Post Op    POV # 1 DOS 10/15/23 --- BIPLANAR OSTEOTOMY WITH FIXATIONLEFT, REMOVAL BONE SPUR FROM TOP LEFT FOOT-Patient states doing well pain scale 69 .   48 year old male presents the office with above concerns.  He recent underwent surgery with Dr. Celia Coles.  States he has been doing well.  He did get the bandage wet and he has changed the bandage.  Does not report any fevers or chills.  He is on pain medication at baseline for other issues as well.  Objective: AAO x3, NAD DP/PT pulses palpable bilaterally, CRT less than 3 seconds Incision well coapted with sutures intact without any evidence of dehiscence.  There are some mild edema present but there is no surrounding erythema, drainage or pus or any signs of infection.  Toe is rectus.  Mild tenderness on exam of the surgical sites. No pain with calf compression, swelling, warmth, erythema  Assessment: Status post left foot surgery  Plan: -All treatment options discussed with the patient including all alternatives, risks, complications.  -X-rays obtained reviewed.  Multiple views were obtained.  Hardware intact any complicating factors.  No evidence of acute fracture. -Dressing was reapplied.  Discussed he can wash the foot with soap and water, dry thoroughly apply a similar bandage.  Continue to ice, elevate as well as compression. -Pain in cam boot for now -Monitor for any clinical signs or symptoms of infection and directed to call the office immediately should any occur or go to the ER.  Charity Conch DPM

## 2023-11-04 ENCOUNTER — Ambulatory Visit (INDEPENDENT_AMBULATORY_CARE_PROVIDER_SITE_OTHER): Admitting: Podiatry

## 2023-11-04 DIAGNOSIS — M7750 Other enthesopathy of unspecified foot: Secondary | ICD-10-CM

## 2023-11-04 DIAGNOSIS — M7752 Other enthesopathy of left foot: Secondary | ICD-10-CM

## 2023-11-04 NOTE — Progress Notes (Signed)
 Subjective: No chief complaint on file.   48 year old male presents the office today for follow evaluation.  States he still has some stiffness in the big toe joint.  Overall his pain is improving.  He does change the bandage and wash it with soap and water.  Has not seen any redness or warmth.  No fevers or chills.    Objective: AAO x3, NAD DP/PT pulses palpable bilaterally, CRT less than 3 seconds Incision well coapted with sutures intact without any evidence of dehiscence.  Decreased edema present.  There is no signs of cellulitis, drainage or purulence.  Mild tenderness palpation at surgical site.  No pain with calf compression, swelling, warmth, erythema    Assessment: Status post left foot surgery  Plan: -All treatment options discussed with the patient including all alternatives, risks, complications.  -This time we discussed range of motion exercises to start to do gradually.  Continue ice, elevate as well as compression to help with residual edema.  Continue immobilization. -Monitor for any clinical signs or symptoms of infection and directed to call the office immediately should any occur or go to the ER.  Return in about 3 weeks (around 11/25/2023) for post-op with Dr. Celia Coles; x-ray.  Charity Conch DPM

## 2023-11-11 ENCOUNTER — Ambulatory Visit: Admitting: Orthopedic Surgery

## 2023-11-12 ENCOUNTER — Encounter: Payer: Self-pay | Admitting: Internal Medicine

## 2023-11-13 ENCOUNTER — Other Ambulatory Visit: Payer: Self-pay | Admitting: Internal Medicine

## 2023-11-13 MED ORDER — OXYCODONE HCL 10 MG PO TABS: 10.0000 mg | ORAL_TABLET | Freq: Three times a day (TID) | ORAL | 0 refills | Status: DC | PRN

## 2023-11-21 ENCOUNTER — Ambulatory Visit: Admitting: Rehabilitative and Restorative Service Providers"

## 2023-11-27 ENCOUNTER — Ambulatory Visit (INDEPENDENT_AMBULATORY_CARE_PROVIDER_SITE_OTHER)

## 2023-11-27 ENCOUNTER — Encounter: Payer: Self-pay | Admitting: Podiatry

## 2023-11-27 ENCOUNTER — Ambulatory Visit (INDEPENDENT_AMBULATORY_CARE_PROVIDER_SITE_OTHER): Admitting: Podiatry

## 2023-11-27 VITALS — Ht 68.0 in | Wt 197.0 lb

## 2023-11-27 DIAGNOSIS — M7752 Other enthesopathy of left foot: Secondary | ICD-10-CM

## 2023-11-27 DIAGNOSIS — M7750 Other enthesopathy of unspecified foot: Secondary | ICD-10-CM

## 2023-11-27 NOTE — Progress Notes (Signed)
 Subjective:   Patient ID: Adam Vaughn, male   DOB: 48 y.o.   MRN: [id number]   HPI Patient states doing very well with left foot very pleased still wearing boot   ROS      Objective:  Physical Exam  Neuro ocular status intact negative Celine Collard' sign noted wound edges are coapted well good range of motion no crepitus in the joint surface     Assessment:  Doing well post osteotomy first metatarsal left removal of dorsal spur     Plan:  H&P x-rays reviewed dispensed surgical shoe and ankle compression stocking instructed on range of motion exercises continue gradual return to soft shoes reappoint to recheck  X-rays indicate that there is good healing osteotomy fixation in place joint congruence

## 2023-12-02 ENCOUNTER — Ambulatory Visit: Admitting: Orthopedic Surgery

## 2023-12-02 ENCOUNTER — Other Ambulatory Visit (INDEPENDENT_AMBULATORY_CARE_PROVIDER_SITE_OTHER)

## 2023-12-02 ENCOUNTER — Encounter: Payer: Self-pay | Admitting: Orthopedic Surgery

## 2023-12-02 DIAGNOSIS — G5601 Carpal tunnel syndrome, right upper limb: Secondary | ICD-10-CM | POA: Diagnosis not present

## 2023-12-02 DIAGNOSIS — M542 Cervicalgia: Secondary | ICD-10-CM

## 2023-12-02 NOTE — Progress Notes (Signed)
 Office Visit Note   Patient: Adam Vaughn           Date of Birth: 06/06/1976           MRN: [id number] Visit Date: 12/02/2023 Requested by: Genia Kettering, MD [address] [place],  [place] [address] PCP: Plotnikov, Oakley Bellman, MD  Subjective: Chief Complaint  Patient presents with   Left Knee - Pain   Neck - Pain    HPI: [name] is a 48 y.o. male who presents to the office reporting for follow-up after left knee physical therapy.  He has started doing some exercises and is scheduled to see therapy this week.  Taking oxycodone  for his back.  He also reports some neck pain that is worsened last week along the midline.  Does report some hand numbness as well when he is lying on his back.  Denies any arm pain.  Right knee doing well.  Hard for him to get up after squatting.  He recently had left foot surgery but is walking around without a fracture boot.  States that in general all of his joints hurt..                ROS: All systems reviewed are negative as they relate to the chief complaint within the history of present illness.  Patient denies fevers or chills.  Assessment & Plan: Visit Diagnoses:  1. Neck pain     Plan: Impression is possible carpal tunnel syndrome.  Neck range of motion is full and plain radiographs unremarkable.  Needs nerve conduction to evaluate carpal tunnel syndrome.  Left knee has an effusion.  Has known medial meniscal pathology.  Right knee doing well after meniscal debridement.  Plan is right wrist volar splint and nerve conduction study.  6-week return after nonload bearing quad strengthening and physical therapy.  We will likely schedule knee arthroscopy and debridement at that time.  He is still having medial sided pain and does have an effusion today in the left knee but not the right knee.  Follow-Up Instructions: No follow-ups on file.   Orders:  Orders Placed This Encounter  Procedures   XR Cervical Spine 2 or 3 views    No orders of the defined types were placed in this encounter.     Procedures: No procedures performed   Clinical Data: No additional findings.  Objective: Vital Signs: There were no vitals taken for this visit.  Physical Exam:  Constitutional: Patient appears well-developed HEENT:  Head: Normocephalic Eyes:EOM are normal Neck: Normal range of motion Cardiovascular: Normal rate Pulmonary/chest: Effort normal Neurologic: Patient is alert Skin: Skin is warm Psychiatric: Patient has normal mood and affect  Ortho Exam: Patient has bilateral 5 out of 5 grip EPL FPL interosseous wrist flexion wrist extension bicep triceps and deltoid strength.  Bilateral palpable radial pulses and no paresthesias C5-T1 in either arm.  Neck range of motion flexion chin to chest with extension approximately 50 degrees with approximately 50 degrees of rotation bilaterally.  No masses lymphadenopathy or skin changes around the neck or shoulder girdle region bilaterally Ortho exam demonstrates full range of motion of both knees.  Extensor mechanism intact.  Mild effusion present.  Medial joint line tenderness is present.  McMurray compression testing positive on the left.  No effusion right knee.  No groin pain with internal/external rotation of either leg.  Collateral cruciate ligaments stable in that left knee.  No subluxation of the ulnar nerve in either  elbow.  Grip strength intact.  Hip flexion strength intact bilaterally.  No abductor pollicis brevis wasting.  Specialty Comments:  EXAM: MRI LUMBAR SPINE WITHOUT CONTRAST   TECHNIQUE: Multiplanar, multisequence MR imaging of the lumbar spine was performed. No intravenous contrast was administered.   COMPARISON:  01/18/2021   FINDINGS: Segmentation:  Standard.   Alignment:  2 mm retrolisthesis of L3 on L4.   Vertebrae: No acute fracture, evidence of discitis, or aggressive bone lesion. 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    Conus medullaris and cauda equina: Conus extends to the T12-L1 level. Conus and cauda equina appear normal.   Paraspinal and other soft tissues: No acute paraspinal abnormality. Postsurgical changes in the posterior paraspinal soft tissues at L4-S1.   Disc levels:   Disc spaces: Posterior lumbar interbody fusion from L4 through S1.   T12-L1: No significant disc bulge. No neural foraminal stenosis. No central canal stenosis.   L1-L2: No significant disc bulge. No neural foraminal stenosis. No central canal stenosis.   L2-L3: Minimal broad-based disc bulge. No left foraminal stenosis. Mild right foraminal stenosis. No spinal stenosis.   L3-L4: Broad-based disc bulge flattening ventral thecal sac. Mild bilateral facet arthropathy. Mild bilateral foraminal stenosis. Mild spinal stenosis. Bilateral lateral recess stenosis.   L4-L5: Interbody fusion.  No foraminal or central canal stenosis.   L5-S1: Interbody fusion.  No foraminal or central canal stenosis.   IMPRESSION: 1. At L3-4 there is a broad-based disc bulge flattening ventral thecal sac. Mild bilateral facet arthropathy. Mild bilateral foraminal stenosis. Mild spinal stenosis. Bilateral lateral recess stenosis. 2. Posterior lumbar interbody fusion from L4 through S1 without foraminal or central canal stenosis. 3. No acute osseous injury of the lumbar spine.     Electronically Signed   By: Onnie Bilis M.D.   On: 05/22/2022 10:20  Imaging: No results found.   PMFS History: Patient Active Problem List   Diagnosis Date Noted   Contact dermatitis 06/17/2023   Hematuria 01/08/2023   History of kidney stones 01/08/2023   Hypokalemia 12/10/2022   Foot pain, bilateral 11/12/2022   Hypogonadism male 09/19/2022   Chronic eczematous otitis externa of both ears 05/16/2022   Hypertrophy of inferior nasal turbinate 05/16/2022   Situational depression 05/14/2022   Coronary atherosclerosis 03/26/2022   Dyslipidemia  01/11/2022   Chronic pain syndrome 11/07/2021   Piriformis syndrome 11/07/2021   Bladder neck obstruction 09/04/2021   Asthmatic bronchitis 09/04/2021   Pain in right shoulder 06/29/2021   Meteorism 06/01/2021   Sinus congestion 02/28/2021   Vertigo 02/17/2021   Headache 02/17/2021   Fever 02/17/2021   Falls 02/17/2021   Allergic rhinitis 10/25/2020   Family history of acute polio 10/25/2020   Chest pain, atypical 10/10/2020   S/P laparoscopic cholecystectomy 10/10/2020   Hyperglycemia 10/10/2020   Acute cholecystitis 09/30/2020   Insomnia 09/26/2020   Knee pain 06/23/2020   Low vitamin B12 level 05/31/2020   Ankle sprain 05/31/2020   Constipation 05/31/2020   Degenerative spondylolisthesis 04/11/2020   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   DDD (degenerative disc disease), lumbar 01/20/2020   Left lumbar radiculitis 01/20/2020   Vitamin D  deficiency 01/15/2020   Low back pain 01/12/2020   IBS (irritable bowel syndrome) 01/12/2020   Nephrolithiasis 01/12/2020   Erectile dysfunction 01/12/2020   GERD (gastroesophageal reflux disease) 01/12/2020   Past Medical History:  Diagnosis Date   Arthritis    Chronic back pain    Family history of colon  cancer    sister   GERD (gastroesophageal reflux disease)    History of kidney stones    Hyperlipidemia    Hypertension     Family History  Problem Relation Age of Onset   Heart disease Father 47       MI   Hypertension Father    Heart attack Father    Other Father        Small intestine removed but does not know why   Diabetes Sister    Heart disease Sister 37       MI   Colon cancer Sister    Colon polyps Sister    Colon polyps Sister    Healthy Sister    Healthy Sister    Healthy Sister    Other Brother        MVA   Healthy Brother    Healthy Brother    Healthy Brother    Healthy Brother    Healthy Brother    Healthy Brother    Liver disease Neg Hx    Pancreatic cancer  Neg Hx    Esophageal cancer Neg Hx    Stomach cancer Neg Hx     Past Surgical History:  Procedure Laterality Date   BACK SURGERY  04/11/2020   CHOLECYSTECTOMY N/A 10/01/2020   Procedure: LAPAROSCOPIC CHOLECYSTECTOMY;  Surgeon: Caralyn Chandler, MD;  Location: [facility];  Service: General;  Laterality: N/A;   COLONOSCOPY  1991   IBS   endocolon  09/15/2021   [facility], GERD   HAND SURGERY Bilateral    SHOULDER SURGERY Right 08/2021   SPINAL CORD STIMULATOR IMPLANT  2022   SPINAL CORD STIMULATOR REMOVAL  2022   TONSILLECTOMY     VASECTOMY     Social History   Occupational History   Not on file  Tobacco Use   Smoking status: Never    Passive exposure: Never   Smokeless tobacco: Never  Vaping Use   Vaping status: Never Used  Substance and Sexual Activity   Alcohol use: Not Currently    Comment: States quit around age 67   Drug use: Never   Sexual activity: Not Currently    Birth control/protection: Surgical    Comment: vastectomy

## 2023-12-03 ENCOUNTER — Encounter: Payer: Self-pay | Admitting: Orthopedic Surgery

## 2023-12-03 DIAGNOSIS — M5416 Radiculopathy, lumbar region: Secondary | ICD-10-CM

## 2023-12-09 ENCOUNTER — Ambulatory Visit: Admitting: Physical Therapy

## 2023-12-09 ENCOUNTER — Encounter: Payer: Self-pay | Admitting: Physical Therapy

## 2023-12-09 DIAGNOSIS — M6281 Muscle weakness (generalized): Secondary | ICD-10-CM

## 2023-12-09 DIAGNOSIS — M25562 Pain in left knee: Secondary | ICD-10-CM

## 2023-12-09 DIAGNOSIS — M542 Cervicalgia: Secondary | ICD-10-CM | POA: Diagnosis not present

## 2023-12-09 DIAGNOSIS — R293 Abnormal posture: Secondary | ICD-10-CM | POA: Diagnosis not present

## 2023-12-09 DIAGNOSIS — R262 Difficulty in walking, not elsewhere classified: Secondary | ICD-10-CM

## 2023-12-09 NOTE — Therapy (Signed)
 OUTPATIENT PHYSICAL THERAPY CERVICAL /Lower Extremity EVALUATION   Patient Name: Adam Vaughn MRN: [id number] DOB:Dec 09, 1975, 48 y.o., male Today's Date: 12/09/2023  END OF SESSION:  PT End of Session - 12/09/23 1609     Visit Number 1    Number of Visits 16    Date for PT Re-Evaluation 02/07/24    PT Start Time 1345    PT Stop Time 1430    PT Time Calculation (min) 45 min    Activity Tolerance Patient tolerated treatment well;Patient limited by pain    Behavior During Therapy Flat affect;WFL for tasks assessed/performed             Past Medical History:  Diagnosis Date   Arthritis    Chronic back pain    Family history of colon cancer    sister   GERD (gastroesophageal reflux disease)    History of kidney stones    Hyperlipidemia    Hypertension    Past Surgical History:  Procedure Laterality Date   BACK SURGERY  04/11/2020   CHOLECYSTECTOMY N/A 10/01/2020   Procedure: LAPAROSCOPIC CHOLECYSTECTOMY;  Surgeon: Caralyn Chandler, MD;  Location: [facility];  Service: General;  Laterality: N/A;   COLONOSCOPY  1991   IBS   endocolon  09/15/2021   [facility], GERD   HAND SURGERY Bilateral    SHOULDER SURGERY Right 08/2021   SPINAL CORD STIMULATOR IMPLANT  2022   SPINAL CORD STIMULATOR REMOVAL  2022   TONSILLECTOMY     VASECTOMY     Patient Active Problem List   Diagnosis Date Noted   Contact dermatitis 06/17/2023   Hematuria 01/08/2023   History of kidney stones 01/08/2023   Hypokalemia 12/10/2022   Foot pain, bilateral 11/12/2022   Hypogonadism male 09/19/2022   Chronic eczematous otitis externa of both ears 05/16/2022   Hypertrophy of inferior nasal turbinate 05/16/2022   Situational depression 05/14/2022   Coronary atherosclerosis 03/26/2022   Dyslipidemia 01/11/2022   Chronic pain syndrome 11/07/2021   Piriformis syndrome 11/07/2021   Bladder neck obstruction 09/04/2021   Asthmatic bronchitis 09/04/2021   Pain in right shoulder 06/29/2021   Meteorism  06/01/2021   Sinus congestion 02/28/2021   Vertigo 02/17/2021   Headache 02/17/2021   Fever 02/17/2021   Falls 02/17/2021   Allergic rhinitis 10/25/2020   Family history of acute polio 10/25/2020   Chest pain, atypical 10/10/2020   S/P laparoscopic cholecystectomy 10/10/2020   Hyperglycemia 10/10/2020   Acute cholecystitis 09/30/2020   Insomnia 09/26/2020   Knee pain 06/23/2020   Low vitamin B12 level 05/31/2020   Ankle sprain 05/31/2020   Constipation 05/31/2020   Degenerative spondylolisthesis 04/11/2020   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   DDD (degenerative disc disease), lumbar 01/20/2020   Left lumbar radiculitis 01/20/2020   Vitamin D  deficiency 01/15/2020   Low back pain 01/12/2020   IBS (irritable bowel syndrome) 01/12/2020   Nephrolithiasis 01/12/2020   Erectile dysfunction 01/12/2020   GERD (gastroesophageal reflux disease) 01/12/2020    PCP: Genia Kettering, MD   REFERRING PROVIDER: Jasmine Mesi, MD   REFERRING DIAG:  Diagnosis  M25.562 (ICD-10-CM) - Acute pain of left knee    THERAPY DIAG:  Acute pain of left knee  Difficulty in walking, not elsewhere classified  Cervicalgia  Muscle weakness (generalized)  Abnormal posture  Rationale for Evaluation and Treatment: Rehabilitation  ONSET DATE: several months per pt report  SUBJECTIVE:  SUBJECTIVE STATEMENT: Pt is a 48 y.o. male who presents to the office reporting for follow-up after performing self exercises for his knees.  Taking oxycodone  for his back.  He also reports some neck pain that is worsened last week along the midline.  Does report some hand numbness as well when he is lying on his back.  Denies any arm pain.  Right knee doing well, however  pt reporting bilateral knee buckling at times with >/= 10 falls over the last 6 months. Hard for him to get up after squatting.  He recently had left foot surgery but is walking around without a fracture boot.    PERTINENT HISTORY:  Rt shoulder surgery 08/2022, foot surgery 11/14/23, spinal fusion 04/2020  PAIN:  NPRS scale: 5/10 left knee, 5/10 neck  Pain description: achy mostly sharp at times, neck pain can be tingling and stabbing at times, and HA Aggravating factors: bending, squatting, standing, walking, in the knee, turning head or looking upward in the neck Relieving factors: prescription pain meds  PRECAUTIONS: None  RED FLAGS: None  WEIGHT BEARING RESTRICTIONS: No  FALLS:  Has patient fallen in last 6 months? Yes. Number of falls 10 due to bil knee buckeling  LIVING ENVIRONMENT: Lives with: lives with their family and lives with their spouse Lives in: House/apartment Stairs: Yes: External: 3 steps; on left going up Has following equipment at home: Single point cane  OCCUPATION: disabled  PLOF: Independent  PATIENT GOALS: stop hurting, walk without pain  Next MD visit:  OBJECTIVE:   DIAGNOSTIC FINDINGS:  Result Narrative: 12/02/23  AP lateral cervical spine radiographs reviewed.  No acute fracture or spondylolisthesis.  Normal lordosis.  No significant degenerative changes between the vertebral bodies or in the facet joints.  Knee - Left 05/2023 1. Oblique tear of the posterior horn-body junction of the medial meniscus extending to the inferior articular surface and extending into the posterior horn. 2. Partial-thickness cartilage loss of the medial patellar facet with cartilage fissuring and subchondral marrow edema. 3. Mild partial-thickness cartilage loss of the weight-bearing medial femorotibial compartment.     PATIENT SURVEYS: Patient-Specific Activity Scoring Scheme  "0" represents "unable to perform." "10" represents "able to perform at prior  level. 0 1 2 3 4 5 6 7 8 9  10 (Date and Score)   Activity Eval  12/09/23    1. squatting 6     2. Bending my knee over my other knee (crossing leg) 7     3. sleeping 6   4.sit to stand, stand to sit 7   5.    Score 6.5    Total score = sum of the activity scores/number of activities Minimum detectable change (90%CI) for average score = 2 points Minimum detectable change (90%CI) for single activity score = 3 points    COGNITION: Overall cognitive status: Within functional limits for tasks assessed  SENSATION: Numbness noted down bilateral UE's at times especially when sleeping  POSTURE:  rounded shoulders, forward head, and decreased lumbar lordosis  PALPATION: TTP: anterior left knee with mild edema noted  Bilateral cervical paraspinals and upper traps   CERVICAL ROM:   ROM AROM (deg) eval  Flexion 22 pain on left side  Extension 18 moderate pain   Right lateral flexion 28 pain noted  Left lateral flexion 18 pain noted  Right rotation 24 c pain  Left rotation 32 c pain   (Blank rows = not tested)  UPPER EXTREMITY ROM:   ROM Right eval Left  eval  Shoulder flexion 160 160  Shoulder extension    Shoulder abduction    Shoulder adduction    Shoulder extension    Shoulder internal rotation    Shoulder external rotation 45 40  Elbow flexion    Elbow extension        Knee flexion    Knee extension                 (Blank rows = not tested)  UPPER EXTREMITY MMT:  MMT Right eval Left eval  Shoulder flexion 11.3 ppsi Pain at anterior shoulder at [facility] joint 33.7 ppsi  Shoulder extension    Shoulder abduction    Shoulder adduction    Shoulder extension    Shoulder internal rotation    Shoulder external rotation    Middle trapezius    Lower trapezius    Elbow flexion    Elbow extension        Knee flexion 43.2 ppsi 12.2 ppsi Pain limiting  Knee extension 43.9 ppsi 14.7 ppsi              Grip strength     (Blank rows = not  tested)                                                                                                                                                                                     TODAY'S TREATMENT:                                                                                                       DATE:  12/09/23:  Therex: HEP instruction/performance c cues for techniques, handout provided.  Trial set performed of each for comprehension and symptom assessment.  See below for exercise list Self Care: Sleeping position and using pillows for support  under bil shoulders to help with numbness during the night while sleeping on his back  PATIENT EDUCATION:  Education details: HEP, POC Person educated: Patient Education method: Explanation, Demonstration, Verbal cues, and Handouts Education comprehension: verbalized understanding, returned demonstration, and verbal cues required  HOME EXERCISE PROGRAM: Access Code: [email or web address] URL: [email or web address] Date: 12/09/2023 Prepared by: Jerrel Mor  Exercises - Supine Cervical Retraction with Towel  - 2 x daily - 7  x weekly - 10 reps - 5 secconds hold - Supine Cervical Rotation Passive Unloaded  - 2 x daily - 7 x weekly - 5 reps - 5-10 seconds hold - Supine Quad Set  - 2 x daily - 7 x weekly - 10 reps - Seated Long Arc Quad  - 2 x daily - 7 x weekly - 2 sets - 10 reps  ASSESSMENT:  CLINICAL IMPRESSION: Patient is a 48 y.o. who comes to clinic with complaints of neck pain and left knee pain. Pt with chronic LBP with history of spinal fusion and pt reported nerve damage. Pt having difficulty lying in supine position with knees extended due to increased pain. Pt also reporting history of bilateral knee buckling while walking/standing with history of falls. Pt presenting  with mobility, strength and movement coordination deficits that impair their ability to perform usual daily and recreational functional activities  without increase difficulty/symptoms at this time.  Patient to benefit from skilled PT services to address impairments and limitations to improve to previous level of function without restriction secondary to condition.    OBJECTIVE IMPAIRMENTS: Abnormal gait, decreased activity tolerance, decreased mobility, difficulty walking, decreased ROM, decreased strength, impaired flexibility, postural dysfunction, and pain.   ACTIVITY LIMITATIONS: lifting, bending, sitting, standing, squatting, sleeping, stairs, and transfers  PARTICIPATION LIMITATIONS: driving, community activity, and yard work  PERSONAL FACTORS: 3+ comorbidities: see PMH above are also affecting patient's functional outcome.   REHAB POTENTIAL: Good  CLINICAL DECISION MAKING: Evolving/moderate complexity  EVALUATION COMPLEXITY: Moderate   GOALS: Goals reviewed with patient? Yes  SHORT TERM GOALS: (target date for Short term goals are 3 weeks 12/30/2023 )  1.Patient will demonstrate independent use of home exercise program to maintain progress from in clinic treatments. Goal status: New  LONG TERM GOALS: (target dates for all long term goals are  weeks  8 weeks, 02/03/2024 )   1. Patient will demonstrate/report pain at worst less than or equal to 2/10 to facilitate minimal limitation in daily activity secondary to pain symptoms. Goal status: New   2. Patient will demonstrate independent use of home exercise program to facilitate ability to maintain/progress functional gains from skilled physical therapy services. Goal status: New   3. Patient will demonstrate Patient specific functional scale avg > or = 8.5 to indicate reduced disability due to condition.  Goal status: New   4.  Patient will demonstrate bilateral cervical rotation AROM to >/= 45 degrees s symptoms to facilitate usual head movements for daily activity including driving, self care.   Goal status: New   5.   Pt will improve his Left knee MMT to >/= 20 ppsi  for flexion and extension to improve functional mobility.  Goal status: New    PLAN:  PT FREQUENCY: 1-2x/week  PT DURATION: 8 weeks  Can include 16109- PT Re-evaluation, 97110-Therapeutic exercises, 97530- Therapeutic activity, 97112- Neuromuscular re-education, 97535- Self Care, 97140- Manual therapy, [phone]- Gait training, [phone]- Orthotic Fit/training, [phone]- Canalith repositioning, [id number]- Aquatic Therapy, [phone] Electrical stimulation (unattended), [phone]- Electrical stimulation (manual), [id number]- Vasopneumatic device, [id number]- Ultrasound, [id number] Physical performance testing, [id number]- Traction (mechanical), [id number]- Ionotophoresis 4mg /ml Dexamethasone , Patient/Family education, Balance training, Stair training, Taping, Dry Needling, Joint mobilization, Joint manipulation, Spinal manipulation, Spinal mobilization, Scar mobilization, Vestibular training, Visual/preceptual remediation/compensation, DME instructions, Cryotherapy, and Moist heat.  All performed as medically necessary.  All included unless contraindicated  PLAN FOR NEXT SESSION: Check HEP use/response, measure knee ROM bilaterally, knee strengthening, TUG or balance assessment    Algie Ingle  Gaylyn Keas, PT, MPT 12/09/2023, 4:11 PM

## 2023-12-12 ENCOUNTER — Ambulatory Visit: Admitting: Internal Medicine

## 2023-12-12 VITALS — BP 118/74 | HR 80 | Temp 98.4°F | Ht 68.0 in | Wt 195.0 lb

## 2023-12-12 DIAGNOSIS — M2550 Pain in unspecified joint: Secondary | ICD-10-CM | POA: Insufficient documentation

## 2023-12-12 DIAGNOSIS — G8929 Other chronic pain: Secondary | ICD-10-CM

## 2023-12-12 DIAGNOSIS — M2559 Pain in other specified joint: Secondary | ICD-10-CM

## 2023-12-12 DIAGNOSIS — R739 Hyperglycemia, unspecified: Secondary | ICD-10-CM

## 2023-12-12 DIAGNOSIS — N529 Male erectile dysfunction, unspecified: Secondary | ICD-10-CM | POA: Diagnosis not present

## 2023-12-12 DIAGNOSIS — R7989 Other specified abnormal findings of blood chemistry: Secondary | ICD-10-CM

## 2023-12-12 DIAGNOSIS — G4700 Insomnia, unspecified: Secondary | ICD-10-CM

## 2023-12-12 DIAGNOSIS — J4520 Mild intermittent asthma, uncomplicated: Secondary | ICD-10-CM

## 2023-12-12 DIAGNOSIS — K219 Gastro-esophageal reflux disease without esophagitis: Secondary | ICD-10-CM | POA: Diagnosis not present

## 2023-12-12 DIAGNOSIS — M545 Low back pain, unspecified: Secondary | ICD-10-CM | POA: Diagnosis not present

## 2023-12-12 DIAGNOSIS — E291 Testicular hypofunction: Secondary | ICD-10-CM

## 2023-12-12 MED ORDER — PREGABALIN 150 MG PO CAPS: 150.0000 mg | ORAL_CAPSULE | Freq: Three times a day (TID) | ORAL | 3 refills | Status: DC

## 2023-12-12 MED ORDER — TIZANIDINE HCL 4 MG PO TABS: 4.0000 mg | ORAL_TABLET | Freq: Three times a day (TID) | ORAL | 1 refills | Status: AC

## 2023-12-12 MED ORDER — OXYCODONE HCL 10 MG PO TABS: 10.0000 mg | ORAL_TABLET | Freq: Three times a day (TID) | ORAL | 0 refills | Status: DC | PRN

## 2023-12-12 MED ORDER — PREDNISONE 10 MG PO TABS: ORAL_TABLET | ORAL | 1 refills | Status: AC

## 2023-12-12 NOTE — Assessment & Plan Note (Addendum)
 On Vit D+K Oxy Rx Pregabalin  -increase to 150 mg tid On Testosterone  inj Worse Prednisone  10 mg: take 4 tabs a day x 3 days; then 3 tabs a day x 4 days; then 2 tabs a day x 4 days, then 1 tab a day x 6 days, then stop. Take pc.

## 2023-12-12 NOTE — Assessment & Plan Note (Signed)
Proair MDI prn

## 2023-12-12 NOTE — Assessment & Plan Note (Signed)
On B complex 

## 2023-12-12 NOTE — Assessment & Plan Note (Signed)
 Chronic

## 2023-12-12 NOTE — Assessment & Plan Note (Signed)
Labs Treat LBP Cialis 5, 10 or 20 mg prn

## 2023-12-12 NOTE — Progress Notes (Signed)
 Subjective:  Patient ID: Adam Vaughn, male    DOB: [birth year]  Age: 48 y.o. MRN: [id number]  CC: Medical Management of Chronic Issues (2 mnth f/u back pain is the same.. Pt states that all his joint have been hurting wants to discuss possible solutions with PCP.Adam Vaughn Need meds refilled )   HPI Adam Vaughn presents for LBP - not better, gait disorder, GERD, hypogonadism Father died recently  Outpatient Medications Prior to Visit  Medication Sig Dispense Refill   Docusate Calcium  (STOOL SOFTENER PO) Take 1 tablet by mouth daily.     esomeprazole  (NEXIUM ) 40 MG capsule Take 1 capsule (40 mg total) by mouth 2 (two) times daily before a meal. 60 capsule 11   ezetimibe  (ZETIA ) 10 MG tablet Take 1 tablet (10 mg total) by mouth daily. 90 tablet 3   fluticasone  (FLONASE ) 50 MCG/ACT nasal spray Place 2 sprays into both nostrils daily. 16 g 6   hyoscyamine  (LEVSIN SL) 0.125 MG SL tablet Take 1 tablet (0.125 mg total) by mouth every 6 (six) hours as needed. 30 tablet 2   methylcellulose (CITRUCEL) oral powder Take 1 packet by mouth daily.     NEEDLE, DISP, 23 G (BD DISP NEEDLE) 23G X 1 MISC Use for intramuscular injections. 50 each 3   NEEDLE, DISP, 25 G (B-D DISP NEEDLE 25GX1) 25G X 1 MISC Use every two weeks to inject Testosterone  medication. 50 each 3   ondansetron  (ZOFRAN ) 4 MG tablet Take 1 tablet (4 mg total) by mouth every 8 (eight) hours as needed. 30 tablet 3   rosuvastatin  (CRESTOR ) 40 MG tablet Take 1 tablet by mouth once daily 90 tablet 2   tadalafil  (CIALIS ) 5 MG tablet Take 5 mg by mouth daily.     tamsulosin  (FLOMAX ) 0.4 MG CAPS capsule Take 1 capsule (0.4 mg total) by mouth daily. 90 capsule 3   testosterone  cypionate (DEPOTESTOSTERONE CYPIONATE) 200 MG/ML injection Use 200 mg IM every 10 days 10 mL 1   triamcinolone  cream (KENALOG ) 0.5 % Apply 1 Application topically 4 (four) times daily. 45 g 1   Vitamin D -Vitamin K (K2 PLUS D3) [phone] MCG-UNIT TABS 1 po daily 100  tablet 3   Oxycodone  HCl 10 MG TABS Take 1 tablet (10 mg total) by mouth 3 (three) times daily as needed. 90 tablet 0   pregabalin  (LYRICA ) 150 MG capsule Take 1 capsule (150 mg total) by mouth 2 (two) times daily. 60 capsule 5   tiZANidine  (ZANAFLEX ) 4 MG tablet Take 1 tablet (4 mg total) by mouth 3 (three) times daily. 180 tablet 0   No facility-administered medications prior to visit.    ROS: Review of Systems  Constitutional:  Negative for appetite change, fatigue and unexpected weight change.  HENT:  Negative for congestion, nosebleeds, sneezing, sore throat and trouble swallowing.   Eyes:  Negative for itching and visual disturbance.  Respiratory:  Negative for cough.   Cardiovascular:  Negative for chest pain, palpitations and leg swelling.  Gastrointestinal:  Negative for abdominal distention, blood in stool, diarrhea and nausea.  Genitourinary:  Negative for frequency and hematuria.  Musculoskeletal:  Positive for back pain and gait problem. Negative for arthralgias, joint swelling and neck pain.  Skin:  Negative for rash.  Neurological:  Negative for dizziness, tremors, speech difficulty and weakness.  Psychiatric/Behavioral:  Negative for agitation, dysphoric mood, sleep disturbance and suicidal ideas. The patient is not nervous/anxious.     Objective:  BP 118/74   Pulse 80  Temp 98.4 F (36.9 C) (Oral)   Ht 5' 8 (1.727 m)   Wt 195 lb (88.5 kg)   SpO2 98%   BMI 29.65 kg/m   BP Readings from Last 3 Encounters:  12/12/23 118/74  10/07/23 110/80  08/29/23 (!) 142/87    Wt Readings from Last 3 Encounters:  12/12/23 195 lb (88.5 kg)  11/27/23 197 lb (89.4 kg)  10/07/23 197 lb (89.4 kg)    Physical Exam Constitutional:      General: He is not in acute distress.    Appearance: Normal appearance. He is well-developed.     Comments: NAD   Eyes:     Conjunctiva/sclera: Conjunctivae normal.     Pupils: Pupils are equal, round, and reactive to light.   Neck:      Thyroid : No thyromegaly.     Vascular: No JVD.   Cardiovascular:     Rate and Rhythm: Normal rate and regular rhythm.     Heart sounds: Normal heart sounds. No murmur heard.    No friction rub. No gallop.  Pulmonary:     Effort: Pulmonary effort is normal. No respiratory distress.     Breath sounds: Normal breath sounds. No wheezing or rales.  Chest:     Chest wall: No tenderness.  Abdominal:     General: Bowel sounds are normal. There is no distension.     Palpations: Abdomen is soft. There is no mass.     Tenderness: There is no abdominal tenderness. There is no guarding or rebound.   Musculoskeletal:        General: Tenderness present. Normal range of motion.     Cervical back: Normal range of motion.     Right lower leg: No edema.     Left lower leg: No edema.  Lymphadenopathy:     Cervical: No cervical adenopathy.   Skin:    General: Skin is warm and dry.     Findings: No rash.   Neurological:     Mental Status: He is alert and oriented to person, place, and time.     Cranial Nerves: No cranial nerve deficit.     Motor: No abnormal muscle tone.     Coordination: Coordination normal.     Gait: Gait normal.     Deep Tendon Reflexes: Reflexes are normal and symmetric.   Psychiatric:        Behavior: Behavior normal.        Thought Content: Thought content normal.        Judgment: Judgment normal.    LS w/pain Antalgic gait   Lab Results  Component Value Date   WBC 7.8 08/02/2023   HGB 15.4 08/02/2023   HCT 45.9 08/02/2023   PLT 301 08/02/2023   GLUCOSE 90 06/17/2023   CHOL 124 07/25/2023   TRIG 63 07/25/2023   HDL 58 07/25/2023   LDLCALC 53 07/25/2023   ALT 55 (H) 06/17/2023   AST 27 06/17/2023   NA 140 06/17/2023   K 3.8 06/17/2023   CL 107 06/17/2023   CREATININE 0.93 06/17/2023   BUN 12 06/17/2023   CO2 26 06/17/2023   TSH 0.84 06/17/2023   PSA 1.83 09/04/2021   HGBA1C 5.5 09/04/2021    MR Knee Left  Wo Contrast Result Date:  06/12/2023 CLINICAL DATA:  Chronic bilateral knee pain EXAM: MRI OF THE LEFT KNEE WITHOUT CONTRAST TECHNIQUE: Multiplanar, multisequence MR imaging of the knee was performed. No intravenous contrast was administered. COMPARISON:  None Available. FINDINGS:  MENISCI Medial: Oblique tear of the posterior horn-body junction of the medial meniscus extending to the inferior articular surface and extending into the posterior horn. Lateral: Intact. LIGAMENTS Cruciates: ACL and PCL are intact. Collaterals: Medial collateral ligament is intact. Lateral collateral ligament complex is intact. CARTILAGE Patellofemoral: Partial-thickness cartilage loss of the medial patellar facet with cartilage fissuring and subchondral marrow edema. Medial: Mild partial-thickness cartilage loss of the weight-bearing medial femorotibial compartment. Lateral:  No chondral defect. JOINT: No joint effusion. Normal Hoffa's fat-pad. No plical thickening. POPLITEAL FOSSA: Popliteus tendon is intact. No Baker's cyst. EXTENSOR MECHANISM: Intact quadriceps tendon. Mild proximal patellar tendinosis. Intact lateral patellar retinaculum. Intact medial patellar retinaculum. Intact MPFL. BONES: No aggressive osseous lesion. No fracture or dislocation. Other: No fluid collection or hematoma. Muscles are normal. IMPRESSION: 1. Oblique tear of the posterior horn-body junction of the medial meniscus extending to the inferior articular surface and extending into the posterior horn. 2. Partial-thickness cartilage loss of the medial patellar facet with cartilage fissuring and subchondral marrow edema. 3. Mild partial-thickness cartilage loss of the weight-bearing medial femorotibial compartment. Electronically Signed   By: Onnie Bilis M.D.   On: 06/12/2023 08:00    Assessment & Plan:   Problem List Items Addressed This Visit     Low back pain   On Vit D+K Oxy Rx Pregabalin  -increase to 150 mg tid On Testosterone  inj Worse Prednisone  10 mg: take 4 tabs  a day x 3 days; then 3 tabs a day x 4 days; then 2 tabs a day x 4 days, then 1 tab a day x 6 days, then stop. Take pc.       Relevant Medications   Oxycodone  HCl 10 MG TABS   tiZANidine  (ZANAFLEX ) 4 MG tablet   predniSONE  (DELTASONE ) 10 MG tablet   Erectile dysfunction   Labs Treat LBP Cialis  5, 10 or 20 mg prn      GERD (gastroesophageal reflux disease) - Primary   On Aciphex  and Pepcid  F/u w/GI - Dr Howard Macho      Low vitamin B12 level   On B complex      Insomnia   Chronic       Hyperglycemia   Check A1c      Asthmatic bronchitis   Proair  MDI prn      Relevant Medications   predniSONE  (DELTASONE ) 10 MG tablet   Hypogonadism male   Use testosterone  at the same dose 200 mg every 10 days (not every 14 days) IM.  Potential benefits of a long term sex steroid  use as well as potential risks  and complications were explained to the patient and were aknowledged.`      Arthralgia   Worse                  Meds ordered this encounter  Medications   Oxycodone  HCl 10 MG TABS    Sig: Take 1 tablet (10 mg total) by mouth 3 (three) times daily as needed.    Dispense:  90 tablet    Refill:  0    Please fill on or after 12/12/23   tiZANidine  (ZANAFLEX ) 4 MG tablet    Sig: Take 1 tablet (4 mg total) by mouth 3 (three) times daily.    Dispense:  180 tablet    Refill:  1   pregabalin  (LYRICA ) 150 MG capsule    Sig: Take 1 capsule (150 mg total) by mouth 3 (three) times daily.    Dispense:  90 capsule  Refill:  3   predniSONE  (DELTASONE ) 10 MG tablet    Sig: Prednisone  10 mg: take 4 tabs a day x 3 days; then 3 tabs a day x 4 days; then 2 tabs a day x 4 days, then 1 tab a day x 6 days, then stop. Take pc.    Dispense:  38 tablet    Refill:  1      Follow-up: Return in about 2 months (around 02/11/2024) for a follow-up visit.  Anitra Barn, MD

## 2023-12-12 NOTE — Assessment & Plan Note (Signed)
On Aciphex and Pepcid F/u w/GI - Dr Ardis Hughs

## 2023-12-12 NOTE — Assessment & Plan Note (Signed)
 Check A1c.

## 2023-12-12 NOTE — Assessment & Plan Note (Signed)
Use testosterone at the same dose 200 mg every 10 days (not every 14 days) IM.  Potential benefits of a long term sex steroid  use as well as potential risks  and complications were explained to the patient and were aknowledged.

## 2023-12-12 NOTE — Assessment & Plan Note (Signed)
 Worse

## 2023-12-16 ENCOUNTER — Ambulatory Visit: Admitting: Internal Medicine

## 2023-12-20 ENCOUNTER — Other Ambulatory Visit: Payer: Self-pay | Admitting: Cardiology

## 2023-12-20 DIAGNOSIS — E7800 Pure hypercholesterolemia, unspecified: Secondary | ICD-10-CM

## 2023-12-27 ENCOUNTER — Ambulatory Visit: Admitting: Rehabilitative and Restorative Service Providers"

## 2023-12-27 ENCOUNTER — Encounter: Payer: Self-pay | Admitting: Rehabilitative and Restorative Service Providers"

## 2023-12-27 DIAGNOSIS — M6281 Muscle weakness (generalized): Secondary | ICD-10-CM

## 2023-12-27 DIAGNOSIS — R262 Difficulty in walking, not elsewhere classified: Secondary | ICD-10-CM | POA: Diagnosis not present

## 2023-12-27 DIAGNOSIS — R293 Abnormal posture: Secondary | ICD-10-CM

## 2023-12-27 DIAGNOSIS — M25562 Pain in left knee: Secondary | ICD-10-CM

## 2023-12-27 DIAGNOSIS — M542 Cervicalgia: Secondary | ICD-10-CM | POA: Diagnosis not present

## 2023-12-27 NOTE — Therapy (Signed)
 OUTPATIENT PHYSICAL THERAPY TREATMENT   Patient Name: TARA WICH MRN: [id number] DOB:[birth year], 48 y.o., male Today's Date: 12/27/2023  END OF SESSION:  PT End of Session - 12/27/23 0924     Visit Number 2    Number of Visits 16    Date for PT Re-Evaluation 02/07/24    Authorization Type [employer]    Progress Note Due on Visit 10    PT Start Time 0923    PT Stop Time 1001    PT Time Calculation (min) 38 min    Activity Tolerance Patient limited by pain    Behavior During Therapy WFL for tasks assessed/performed           Past Medical History:  Diagnosis Date   Arthritis    Chronic back pain    Family history of colon cancer    sister   GERD (gastroesophageal reflux disease)    History of kidney stones    Hyperlipidemia    Hypertension    Past Surgical History:  Procedure Laterality Date   BACK SURGERY  04/11/2020   CHOLECYSTECTOMY N/A 10/01/2020   Procedure: LAPAROSCOPIC CHOLECYSTECTOMY;  Surgeon: Curvin Deward MOULD, MD;  Location: [facility];  Service: General;  Laterality: N/A;   COLONOSCOPY  1991   IBS   endocolon  09/15/2021   [facility], GERD   HAND SURGERY Bilateral    SHOULDER SURGERY Right 08/2021   SPINAL CORD STIMULATOR IMPLANT  2022   SPINAL CORD STIMULATOR REMOVAL  2022   TONSILLECTOMY     VASECTOMY     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   Chronic eczematous otitis externa of both ears 05/16/2022   Hypertrophy of inferior nasal turbinate 05/16/2022   Situational depression 05/14/2022   Coronary atherosclerosis 03/26/2022   Dyslipidemia 01/11/2022   Chronic pain syndrome 11/07/2021   Piriformis syndrome 11/07/2021   Bladder neck obstruction 09/04/2021   Asthmatic bronchitis 09/04/2021   Pain in right shoulder  06/29/2021   Meteorism 06/01/2021   Sinus congestion 02/28/2021   Vertigo 02/17/2021   Headache 02/17/2021   Fever 02/17/2021   Falls 02/17/2021   Allergic rhinitis 10/25/2020   Family history of acute polio 10/25/2020   Chest pain, atypical 10/10/2020   S/P laparoscopic cholecystectomy 10/10/2020   Hyperglycemia 10/10/2020   Acute cholecystitis 09/30/2020   Insomnia 09/26/2020   Knee pain 06/23/2020   Low vitamin B12 level 05/31/2020   Ankle sprain 05/31/2020   Constipation 05/31/2020   Degenerative spondylolisthesis 04/11/2020   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   DDD (degenerative disc disease), lumbar 01/20/2020   Left lumbar radiculitis 01/20/2020   Vitamin D  deficiency 01/15/2020   Low back pain 01/12/2020   IBS (irritable bowel syndrome) 01/12/2020   Nephrolithiasis 01/12/2020   Erectile dysfunction 01/12/2020   GERD (gastroesophageal reflux disease) 01/12/2020    PCP: Garald Karlynn GAILS, MD   REFERRING PROVIDER: Addie Cordella Hamilton, MD   REFERRING DIAG:  Diagnosis  M25.562 (ICD-10-CM) - Acute pain of left knee    THERAPY DIAG:  Acute pain of left knee  Difficulty in walking, not elsewhere classified  Cervicalgia  Muscle weakness (generalized)  Abnormal posture  Rationale for Evaluation and Treatment: Rehabilitation  ONSET DATE: several months per pt report  SUBJECTIVE:  SUBJECTIVE STATEMENT: Pt indicated continued complaints in Lt knee similar to last visit.  Pt indicated having pain with and without movement.  Medicine does help symptoms lessen.  Reported neck pian most noted in looking up movement.  Rotation did have improvement but still noticed.   PERTINENT HISTORY:  Rt shoulder surgery 08/2022, foot surgery  11/14/23, spinal fusion 04/2020  PAIN:  NPRS scale: 4/10 left knee, 5/10 neck  Pain description: achy mostly sharp at times, neck pain can be tingling and stabbing at times, and HA Aggravating factors: bending, squatting, standing, walking, in the knee, turning head or looking upward in the neck Relieving factors: prescription pain meds  PRECAUTIONS: None  RED FLAGS: None  WEIGHT BEARING RESTRICTIONS: No  FALLS:  Has patient fallen in last 6 months? Yes. Number of falls 10 due to bil knee buckeling  LIVING ENVIRONMENT: Lives with: lives with their family and lives with their spouse Lives in: House/apartment Stairs: Yes: External: 3 steps; on left going up Has following equipment at home: Single point cane  OCCUPATION: disabled  PLOF: Independent  PATIENT GOALS: stop hurting, walk without pain  Next MD visit:  OBJECTIVE:   DIAGNOSTIC FINDINGS:  Result Narrative: 12/02/23  AP lateral cervical spine radiographs reviewed.  No acute fracture or spondylolisthesis.  Normal lordosis.  No significant degenerative changes between the vertebral bodies or in the facet joints.  Knee - Left 05/2023 1. Oblique tear of the posterior horn-body junction of the medial meniscus extending to the inferior articular surface and extending into the posterior horn. 2. Partial-thickness cartilage loss of the medial patellar facet with cartilage fissuring and subchondral marrow edema. 3. Mild partial-thickness cartilage loss of the weight-bearing medial femorotibial compartment.     PATIENT SURVEYS: Patient-Specific Activity Scoring Scheme  0 represents "unable to perform." 10 represents "able to perform at prior level. 0 1 2 3 4 5 6 7 8 9  10 (Date and Score)   Activity Eval  12/09/23    1. squatting 6     2. Bending my knee over my other knee (crossing leg) 7     3. sleeping 6   4.sit to stand, stand to sit 7   5.    Score 6.5    Total score = sum of the activity  scores/number of activities Minimum detectable change (90%CI) for average score = 2 points Minimum detectable change (90%CI) for single activity score = 3 points    COGNITION: 12/09/2023 Overall cognitive status: Within functional limits for tasks assessed  SENSATION: 12/09/2023 Numbness noted down bilateral UE's at times especially when sleeping  POSTURE:  12/09/2023 rounded shoulders, forward head, and decreased lumbar lordosis  PALPATION: 12/09/2023 TTP: anterior left knee with mild edema noted  Bilateral cervical paraspinals and upper traps   CERVICAL ROM:   ROM AROM (deg) Eval 12/09/2023 AROM 12/27/2023  Flexion 22 pain on left side 45  Extension 18 moderate pain  40  Right lateral flexion 28 pain noted   Left lateral flexion 18 pain noted   Right rotation 24 c pain 68  Left rotation 32 c pain 65   (Blank rows = not tested)  UPPER EXTREMITY ROM:   ROM Right Eval 12/09/2023 Left Eval 12/09/2023  Shoulder flexion 160 160  Shoulder extension    Shoulder abduction    Shoulder adduction    Shoulder extension    Shoulder internal rotation    Shoulder external rotation 45 40  Elbow flexion    Elbow extension  Knee flexion    Knee extension                 (Blank rows = not tested)  UPPER EXTREMITY MMT:  MMT Right Eval 12/09/2023 Left Eval 12/09/2023  Shoulder flexion 11.3 lbs Pain at anterior shoulder at [facility] joint 33.7 lbs  Shoulder extension    Shoulder abduction    Shoulder adduction    Shoulder extension    Shoulder internal rotation    Shoulder external rotation    Middle trapezius    Lower trapezius    Elbow flexion    Elbow extension        Knee flexion 43.2 lbs 12.2 lbs Pain limiting  Knee extension 43.9 lbs 14.7 lbs              Grip strength     (Blank rows = not tested)                                                                                                                                                                                  TODAY'S TREATMENT:                                                                        DATE: 12/27/2023 Therex: Nustep lvl 5 8 mins UE/LE for ROM (back symptoms were noted in lvl 6 attempt to start) Seated quad set with SLR x 10 bilateral with cues for knee straight and slow control movement (3-4 inches of floor) Review of existing HEP and access ability through handout and website with code.  Cues for techniques given.   Neuro Re-ed: (postural awareness, muscle recruitment) Standing bilateral green band rows with scapular retraction focus 2 x 15 Standing bilateral green band GH ext 2 x 15 Seated quad set 5 sec hold x 10 bilateral    TODAY'S TREATMENT:                                                                        DATE: 12/09/23:  Therex: HEP instruction/performance c cues for techniques, handout provided.  Trial set performed of each for comprehension and symptom assessment.  See below  for exercise list Self Care: Sleeping position and using pillows for support  under bil shoulders to help with numbness during the night while sleeping on his back  PATIENT EDUCATION:  Education details: HEP, POC Person educated: Patient Education method: Explanation, Demonstration, Verbal cues, and Handouts Education comprehension: verbalized understanding, returned demonstration, and verbal cues required  HOME EXERCISE PROGRAM: Access Code: [email or web address] URL: [email or web address] Date: 12/27/2023 Prepared by: Ozell Silvan  Exercises - Supine Cervical Retraction with Towel  - 1-2 x daily - 7 x weekly - 10 reps - 5 secconds hold - Cervical Retraction at Wall  - 1-2 x daily - 7 x weekly - 1 sets - 10 reps - 5 hold - Supine Cervical Rotation Passive Unloaded  - 2 x daily - 7 x weekly - 5 reps - 5-10 seconds hold - Seated Scapular Retraction  - 3-5 x daily - 7 x weekly - 1 sets - 10 reps - 3-5 hold - Supine Quad Set  - 2 x daily - 7 x weekly - 10 reps - Seated  Quad Set (Mirrored)  - 2-3 x daily - 7 x weekly - 1 sets - 10 reps - 5 hold - Seated Long Arc Quad (Mirrored)  - 2 x daily - 7 x weekly - 2 sets - 10 reps - Seated Straight Leg Heel Taps  - 1-2 x daily - 7 x weekly - 3 sets - 10 reps  ASSESSMENT:  CLINICAL IMPRESSION: Reviewed HEP and adjusted to provide non lying down options for daily use.  Cervical range was improved in amount as compared to evaluation.  May continue to benefit from skilled PT services at this time.    OBJECTIVE IMPAIRMENTS: Abnormal gait, decreased activity tolerance, decreased mobility, difficulty walking, decreased ROM, decreased strength, impaired flexibility, postural dysfunction, and pain.   ACTIVITY LIMITATIONS: lifting, bending, sitting, standing, squatting, sleeping, stairs, and transfers  PARTICIPATION LIMITATIONS: driving, community activity, and yard work  PERSONAL FACTORS: 3+ comorbidities: see PMH above are also affecting patient's functional outcome.   REHAB POTENTIAL: Good  CLINICAL DECISION MAKING: Evolving/moderate complexity  EVALUATION COMPLEXITY: Moderate   GOALS: Goals reviewed with patient? Yes  SHORT TERM GOALS: (target date for Short term goals are 3 weeks 12/30/2023 )  1.Patient will demonstrate independent use of home exercise program to maintain progress from in clinic treatments. Goal status: on going 12/27/2023  LONG TERM GOALS: (target dates for all long term goals are  weeks  8 weeks, 02/03/2024 )   1. Patient will demonstrate/report pain at worst less than or equal to 2/10 to facilitate minimal limitation in daily activity secondary to pain symptoms. Goal status: New   2. Patient will demonstrate independent use of home exercise program to facilitate ability to maintain/progress functional gains from skilled physical therapy services. Goal status: New   3. Patient will demonstrate Patient specific functional scale avg > or = 8.5 to indicate reduced disability due to condition.   Goal status: New   4.  Patient will demonstrate bilateral cervical rotation AROM to >/= 45 degrees s symptoms to facilitate usual head movements for daily activity including driving, self care.   Goal status: New   5.   Pt will improve his Left knee MMT to >/= 20 lbs  for flexion and extension to improve functional mobility.  Goal status: New    PLAN:  PT FREQUENCY: 1-2x/week  PT DURATION: 8 weeks  Can include 02853- PT Re-evaluation, 97110-Therapeutic exercises, 97530- Therapeutic activity, [id number]- Neuromuscular re-education, 97535- Self  Care, 02859- Manual therapy, [phone]- Gait training, [phone]- Orthotic Fit/training, [phone]- Canalith repositioning, [id number]- Aquatic Therapy, [phone] Electrical stimulation (unattended), [phone]- Electrical stimulation (manual), [id number]- Vasopneumatic device, [id number]- Ultrasound, [id number] Physical performance testing, [id number]- Traction (mechanical), [phone]- Ionotophoresis 4mg /ml Dexamethasone , Patient/Family education, Balance training, Stair training, Taping, Dry Needling, Joint mobilization, Joint manipulation, Spinal manipulation, Spinal mobilization, Scar mobilization, Vestibular training, Visual/preceptual remediation/compensation, DME instructions, Cryotherapy, and Moist heat.  All performed as medically necessary.  All included unless contraindicated  PLAN FOR NEXT SESSION: Progressive non WB strengthening per referral indications for LE.      Ozell Silvan, PT, DPT, OCS, ATC 12/27/23  10:51 AM

## 2024-01-01 ENCOUNTER — Encounter: Admitting: Physical Therapy

## 2024-01-02 ENCOUNTER — Ambulatory Visit: Admitting: Physical Medicine and Rehabilitation

## 2024-01-02 ENCOUNTER — Other Ambulatory Visit: Payer: Self-pay

## 2024-01-02 VITALS — BP 135/85 | HR 80

## 2024-01-02 DIAGNOSIS — M5416 Radiculopathy, lumbar region: Secondary | ICD-10-CM

## 2024-01-02 DIAGNOSIS — M961 Postlaminectomy syndrome, not elsewhere classified: Secondary | ICD-10-CM

## 2024-01-02 MED ORDER — METHYLPREDNISOLONE ACETATE 40 MG/ML IJ SUSP
40.0000 mg | Freq: Once | INTRAMUSCULAR | Status: AC
  Administered 2024-01-02: 40 mg

## 2024-01-02 NOTE — Progress Notes (Signed)
 [name] - 48 y.o. male MRN [id number]  Date of birth: 02/12/1976  Office Visit Note: Visit Date: 01/02/2024 PCP: Garald Karlynn GAILS, MD Referred by: Georgina Ozell LABOR, MD  Subjective: Chief Complaint  Patient presents with   Lower Back - Pain   HPI:  Adam Vaughn is a 48 y.o. male who comes in today at the request of Dr. Ozell Georgina for planned Bilateral L3-4 Lumbar Transforaminal epidural steroid injection with fluoroscopic guidance.  The patient has failed conservative care including home exercise, medications, time and activity modification.  This injection will be diagnostic and hopefully therapeutic.  Please see requesting physician notes for further details and justification.   ROS Otherwise per HPI.  Assessment & Plan: Visit Diagnoses:    ICD-10-CM   1. Radiculopathy, lumbar region  M54.16 XR C-ARM NO REPORT    Epidural Steroid injection    methylPREDNISolone  acetate (DEPO-MEDROL ) injection 40 mg    2. Post laminectomy syndrome  M96.1 XR C-ARM NO REPORT    Epidural Steroid injection    methylPREDNISolone  acetate (DEPO-MEDROL ) injection 40 mg      Plan: No additional findings.   Meds & Orders:  Meds ordered this encounter  Medications   methylPREDNISolone  acetate (DEPO-MEDROL ) injection 40 mg    Orders Placed This Encounter  Procedures   XR C-ARM NO REPORT   Epidural Steroid injection    Follow-up: Return for visit to requesting provider as needed.   Procedures: No procedures performed  Lumbosacral Transforaminal Epidural Steroid Injection - Sub-Pedicular Approach with Fluoroscopic Guidance  Patient: Adam Vaughn      Date of Birth: 02/09/1976 MRN: [id number] PCP: Garald Karlynn GAILS, MD      Visit Date: 01/02/2024   Universal Protocol:    Date/Time: 01/02/2024  Consent Given By: the patient  Position: PRONE  Additional Comments: Vital signs were monitored before and after the procedure. Patient was prepped and draped in  the usual sterile fashion. The correct patient, procedure, and site was verified.   Injection Procedure Details:   Procedure diagnoses: Radiculopathy, lumbar region [M54.16]    Meds Administered:  Meds ordered this encounter  Medications   methylPREDNISolone  acetate (DEPO-MEDROL ) injection 40 mg    Laterality: Bilateral  Location/Site: L3  Needle:5.0 in., 22 ga.  Short bevel or Quincke spinal needle  Needle Placement: Transforaminal  Findings:    -Comments: Excellent flow of contrast along the nerve, nerve root and into the epidural space.  Procedure Details: After squaring off the end-plates to get a true AP view, the C-arm was positioned so that an oblique view of the foramen as noted above was visualized. The target area is just inferior to the nose of the scotty dog or sub pedicular. The soft tissues overlying this structure were infiltrated with 2-3 ml. of 1% Lidocaine  without Epinephrine .  The spinal needle was inserted toward the target using a trajectory view along the fluoroscope beam.  Under AP and lateral visualization, the needle was advanced so it did not puncture dura and was located close the 6 O'Clock position of the pedical in AP tracterory. Biplanar projections were used to confirm position. Aspiration was confirmed to be negative for CSF and/or blood. A 1-2 ml. volume of Isovue -250 was injected and flow of contrast was noted at each level. Radiographs were obtained for documentation purposes.   After attaining the desired flow of contrast documented above, a 0.5 to 1.0 ml test dose of 0.25% Marcaine  was injected into each respective transforaminal space.  The patient was observed for 90 seconds post injection.  After no sensory deficits were reported, and normal lower extremity motor function was noted,   the above injectate was administered so that equal amounts of the injectate were placed at each foramen (level) into the transforaminal epidural  space.   Additional Comments:  The patient tolerated the procedure well Dressing: 2 x 2 sterile gauze and Band-Aid    Post-procedure details: Patient was observed during the procedure. Post-procedure instructions were reviewed.  Patient left the clinic in stable condition.    Clinical History: EXAM: MRI LUMBAR SPINE WITHOUT CONTRAST   TECHNIQUE: Multiplanar, multisequence MR imaging of the lumbar spine was performed. No intravenous contrast was administered.   COMPARISON:  01/18/2021   FINDINGS: Segmentation:  Standard.   Alignment:  2 mm retrolisthesis of L3 on L4.   Vertebrae: No acute fracture, evidence of discitis, or aggressive bone lesion. 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   Conus medullaris and cauda equina: Conus extends to the T12-L1 level. Conus and cauda equina appear normal.   Paraspinal and other soft tissues: No acute paraspinal abnormality. Postsurgical changes in the posterior paraspinal soft tissues at L4-S1.   Disc levels:   Disc spaces: Posterior lumbar interbody fusion from L4 through S1.   T12-L1: No significant disc bulge. No neural foraminal stenosis. No central canal stenosis.   L1-L2: No significant disc bulge. No neural foraminal stenosis. No central canal stenosis.   L2-L3: Minimal broad-based disc bulge. No left foraminal stenosis. Mild right foraminal stenosis. No spinal stenosis.   L3-L4: Broad-based disc bulge flattening ventral thecal sac. Mild bilateral facet arthropathy. Mild bilateral foraminal stenosis. Mild spinal stenosis. Bilateral lateral recess stenosis.   L4-L5: Interbody fusion.  No foraminal or central canal stenosis.   L5-S1: Interbody fusion.  No foraminal or central canal stenosis.   IMPRESSION: 1. At L3-4 there is a broad-based disc bulge flattening ventral thecal sac. Mild bilateral facet arthropathy. Mild bilateral foraminal stenosis. Mild spinal stenosis. Bilateral lateral  recess stenosis. 2. Posterior lumbar interbody fusion from L4 through S1 without foraminal or central canal stenosis. 3. No acute osseous injury of the lumbar spine.     Electronically Signed   By: Julaine Blanch M.D.   On: 05/22/2022 10:20     Objective:  VS:  HT:    WT:   BMI:     BP:   HR: bpm  TEMP: ( )  RESP:  Physical Exam Vitals and nursing note reviewed.  Constitutional:      General: He is not in acute distress.    Appearance: Normal appearance. He is not ill-appearing.  HENT:     Head: Normocephalic and atraumatic.     Right Ear: External ear normal.     Left Ear: External ear normal.     Nose: No congestion.  Eyes:     Extraocular Movements: Extraocular movements intact.  Cardiovascular:     Rate and Rhythm: Normal rate.     Pulses: Normal pulses.  Pulmonary:     Effort: Pulmonary effort is normal. No respiratory distress.  Abdominal:     General: There is no distension.     Palpations: Abdomen is soft.  Musculoskeletal:        General: No tenderness or signs of injury.     Cervical back: Neck supple.     Right lower leg: No edema.     Left lower leg: No edema.     Comments: Patient  has good distal strength without clonus.  Skin:    Findings: No erythema or rash.  Neurological:     General: No focal deficit present.     Mental Status: He is alert and oriented to person, place, and time.     Sensory: No sensory deficit.     Motor: No weakness or abnormal muscle tone.     Coordination: Coordination normal.  Psychiatric:        Mood and Affect: Mood normal.        Behavior: Behavior normal.      Imaging: No results found.

## 2024-01-02 NOTE — Patient Instructions (Signed)

## 2024-01-02 NOTE — Progress Notes (Signed)
 Pain Scale   Average Pain 7 Patient advising he has chronic lower back pain radiating bilaterally to legs. Patient advising int the past 3 weeks he  is unable to get any relief.        +Driver, -BT, -Dye Allergies.

## 2024-01-02 NOTE — Procedures (Signed)
 Lumbosacral Transforaminal Epidural Steroid Injection - Sub-Pedicular Approach with Fluoroscopic Guidance  Patient: Adam Vaughn      Date of Birth: 1975/08/11 MRN: [id number] PCP: Garald Karlynn GAILS, MD      Visit Date: 01/02/2024   Universal Protocol:    Date/Time: 01/02/2024  Consent Given By: the patient  Position: PRONE  Additional Comments: Vital signs were monitored before and after the procedure. Patient was prepped and draped in the usual sterile fashion. The correct patient, procedure, and site was verified.   Injection Procedure Details:   Procedure diagnoses: Radiculopathy, lumbar region [M54.16]    Meds Administered:  Meds ordered this encounter  Medications   methylPREDNISolone  acetate (DEPO-MEDROL ) injection 40 mg    Laterality: Bilateral  Location/Site: L3  Needle:5.0 in., 22 ga.  Short bevel or Quincke spinal needle  Needle Placement: Transforaminal  Findings:    -Comments: Excellent flow of contrast along the nerve, nerve root and into the epidural space.  Procedure Details: After squaring off the end-plates to get a true AP view, the C-arm was positioned so that an oblique view of the foramen as noted above was visualized. The target area is just inferior to the nose of the scotty dog or sub pedicular. The soft tissues overlying this structure were infiltrated with 2-3 ml. of 1% Lidocaine  without Epinephrine .  The spinal needle was inserted toward the target using a trajectory view along the fluoroscope beam.  Under AP and lateral visualization, the needle was advanced so it did not puncture dura and was located close the 6 O'Clock position of the pedical in AP tracterory. Biplanar projections were used to confirm position. Aspiration was confirmed to be negative for CSF and/or blood. A 1-2 ml. volume of Isovue -250 was injected and flow of contrast was noted at each level. Radiographs were obtained for documentation purposes.   After  attaining the desired flow of contrast documented above, a 0.5 to 1.0 ml test dose of 0.25% Marcaine  was injected into each respective transforaminal space.  The patient was observed for 90 seconds post injection.  After no sensory deficits were reported, and normal lower extremity motor function was noted,   the above injectate was administered so that equal amounts of the injectate were placed at each foramen (level) into the transforaminal epidural space.   Additional Comments:  The patient tolerated the procedure well Dressing: 2 x 2 sterile gauze and Band-Aid    Post-procedure details: Patient was observed during the procedure. Post-procedure instructions were reviewed.  Patient left the clinic in stable condition.

## 2024-01-07 ENCOUNTER — Ambulatory Visit: Admitting: Physical Therapy

## 2024-01-07 ENCOUNTER — Encounter: Payer: Self-pay | Admitting: Physical Therapy

## 2024-01-07 DIAGNOSIS — R262 Difficulty in walking, not elsewhere classified: Secondary | ICD-10-CM

## 2024-01-07 DIAGNOSIS — M25562 Pain in left knee: Secondary | ICD-10-CM

## 2024-01-07 DIAGNOSIS — R293 Abnormal posture: Secondary | ICD-10-CM | POA: Diagnosis not present

## 2024-01-07 DIAGNOSIS — M542 Cervicalgia: Secondary | ICD-10-CM | POA: Diagnosis not present

## 2024-01-07 DIAGNOSIS — M6281 Muscle weakness (generalized): Secondary | ICD-10-CM | POA: Diagnosis not present

## 2024-01-07 NOTE — Therapy (Signed)
 OUTPATIENT PHYSICAL THERAPY TREATMENT   Patient Name: Adam Vaughn MRN: [id number] DOB:October 07, 1975, 48 y.o., male Today's Date: 01/07/2024  END OF SESSION:  PT End of Session - 01/07/24 1325     Visit Number 3    Number of Visits 16    Date for PT Re-Evaluation 02/07/24    Authorization Type [employer]    Progress Note Due on Visit 10    PT Start Time 1303    PT Stop Time 1346    PT Time Calculation (min) 43 min    Activity Tolerance Patient limited by pain    Behavior During Therapy WFL for tasks assessed/performed            Past Medical History:  Diagnosis Date   Arthritis    Chronic back pain    Family history of colon cancer    sister   GERD (gastroesophageal reflux disease)    History of kidney stones    Hyperlipidemia    Hypertension    Past Surgical History:  Procedure Laterality Date   BACK SURGERY  04/11/2020   CHOLECYSTECTOMY N/A 10/01/2020   Procedure: LAPAROSCOPIC CHOLECYSTECTOMY;  Surgeon: Curvin Deward MOULD, MD;  Location: [facility];  Service: General;  Laterality: N/A;   COLONOSCOPY  1991   IBS   endocolon  09/15/2021   [facility], GERD   HAND SURGERY Bilateral    SHOULDER SURGERY Right 08/2021   SPINAL CORD STIMULATOR IMPLANT  2022   SPINAL CORD STIMULATOR REMOVAL  2022   TONSILLECTOMY     VASECTOMY     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   Chronic eczematous otitis externa of both ears 05/16/2022   Hypertrophy of inferior nasal turbinate 05/16/2022   Situational depression 05/14/2022   Coronary atherosclerosis 03/26/2022   Dyslipidemia 01/11/2022   Chronic pain syndrome 11/07/2021   Piriformis syndrome 11/07/2021   Bladder neck obstruction 09/04/2021   Asthmatic bronchitis 09/04/2021   Pain in right shoulder  06/29/2021   Meteorism 06/01/2021   Sinus congestion 02/28/2021   Vertigo 02/17/2021   Headache 02/17/2021   Fever 02/17/2021   Falls 02/17/2021   Allergic rhinitis 10/25/2020   Family history of acute polio 10/25/2020   Chest pain, atypical 10/10/2020   S/P laparoscopic cholecystectomy 10/10/2020   Hyperglycemia 10/10/2020   Acute cholecystitis 09/30/2020   Insomnia 09/26/2020   Knee pain 06/23/2020   Low vitamin B12 level 05/31/2020   Ankle sprain 05/31/2020   Constipation 05/31/2020   Degenerative spondylolisthesis 04/11/2020   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   DDD (degenerative disc disease), lumbar 01/20/2020   Left lumbar radiculitis 01/20/2020   Vitamin D  deficiency 01/15/2020   Low back pain 01/12/2020   IBS (irritable bowel syndrome) 01/12/2020   Nephrolithiasis 01/12/2020   Erectile dysfunction 01/12/2020   GERD (gastroesophageal reflux disease) 01/12/2020    PCP: Garald Karlynn GAILS, MD   REFERRING PROVIDER: Addie Cordella Hamilton, MD   REFERRING DIAG:  Diagnosis  M25.562 (ICD-10-CM) - Acute pain of left knee    THERAPY DIAG:  Acute pain of left knee  Difficulty in walking, not elsewhere classified  Cervicalgia  Muscle weakness (generalized)  Abnormal posture  Rationale for Evaluation and Treatment: Rehabilitation  ONSET DATE: several months per pt report  SUBJECTIVE:  SUBJECTIVE STATEMENT: Pt arriving today reporting 5/10 pain in his left knee. Pt reporting 3/10 pain with looking straight ahead. When pt looks up his pain increases to 7/10. Pt stating his back  injection  from 01/02/24 has helped.   PERTINENT HISTORY:  Rt shoulder surgery 08/2022, foot surgery 11/14/23, spinal fusion 04/2020  PAIN:  NPRS scale:  5/10 left knee, 7/10 neck with rotation or looking upward  Pain description: achy mostly sharp at times, neck pain can be tingling and stabbing at times, and HA Aggravating factors: bending, squatting, standing, walking, in the knee, turning head or looking upward in the neck Relieving factors: prescription pain meds  PRECAUTIONS: None  RED FLAGS: None  WEIGHT BEARING RESTRICTIONS: No  FALLS:  Has patient fallen in last 6 months? Yes. Number of falls 10 due to bil knee buckeling  LIVING ENVIRONMENT: Lives with: lives with their family and lives with their spouse Lives in: House/apartment Stairs: Yes: External: 3 steps; on left going up Has following equipment at home: Single point cane  OCCUPATION: disabled  PLOF: Independent  PATIENT GOALS: stop hurting, walk without pain  Next MD visit:  OBJECTIVE:   DIAGNOSTIC FINDINGS:  Result Narrative: 12/02/23  AP lateral cervical spine radiographs reviewed.  No acute fracture or spondylolisthesis.  Normal lordosis.  No significant degenerative changes between the vertebral bodies or in the facet joints.  Knee - Left 05/2023 1. Oblique tear of the posterior horn-body junction of the medial meniscus extending to the inferior articular surface and extending into the posterior horn. 2. Partial-thickness cartilage loss of the medial patellar facet with cartilage fissuring and subchondral marrow edema. 3. Mild partial-thickness cartilage loss of the weight-bearing medial femorotibial compartment.     PATIENT SURVEYS: Patient-Specific Activity Scoring Scheme  0 represents "unable to perform." 10 represents "able to perform at prior level. 0 1 2 3 4 5 6 7 8 9  10 (Date and Score)   Activity Eval  12/09/23    1. squatting 6     2. Bending my knee over my other knee (crossing leg) 7     3. sleeping 6   4.sit to stand, stand to sit 7   5.    Score 6.5    Total score = sum of the activity scores/number of  activities Minimum detectable change (90%CI) for average score = 2 points Minimum detectable change (90%CI) for single activity score = 3 points    COGNITION: 12/09/2023 Overall cognitive status: Within functional limits for tasks assessed  SENSATION: 12/09/2023 Numbness noted down bilateral UE's at times especially when sleeping  POSTURE:  12/09/2023 rounded shoulders, forward head, and decreased lumbar lordosis  PALPATION: 12/09/2023 TTP: anterior left knee with mild edema noted  Bilateral cervical paraspinals and upper traps   CERVICAL ROM:   ROM AROM (deg) Eval 12/09/2023 AROM 12/27/2023  Flexion 22 pain on left side 45  Extension 18 moderate pain  40  Right lateral flexion 28 pain noted   Left lateral flexion 18 pain noted   Right rotation 24 c pain 68  Left rotation 32 c pain 65   (Blank rows = not tested)  UPPER EXTREMITY ROM:   ROM Right Eval 12/09/2023 Left Eval 12/09/2023  Shoulder flexion 160 160  Shoulder extension    Shoulder abduction    Shoulder adduction    Shoulder extension    Shoulder internal rotation    Shoulder external rotation 45 40  Elbow flexion    Elbow extension  Knee flexion    Knee extension                 (Blank rows = not tested)  UPPER EXTREMITY MMT:  MMT Right Eval 12/09/2023 Left Eval 12/09/2023  Shoulder flexion 11.3 lbs Pain at anterior shoulder at [facility] joint 33.7 lbs  Shoulder extension    Shoulder abduction    Shoulder adduction    Shoulder extension    Shoulder internal rotation    Shoulder external rotation    Middle trapezius    Lower trapezius    Elbow flexion    Elbow extension        Knee flexion 43.2 lbs 12.2 lbs Pain limiting  Knee extension 43.9 lbs 14.7 lbs              Grip strength     (Blank rows = not tested)                                                                                                                                                                                  TODAY'S TREATMENT:                                                                        DATE: 01/07/2024 Therex: Scifit bike: level 2 x 6 minutes Seated LAQ: 2 x 10, 3 #   Seated SLR: 2 x 10  Seated upper trap stretch x 3 bil holding 10 sec Supine cervical rotation x 5 bil holding 5 sec Supine trunk rotation: x 3 holding 20 sec Supine bridge:  x 10 c initial core activation Supine marching x 20 alternating Modified dead bug x 20  Neuro Re-ed: (postural awareness, muscle recruitment) Seated cervical retraction x 10 holding 5 sec Shoulder blade squeezes 2 x 10 , holding 5 sec Manual : Cervical distraction and occipital release, gentle PA grade 2-3 cervical mobs Moist heat following x 3 minutes (non billable)     TODAY'S TREATMENT:                                                                        DATE: 12/27/2023 Therex: Nustep lvl 5  8 mins UE/LE for ROM (back symptoms were noted in lvl 6 attempt to start) Seated quad set with SLR x 10 bilateral with cues for knee straight and slow control movement (3-4 inches of floor) Review of existing HEP and access ability through handout and website with code.  Cues for techniques given.   Neuro Re-ed: (postural awareness, muscle recruitment) Standing bilateral green band rows with scapular retraction focus 2 x 15 Standing bilateral green band GH ext 2 x 15 Seated quad set 5 sec hold x 10 bilateral    TODAY'S TREATMENT:                                                                        DATE: 12/09/23:  Therex: HEP instruction/performance c cues for techniques, handout provided.  Trial set performed of each for comprehension and symptom assessment.  See below for exercise list Self Care: Sleeping position and using pillows for support  under bil shoulders to help with numbness during the night while sleeping on his back   PATIENT EDUCATION:  Education details: HEP, POC Person educated: Patient Education method: Explanation,  Demonstration, Verbal cues, and Handouts Education comprehension: verbalized understanding, returned demonstration, and verbal cues required  HOME EXERCISE PROGRAM: Access Code: [email or web address] URL: [email or web address] Date: 12/27/2023 Prepared by: Ozell Silvan  Exercises - Supine Cervical Retraction with Towel  - 1-2 x daily - 7 x weekly - 10 reps - 5 secconds hold - Cervical Retraction at Wall  - 1-2 x daily - 7 x weekly - 1 sets - 10 reps - 5 hold - Supine Cervical Rotation Passive Unloaded  - 2 x daily - 7 x weekly - 5 reps - 5-10 seconds hold - Seated Scapular Retraction  - 3-5 x daily - 7 x weekly - 1 sets - 10 reps - 3-5 hold - Supine Quad Set  - 2 x daily - 7 x weekly - 10 reps - Seated Quad Set (Mirrored)  - 2-3 x daily - 7 x weekly - 1 sets - 10 reps - 5 hold - Seated Long Arc Quad (Mirrored)  - 2 x daily - 7 x weekly - 2 sets - 10 reps - Seated Straight Leg Heel Taps  - 1-2 x daily - 7 x weekly - 3 sets - 10 reps  ASSESSMENT:  CLINICAL IMPRESSION: Treatment today focusing on LE strengthening and cervical ROM. Recommending continued skilled PT interventions toward goals set.    OBJECTIVE IMPAIRMENTS: Abnormal gait, decreased activity tolerance, decreased mobility, difficulty walking, decreased ROM, decreased strength, impaired flexibility, postural dysfunction, and pain.   ACTIVITY LIMITATIONS: lifting, bending, sitting, standing, squatting, sleeping, stairs, and transfers  PARTICIPATION LIMITATIONS: driving, community activity, and yard work  PERSONAL FACTORS: 3+ comorbidities: see PMH above are also affecting patient's functional outcome.   REHAB POTENTIAL: Good  CLINICAL DECISION MAKING: Evolving/moderate complexity  EVALUATION COMPLEXITY: Moderate   GOALS: Goals reviewed with patient? Yes  SHORT TERM GOALS: (target date for Short term goals are 3 weeks 12/30/2023 )  1.Patient will demonstrate independent use of home exercise program to maintain  progress from in clinic treatments. Goal status: Met 01/07/24  LONG TERM GOALS: (target dates for all long term goals are  weeks  8 weeks, 02/03/2024 )  1. Patient will demonstrate/report pain at worst less than or equal to 2/10 to facilitate minimal limitation in daily activity secondary to pain symptoms. Goal status: New   2. Patient will demonstrate independent use of home exercise program to facilitate ability to maintain/progress functional gains from skilled physical therapy services. Goal status: New   3. Patient will demonstrate Patient specific functional scale avg > or = 8.5 to indicate reduced disability due to condition.  Goal status: New   4.  Patient will demonstrate bilateral cervical rotation AROM to >/= 45 degrees s symptoms to facilitate usual head movements for daily activity including driving, self care.   Goal status: New   5.   Pt will improve his Left knee MMT to >/= 20 lbs  for flexion and extension to improve functional mobility.  Goal status: New    PLAN:  PT FREQUENCY: 1-2x/week  PT DURATION: 8 weeks  Can include 02853- PT Re-evaluation, 97110-Therapeutic exercises, 97530- Therapeutic activity, 97112- Neuromuscular re-education, [phone]- Self Care, 97140- Manual therapy, [phone]- Gait training, [phone]- Orthotic Fit/training, [phone]- Canalith repositioning, [id number]- Aquatic Therapy, [phone] Electrical stimulation (unattended), [phone]- Electrical stimulation (manual), [id number]- Vasopneumatic device, [id number]- Ultrasound, [id number] Physical performance testing, [id number]- Traction (mechanical), [id number]- Ionotophoresis 4mg /ml Dexamethasone , Patient/Family education, Balance training, Stair training, Taping, Dry Needling, Joint mobilization, Joint manipulation, Spinal manipulation, Spinal mobilization, Scar mobilization, Vestibular training, Visual/preceptual remediation/compensation, DME instructions, Cryotherapy, and Moist heat.  All performed as medically necessary.  All included unless  contraindicated  PLAN FOR NEXT SESSION: Progressive non WB strengthening per referral indications for LE, cervical and lumbar interventions as needed      Delon Lunger, PT, MPT 01/07/24 1:47 PM   01/07/24  1:47 PM

## 2024-01-10 ENCOUNTER — Encounter: Payer: Self-pay | Admitting: Internal Medicine

## 2024-01-10 ENCOUNTER — Other Ambulatory Visit: Payer: Self-pay | Admitting: Internal Medicine

## 2024-01-10 MED ORDER — OXYCODONE HCL 10 MG PO TABS
10.0000 mg | ORAL_TABLET | Freq: Three times a day (TID) | ORAL | 0 refills | Status: DC | PRN
Start: 2024-01-10 — End: 2024-02-11

## 2024-01-11 ENCOUNTER — Encounter: Payer: Self-pay | Admitting: Physical Medicine and Rehabilitation

## 2024-01-14 ENCOUNTER — Encounter: Payer: Self-pay | Admitting: Physical Therapy

## 2024-01-14 ENCOUNTER — Ambulatory Visit: Admitting: Physical Therapy

## 2024-01-14 DIAGNOSIS — M542 Cervicalgia: Secondary | ICD-10-CM

## 2024-01-14 DIAGNOSIS — M6281 Muscle weakness (generalized): Secondary | ICD-10-CM | POA: Diagnosis not present

## 2024-01-14 DIAGNOSIS — M25562 Pain in left knee: Secondary | ICD-10-CM | POA: Diagnosis not present

## 2024-01-14 DIAGNOSIS — R293 Abnormal posture: Secondary | ICD-10-CM | POA: Diagnosis not present

## 2024-01-14 DIAGNOSIS — R262 Difficulty in walking, not elsewhere classified: Secondary | ICD-10-CM | POA: Diagnosis not present

## 2024-01-14 NOTE — Therapy (Signed)
 OUTPATIENT PHYSICAL THERAPY TREATMENT   Patient Name: Adam Vaughn MRN: [id number] DOB:1975/11/14, 48 y.o., male Today's Date: 01/14/2024  END OF SESSION:  PT End of Session - 01/14/24 1120     Visit Number 4    Number of Visits 16    Date for PT Re-Evaluation 02/07/24    Authorization Type [employer]    Progress Note Due on Visit 10    PT Start Time 1107    PT Stop Time 1145    PT Time Calculation (min) 38 min    Activity Tolerance Patient limited by pain    Behavior During Therapy WFL for tasks assessed/performed             Past Medical History:  Diagnosis Date   Arthritis    Chronic back pain    Family history of colon cancer    sister   GERD (gastroesophageal reflux disease)    History of kidney stones    Hyperlipidemia    Hypertension    Past Surgical History:  Procedure Laterality Date   BACK SURGERY  04/11/2020   CHOLECYSTECTOMY N/A 10/01/2020   Procedure: LAPAROSCOPIC CHOLECYSTECTOMY;  Surgeon: Curvin Deward MOULD, MD;  Location: [facility];  Service: General;  Laterality: N/A;   COLONOSCOPY  1991   IBS   endocolon  09/15/2021   [facility], GERD   HAND SURGERY Bilateral    SHOULDER SURGERY Right 08/2021   SPINAL CORD STIMULATOR IMPLANT  2022   SPINAL CORD STIMULATOR REMOVAL  2022   TONSILLECTOMY     VASECTOMY     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   Chronic eczematous otitis externa of both ears 05/16/2022   Hypertrophy of inferior nasal turbinate 05/16/2022   Situational depression 05/14/2022   Coronary atherosclerosis 03/26/2022   Dyslipidemia 01/11/2022   Chronic pain syndrome 11/07/2021   Piriformis syndrome 11/07/2021   Bladder neck obstruction 09/04/2021   Asthmatic bronchitis 09/04/2021   Pain in right shoulder  06/29/2021   Meteorism 06/01/2021   Sinus congestion 02/28/2021   Vertigo 02/17/2021   Headache 02/17/2021   Fever 02/17/2021   Falls 02/17/2021   Allergic rhinitis 10/25/2020   Family history of acute polio 10/25/2020   Chest pain, atypical 10/10/2020   S/P laparoscopic cholecystectomy 10/10/2020   Hyperglycemia 10/10/2020   Acute cholecystitis 09/30/2020   Insomnia 09/26/2020   Knee pain 06/23/2020   Low vitamin B12 level 05/31/2020   Ankle sprain 05/31/2020   Constipation 05/31/2020   Degenerative spondylolisthesis 04/11/2020   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   DDD (degenerative disc disease), lumbar 01/20/2020   Left lumbar radiculitis 01/20/2020   Vitamin D  deficiency 01/15/2020   Low back pain 01/12/2020   IBS (irritable bowel syndrome) 01/12/2020   Nephrolithiasis 01/12/2020   Erectile dysfunction 01/12/2020   GERD (gastroesophageal reflux disease) 01/12/2020    PCP: Garald Karlynn GAILS, MD   REFERRING PROVIDER: Addie Cordella Hamilton, MD   REFERRING DIAG:  Diagnosis  M25.562 (ICD-10-CM) - Acute pain of left knee    THERAPY DIAG:  Acute pain of left knee  Difficulty in walking, not elsewhere classified  Cervicalgia  Muscle weakness (generalized)  Abnormal posture  Rationale for Evaluation and Treatment: Rehabilitation  ONSET DATE: several months per pt report  SUBJECTIVE:                                                                                                                                                                                                         SUBJECTIVE STATEMENT: Pt arriving today with 7/10 down his left knee. Pt stating when he flexes his neck it causes pain down into his low back.   PERTINENT HISTORY:  Rt shoulder surgery 08/2022, foot surgery 11/14/23, spinal fusion 04/2020  PAIN:  NPRS scale: see above  Pain description: achy mostly sharp at times, neck pain can be tingling  and stabbing at times, and HA Aggravating factors: bending, squatting, standing, walking, in the knee, turning head or looking upward in the neck Relieving factors: prescription pain meds  PRECAUTIONS: None  RED FLAGS: None  WEIGHT BEARING RESTRICTIONS: No  FALLS:  Has patient fallen in last 6 months? Yes. Number of falls 10 due to bil knee buckeling  LIVING ENVIRONMENT: Lives with: lives with their family and lives with their spouse Lives in: House/apartment Stairs: Yes: External: 3 steps; on left going up Has following equipment at home: Single point cane  OCCUPATION: disabled  PLOF: Independent  PATIENT GOALS: stop hurting, walk without pain  Next MD visit:  OBJECTIVE:   DIAGNOSTIC FINDINGS:  Result Narrative: 12/02/23  AP lateral cervical spine radiographs reviewed.  No acute fracture or spondylolisthesis.  Normal lordosis.  No significant degenerative changes between the vertebral bodies or in the facet joints.  Knee - Left 05/2023 1. Oblique tear of the posterior horn-body junction of the medial meniscus extending to the inferior articular surface and extending into the posterior horn. 2. Partial-thickness cartilage loss of the medial patellar facet with cartilage fissuring and subchondral marrow edema. 3. Mild partial-thickness cartilage loss of the weight-bearing medial femorotibial compartment.     PATIENT SURVEYS: Patient-Specific Activity Scoring Scheme  0 represents "unable to perform." 10 represents "able to perform at prior level. 0 1 2 3 4 5 6 7 8 9  10 (Date and Score)   Activity Eval  12/09/23 01/14/24   1. squatting 6   2  2. Bending my knee over my other knee (crossing leg) 7   4  3. sleeping 6 3  4.sit to stand, stand to sit 7 6  5.    Score 6.5 3.75   Total score = sum of the activity scores/number of activities Minimum detectable change (90%CI) for average score = 2 points Minimum detectable change (90%CI) for single activity score  = 3 points    COGNITION: 12/09/2023  Overall cognitive status: Within functional limits for tasks assessed  SENSATION: 12/09/2023 Numbness noted down bilateral UE's at times especially when sleeping  POSTURE:  12/09/2023 rounded shoulders, forward head, and decreased lumbar lordosis  PALPATION: 12/09/2023 TTP: anterior left knee with mild edema noted  Bilateral cervical paraspinals and upper traps   CERVICAL ROM:   ROM AROM (deg) Eval 12/09/2023 AROM 12/27/2023  Flexion 22 pain on left side 45  Extension 18 moderate pain  40  Right lateral flexion 28 pain noted   Left lateral flexion 18 pain noted   Right rotation 24 c pain 68  Left rotation 32 c pain 65   (Blank rows = not tested)  UPPER EXTREMITY ROM:   ROM Right Eval 12/09/2023 Left Eval 12/09/2023  Shoulder flexion 160 160  Shoulder extension    Shoulder abduction    Shoulder adduction    Shoulder extension    Shoulder internal rotation    Shoulder external rotation 45 40  Elbow flexion    Elbow extension        Knee flexion    Knee extension                 (Blank rows = not tested)  UPPER EXTREMITY MMT:  MMT Right Eval 12/09/2023 Left Eval 12/09/2023  Shoulder flexion 11.3 lbs Pain at anterior shoulder at [facility] joint 33.7 lbs  Shoulder extension    Shoulder abduction    Shoulder adduction    Shoulder extension    Shoulder internal rotation    Shoulder external rotation    Middle trapezius    Lower trapezius    Elbow flexion    Elbow extension        Knee flexion 43.2 lbs 12.2 lbs Pain limiting  Knee extension 43.9 lbs 14.7 lbs              Grip strength     (Blank rows = not tested)                                                                                                                                                                                 TODAY'S TREATMENT:                                                                        DATE: 01/14/2024 Therex: Nustep: level 5  x  8 minutes Standing hip extension  2 x 10 bil  LE c bil UE support Standing hip abduction 2 x 10 c bil UE support Standing hip flexion 2 x 10 c bil UE support Trunk rotation x 3 bil holding 20 sec Neuro Re-ed: (postural awareness, muscle recruitment) Wt shifting forward and back on each LE x 10 c UE support Reciprocal knee flexion/extension x 20 sitting Abdominal activation x 10 holding 10 sec in supine        TODAY'S TREATMENT:                                                                        DATE: 01/07/2024 Therex: Scifit bike: level 2 x 6 minutes Seated LAQ: 2 x 10, 3 #   Seated SLR: 2 x 10  Seated upper trap stretch x 3 bil holding 10 sec Supine cervical rotation x 5 bil holding 5 sec Supine trunk rotation: x 3 holding 20 sec Supine bridge:  x 10 c initial core activation Supine marching x 20 alternating Modified dead bug x 20  Neuro Re-ed: (postural awareness, muscle recruitment) Seated cervical retraction x 10 holding 5 sec Shoulder blade squeezes 2 x 10 , holding 5 sec Manual : Cervical distraction and occipital release, gentle PA grade 2-3 cervical mobs Moist heat following x 3 minutes (non billable)     TODAY'S TREATMENT:                                                                        DATE: 12/27/2023 Therex: Nustep lvl 5 8 mins UE/LE for ROM (back symptoms were noted in lvl 6 attempt to start) Seated quad set with SLR x 10 bilateral with cues for knee straight and slow control movement (3-4 inches of floor) Review of existing HEP and access ability through handout and website with code.  Cues for techniques given.   Neuro Re-ed: (postural awareness, muscle recruitment) Standing bilateral green band rows with scapular retraction focus 2 x 15 Standing bilateral green band GH ext 2 x 15 Seated quad set 5 sec hold x 10 bilateral    TODAY'S TREATMENT:                                                                        DATE: 12/09/23:  Therex: HEP  instruction/performance c cues for techniques, handout provided.  Trial set performed of each for comprehension and symptom assessment.  See below for exercise list Self Care: Sleeping position and using pillows for support  under bil shoulders to help with numbness during the night while sleeping on his back   PATIENT EDUCATION:  Education details: HEP, POC Person educated: Patient Education method: Explanation, Demonstration, Verbal cues, and Handouts Education comprehension: verbalized understanding, returned demonstration, and verbal cues required  HOME EXERCISE PROGRAM: Access Code: [email or web address] URL: [email or web address] Date: 12/27/2023 Prepared by: Ozell Silvan  Exercises - Supine Cervical Retraction with Towel  - 1-2 x daily - 7 x weekly - 10 reps - 5 secconds hold - Cervical Retraction at Wall  - 1-2 x daily - 7 x weekly - 1 sets - 10 reps - 5 hold - Supine Cervical Rotation Passive Unloaded  - 2 x daily - 7 x weekly - 5 reps - 5-10 seconds hold - Seated Scapular Retraction  - 3-5 x daily - 7 x weekly - 1 sets - 10 reps - 3-5 hold - Supine Quad Set  - 2 x daily - 7 x weekly - 10 reps - Seated Quad Set (Mirrored)  - 2-3 x daily - 7 x weekly - 1 sets - 10 reps - 5 hold - Seated Long Arc Quad (Mirrored)  - 2 x daily - 7 x weekly - 2 sets - 10 reps - Seated Straight Leg Heel Taps  - 1-2 x daily - 7 x weekly - 3 sets - 10 reps  ASSESSMENT:  CLINICAL IMPRESSION: Pt arriving today reporting 7/10 pain in his low back and down his left LE. Pt rpeorting numbness and tingling. Pt amb into clinic today with straight cane. Pt had trouble standing following sitting on the Nustep for 8 minutes, requiring minimal assistance to prevent fall. Pt stating his left LE was numb and pt was able to recover with close supervision. Pt stating today is a bad flare up. Pt stating some days are much better than others. Recommending continued skilled PT to pt's tolerance.    OBJECTIVE  IMPAIRMENTS: Abnormal gait, decreased activity tolerance, decreased mobility, difficulty walking, decreased ROM, decreased strength, impaired flexibility, postural dysfunction, and pain.   ACTIVITY LIMITATIONS: lifting, bending, sitting, standing, squatting, sleeping, stairs, and transfers  PARTICIPATION LIMITATIONS: driving, community activity, and yard work  PERSONAL FACTORS: 3+ comorbidities: see PMH above are also affecting patient's functional outcome.   REHAB POTENTIAL: Good  CLINICAL DECISION MAKING: Evolving/moderate complexity  EVALUATION COMPLEXITY: Moderate   GOALS: Goals reviewed with patient? Yes  SHORT TERM GOALS: (target date for Short term goals are 3 weeks 12/30/2023 )  1.Patient will demonstrate independent use of home exercise program to maintain progress from in clinic treatments. Goal status: Met 01/07/24  LONG TERM GOALS: (target dates for all long term goals are  weeks  8 weeks, 02/03/2024 )   1. Patient will demonstrate/report pain at worst less than or equal to 2/10 to facilitate minimal limitation in daily activity secondary to pain symptoms. Goal status: New   2. Patient will demonstrate independent use of home exercise program to facilitate ability to maintain/progress functional gains from skilled physical therapy services. Goal status: New   3. Patient will demonstrate Patient specific functional scale avg > or = 8.5 to indicate reduced disability due to condition.  Goal status: New   4.  Patient will demonstrate bilateral cervical rotation AROM to >/= 45 degrees s symptoms to facilitate usual head movements for daily activity including driving, self care.   Goal status: New   5.   Pt will improve his Left knee MMT to >/= 20 lbs  for flexion and extension to improve functional mobility.  Goal status: New    PLAN:  PT FREQUENCY: 1-2x/week  PT DURATION: 8 weeks  Can include 02853- PT Re-evaluation, 97110-Therapeutic exercises, 97530- Therapeutic  activity, [id number]- Neuromuscular re-education, 97535- Self Care, [id number]- Manual therapy, [id number]- Gait training, [phone]- Orthotic  Fit/training, 04007- Canalith repositioning, 02886- Aquatic Therapy, [id number] Electrical stimulation (unattended), [id number]- Electrical stimulation (manual), [id number]- Vasopneumatic device, [id number]- Ultrasound, [id number] Physical performance testing, 02987- Traction (mechanical), [id number]- Ionotophoresis 4mg /ml Dexamethasone , Patient/Family education, Balance training, Stair training, Taping, Dry Needling, Joint mobilization, Joint manipulation, Spinal manipulation, Spinal mobilization, Scar mobilization, Vestibular training, Visual/preceptual remediation/compensation, DME instructions, Cryotherapy, and Moist heat.  All performed as medically necessary.  All included unless contraindicated  PLAN FOR NEXT SESSION: Progressive non WB strengthening per referral indications for LE, cervical and lumbar interventions as needed      Delon Lunger, PT, MPT 01/14/24 11:39 AM   01/14/24  11:39 AM

## 2024-01-17 ENCOUNTER — Ambulatory Visit: Admitting: Physical Medicine and Rehabilitation

## 2024-01-17 DIAGNOSIS — M25511 Pain in right shoulder: Secondary | ICD-10-CM

## 2024-01-17 DIAGNOSIS — M79641 Pain in right hand: Secondary | ICD-10-CM | POA: Diagnosis not present

## 2024-01-17 DIAGNOSIS — G894 Chronic pain syndrome: Secondary | ICD-10-CM | POA: Diagnosis not present

## 2024-01-17 DIAGNOSIS — G8929 Other chronic pain: Secondary | ICD-10-CM

## 2024-01-17 DIAGNOSIS — R202 Paresthesia of skin: Secondary | ICD-10-CM | POA: Diagnosis not present

## 2024-01-17 DIAGNOSIS — M79642 Pain in left hand: Secondary | ICD-10-CM

## 2024-01-17 NOTE — Progress Notes (Signed)
 Pain Scale   Average Pain 1 Patient advising he has bilateral numbness and tingling to hands. Patient states he doesn't have pain ans much as it causes him to be uncomfortable. Patient is left hand dominate        +Driver, -BT, -Dye Allergies.

## 2024-01-17 NOTE — Progress Notes (Signed)
 Adam Vaughn - 48 y.o. male MRN [id number]  Date of birth: [birth year]  Office Visit Note: Visit Date: 01/17/2024 PCP: Garald Karlynn GAILS, MD Referred by: Addie Cordella Hamilton, MD  Subjective: Chief Complaint  Patient presents with   Right Hand - Numbness, Weakness   Left Hand - Numbness, Weakness   HPI: Adam Vaughn is a 48 y.o. male who comes in today at the request of Dr. JUDITHANN Hamilton Addie for evaluation and management of chronic, worsening and severe pain, numbness and tingling in the Bilateral upper extremities.  Patient is Right hand dominant.  He is fairly well-known in our clinic.  He is followed by both Dr. Addie for his upper extremity and shoulders and neck as well as Dr. Georgina for his cervical spine and lumbar spine.  He has had multiple epidural injections with some level of relief.  Now has bilateral paresthesias and pain in both arms and hands somewhat nondermatomal and global.  His case is complicated by chronic pain syndrome and multiple joint arthralgias.  He is on a lot of medications for chronic pain.  He has had prior electrodiagnostic studies of both lower extremities in 2024 by Dr. Tonita Blanch.  These did show some level of mild radiculopathy but no large fiber polyneuropathy.  No history of diabetes.   I spent more than 30 minutes speaking face-to-face with the patient with 50% of the time in counseling and discussing coordination of care.       Review of Systems  Musculoskeletal:  Positive for back pain, joint pain and neck pain.  Neurological:  Positive for tingling and weakness.  All other systems reviewed and are negative.  Otherwise per HPI.  Assessment & Plan: Visit Diagnoses:    ICD-10-CM   1. Paresthesia of skin  R20.2 NCV with EMG (electromyography)    2. Pain in left hand  M79.642     3. Pain in right hand  M79.641     4. Chronic right shoulder pain  M25.511    G89.29     5. Chronic pain syndrome  G89.4        Plan:  Impression: Could have some level of carpal tunnel syndrome although symptoms are global and nondermatomal and chronic.  Could be underlying central sensitization type pain syndrome such as fibromyalgia.  Electrodiagnostic study performed today.  Essentially NORMAL electrodiagnostic study of both upper limbs.  There is no significant electrodiagnostic evidence of nerve entrapment, brachial plexopathy, cervical radiculopathy or generalized peripheral neuropathy.    As you know, purely sensory or demyelinating radiculopathies and chemical radiculitis may not be detected with this particular electrodiagnostic study. **This electrodiagnostic study cannot rule out small fiber polyneuropathy and dysesthesias from central pain syndromes such as stroke or central pain sensitization syndromes such as fibromyalgia.  Myotomal referral pain from trigger points is also not excluded.  Recommendations: 1.  Follow-up with referring physician. 2.  Continue current management of symptoms.  Meds & Orders: No orders of the defined types were placed in this encounter.   Orders Placed This Encounter  Procedures   NCV with EMG (electromyography)    Follow-up: Return for  G. Hamilton Addie, MD.   Procedures: No procedures performed  EMG & NCV Findings: All nerve conduction studies (as indicated in the following tables) were within normal limits.  All left vs. right side differences were within normal limits.    All examined muscles (as indicated in the following table) showed no evidence of electrical instability.  Impression: Essentially NORMAL electrodiagnostic study of both upper limbs.  There is no significant electrodiagnostic evidence of nerve entrapment, brachial plexopathy, cervical radiculopathy or generalized peripheral neuropathy.    As you know, purely sensory or demyelinating radiculopathies and chemical radiculitis may not be detected with this particular electrodiagnostic study. **This  electrodiagnostic study cannot rule out small fiber polyneuropathy and dysesthesias from central pain syndromes such as stroke or central pain sensitization syndromes such as fibromyalgia.  Myotomal referral pain from trigger points is also not excluded.  Recommendations: 1.  Follow-up with referring physician. 2.  Continue current management of symptoms.  ___________________________ Prentice Masters FAAPMR Board Certified, American Board of Physical Medicine and Rehabilitation    Nerve Conduction Studies Anti Sensory Summary Table   Stim Site NR Peak (ms) Norm Peak (ms) P-T Amp (V) Norm P-T Amp Site1 Site2 Delta-P (ms) Dist (cm) Vel (m/s) Norm Vel (m/s)  Left Median Acr Palm Anti Sensory (2nd Digit)  31.1C  Wrist    3.2 <3.6 37.7 >10 Wrist Palm 1.4 0.0    Palm    1.8 <2.0 27.4         Right Median Acr Palm Anti Sensory (2nd Digit)  31C  Wrist    3.4 <3.6 27.0 >10 Wrist Palm 1.7 0.0    Palm    1.7 <2.0 7.8         Left Radial Anti Sensory (Base 1st Digit)  30.9C  Wrist    2.2 <3.1 26.6  Wrist Base 1st Digit 2.2 0.0    Right Radial Anti Sensory (Base 1st Digit)  30.9C  Wrist    2.0 <3.1 40.2  Wrist Base 1st Digit 2.0 0.0    Left Ulnar Anti Sensory (5th Digit)  31.4C  Wrist    3.6 <3.7 28.4 >15.0 Wrist 5th Digit 3.6 14.0 39 >38  Right Ulnar Anti Sensory (5th Digit)  31.3C  Wrist    3.4 <3.7 36.7 >15.0 Wrist 5th Digit 3.4 14.0 41 >38   Motor Summary Table   Stim Site NR Onset (ms) Norm Onset (ms) O-P Amp (mV) Norm O-P Amp Site1 Site2 Delta-0 (ms) Dist (cm) Vel (m/s) Norm Vel (m/s)  Left Median Motor (Abd Poll Brev)  31.3C  Wrist    3.2 <4.2 13.7 >5 Elbow Wrist 3.8 22.0 58 >50  Elbow    7.0  10.6         Right Median Motor (Abd Poll Brev)  31.1C  Wrist    3.4 <4.2 10.0 >5 Elbow Wrist 3.8 20.5 54 >50  Elbow    7.2  8.4         Left Ulnar Motor (Abd Dig Min)  31.5C  Wrist    3.5 <4.2 11.1 >3 B Elbow Wrist 3.4 18.5 54 >53  B Elbow    6.9  10.4  A Elbow B Elbow 1.3 10.0 77 >53   A Elbow    8.2  10.2         Right Ulnar Motor (Abd Dig Min)  31.2C  Wrist    3.0 <4.2 9.8 >3 B Elbow Wrist 3.2 20.0 63 >53  B Elbow    6.2  10.2  A Elbow B Elbow 1.5 10.0 67 >53  A Elbow    7.7  10.5          EMG   Side Muscle Nerve Root Ins Act Fibs Psw Amp Dur Poly Recrt Int Bruna Comment  Right Abd Poll Brev Median C8-T1 Nml Nml Nml Nml  Nml 0 Nml Nml   Right 1stDorInt Ulnar C8-T1 Nml Nml Nml Nml Nml 0 Nml Nml   Right PronatorTeres Median C6-7 Nml Nml Nml Nml Nml 0 Nml Nml   Right Biceps Musculocut C5-6 Nml Nml Nml Nml Nml 0 Nml Nml   Right Deltoid Axillary C5-6 Nml Nml Nml Nml Nml 0 Nml Nml     Nerve Conduction Studies Anti Sensory Left/Right Comparison   Stim Site L Lat (ms) R Lat (ms) L-R Lat (ms) L Amp (V) R Amp (V) L-R Amp (%) Site1 Site2 L Vel (m/s) R Vel (m/s) L-R Vel (m/s)  Median Acr Palm Anti Sensory (2nd Digit)  31.1C  Wrist 3.2 3.4 0.2 37.7 27.0 28.4 Wrist Palm     Palm 1.8 1.7 0.1 27.4 7.8 71.5       Radial Anti Sensory (Base 1st Digit)  30.9C  Wrist 2.2 2.0 0.2 26.6 40.2 33.8 Wrist Base 1st Digit     Ulnar Anti Sensory (5th Digit)  31.4C  Wrist 3.6 3.4 0.2 28.4 36.7 22.6 Wrist 5th Digit 39 41 2   Motor Left/Right Comparison   Stim Site L Lat (ms) R Lat (ms) L-R Lat (ms) L Amp (mV) R Amp (mV) L-R Amp (%) Site1 Site2 L Vel (m/s) R Vel (m/s) L-R Vel (m/s)  Median Motor (Abd Poll Brev)  31.3C  Wrist 3.2 3.4 0.2 13.7 10.0 27.0 Elbow Wrist 58 54 4  Elbow 7.0 7.2 0.2 10.6 8.4 20.8       Ulnar Motor (Abd Dig Min)  31.5C  Wrist 3.5 3.0 0.5 11.1 9.8 11.7 B Elbow Wrist 54 63 9  B Elbow 6.9 6.2 0.7 10.4 10.2 1.9 A Elbow B Elbow 77 67 10  A Elbow 8.2 7.7 0.5 10.2 10.5 2.9          Waveforms:                     Clinical History: NCV & EMG Findings: (07/19/2022) Electrodiagnostic testing of the right lower extremity and additional studies of the left shows: 1. Bilateral sural and superficial peroneal sensory responses are within normal  limits. 2. Bilateral peroneal and tibial motor responses are within normal limits. 3. Bilateral tibial H reflex studies are within normal limits. 4. Chronic motor axon loss changes are seen affecting the rectus femoris and adductor longus muscles on the right, without accompanying active denervation.  These findings are not present in the left lower extremity.   Impression: 1. 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 2. There is no evidence of a large fiber sensorimotor polyneuropathy affecting the lower extremities.     ___________________________ Tonita Blanch, DO   He reports that he has never smoked. He has never been exposed to tobacco smoke. He has never used smokeless tobacco. No results for input(s): HGBA1C, LABURIC in the last 8760 hours.  Objective:  VS:  HT:    WT:   BMI:     BP:   HR: bpm  TEMP: ( )  RESP:  Physical Exam Vitals and nursing note reviewed.  Constitutional:      General: He is not in acute distress.    Appearance: Normal appearance. He is well-developed.  HENT:     Head: Normocephalic and atraumatic.  Eyes:     Conjunctiva/sclera: Conjunctivae normal.     Pupils: Pupils are equal, round, and reactive to light.  Cardiovascular:     Rate and Rhythm: Normal rate.     Pulses: Normal pulses.  Heart sounds: Normal heart sounds.  Pulmonary:     Effort: Pulmonary effort is normal. No respiratory distress.  Musculoskeletal:        General: No tenderness.     Cervical back: Normal range of motion and neck supple. No rigidity.     Right lower leg: No edema.     Left lower leg: No edema.     Comments: Inspection reveals no atrophy of the bilateral APB or FDI or hand intrinsics. There is no swelling, color changes, allodynia or dystrophic changes. There is 5 out of 5 strength in the bilateral wrist extension, finger abduction and long finger flexion. There is intact sensation to light touch in all dermatomal and peripheral nerve distributions. There is a  negative Froment's test bilaterally. There is a negative Tinel's test at the bilateral wrist and elbow. There is a negative Phalen's test bilaterally. There is a negative Hoffmann's test bilaterally.  Skin:    General: Skin is warm and dry.     Findings: No erythema or rash.  Neurological:     General: No focal deficit present.     Mental Status: He is alert and oriented to person, place, and time.     Sensory: No sensory deficit.     Motor: No weakness or abnormal muscle tone.     Coordination: Coordination normal.     Gait: Gait normal.  Psychiatric:        Mood and Affect: Mood normal.        Behavior: Behavior normal.        Thought Content: Thought content normal.     Ortho Exam  Imaging: No results found.  Past Medical/Family/Surgical/Social History: Medications & Allergies reviewed per EMR, new medications updated. 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   Chronic eczematous otitis externa of both ears 05/16/2022   Hypertrophy of inferior nasal turbinate 05/16/2022   Situational depression 05/14/2022   Coronary atherosclerosis 03/26/2022   Dyslipidemia 01/11/2022   Chronic pain syndrome 11/07/2021   Piriformis syndrome 11/07/2021   Bladder neck obstruction 09/04/2021   Asthmatic bronchitis 09/04/2021   Pain in right shoulder 06/29/2021   Meteorism 06/01/2021   Sinus congestion 02/28/2021   Vertigo 02/17/2021   Headache 02/17/2021   Fever 02/17/2021   Falls 02/17/2021   Allergic rhinitis 10/25/2020   Family history of acute polio 10/25/2020   Chest pain, atypical 10/10/2020   S/P laparoscopic cholecystectomy 10/10/2020   Hyperglycemia 10/10/2020   Acute cholecystitis 09/30/2020   Insomnia 09/26/2020   Knee pain 06/23/2020   Low vitamin B12  level 05/31/2020   Ankle sprain 05/31/2020   Constipation 05/31/2020   Degenerative spondylolisthesis 04/11/2020   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   DDD (degenerative disc disease), lumbar 01/20/2020   Left lumbar radiculitis 01/20/2020   Vitamin D  deficiency 01/15/2020   Low back pain 01/12/2020   IBS (irritable bowel syndrome) 01/12/2020   Nephrolithiasis 01/12/2020   Erectile dysfunction 01/12/2020   GERD (gastroesophageal reflux disease) 01/12/2020   Past Medical History:  Diagnosis Date   Arthritis    Chronic back pain    Family history of colon cancer    sister   GERD (gastroesophageal reflux disease)    History of kidney stones    Hyperlipidemia  Hypertension    Family History  Problem Relation Age of Onset   Heart disease Father 23       MI   Hypertension Father    Heart attack Father    Other Father        Small intestine removed but does not know why   Diabetes Sister    Heart disease Sister 49       MI   Colon cancer Sister    Colon polyps Sister    Colon polyps Sister    Healthy Sister    Healthy Sister    Healthy Sister    Other Brother        MVA   Healthy Brother    Healthy Brother    Healthy Brother    Healthy Brother    Healthy Brother    Healthy Brother    Liver disease Neg Hx    Pancreatic cancer Neg Hx    Esophageal cancer Neg Hx    Stomach cancer Neg Hx    Past Surgical History:  Procedure Laterality Date   BACK SURGERY  04/11/2020   CHOLECYSTECTOMY N/A 10/01/2020   Procedure: LAPAROSCOPIC CHOLECYSTECTOMY;  Surgeon: Curvin Deward MOULD, MD;  Location: [facility];  Service: General;  Laterality: N/A;   COLONOSCOPY  1991   IBS   endocolon  09/15/2021   [facility], GERD   HAND SURGERY Bilateral    SHOULDER SURGERY Right 08/2021   SPINAL CORD STIMULATOR IMPLANT  2022   SPINAL CORD STIMULATOR REMOVAL  2022   TONSILLECTOMY     VASECTOMY     Social History   Occupational History   Not on file  Tobacco  Use   Smoking status: Never    Passive exposure: Never   Smokeless tobacco: Never  Vaping Use   Vaping status: Never Used  Substance and Sexual Activity   Alcohol use: Not Currently    Comment: States quit around age 57   Drug use: Never   Sexual activity: Not Currently    Birth control/protection: Surgical    Comment: vastectomy

## 2024-01-19 NOTE — Procedures (Signed)
 EMG & NCV Findings: All nerve conduction studies (as indicated in the following tables) were within normal limits.  All left vs. right side differences were within normal limits.    All examined muscles (as indicated in the following table) showed no evidence of electrical instability.    Impression: Essentially NORMAL electrodiagnostic study of both upper limbs.  There is no significant electrodiagnostic evidence of nerve entrapment, brachial plexopathy, cervical radiculopathy or generalized peripheral neuropathy.    As you know, purely sensory or demyelinating radiculopathies and chemical radiculitis may not be detected with this particular electrodiagnostic study. **This electrodiagnostic study cannot rule out small fiber polyneuropathy and dysesthesias from central pain syndromes such as stroke or central pain sensitization syndromes such as fibromyalgia.  Myotomal referral pain from trigger points is also not excluded.  Recommendations: 1.  Follow-up with referring physician. 2.  Continue current management of symptoms.  ___________________________ Prentice Masters FAAPMR Board Certified, American Board of Physical Medicine and Rehabilitation    Nerve Conduction Studies Anti Sensory Summary Table   Stim Site NR Peak (ms) Norm Peak (ms) P-T Amp (V) Norm P-T Amp Site1 Site2 Delta-P (ms) Dist (cm) Vel (m/s) Norm Vel (m/s)  Left Median Acr Palm Anti Sensory (2nd Digit)  31.1C  Wrist    3.2 <3.6 37.7 >10 Wrist Palm 1.4 0.0    Palm    1.8 <2.0 27.4         Right Median Acr Palm Anti Sensory (2nd Digit)  31C  Wrist    3.4 <3.6 27.0 >10 Wrist Palm 1.7 0.0    Palm    1.7 <2.0 7.8         Left Radial Anti Sensory (Base 1st Digit)  30.9C  Wrist    2.2 <3.1 26.6  Wrist Base 1st Digit 2.2 0.0    Right Radial Anti Sensory (Base 1st Digit)  30.9C  Wrist    2.0 <3.1 40.2  Wrist Base 1st Digit 2.0 0.0    Left Ulnar Anti Sensory (5th Digit)  31.4C  Wrist    3.6 <3.7 28.4 >15.0 Wrist 5th Digit  3.6 14.0 39 >38  Right Ulnar Anti Sensory (5th Digit)  31.3C  Wrist    3.4 <3.7 36.7 >15.0 Wrist 5th Digit 3.4 14.0 41 >38   Motor Summary Table   Stim Site NR Onset (ms) Norm Onset (ms) O-P Amp (mV) Norm O-P Amp Site1 Site2 Delta-0 (ms) Dist (cm) Vel (m/s) Norm Vel (m/s)  Left Median Motor (Abd Poll Brev)  31.3C  Wrist    3.2 <4.2 13.7 >5 Elbow Wrist 3.8 22.0 58 >50  Elbow    7.0  10.6         Right Median Motor (Abd Poll Brev)  31.1C  Wrist    3.4 <4.2 10.0 >5 Elbow Wrist 3.8 20.5 54 >50  Elbow    7.2  8.4         Left Ulnar Motor (Abd Dig Min)  31.5C  Wrist    3.5 <4.2 11.1 >3 B Elbow Wrist 3.4 18.5 54 >53  B Elbow    6.9  10.4  A Elbow B Elbow 1.3 10.0 77 >53  A Elbow    8.2  10.2         Right Ulnar Motor (Abd Dig Min)  31.2C  Wrist    3.0 <4.2 9.8 >3 B Elbow Wrist 3.2 20.0 63 >53  B Elbow    6.2  10.2  A Elbow B Elbow 1.5 10.0  67 >53  A Elbow    7.7  10.5          EMG   Side Muscle Nerve Root Ins Act Fibs Psw Amp Dur Poly Recrt Int Bruna Comment  Right Abd Poll Brev Median C8-T1 Nml Nml Nml Nml Nml 0 Nml Nml   Right 1stDorInt Ulnar C8-T1 Nml Nml Nml Nml Nml 0 Nml Nml   Right PronatorTeres Median C6-7 Nml Nml Nml Nml Nml 0 Nml Nml   Right Biceps Musculocut C5-6 Nml Nml Nml Nml Nml 0 Nml Nml   Right Deltoid Axillary C5-6 Nml Nml Nml Nml Nml 0 Nml Nml     Nerve Conduction Studies Anti Sensory Left/Right Comparison   Stim Site L Lat (ms) R Lat (ms) L-R Lat (ms) L Amp (V) R Amp (V) L-R Amp (%) Site1 Site2 L Vel (m/s) R Vel (m/s) L-R Vel (m/s)  Median Acr Palm Anti Sensory (2nd Digit)  31.1C  Wrist 3.2 3.4 0.2 37.7 27.0 28.4 Wrist Palm     Palm 1.8 1.7 0.1 27.4 7.8 71.5       Radial Anti Sensory (Base 1st Digit)  30.9C  Wrist 2.2 2.0 0.2 26.6 40.2 33.8 Wrist Base 1st Digit     Ulnar Anti Sensory (5th Digit)  31.4C  Wrist 3.6 3.4 0.2 28.4 36.7 22.6 Wrist 5th Digit 39 41 2   Motor Left/Right Comparison   Stim Site L Lat (ms) R Lat (ms) L-R Lat (ms) L Amp (mV) R Amp  (mV) L-R Amp (%) Site1 Site2 L Vel (m/s) R Vel (m/s) L-R Vel (m/s)  Median Motor (Abd Poll Brev)  31.3C  Wrist 3.2 3.4 0.2 13.7 10.0 27.0 Elbow Wrist 58 54 4  Elbow 7.0 7.2 0.2 10.6 8.4 20.8       Ulnar Motor (Abd Dig Min)  31.5C  Wrist 3.5 3.0 0.5 11.1 9.8 11.7 B Elbow Wrist 54 63 9  B Elbow 6.9 6.2 0.7 10.4 10.2 1.9 A Elbow B Elbow 77 67 10  A Elbow 8.2 7.7 0.5 10.2 10.5 2.9          Waveforms:

## 2024-01-20 ENCOUNTER — Encounter: Payer: Self-pay | Admitting: Orthopedic Surgery

## 2024-01-26 ENCOUNTER — Encounter: Payer: Self-pay | Admitting: Physical Medicine and Rehabilitation

## 2024-01-27 NOTE — Therapy (Signed)
 OUTPATIENT PHYSICAL THERAPY TREATMENT   Patient Name: Adam Vaughn MRN: [id number] DOB:June 30, 1976, 48 y.o., male Today's Date: 01/28/2024  END OF SESSION:  PT End of Session - 01/28/24 1012     Visit Number 5    Number of Visits 16    Date for PT Re-Evaluation 02/07/24    Authorization Type [employer]    Progress Note Due on Visit 10    PT Start Time 1015    PT Stop Time 1057    PT Time Calculation (min) 42 min    Activity Tolerance Patient limited by pain    Behavior During Therapy WFL for tasks assessed/performed              Past Medical History:  Diagnosis Date   Arthritis    Chronic back pain    Family history of colon cancer    sister   GERD (gastroesophageal reflux disease)    History of kidney stones    Hyperlipidemia    Hypertension    Past Surgical History:  Procedure Laterality Date   BACK SURGERY  04/11/2020   CHOLECYSTECTOMY N/A 10/01/2020   Procedure: LAPAROSCOPIC CHOLECYSTECTOMY;  Surgeon: Curvin Deward MOULD, MD;  Location: [facility];  Service: General;  Laterality: N/A;   COLONOSCOPY  1991   IBS   endocolon  09/15/2021   [facility], GERD   HAND SURGERY Bilateral    SHOULDER SURGERY Right 08/2021   SPINAL CORD STIMULATOR IMPLANT  2022   SPINAL CORD STIMULATOR REMOVAL  2022   TONSILLECTOMY     VASECTOMY     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   Chronic eczematous otitis externa of both ears 05/16/2022   Hypertrophy of inferior nasal turbinate 05/16/2022   Situational depression 05/14/2022   Coronary atherosclerosis 03/26/2022   Dyslipidemia 01/11/2022   Chronic pain syndrome 11/07/2021   Piriformis syndrome 11/07/2021   Bladder neck obstruction 09/04/2021   Asthmatic bronchitis 09/04/2021   Pain in right shoulder  06/29/2021   Meteorism 06/01/2021   Sinus congestion 02/28/2021   Vertigo 02/17/2021   Headache 02/17/2021   Fever 02/17/2021   Falls 02/17/2021   Allergic rhinitis 10/25/2020   Family history of acute polio 10/25/2020   Chest pain, atypical 10/10/2020   S/P laparoscopic cholecystectomy 10/10/2020   Hyperglycemia 10/10/2020   Acute cholecystitis 09/30/2020   Insomnia 09/26/2020   Knee pain 06/23/2020   Low vitamin B12 level 05/31/2020   Ankle sprain 05/31/2020   Constipation 05/31/2020   Degenerative spondylolisthesis 04/11/2020   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   DDD (degenerative disc disease), lumbar 01/20/2020   Left lumbar radiculitis 01/20/2020   Vitamin D  deficiency 01/15/2020   Low back pain 01/12/2020   IBS (irritable bowel syndrome) 01/12/2020   Nephrolithiasis 01/12/2020   Erectile dysfunction 01/12/2020   GERD (gastroesophageal reflux disease) 01/12/2020    PCP: Garald Karlynn GAILS, MD   REFERRING PROVIDER: Addie Cordella Hamilton, MD   REFERRING DIAG:  Diagnosis  M25.562 (ICD-10-CM) - Acute pain of left knee    THERAPY DIAG:  Acute pain of left knee  Difficulty in walking, not elsewhere classified  Cervicalgia  Muscle weakness (generalized)  Abnormal posture  Rationale for Evaluation and Treatment: Rehabilitation  ONSET DATE: several months per pt report  SUBJECTIVE:                                                                                                                                                                                                         SUBJECTIVE STATEMENT: My back feels a little better today. Appointments next week for injections in both knees. Neck is the same. 6/10 for both knees, 5/10 for low back, 5-6/10 for neck.    PERTINENT HISTORY:  Rt shoulder surgery 08/2022, foot surgery 11/14/23, spinal fusion 04/2020  PAIN:  NPRS scale: see above  Pain description: achy  mostly sharp at times, neck pain can be tingling and stabbing at times, and HA Aggravating factors: bending, squatting, standing, walking, in the knee, turning head or looking upward in the neck Relieving factors: prescription pain meds  PRECAUTIONS: None  RED FLAGS: None  WEIGHT BEARING RESTRICTIONS: No  FALLS:  Has patient fallen in last 6 months? Yes. Number of falls 10 due to bil knee buckeling  LIVING ENVIRONMENT: Lives with: lives with their family and lives with their spouse Lives in: House/apartment Stairs: Yes: External: 3 steps; on left going up Has following equipment at home: Single point cane  OCCUPATION: disabled  PLOF: Independent  PATIENT GOALS: stop hurting, walk without pain  Next MD visit:  OBJECTIVE:   DIAGNOSTIC FINDINGS:  Result Narrative: 12/02/23  AP lateral cervical spine radiographs reviewed.  No acute fracture or spondylolisthesis.  Normal lordosis.  No significant degenerative changes between the vertebral bodies or in the facet joints.  Knee - Left 05/2023 1. Oblique tear of the posterior horn-body junction of the medial meniscus extending to the inferior articular surface and extending into the posterior horn. 2. Partial-thickness cartilage loss of the medial patellar facet with cartilage fissuring and subchondral marrow edema. 3. Mild partial-thickness cartilage loss of the weight-bearing medial femorotibial compartment.     PATIENT SURVEYS: Patient-Specific Activity Scoring Scheme  0 represents "unable to perform." 10 represents "able to perform at prior level. 0 1 2 3 4 5 6 7 8 9  10 (Date and Score)   Activity Eval  12/09/23 01/14/24   1. squatting 6   2  2. Bending my knee over my other knee (crossing leg) 7   4  3. sleeping 6 3  4.sit to stand, stand to sit 7 6  5.    Score 6.5 3.75   Total score = sum of the activity scores/number of activities Minimum detectable change (90%CI) for average score = 2 points Minimum  detectable change (90%CI) for single activity score =  3 points    COGNITION: 12/09/2023 Overall cognitive status: Within functional limits for tasks assessed  SENSATION: 12/09/2023 Numbness noted down bilateral UE's at times especially when sleeping  POSTURE:  12/09/2023 rounded shoulders, forward head, and decreased lumbar lordosis  PALPATION: 12/09/2023 TTP: anterior left knee with mild edema noted  Bilateral cervical paraspinals and upper traps   CERVICAL ROM:   ROM AROM (deg) Eval 12/09/2023 AROM 12/27/2023  Flexion 22 pain on left side 45  Extension 18 moderate pain  40  Right lateral flexion 28 pain noted   Left lateral flexion 18 pain noted   Right rotation 24 c pain 68  Left rotation 32 c pain 65   (Blank rows = not tested)  UPPER EXTREMITY ROM:   ROM Right Eval 12/09/2023 Left Eval 12/09/2023  Shoulder flexion 160 160  Shoulder extension    Shoulder abduction    Shoulder adduction    Shoulder extension    Shoulder internal rotation    Shoulder external rotation 45 40  Elbow flexion    Elbow extension        Knee flexion    Knee extension                 (Blank rows = not tested)  UPPER EXTREMITY MMT:  MMT Right Eval 12/09/2023 Left Eval 12/09/2023  Shoulder flexion 11.3 lbs Pain at anterior shoulder at [facility] joint 33.7 lbs  Shoulder extension    Shoulder abduction    Shoulder adduction    Shoulder extension    Shoulder internal rotation    Shoulder external rotation    Middle trapezius    Lower trapezius    Elbow flexion    Elbow extension        Knee flexion 43.2 lbs 12.2 lbs Pain limiting  Knee extension 43.9 lbs 14.7 lbs              Grip strength     (Blank rows = not tested)                                                                                                                                                                                 TODAY'S TREATMENT:                                                                          01/28/2024 TherEx:  Nustep seat 8, level 5 x 8  minutes  Deep discomfort noted at superior patella/distal quad  B hamstring curl on machine with 5#, 2x10 Standing hip abduction 2x10 each side   Neuro Re-Ed:  Paloff press with red TB  for core activation and rotational control  2x12 each direction  Straight arm pulldown for postural awareness with red TB  3x12 tactile and verbal cues required with great carryover  Leg extension machine with 5#; B LE up and L LE down (intermittently performing with L LE only throughout movement) for focus on eccentric control  2x10 Standing scapular clocks (12, 3, 6, 9) 2x10   DATE: 01/14/2024 Therex: Nustep: level 5  x 8 minutes Standing hip extension  2 x 10 bil LE c bil UE support Standing hip abduction 2 x 10 c bil UE support Standing hip flexion 2 x 10 c bil UE support Trunk rotation x 3 bil holding 20 sec Neuro Re-ed: (postural awareness, muscle recruitment) Wt shifting forward and back on each LE x 10 c UE support Reciprocal knee flexion/extension x 20 sitting Abdominal activation x 10 holding 10 sec in supine        TODAY'S TREATMENT:                                                                        DATE: 01/07/2024 Therex: Scifit bike: level 2 x 6 minutes Seated LAQ: 2 x 10, 3 #   Seated SLR: 2 x 10  Seated upper trap stretch x 3 bil holding 10 sec Supine cervical rotation x 5 bil holding 5 sec Supine trunk rotation: x 3 holding 20 sec Supine bridge:  x 10 c initial core activation Supine marching x 20 alternating Modified dead bug x 20  Neuro Re-ed: (postural awareness, muscle recruitment) Seated cervical retraction x 10 holding 5 sec Shoulder blade squeezes 2 x 10 , holding 5 sec Manual : Cervical distraction and occipital release, gentle PA grade 2-3 cervical mobs Moist heat following x 3 minutes (non billable)     TODAY'S TREATMENT:                                                                        DATE:  12/27/2023 Therex: Nustep lvl 5 8 mins UE/LE for ROM (back symptoms were noted in lvl 6 attempt to start) Seated quad set with SLR x 10 bilateral with cues for knee straight and slow control movement (3-4 inches of floor) Review of existing HEP and access ability through handout and website with code.  Cues for techniques given.   Neuro Re-ed: (postural awareness, muscle recruitment) Standing bilateral green band rows with scapular retraction focus 2 x 15 Standing bilateral green band GH ext 2 x 15 Seated quad set 5 sec hold x 10 bilateral    TODAY'S TREATMENT:  DATE: 12/09/23:  Therex: HEP instruction/performance c cues for techniques, handout provided.  Trial set performed of each for comprehension and symptom assessment.  See below for exercise list Self Care: Sleeping position and using pillows for support  under bil shoulders to help with numbness during the night while sleeping on his back   PATIENT EDUCATION:  Education details: HEP, POC Person educated: Patient Education method: Explanation, Demonstration, Verbal cues, and Handouts Education comprehension: verbalized understanding, returned demonstration, and verbal cues required  HOME EXERCISE PROGRAM: Access Code: [email or web address] URL: [email or web address] Date: 12/27/2023 Prepared by: Ozell Silvan  Exercises - Supine Cervical Retraction with Towel  - 1-2 x daily - 7 x weekly - 10 reps - 5 secconds hold - Cervical Retraction at Wall  - 1-2 x daily - 7 x weekly - 1 sets - 10 reps - 5 hold - Supine Cervical Rotation Passive Unloaded  - 2 x daily - 7 x weekly - 5 reps - 5-10 seconds hold - Seated Scapular Retraction  - 3-5 x daily - 7 x weekly - 1 sets - 10 reps - 3-5 hold - Supine Quad Set  - 2 x daily - 7 x weekly - 10 reps - Seated Quad Set (Mirrored)  - 2-3 x daily - 7 x weekly - 1 sets - 10 reps - 5 hold - Seated Long Arc Quad (Mirrored)  -  2 x daily - 7 x weekly - 2 sets - 10 reps - Seated Straight Leg Heel Taps  - 1-2 x daily - 7 x weekly - 3 sets - 10 reps  ASSESSMENT:  CLINICAL IMPRESSION: Patient arrived to session today reporting improved symptoms from low back pain, though continues to have limited generalized mobility and increased knee pain during the morning as well as increased pain throughout the day. Patient tolerated Nustep, core exercises, and neuro re-ed exercises well. One instance of Rt knee buckling noted, though patient able to self-correct with UE use on counter. Patient will continue to benefit from skilled PT.      OBJECTIVE IMPAIRMENTS: Abnormal gait, decreased activity tolerance, decreased mobility, difficulty walking, decreased ROM, decreased strength, impaired flexibility, postural dysfunction, and pain.   ACTIVITY LIMITATIONS: lifting, bending, sitting, standing, squatting, sleeping, stairs, and transfers  PARTICIPATION LIMITATIONS: driving, community activity, and yard work  PERSONAL FACTORS: 3+ comorbidities: see PMH above are also affecting patient's functional outcome.   REHAB POTENTIAL: Good  CLINICAL DECISION MAKING: Evolving/moderate complexity  EVALUATION COMPLEXITY: Moderate   GOALS: Goals reviewed with patient? Yes  SHORT TERM GOALS: (target date for Short term goals are 3 weeks 12/30/2023 )  1.Patient will demonstrate independent use of home exercise program to maintain progress from in clinic treatments. Goal status: Met 01/07/24  LONG TERM GOALS: (target dates for all long term goals are  weeks  8 weeks, 02/03/2024 )   1. Patient will demonstrate/report pain at worst less than or equal to 2/10 to facilitate minimal limitation in daily activity secondary to pain symptoms. Goal status: New   2. Patient will demonstrate independent use of home exercise program to facilitate ability to maintain/progress functional gains from skilled physical therapy services. Goal status: New   3.  Patient will demonstrate Patient specific functional scale avg > or = 8.5 to indicate reduced disability due to condition.  Goal status: New   4.  Patient will demonstrate bilateral cervical rotation AROM to >/= 45 degrees s symptoms to facilitate usual head movements for daily activity including driving, self care.  Goal status: New   5.   Pt will improve his Left knee MMT to >/= 20 lbs  for flexion and extension to improve functional mobility.  Goal status: New    PLAN:  PT FREQUENCY: 1-2x/week  PT DURATION: 8 weeks  Can include 02853- PT Re-evaluation, 97110-Therapeutic exercises, 97530- Therapeutic activity, 97112- Neuromuscular re-education, 97535- Self Care, [id number]- Manual therapy, [phone]- Gait training, [phone]- Orthotic Fit/training, [phone]- Canalith repositioning, [id number]- Aquatic Therapy, [phone] Electrical stimulation (unattended), [phone]- Electrical stimulation (manual), [id number]- Vasopneumatic device, [id number]- Ultrasound, [id number] Physical performance testing, [id number]- Traction (mechanical), [id number]- Ionotophoresis 4mg /ml Dexamethasone , Patient/Family education, Balance training, Stair training, Taping, Dry Needling, Joint mobilization, Joint manipulation, Spinal manipulation, Spinal mobilization, Scar mobilization, Vestibular training, Visual/preceptual remediation/compensation, DME instructions, Cryotherapy, and Moist heat.  All performed as medically necessary.  All included unless contraindicated  PLAN FOR NEXT SESSION: Progressive non WB strengthening per referral indications for LE, cervical and lumbar interventions as needed      Susannah Daring, PT, DPT 01/28/24 1:07 PM

## 2024-01-28 ENCOUNTER — Ambulatory Visit (INDEPENDENT_AMBULATORY_CARE_PROVIDER_SITE_OTHER)

## 2024-01-28 DIAGNOSIS — R262 Difficulty in walking, not elsewhere classified: Secondary | ICD-10-CM

## 2024-01-28 DIAGNOSIS — R293 Abnormal posture: Secondary | ICD-10-CM

## 2024-01-28 DIAGNOSIS — M6281 Muscle weakness (generalized): Secondary | ICD-10-CM

## 2024-01-28 DIAGNOSIS — M25562 Pain in left knee: Secondary | ICD-10-CM

## 2024-01-28 DIAGNOSIS — M542 Cervicalgia: Secondary | ICD-10-CM | POA: Diagnosis not present

## 2024-02-04 ENCOUNTER — Encounter: Payer: Self-pay | Admitting: Rehabilitative and Restorative Service Providers"

## 2024-02-04 ENCOUNTER — Other Ambulatory Visit: Payer: Self-pay | Admitting: Internal Medicine

## 2024-02-04 ENCOUNTER — Ambulatory Visit: Admitting: Rehabilitative and Restorative Service Providers"

## 2024-02-04 DIAGNOSIS — M6281 Muscle weakness (generalized): Secondary | ICD-10-CM | POA: Diagnosis not present

## 2024-02-04 DIAGNOSIS — M25562 Pain in left knee: Secondary | ICD-10-CM

## 2024-02-04 DIAGNOSIS — M542 Cervicalgia: Secondary | ICD-10-CM

## 2024-02-04 DIAGNOSIS — R262 Difficulty in walking, not elsewhere classified: Secondary | ICD-10-CM | POA: Diagnosis not present

## 2024-02-04 DIAGNOSIS — E291 Testicular hypofunction: Secondary | ICD-10-CM

## 2024-02-04 DIAGNOSIS — R293 Abnormal posture: Secondary | ICD-10-CM | POA: Diagnosis not present

## 2024-02-04 NOTE — Therapy (Addendum)
 OUTPATIENT PHYSICAL THERAPY TREATMENT  / DISCHARGE    Patient Name: Adam Vaughn MRN: [id number] DOB:[birth year], 48 y.o., male Today's Date: 02/04/2024  END OF SESSION:  PT End of Session - 02/04/24 0926     Visit Number 6    Number of Visits 16    Date for PT Re-Evaluation 02/07/24    Authorization Type [employer]    Progress Note Due on Visit 10    PT Start Time 0927    PT Stop Time 1006    PT Time Calculation (min) 39 min    Activity Tolerance Patient limited by pain    Behavior During Therapy WFL for tasks assessed/performed               Past Medical History:  Diagnosis Date   Arthritis    Chronic back pain    Family history of colon cancer    sister   GERD (gastroesophageal reflux disease)    History of kidney stones    Hyperlipidemia    Hypertension    Past Surgical History:  Procedure Laterality Date   BACK SURGERY  04/11/2020   CHOLECYSTECTOMY N/A 10/01/2020   Procedure: LAPAROSCOPIC CHOLECYSTECTOMY;  Surgeon: Curvin Deward MOULD, MD;  Location: [facility];  Service: General;  Laterality: N/A;   COLONOSCOPY  1991   IBS   endocolon  09/15/2021   [facility], GERD   HAND SURGERY Bilateral    SHOULDER SURGERY Right 08/2021   SPINAL CORD STIMULATOR IMPLANT  2022   SPINAL CORD STIMULATOR REMOVAL  2022   TONSILLECTOMY     VASECTOMY     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   Chronic eczematous otitis externa of both ears 05/16/2022   Hypertrophy of inferior nasal turbinate 05/16/2022   Situational depression 05/14/2022   Coronary atherosclerosis 03/26/2022   Dyslipidemia 01/11/2022   Chronic pain syndrome 11/07/2021   Piriformis syndrome 11/07/2021   Bladder neck obstruction 09/04/2021   Asthmatic bronchitis 09/04/2021   Pain in  right shoulder 06/29/2021   Meteorism 06/01/2021   Sinus congestion 02/28/2021   Vertigo 02/17/2021   Headache 02/17/2021   Fever 02/17/2021   Falls 02/17/2021   Allergic rhinitis 10/25/2020   Family history of acute polio 10/25/2020   Chest pain, atypical 10/10/2020   S/P laparoscopic cholecystectomy 10/10/2020   Hyperglycemia 10/10/2020   Acute cholecystitis 09/30/2020   Insomnia 09/26/2020   Knee pain 06/23/2020   Low vitamin B12 level 05/31/2020   Ankle sprain 05/31/2020   Constipation 05/31/2020   Degenerative spondylolisthesis 04/11/2020   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   DDD (degenerative disc disease), lumbar 01/20/2020   Left lumbar radiculitis 01/20/2020   Vitamin D  deficiency 01/15/2020   Low back pain 01/12/2020   IBS (irritable bowel syndrome) 01/12/2020   Nephrolithiasis 01/12/2020   Erectile dysfunction 01/12/2020   GERD (gastroesophageal reflux disease) 01/12/2020    PCP: Garald Karlynn GAILS, MD   REFERRING PROVIDER: Addie Cordella Hamilton, MD   REFERRING DIAG:  Diagnosis  M25.562 (ICD-10-CM) - Acute pain of left knee    THERAPY DIAG:  Acute pain of left knee  Difficulty in walking, not elsewhere classified  Cervicalgia  Muscle weakness (generalized)  Abnormal posture  Rationale for Evaluation and Treatment: Rehabilitation  ONSET DATE:  several months per pt report  SUBJECTIVE:                                                                                                                                                                                                         SUBJECTIVE STATEMENT: Pt indicated soreness.  Pt indicated feeling tightness in lateral quad.      PERTINENT HISTORY:  Rt shoulder surgery 08/2022, foot surgery 11/14/23, spinal fusion 04/2020  PAIN:  NPRS scale: 5/10 Location:  back, knees Pain description: achy mostly sharp at times, neck pain can be tingling and stabbing at  times, and HA Aggravating factors: bending, squatting, standing, walking, in the knee, turning head or looking upward in the neck Relieving factors: prescription pain meds  PRECAUTIONS: None  RED FLAGS: None  WEIGHT BEARING RESTRICTIONS: No  FALLS:  Has patient fallen in last 6 months? Yes. Number of falls 10 due to bil knee buckeling  LIVING ENVIRONMENT: Lives with: lives with their family and lives with their spouse Lives in: House/apartment Stairs: Yes: External: 3 steps; on left going up Has following equipment at home: Single point cane  OCCUPATION: disabled  PLOF: Independent  PATIENT GOALS: stop hurting, walk without pain  Next MD visit:  OBJECTIVE:   DIAGNOSTIC FINDINGS:  Result Narrative: 12/02/23  AP lateral cervical spine radiographs reviewed.  No acute fracture or spondylolisthesis.  Normal lordosis.  No significant degenerative changes between the vertebral bodies or in the facet joints.  Knee - Left 05/2023 1. Oblique tear of the posterior horn-body junction of the medial meniscus extending to the inferior articular surface and extending into the posterior horn. 2. Partial-thickness cartilage loss of the medial patellar facet with cartilage fissuring and subchondral marrow edema. 3. Mild partial-thickness cartilage loss of the weight-bearing medial femorotibial compartment.     PATIENT SURVEYS: Patient-Specific Activity Scoring Scheme  0 represents "unable to perform." 10 represents "able to perform at prior level. 0 1 2 3 4 5 6 7 8 9  10 (Date and Score)   Activity Eval  12/09/23 01/14/24   1. squatting 6   2  2. Bending my knee over my other knee (crossing leg) 7   4  3. sleeping 6 3  4.sit to stand, stand to sit 7 6  5.    Score 6.5 3.75   Total score = sum of the activity scores/number of activities Minimum detectable change (90%CI) for average score = 2 points Minimum detectable change (90%CI) for single activity score = 3  points    COGNITION: 12/09/2023 Overall cognitive  status: Within functional limits for tasks assessed  SENSATION: 12/09/2023 Numbness noted down bilateral UE's at times especially when sleeping  POSTURE:  12/09/2023 rounded shoulders, forward head, and decreased lumbar lordosis  PALPATION: 12/09/2023 TTP: anterior left knee with mild edema noted  Bilateral cervical paraspinals and upper traps   CERVICAL ROM:   ROM AROM (deg) Eval 12/09/2023 AROM 12/27/2023 AROM 02/04/2024  Flexion 22 pain on left side 45   Extension 18 moderate pain  40   Right lateral flexion 28 pain noted    Left lateral flexion 18 pain noted    Right rotation 24 c pain 68   Left rotation 32 c pain 65    (Blank rows = not tested)  UPPER EXTREMITY ROM:   ROM Right Eval 12/09/2023 Left Eval 12/09/2023  Shoulder flexion 160 160  Shoulder extension    Shoulder abduction    Shoulder adduction    Shoulder extension    Shoulder internal rotation    Shoulder external rotation 45 40  Elbow flexion    Elbow extension        Knee flexion    Knee extension                 (Blank rows = not tested)  UPPER EXTREMITY MMT:  MMT Right Eval 12/09/2023 Left Eval 12/09/2023  Shoulder flexion 11.3 lbs Pain at anterior shoulder at [facility] joint 33.7 lbs  Shoulder extension    Shoulder abduction    Shoulder adduction    Shoulder extension    Shoulder internal rotation    Shoulder external rotation    Middle trapezius    Lower trapezius    Elbow flexion    Elbow extension        Knee flexion 43.2 lbs 12.2 lbs Pain limiting  Knee extension 43.9 lbs 14.7 lbs              Grip strength     (Blank rows = not tested)                                                                                                                                                                                 TODAY'S TREATMENT:                                                                        DATE:  02/04/2024 Therex: Nustep Lvl 6 10 mins UE/LE  Neuro Re-ed: (  postural awareness, muscle recruitment) Tband rows green band c scapular retraction focus 2 x 15 Tband GH ext bilateral 2 x 15  Green band double anti rotation walk outs with arms outstretched 5 sec hold x 10 bilaterally  Standing scapular retraction 2-3 sec hold x 10    TherActivity Leg Press double leg 56 lbs 2 x 15, single leg 25 lbs 2 x 15 bilateral     TODAY'S TREATMENT:                                                                        DATE: 01/28/2024 TherEx:  Nustep seat 8, level 5 x 8 minutes  Deep discomfort noted at superior patella/distal quad  B hamstring curl on machine with 5#, 2x10 Standing hip abduction 2x10 each side   Neuro Re-Ed:  Paloff press with red TB  for core activation and rotational control  2x12 each direction  Straight arm pulldown for postural awareness with red TB  3x12 tactile and verbal cues required with great carryover  Leg extension machine with 5#; B LE up and L LE down (intermittently performing with L LE only throughout movement) for focus on eccentric control  2x10 Standing scapular clocks (12, 3, 6, 9) 2x10   TODAY'S TREATMENT:                                                                        DATE:01/14/2024 Therex: Nustep: level 5  x 8 minutes Standing hip extension  2 x 10 bil LE c bil UE support Standing hip abduction 2 x 10 c bil UE support Standing hip flexion 2 x 10 c bil UE support Trunk rotation x 3 bil holding 20 sec Neuro Re-ed: (postural awareness, muscle recruitment) Wt shifting forward and back on each LE x 10 c UE support Reciprocal knee flexion/extension x 20 sitting Abdominal activation x 10 holding 10 sec in supine   TODAY'S TREATMENT:                                                                        DATE: 01/07/2024 Therex: Scifit bike: level 2 x 6 minutes Seated LAQ: 2 x 10, 3 #   Seated SLR: 2 x 10  Seated upper trap stretch x 3 bil  holding 10 sec Supine cervical rotation x 5 bil holding 5 sec Supine trunk rotation: x 3 holding 20 sec Supine bridge:  x 10 c initial core activation Supine marching x 20 alternating Modified dead bug x 20  Neuro Re-ed: (postural awareness, muscle recruitment) Seated cervical retraction x 10 holding 5 sec Shoulder blade squeezes 2 x 10 , holding 5 sec Manual : Cervical distraction and occipital release, gentle PA grade 2-3  cervical mobs Moist heat following x 3 minutes (non billable)    PATIENT EDUCATION:  Education details: HEP, POC Person educated: Patient Education method: Programmer, Multimedia, Demonstration, Verbal cues, and Handouts Education comprehension: verbalized understanding, returned demonstration, and verbal cues required  HOME EXERCISE PROGRAM: Access Code: [email or web address] URL: [email or web address] Date: 12/27/2023 Prepared by: Ozell Silvan  Exercises - Supine Cervical Retraction with Towel  - 1-2 x daily - 7 x weekly - 10 reps - 5 secconds hold - Cervical Retraction at Wall  - 1-2 x daily - 7 x weekly - 1 sets - 10 reps - 5 hold - Supine Cervical Rotation Passive Unloaded  - 2 x daily - 7 x weekly - 5 reps - 5-10 seconds hold - Seated Scapular Retraction  - 3-5 x daily - 7 x weekly - 1 sets - 10 reps - 3-5 hold - Supine Quad Set  - 2 x daily - 7 x weekly - 10 reps - Seated Quad Set (Mirrored)  - 2-3 x daily - 7 x weekly - 1 sets - 10 reps - 5 hold - Seated Long Arc Quad (Mirrored)  - 2 x daily - 7 x weekly - 2 sets - 10 reps - Seated Straight Leg Heel Taps  - 1-2 x daily - 7 x weekly - 3 sets - 10 reps  ASSESSMENT:  CLINICAL IMPRESSION: Continued to address LE strength as able with mindful adjustments to avoid knee pain worsening in activity.  Pt has continued to exhibit higher severity of symptoms which can impact functional movements, ambulation and daily activity.  Injections in knees to be performed and plan to reassess response on next MD visit.      OBJECTIVE IMPAIRMENTS: Abnormal gait, decreased activity tolerance, decreased mobility, difficulty walking, decreased ROM, decreased strength, impaired flexibility, postural dysfunction, and pain.   ACTIVITY LIMITATIONS: lifting, bending, sitting, standing, squatting, sleeping, stairs, and transfers  PARTICIPATION LIMITATIONS: driving, community activity, and yard work  PERSONAL FACTORS: 3+ comorbidities: see PMH above are also affecting patient's functional outcome.   REHAB POTENTIAL: Good  CLINICAL DECISION MAKING: Evolving/moderate complexity  EVALUATION COMPLEXITY: Moderate   GOALS: Goals reviewed with patient? Yes  SHORT TERM GOALS: (target date for Short term goals are 3 weeks 12/30/2023 )  1.Patient will demonstrate independent use of home exercise program to maintain progress from in clinic treatments. Goal status: Met 01/07/24  LONG TERM GOALS: (target dates for all long term goals are  weeks  8 weeks, 02/03/2024 )   1. Patient will demonstrate/report pain at worst less than or equal to 2/10 to facilitate minimal limitation in daily activity secondary to pain symptoms. Goal status: on going 02/04/2024   2. Patient will demonstrate independent use of home exercise program to facilitate ability to maintain/progress functional gains from skilled physical therapy services. Goal status: on going 02/04/2024   3. Patient will demonstrate Patient specific functional scale avg > or = 8.5 to indicate reduced disability due to condition.  Goal status: on going 02/04/2024   4.  Patient will demonstrate bilateral cervical rotation AROM to >/= 45 degrees s symptoms to facilitate usual head movements for daily activity including driving, self care.   Goal status: on going 02/04/2024   5.   Pt will improve his Left knee MMT to >/= 20 lbs  for flexion and extension to improve functional mobility.  Goal status: on going 02/04/2024    PLAN:  PT FREQUENCY: 1-2x/week  PT DURATION: 8  weeks  Can include 02853- PT Re-evaluation,  97110-Therapeutic exercises, 97530- Therapeutic activity, [id number]- Neuromuscular re-education, [phone]- Self Care, 02859- Manual therapy, [phone]- Gait training, [phone]- Orthotic Fit/training, [phone]- Canalith repositioning, [id number]- Aquatic Therapy, [phone] Electrical stimulation (unattended), [phone]- Electrical stimulation (manual), [id number]- Vasopneumatic device, [id number]- Ultrasound, [id number] Physical performance testing, 97012- Traction (mechanical), [phone]- Ionotophoresis 4mg /ml Dexamethasone , Patient/Family education, Balance training, Stair training, Taping, Dry Needling, Joint mobilization, Joint manipulation, Spinal manipulation, Spinal mobilization, Scar mobilization, Vestibular training, Visual/preceptual remediation/compensation, DME instructions, Cryotherapy, and Moist heat.  All performed as medically necessary.  All included unless contraindicated  PLAN FOR NEXT SESSION: Follow up on injection response.   Ozell Silvan, PT, DPT, OCS, ATC 02/04/24  10:11 AM    PHYSICAL THERAPY DISCHARGE SUMMARY  Visits from Start of Care: 6  Current functional level related to goals / functional outcomes: See note   Remaining deficits: See note   Education / Equipment: HEP  Patient goals were partially met. Patient is being discharged due to not returning since the last visit.  Ozell Silvan, PT, DPT, OCS, ATC 05/01/24  10:35 AM

## 2024-02-06 ENCOUNTER — Ambulatory Visit: Admitting: Orthopedic Surgery

## 2024-02-06 DIAGNOSIS — M170 Bilateral primary osteoarthritis of knee: Secondary | ICD-10-CM | POA: Diagnosis not present

## 2024-02-07 ENCOUNTER — Encounter: Payer: Self-pay | Admitting: Orthopedic Surgery

## 2024-02-07 MED ORDER — TRIAMCINOLONE ACETONIDE 40 MG/ML IJ SUSP
40.0000 mg | INTRAMUSCULAR | Status: AC | PRN
  Administered 2024-02-06: 40 mg via INTRA_ARTICULAR

## 2024-02-07 MED ORDER — BUPIVACAINE HCL 0.25 % IJ SOLN
4.0000 mL | INTRAMUSCULAR | Status: AC | PRN
  Administered 2024-02-06: 4 mL via INTRA_ARTICULAR

## 2024-02-07 MED ORDER — LIDOCAINE HCL 1 % IJ SOLN
5.0000 mL | INTRAMUSCULAR | Status: AC | PRN
  Administered 2024-02-06: 5 mL

## 2024-02-07 NOTE — Progress Notes (Signed)
 Office Visit Note   Patient: Adam Vaughn           Date of Birth: 1976/06/09           MRN: [id number] Visit Date: 02/06/2024 Requested by: Garald Karlynn GAILS, MD [address] [place],  [place] [address] PCP: Plotnikov, Karlynn GAILS, MD  Subjective: Chief Complaint  Patient presents with   Other    Bilateral knee pain-wants injections    HPI: Adam Vaughn is a 48 y.o. male who presents to the office reporting bilateral knee pain left worse than right.  Patient does have known left knee medial meniscal tear.  He has been in physical therapy for well over 3 months.  Doing nonweightbearing quad strengthening exercises.  Still reports some medial joint line tenderness in that left knee.  Has had right knee arthroscopy and debridement of similar tear.  Has done well with that but still has some very mild residual symptoms.  Has other issues ongoing which may require intervention..                ROS: All systems reviewed are negative as they relate to the chief complaint within the history of present illness.  Patient denies fevers or chills.  Assessment & Plan: Visit Diagnoses:  1. Primary osteoarthritis of both knees     Plan: Impression is bilateral knee pain.  Left knee has meniscal pathology which could be addressed arthroscopically.  Patient is going to decide on that but he has tried and failed conservative treatment including more than 6 weeks of physical therapy and injections.  He will let us  know if he wants to schedule that knee arthroscopy and debridement.  He will see how he does with this injection first.  Follow-Up Instructions: No follow-ups on file.   Orders:  No orders of the defined types were placed in this encounter.  No orders of the defined types were placed in this encounter.     Procedures: Large Joint Inj: bilateral knee on 02/06/2024 6:43 AM Indications: diagnostic evaluation, joint swelling and pain Details: 18 G 1.5 in needle,  superolateral approach  Arthrogram: No  Medications (Right): 5 mL lidocaine  1 %; 4 mL bupivacaine  0.25 %; 40 mg triamcinolone  acetonide 40 MG/ML Medications (Left): 5 mL lidocaine  1 %; 4 mL bupivacaine  0.25 %; 40 mg triamcinolone  acetonide 40 MG/ML Outcome: tolerated well, no immediate complications Procedure, treatment alternatives, risks and benefits explained, specific risks discussed. Consent was given by the patient. Immediately prior to procedure a time out was called to verify the correct patient, procedure, equipment, support staff and site/side marked as required. Patient was prepped and draped in the usual sterile fashion.       Clinical Data: No additional findings.  Objective: Vital Signs: There were no vitals taken for this visit.  Physical Exam:  Constitutional: Patient appears well-developed HEENT:  Head: Normocephalic Eyes:EOM are normal Neck: Normal range of motion Cardiovascular: Normal rate Pulmonary/chest: Effort normal Neurologic: Patient is alert Skin: Skin is warm Psychiatric: Patient has normal mood and affect  Ortho Exam: Ortho exam demonstrates full extension and full flexion in both knees.  No effusion in either knee.  Does have some medial joint line tenderness on the left with positive McMurray compression testing for medial compartment pathology.  Collateral cruciate ligaments are stable.  No other masses lymphadenopathy or skin changes noted in that left knee region.  Or right knee region.  Specialty Comments:  NCV & EMG Findings: (07/19/2022) Electrodiagnostic  testing of the right lower extremity and additional studies of the left shows: 1. Bilateral sural and superficial peroneal sensory responses are within normal limits. 2. Bilateral peroneal and tibial motor responses are within normal limits. 3. Bilateral tibial H reflex studies are within normal limits. 4. Chronic motor axon loss changes are seen affecting the rectus femoris and adductor  longus muscles on the right, without accompanying active denervation.  These findings are not present in the left lower extremity.   Impression: 1. 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 2. There is no evidence of a large fiber sensorimotor polyneuropathy affecting the lower extremities.     ___________________________ Tonita Blanch, DO  Imaging: No results found.   PMFS History: 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   Chronic eczematous otitis externa of both ears 05/16/2022   Hypertrophy of inferior nasal turbinate 05/16/2022   Situational depression 05/14/2022   Coronary atherosclerosis 03/26/2022   Dyslipidemia 01/11/2022   Chronic pain syndrome 11/07/2021   Piriformis syndrome 11/07/2021   Bladder neck obstruction 09/04/2021   Asthmatic bronchitis 09/04/2021   Pain in right shoulder 06/29/2021   Meteorism 06/01/2021   Sinus congestion 02/28/2021   Vertigo 02/17/2021   Headache 02/17/2021   Fever 02/17/2021   Falls 02/17/2021   Allergic rhinitis 10/25/2020   Family history of acute polio 10/25/2020   Chest pain, atypical 10/10/2020   S/P laparoscopic cholecystectomy 10/10/2020   Hyperglycemia 10/10/2020   Acute cholecystitis 09/30/2020   Insomnia 09/26/2020   Knee pain 06/23/2020   Low vitamin B12 level 05/31/2020   Ankle sprain 05/31/2020   Constipation 05/31/2020   Degenerative spondylolisthesis 04/11/2020   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   DDD (degenerative disc disease), lumbar 01/20/2020   Left lumbar radiculitis 01/20/2020   Vitamin D  deficiency 01/15/2020   Low back pain 01/12/2020   IBS (irritable bowel syndrome) 01/12/2020   Nephrolithiasis 01/12/2020   Erectile dysfunction 01/12/2020    GERD (gastroesophageal reflux disease) 01/12/2020   Past Medical History:  Diagnosis Date   Arthritis    Chronic back pain    Family history of colon cancer    sister   GERD (gastroesophageal reflux disease)    History of kidney stones    Hyperlipidemia    Hypertension     Family History  Problem Relation Age of Onset   Heart disease Father 25       MI   Hypertension Father    Heart attack Father    Other Father        Small intestine removed but does not know why   Diabetes Sister    Heart disease Sister 76       MI   Colon cancer Sister    Colon polyps Sister    Colon polyps Sister    Healthy Sister    Healthy Sister    Healthy Sister    Other Brother        MVA   Healthy Brother    Healthy Brother    Healthy Brother    Healthy Brother    Healthy Brother    Healthy Brother    Liver disease Neg Hx    Pancreatic cancer Neg Hx    Esophageal cancer Neg Hx    Stomach cancer  Neg Hx     Past Surgical History:  Procedure Laterality Date   BACK SURGERY  04/11/2020   CHOLECYSTECTOMY N/A 10/01/2020   Procedure: LAPAROSCOPIC CHOLECYSTECTOMY;  Surgeon: Curvin Deward MOULD, MD;  Location: [facility];  Service: General;  Laterality: N/A;   COLONOSCOPY  1991   IBS   endocolon  09/15/2021   [facility], GERD   HAND SURGERY Bilateral    SHOULDER SURGERY Right 08/2021   SPINAL CORD STIMULATOR IMPLANT  2022   SPINAL CORD STIMULATOR REMOVAL  2022   TONSILLECTOMY     VASECTOMY     Social History   Occupational History   Not on file  Tobacco Use   Smoking status: Never    Passive exposure: Never   Smokeless tobacco: Never  Vaping Use   Vaping status: Never Used  Substance and Sexual Activity   Alcohol use: Not Currently    Comment: States quit around age 11   Drug use: Never   Sexual activity: Not Currently    Birth control/protection: Surgical    Comment: vastectomy

## 2024-02-11 ENCOUNTER — Encounter: Payer: Self-pay | Admitting: Internal Medicine

## 2024-02-11 ENCOUNTER — Ambulatory Visit: Admitting: Internal Medicine

## 2024-02-11 VITALS — BP 135/84 | HR 73 | Temp 98.5°F | Ht 68.0 in | Wt 198.0 lb

## 2024-02-11 DIAGNOSIS — G8929 Other chronic pain: Secondary | ICD-10-CM | POA: Diagnosis not present

## 2024-02-11 DIAGNOSIS — E559 Vitamin D deficiency, unspecified: Secondary | ICD-10-CM | POA: Diagnosis not present

## 2024-02-11 DIAGNOSIS — J4520 Mild intermittent asthma, uncomplicated: Secondary | ICD-10-CM

## 2024-02-11 DIAGNOSIS — R7989 Other specified abnormal findings of blood chemistry: Secondary | ICD-10-CM | POA: Diagnosis not present

## 2024-02-11 DIAGNOSIS — M545 Low back pain, unspecified: Secondary | ICD-10-CM | POA: Diagnosis not present

## 2024-02-11 MED ORDER — ALBUTEROL SULFATE HFA 108 (90 BASE) MCG/ACT IN AERS: 2.0000 | INHALATION_SPRAY | Freq: Four times a day (QID) | RESPIRATORY_TRACT | 6 refills | Status: DC | PRN

## 2024-02-11 MED ORDER — OXYCODONE HCL 10 MG PO TABS: 10.0000 mg | ORAL_TABLET | Freq: Three times a day (TID) | ORAL | 0 refills | Status: DC | PRN

## 2024-02-11 MED ORDER — OXYCODONE HCL 10 MG PO TABS: 10.0000 mg | ORAL_TABLET | Freq: Four times a day (QID) | ORAL | 0 refills | Status: DC | PRN

## 2024-02-11 NOTE — Progress Notes (Signed)
 Subjective:  Patient ID: Adam Vaughn, male    DOB: [birth year]  Age: 48 y.o. MRN: [id number]  CC: Medical Management of Chronic Issues (2 mnth f/u )   HPI Adam Vaughn presents for LBP, asthma, hypogonadism  Outpatient Medications Prior to Visit  Medication Sig Dispense Refill   Docusate Calcium  (STOOL SOFTENER PO) Take 1 tablet by mouth daily.     esomeprazole  (NEXIUM ) 40 MG capsule Take 1 capsule (40 mg total) by mouth 2 (two) times daily before a meal. 60 capsule 11   ezetimibe  (ZETIA ) 10 MG tablet Take 1 tablet by mouth once daily 90 tablet 2   fluticasone  (FLONASE ) 50 MCG/ACT nasal spray Place 2 sprays into both nostrils daily. 16 g 6   hyoscyamine  (LEVSIN  SL) 0.125 MG SL tablet Take 1 tablet (0.125 mg total) by mouth every 6 (six) hours as needed. 30 tablet 2   methylcellulose (CITRUCEL) oral powder Take 1 packet by mouth daily.     NEEDLE, DISP, 23 G (BD DISP NEEDLE) 23G X 1 MISC Use for intramuscular injections. 50 each 3   NEEDLE, DISP, 25 G (B-D DISP NEEDLE 25GX1) 25G X 1 MISC Use every two weeks to inject Testosterone  medication. 50 each 3   ondansetron  (ZOFRAN ) 4 MG tablet Take 1 tablet (4 mg total) by mouth every 8 (eight) hours as needed. 30 tablet 3   predniSONE  (DELTASONE ) 10 MG tablet Prednisone  10 mg: take 4 tabs a day x 3 days; then 3 tabs a day x 4 days; then 2 tabs a day x 4 days, then 1 tab a day x 6 days, then stop. Take pc. 38 tablet 1   pregabalin  (LYRICA ) 150 MG capsule Take 1 capsule (150 mg total) by mouth 3 (three) times daily. 90 capsule 3   rosuvastatin  (CRESTOR ) 40 MG tablet Take 1 tablet by mouth once daily 90 tablet 2   tadalafil  (CIALIS ) 5 MG tablet Take 5 mg by mouth daily.     tamsulosin  (FLOMAX ) 0.4 MG CAPS capsule Take 1 capsule (0.4 mg total) by mouth daily. 90 capsule 3   testosterone  cypionate (DEPOTESTOSTERONE CYPIONATE) 200 MG/ML injection INJECT 1 ML (CC) INTRAMUSCULARLY EVERY 10 DAYS 9 mL 0   tiZANidine  (ZANAFLEX ) 4 MG tablet  Take 1 tablet (4 mg total) by mouth 3 (three) times daily. 180 tablet 1   triamcinolone  cream (KENALOG ) 0.5 % Apply 1 Application topically 4 (four) times daily. 45 g 1   Vitamin D -Vitamin K (K2 PLUS D3) [id number] MCG-UNIT TABS 1 po daily 100 tablet 3   Oxycodone  HCl 10 MG TABS Take 1 tablet (10 mg total) by mouth 3 (three) times daily as needed. 90 tablet 0   No facility-administered medications prior to visit.    ROS: Review of Systems  Constitutional:  Positive for fatigue. Negative for appetite change and unexpected weight change.  HENT:  Negative for congestion, nosebleeds, sneezing, sore throat and trouble swallowing.   Eyes:  Negative for itching and visual disturbance.  Respiratory:  Negative for cough.   Cardiovascular:  Negative for chest pain, palpitations and leg swelling.  Gastrointestinal:  Negative for abdominal distention, blood in stool, diarrhea and nausea.  Genitourinary:  Negative for frequency and hematuria.  Musculoskeletal:  Positive for back pain and gait problem. Negative for joint swelling and neck pain.  Skin:  Negative for rash.  Neurological:  Negative for dizziness, tremors, speech difficulty and weakness.  Psychiatric/Behavioral:  Positive for dysphoric mood. Negative for agitation, sleep disturbance and  suicidal ideas. The patient is not nervous/anxious.     Objective:  BP 135/84   Pulse 73   Temp 98.5 F (36.9 C) (Oral)   Ht 5' 8 (1.727 m)   Wt 198 lb (89.8 kg)   SpO2 96%   BMI 30.11 kg/m   BP Readings from Last 3 Encounters:  02/11/24 135/84  01/02/24 135/85  12/12/23 118/74    Wt Readings from Last 3 Encounters:  02/11/24 198 lb (89.8 kg)  12/12/23 195 lb (88.5 kg)  11/27/23 197 lb (89.4 kg)    Physical Exam Constitutional:      General: He is not in acute distress.    Appearance: He is well-developed.     Comments: NAD  Eyes:     Conjunctiva/sclera: Conjunctivae normal.     Pupils: Pupils are equal, round, and reactive to light.   Neck:     Thyroid : No thyromegaly.     Vascular: No JVD.  Cardiovascular:     Rate and Rhythm: Normal rate and regular rhythm.     Heart sounds: Normal heart sounds. No murmur heard.    No friction rub. No gallop.  Pulmonary:     Effort: Pulmonary effort is normal. No respiratory distress.     Breath sounds: Normal breath sounds. No wheezing or rales.  Chest:     Chest wall: No tenderness.  Abdominal:     General: Bowel sounds are normal. There is no distension.     Palpations: Abdomen is soft. There is no mass.     Tenderness: There is no abdominal tenderness. There is no guarding or rebound.  Musculoskeletal:        General: No tenderness. Normal range of motion.     Cervical back: Normal range of motion.     Right lower leg: No edema.     Left lower leg: No edema.  Lymphadenopathy:     Cervical: No cervical adenopathy.  Skin:    General: Skin is warm and dry.     Findings: No rash.  Neurological:     Mental Status: He is alert and oriented to person, place, and time.     Cranial Nerves: No cranial nerve deficit.     Motor: Weakness present. No abnormal muscle tone.     Coordination: Coordination normal.     Gait: Gait abnormal.     Deep Tendon Reflexes: Reflexes are normal and symmetric.  Psychiatric:        Behavior: Behavior normal.        Thought Content: Thought content normal.        Judgment: Judgment normal.    Antalgic gait   Lab Results  Component Value Date   WBC 7.8 08/02/2023   HGB 15.4 08/02/2023   HCT 45.9 08/02/2023   PLT 301 08/02/2023   GLUCOSE 90 06/17/2023   CHOL 124 07/25/2023   TRIG 63 07/25/2023   HDL 58 07/25/2023   LDLCALC 53 07/25/2023   ALT 55 (H) 06/17/2023   AST 27 06/17/2023   NA 140 06/17/2023   K 3.8 06/17/2023   CL 107 06/17/2023   CREATININE 0.93 06/17/2023   BUN 12 06/17/2023   CO2 26 06/17/2023   TSH 0.84 06/17/2023   PSA 1.83 09/04/2021   HGBA1C 5.5 09/04/2021    MR Knee Left  Wo Contrast Result Date:  06/12/2023 CLINICAL DATA:  Chronic bilateral knee pain EXAM: MRI OF THE LEFT KNEE WITHOUT CONTRAST TECHNIQUE: Multiplanar, multisequence MR imaging of the knee was  performed. No intravenous contrast was administered. COMPARISON:  None Available. FINDINGS: MENISCI Medial: Oblique tear of the posterior horn-body junction of the medial meniscus extending to the inferior articular surface and extending into the posterior horn. Lateral: Intact. LIGAMENTS Cruciates: ACL and PCL are intact. Collaterals: Medial collateral ligament is intact. Lateral collateral ligament complex is intact. CARTILAGE Patellofemoral: Partial-thickness cartilage loss of the medial patellar facet with cartilage fissuring and subchondral marrow edema. Medial: Mild partial-thickness cartilage loss of the weight-bearing medial femorotibial compartment. Lateral:  No chondral defect. JOINT: No joint effusion. Normal Hoffa's fat-pad. No plical thickening. POPLITEAL FOSSA: Popliteus tendon is intact. No Baker's cyst. EXTENSOR MECHANISM: Intact quadriceps tendon. Mild proximal patellar tendinosis. Intact lateral patellar retinaculum. Intact medial patellar retinaculum. Intact MPFL. BONES: No aggressive osseous lesion. No fracture or dislocation. Other: No fluid collection or hematoma. Muscles are normal. IMPRESSION: 1. Oblique tear of the posterior horn-body junction of the medial meniscus extending to the inferior articular surface and extending into the posterior horn. 2. Partial-thickness cartilage loss of the medial patellar facet with cartilage fissuring and subchondral marrow edema. 3. Mild partial-thickness cartilage loss of the weight-bearing medial femorotibial compartment. Electronically Signed   By: Julaine Blanch M.D.   On: 06/12/2023 08:00    Assessment & Plan:   Problem List Items Addressed This Visit     Asthmatic bronchitis   Proair  MDI prn      Relevant Medications   albuterol  (PROAIR  HFA) 108 (90 Base) MCG/ACT inhaler    Other Relevant Orders   Comprehensive metabolic panel with GFR   CBC with Differential/Platelet   Lipid panel   Low back pain - Primary   On Vit D3+K2 Oxy Rx 10 mg tid prn  Potential benefits of a long term opioids use as well as potential risks (i.e. addiction risk, apnea etc) and complications (i.e. Somnolence, constipation and others) were explained to the patient and were aknowledged. Pregabalin :  one in am, 2 at hs On Testosterone  inj       Relevant Medications   Oxycodone  HCl 10 MG TABS   Oxycodone  HCl 10 MG TABS   Other Relevant Orders   Testosterone    TSH   Comprehensive metabolic panel with GFR   CBC with Differential/Platelet   Lipid panel   Low vitamin B12 level   On B complex      Vitamin D  deficiency   On Vit D         Meds ordered this encounter  Medications   Oxycodone  HCl 10 MG TABS    Sig: Take 1 tablet (10 mg total) by mouth 3 (three) times daily as needed.    Dispense:  90 tablet    Refill:  0    Please fill on or after 02/11/24 Code: M54.50   albuterol  (PROAIR  HFA) 108 (90 Base) MCG/ACT inhaler    Sig: Inhale 2 puffs into the lungs every 6 (six) hours as needed for wheezing or shortness of breath.    Dispense:  1 each    Refill:  6   Oxycodone  HCl 10 MG TABS    Sig: Take 1 tablet (10 mg total) by mouth every 6 (six) hours as needed.    Dispense:  90 tablet    Refill:  0    Please fill on or after 03/12/2024 Code: M54.50      Follow-up: Return in about 3 months (around 05/13/2024).  Marolyn Noel, MD

## 2024-02-11 NOTE — Assessment & Plan Note (Addendum)
 On Vit D3+K2 Oxy Rx 10 mg tid prn  Potential benefits of a long term opioids use as well as potential risks (i.e. addiction risk, apnea etc) and complications (i.e. Somnolence, constipation and others) were explained to the patient and were aknowledged. Pregabalin :  one in am, 2 at hs On Testosterone  inj

## 2024-02-11 NOTE — Assessment & Plan Note (Signed)
 On Vit D

## 2024-02-11 NOTE — Assessment & Plan Note (Signed)
Proair MDI prn

## 2024-02-11 NOTE — Assessment & Plan Note (Signed)
 On B complex

## 2024-02-21 DIAGNOSIS — H5213 Myopia, bilateral: Secondary | ICD-10-CM | POA: Diagnosis not present

## 2024-02-21 DIAGNOSIS — H524 Presbyopia: Secondary | ICD-10-CM | POA: Diagnosis not present

## 2024-02-21 DIAGNOSIS — H04123 Dry eye syndrome of bilateral lacrimal glands: Secondary | ICD-10-CM | POA: Diagnosis not present

## 2024-02-27 ENCOUNTER — Encounter: Payer: Self-pay | Admitting: Orthopedic Surgery

## 2024-02-28 NOTE — Telephone Encounter (Signed)
 Pls put blue sheet on desk for me to fill out thx

## 2024-02-29 ENCOUNTER — Encounter: Payer: Self-pay | Admitting: Internal Medicine

## 2024-03-03 MED ORDER — ESOMEPRAZOLE MAGNESIUM 40 MG PO CPDR: 40.0000 mg | DELAYED_RELEASE_CAPSULE | Freq: Two times a day (BID) | ORAL | 11 refills | Status: AC

## 2024-03-09 ENCOUNTER — Other Ambulatory Visit: Payer: Self-pay

## 2024-03-09 ENCOUNTER — Ambulatory Visit: Admitting: Orthopedic Surgery

## 2024-03-09 DIAGNOSIS — M25511 Pain in right shoulder: Secondary | ICD-10-CM

## 2024-03-09 DIAGNOSIS — R200 Anesthesia of skin: Secondary | ICD-10-CM

## 2024-03-09 DIAGNOSIS — M19011 Primary osteoarthritis, right shoulder: Secondary | ICD-10-CM

## 2024-03-10 ENCOUNTER — Telehealth: Payer: Self-pay

## 2024-03-10 NOTE — Telephone Encounter (Signed)
 Patient had EMG on Right hand July 2025, asking if he needs to repeat testing

## 2024-03-10 NOTE — Telephone Encounter (Signed)
 No need to repeat must be from neck thx/ lauren can you order cspine mri thx

## 2024-03-11 ENCOUNTER — Other Ambulatory Visit: Payer: Self-pay

## 2024-03-11 ENCOUNTER — Encounter: Payer: Self-pay | Admitting: Orthopedic Surgery

## 2024-03-11 DIAGNOSIS — M542 Cervicalgia: Secondary | ICD-10-CM

## 2024-03-11 NOTE — Progress Notes (Unsigned)
 Office Visit Note   Patient: Adam Vaughn           Date of Birth: 05-30-1976           MRN: [id number] Visit Date: 03/09/2024 Requested by: Garald Karlynn GAILS, MD [address] [place],  [place] [address] PCP: Plotnikov, Karlynn GAILS, MD  Subjective: Chief Complaint  Patient presents with   Right Shoulder - Pain    HPI: Adam Vaughn is a 48 y.o. male who presents to the office reporting right shoulder pain.  Having some pain which he localizes to the [facility] joint.  He is set up for knee arthroscopy in October.  Had lumbar ESI with Dr. Eldonna on 01/02/2024.  Has had prior right shoulder biceps tenodesis for SLAP tear.  He is left-hand dominant.  He also describes some numbness and tingling in the left hand in the ulnar distribution.  EMG nerve study in July was normal.  Describes anterior shoulder pain and difficulty lifting a gallon of milk.  Feels like the right hand is weak..                ROS: All systems reviewed are negative as they relate to the chief complaint within the history of present illness.  Patient denies fevers or chills.  Assessment & Plan: Visit Diagnoses:  1. Right shoulder pain, unspecified chronicity   2. Right upper extremity numbness     Plan: Impression is right shoulder pain with possible AC joint symptoms.  AC joint is injected today for diagnostic and therapeutic purposes.  Unclear etiology of this right hand weakness.  The ulnar nerve does not subluxate on that right-hand side but it does right up on the medial epicondyle.  Nerve study was negative for overt ulnar nerve compression.  May require evaluation by her hand surgeon if symptoms do not improve.  Follow-Up Instructions: No follow-ups on file.   Orders:  Orders Placed This Encounter  Procedures   XR Shoulder Right   Ambulatory referral to Physical Medicine Rehab   No orders of the defined types were placed in this encounter.     Procedures: Medium Joint Inj: R acromioclavicular on  03/09/2024 9:48 PM Indications: pain and diagnostic evaluation Details: 25 G 1.5 in needle, superior approach Medications: 0.66 mL bupivacaine  0.25 %; 3 mL lidocaine  1 %; 20 mg triamcinolone  acetonide 40 MG/ML Outcome: tolerated well, no immediate complications Procedure, treatment alternatives, risks and benefits explained, specific risks discussed. Consent was given by the patient. Immediately prior to procedure a time out was called to verify the correct patient, procedure, equipment, support staff and site/side marked as required. Patient was prepped and draped in the usual sterile fashion.       Clinical Data: No additional findings.  Objective: Vital Signs: There were no vitals taken for this visit.  Physical Exam:  Constitutional: Patient appears well-developed HEENT:  Head: Normocephalic Eyes:EOM are normal Neck: Normal range of motion Cardiovascular: Normal rate Pulmonary/chest: Effort normal Neurologic: Patient is alert Skin: Skin is warm Psychiatric: Patient has normal mood and affect  Ortho Exam: Ortho exam demonstrates full symmetric passive and active no definite dorsal ulnar paresthesias right versus left.  No thenar wasting right versus left.  No Popeye deformity on the right shoulder.  Rotator cuff strength is intact infraspinatus supraspinatus and subscap muscle testing.  Specialty Comments:  NCV & EMG Findings: (07/19/2022) Electrodiagnostic testing of the right lower extremity and additional studies of the left shows: 1. Bilateral sural and  superficial peroneal sensory responses are within normal limits. 2. Bilateral peroneal and tibial motor responses are within normal limits. 3. Bilateral tibial H reflex studies are within normal limits. 4. Chronic motor axon loss changes are seen affecting the rectus femoris and adductor longus muscles on the right, without accompanying active denervation.  These findings are not present in the left lower extremity.    Impression: 1. 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 2. There is no evidence of a large fiber sensorimotor polyneuropathy affecting the lower extremities.     ___________________________ Tonita Blanch, DO  Imaging: No results found.   PMFS History: 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   Chronic eczematous otitis externa of both ears 05/16/2022   Hypertrophy of inferior nasal turbinate 05/16/2022   Situational depression 05/14/2022   Coronary atherosclerosis 03/26/2022   Dyslipidemia 01/11/2022   Chronic pain syndrome 11/07/2021   Piriformis syndrome 11/07/2021   Bladder neck obstruction 09/04/2021   Asthmatic bronchitis 09/04/2021   Pain in right shoulder 06/29/2021   Meteorism 06/01/2021   Sinus congestion 02/28/2021   Vertigo 02/17/2021   Headache 02/17/2021   Fever 02/17/2021   Falls 02/17/2021   Allergic rhinitis 10/25/2020   Family history of acute polio 10/25/2020   Chest pain, atypical 10/10/2020   S/P laparoscopic cholecystectomy 10/10/2020   Hyperglycemia 10/10/2020   Acute cholecystitis 09/30/2020   Insomnia 09/26/2020   Knee pain 06/23/2020   Low vitamin B12 level 05/31/2020   Ankle sprain 05/31/2020   Constipation 05/31/2020   Degenerative spondylolisthesis 04/11/2020   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   DDD (degenerative disc disease), lumbar 01/20/2020   Left lumbar radiculitis 01/20/2020   Vitamin D  deficiency 01/15/2020   Low back pain 01/12/2020   IBS (irritable bowel syndrome) 01/12/2020   Nephrolithiasis 01/12/2020   Erectile dysfunction 01/12/2020   GERD (gastroesophageal reflux disease) 01/12/2020   Past Medical History:  Diagnosis Date   Arthritis    Chronic back pain     Family history of colon cancer    sister   GERD (gastroesophageal reflux disease)    History of kidney stones    Hyperlipidemia    Hypertension     Family History  Problem Relation Age of Onset   Heart disease Father 103       MI   Hypertension Father    Heart attack Father    Other Father        Small intestine removed but does not know why   Diabetes Sister    Heart disease Sister 80       MI   Colon cancer Sister    Colon polyps Sister    Colon polyps Sister    Healthy Sister    Healthy Sister    Healthy Sister    Other Brother        MVA   Healthy Brother    Healthy Brother    Healthy Brother    Healthy Brother    Healthy Brother    Healthy Brother    Liver disease Neg Hx    Pancreatic cancer Neg Hx    Esophageal cancer Neg Hx    Stomach cancer Neg Hx     Past Surgical History:  Procedure Laterality Date   BACK SURGERY  04/11/2020   CHOLECYSTECTOMY N/A 10/01/2020   Procedure: LAPAROSCOPIC CHOLECYSTECTOMY;  Surgeon: Curvin Deward MOULD, MD;  Location: [facility];  Service: General;  Laterality: N/A;   COLONOSCOPY  1991   IBS   endocolon  09/15/2021   [facility], GERD   HAND SURGERY Bilateral    SHOULDER SURGERY Right 08/2021   SPINAL CORD STIMULATOR IMPLANT  2022   SPINAL CORD STIMULATOR REMOVAL  2022   TONSILLECTOMY     VASECTOMY     Social History   Occupational History   Not on file  Tobacco Use   Smoking status: Never    Passive exposure: Never   Smokeless tobacco: Never  Vaping Use   Vaping status: Never Used  Substance and Sexual Activity   Alcohol use: Not Currently    Comment: States quit around age 89   Drug use: Never   Sexual activity: Not Currently    Birth control/protection: Surgical    Comment: vastectomy

## 2024-03-11 NOTE — Telephone Encounter (Signed)
Cervical MRI ordered.

## 2024-03-12 ENCOUNTER — Encounter: Payer: Self-pay | Admitting: Orthopedic Surgery

## 2024-03-12 MED ORDER — TRIAMCINOLONE ACETONIDE 40 MG/ML IJ SUSP
20.0000 mg | INTRAMUSCULAR | Status: AC | PRN
  Administered 2024-03-09: 20 mg via INTRA_ARTICULAR

## 2024-03-12 MED ORDER — BUPIVACAINE HCL 0.25 % IJ SOLN
0.6600 mL | INTRAMUSCULAR | Status: AC | PRN
  Administered 2024-03-09: .66 mL via INTRA_ARTICULAR

## 2024-03-12 MED ORDER — LIDOCAINE HCL 1 % IJ SOLN
3.0000 mL | INTRAMUSCULAR | Status: AC | PRN
  Administered 2024-03-09: 3 mL

## 2024-03-24 ENCOUNTER — Ambulatory Visit
Admission: RE | Admit: 2024-03-24 | Discharge: 2024-03-24 | Disposition: A | Discharge status: Home / Self Care | Source: Ambulatory Visit | Attending: Orthopedic Surgery | Admitting: Orthopedic Surgery

## 2024-03-24 DIAGNOSIS — M4802 Spinal stenosis, cervical region: Secondary | ICD-10-CM | POA: Diagnosis not present

## 2024-03-24 DIAGNOSIS — M542 Cervicalgia: Secondary | ICD-10-CM

## 2024-03-24 DIAGNOSIS — M50221 Other cervical disc displacement at C4-C5 level: Secondary | ICD-10-CM | POA: Diagnosis not present

## 2024-03-25 ENCOUNTER — Encounter: Payer: Self-pay | Admitting: *Deleted

## 2024-03-25 ENCOUNTER — Encounter: Admitting: Physical Medicine and Rehabilitation

## 2024-03-25 NOTE — Progress Notes (Signed)
 Adam Vaughn                                          MRN: [id number]   03/25/2024   The [facility] Quality Team Specialist reviewed this patient medical record for the purposes of chart review for care gap closure. The following were reviewed: abstraction for care gap closure-colorectal cancer screening.    [facility] Quality Team

## 2024-03-31 ENCOUNTER — Encounter: Payer: Self-pay | Admitting: Internal Medicine

## 2024-04-01 ENCOUNTER — Other Ambulatory Visit: Payer: Self-pay

## 2024-04-01 MED ORDER — TRIAMCINOLONE ACETONIDE 0.5 % EX CREA: 1.0000 | TOPICAL_CREAM | Freq: Four times a day (QID) | CUTANEOUS | 1 refills | Status: AC

## 2024-04-09 ENCOUNTER — Ambulatory Visit (INDEPENDENT_AMBULATORY_CARE_PROVIDER_SITE_OTHER): Admitting: Internal Medicine

## 2024-04-09 ENCOUNTER — Encounter: Payer: Self-pay | Admitting: Internal Medicine

## 2024-04-09 VITALS — BP 130/82 | HR 83 | Temp 98.6°F | Ht 68.0 in | Wt 197.4 lb

## 2024-04-09 DIAGNOSIS — M545 Low back pain, unspecified: Secondary | ICD-10-CM

## 2024-04-09 DIAGNOSIS — G8929 Other chronic pain: Secondary | ICD-10-CM | POA: Diagnosis not present

## 2024-04-09 DIAGNOSIS — J4520 Mild intermittent asthma, uncomplicated: Secondary | ICD-10-CM

## 2024-04-09 DIAGNOSIS — E291 Testicular hypofunction: Secondary | ICD-10-CM | POA: Diagnosis not present

## 2024-04-09 DIAGNOSIS — S61012A Laceration without foreign body of left thumb without damage to nail, initial encounter: Secondary | ICD-10-CM

## 2024-04-09 DIAGNOSIS — R7989 Other specified abnormal findings of blood chemistry: Secondary | ICD-10-CM

## 2024-04-09 DIAGNOSIS — R739 Hyperglycemia, unspecified: Secondary | ICD-10-CM | POA: Diagnosis not present

## 2024-04-09 LAB — CBC WITH DIFFERENTIAL/PLATELET
Basophils Absolute: 0 K/uL (ref 0.0–0.1)
Basophils Relative: 0.7 % (ref 0.0–3.0)
Eosinophils Absolute: 0.1 K/uL (ref 0.0–0.7)
Eosinophils Relative: 1.3 % (ref 0.0–5.0)
HCT: 47.9 % (ref 39.0–52.0)
Hemoglobin: 16.1 g/dL (ref 13.0–17.0)
Lymphocytes Relative: 20.5 % (ref 12.0–46.0)
Lymphs Abs: 1.2 K/uL (ref 0.7–4.0)
MCHC: 33.5 g/dL (ref 30.0–36.0)
MCV: 88.3 fl (ref 78.0–100.0)
Monocytes Absolute: 0.6 K/uL (ref 0.1–1.0)
Monocytes Relative: 10.1 % (ref 3.0–12.0)
Neutro Abs: 3.9 K/uL (ref 1.4–7.7)
Neutrophils Relative %: 67.4 % (ref 43.0–77.0)
Platelets: 201 K/uL (ref 150.0–400.0)
RBC: 5.43 Mil/uL (ref 4.22–5.81)
RDW: 12.5 % (ref 11.5–15.5)
WBC: 5.7 K/uL (ref 4.0–10.5)

## 2024-04-09 LAB — COMPREHENSIVE METABOLIC PANEL WITH GFR
ALT: 49 U/L (ref 0–53)
AST: 24 U/L (ref 0–37)
Albumin: 4.5 g/dL (ref 3.5–5.2)
Alkaline Phosphatase: 76 U/L (ref 39–117)
BUN: 12 mg/dL (ref 6–23)
CO2: 26 meq/L (ref 19–32)
Calcium: 8.9 mg/dL (ref 8.4–10.5)
Chloride: 106 meq/L (ref 96–112)
Creatinine, Ser: 0.86 mg/dL (ref 0.40–1.50)
GFR: 102.32 mL/min (ref >60.00)
Glucose, Bld: 80 mg/dL (ref 70–99)
Potassium: 3.7 meq/L (ref 3.5–5.1)
Sodium: 140 meq/L (ref 135–145)
Total Bilirubin: 1.2 mg/dL (ref 0.2–1.2)
Total Protein: 6.7 g/dL (ref 6.0–8.3)

## 2024-04-09 LAB — LIPID PANEL
Cholesterol: 110 mg/dL (ref 0–200)
HDL: 52.1 mg/dL (ref >39.00)
LDL Cholesterol: 32 mg/dL (ref 0–99)
NonHDL: 58.28
Total CHOL/HDL Ratio: 2
Triglycerides: 131 mg/dL (ref 0.0–149.0)
VLDL: 26.2 mg/dL (ref 0.0–40.0)

## 2024-04-09 LAB — TESTOSTERONE: Testosterone: 153.73 ng/dL — ABNORMAL LOW (ref 300.00–890.00)

## 2024-04-09 LAB — TSH: TSH: 1.54 u[IU]/mL (ref 0.35–5.50)

## 2024-04-09 MED ORDER — TESTOSTERONE CYPIONATE 200 MG/ML IM SOLN
INTRAMUSCULAR | 0 refills | Status: AC
Start: 2024-04-09 — End: ?

## 2024-04-09 MED ORDER — OXYCODONE HCL 10 MG PO TABS: 10.0000 mg | ORAL_TABLET | Freq: Four times a day (QID) | ORAL | 0 refills | Status: DC | PRN

## 2024-04-09 MED ORDER — OXYCODONE HCL 10 MG PO TABS: 10.0000 mg | ORAL_TABLET | Freq: Three times a day (TID) | ORAL | 0 refills | Status: DC | PRN

## 2024-04-09 NOTE — Assessment & Plan Note (Signed)
 On Vit D3+K2 Oxy Rx 10 mg tid prn  Potential benefits of a long term opioids use as well as potential risks (i.e. addiction risk, apnea etc) and complications (i.e. Somnolence, constipation and others) were explained to the patient and were aknowledged. Pregabalin :  one in am, 2 at hs On Testosterone  inj

## 2024-04-09 NOTE — Assessment & Plan Note (Signed)
 Check labs

## 2024-04-09 NOTE — Assessment & Plan Note (Signed)
Use testosterone at the same dose 200 mg every 10 days (not every 14 days) IM.  Potential benefits of a long term sex steroid  use as well as potential risks  and complications were explained to the patient and were aknowledged.

## 2024-04-09 NOTE — Assessment & Plan Note (Signed)
 On B12

## 2024-04-09 NOTE — Assessment & Plan Note (Signed)
 See procedure

## 2024-04-09 NOTE — Progress Notes (Signed)
 Subjective:  Patient ID: Adam Vaughn, male    DOB: [birth year]  Age: 48 y.o. MRN: [id number]  CC: Annual Exam   HPI Adam Vaughn presents for chronic pain, LBP C/o L thumb knife cut  Outpatient Medications Prior to Visit  Medication Sig Dispense Refill   albuterol  (PROAIR  HFA) 108 (90 Base) MCG/ACT inhaler Inhale 2 puffs into the lungs every 6 (six) hours as needed for wheezing or shortness of breath. 1 each 6   Docusate Calcium  (STOOL SOFTENER PO) Take 1 tablet by mouth daily.     esomeprazole  (NEXIUM ) 40 MG capsule Take 1 capsule (40 mg total) by mouth 2 (two) times daily before a meal. 60 capsule 11   ezetimibe  (ZETIA ) 10 MG tablet Take 1 tablet by mouth once daily 90 tablet 2   fluticasone  (FLONASE ) 50 MCG/ACT nasal spray Place 2 sprays into both nostrils daily. 16 g 6   hyoscyamine  (LEVSIN  SL) 0.125 MG SL tablet Take 1 tablet (0.125 mg total) by mouth every 6 (six) hours as needed. 30 tablet 2   methylcellulose (CITRUCEL) oral powder Take 1 packet by mouth daily.     NEEDLE, DISP, 23 G (BD DISP NEEDLE) 23G X 1 MISC Use for intramuscular injections. 50 each 3   NEEDLE, DISP, 25 G (B-D DISP NEEDLE 25GX1) 25G X 1 MISC Use every two weeks to inject Testosterone  medication. 50 each 3   ondansetron  (ZOFRAN ) 4 MG tablet Take 1 tablet (4 mg total) by mouth every 8 (eight) hours as needed. 30 tablet 3   predniSONE  (DELTASONE ) 10 MG tablet Prednisone  10 mg: take 4 tabs a day x 3 days; then 3 tabs a day x 4 days; then 2 tabs a day x 4 days, then 1 tab a day x 6 days, then stop. Take pc. 38 tablet 1   pregabalin  (LYRICA ) 150 MG capsule Take 1 capsule (150 mg total) by mouth 3 (three) times daily. 90 capsule 3   rosuvastatin  (CRESTOR ) 40 MG tablet Take 1 tablet by mouth once daily 90 tablet 2   tadalafil  (CIALIS ) 5 MG tablet Take 5 mg by mouth daily.     tamsulosin  (FLOMAX ) 0.4 MG CAPS capsule Take 1 capsule (0.4 mg total) by mouth daily. 90 capsule 3   tiZANidine  (ZANAFLEX ) 4 MG  tablet Take 1 tablet (4 mg total) by mouth 3 (three) times daily. 180 tablet 1   triamcinolone  cream (KENALOG ) 0.5 % Apply 1 Application topically 4 (four) times daily. 45 g 1   Vitamin D -Vitamin K (K2 PLUS D3) [phone] MCG-UNIT TABS 1 po daily 100 tablet 3   Oxycodone  HCl 10 MG TABS Take 1 tablet (10 mg total) by mouth 3 (three) times daily as needed. 90 tablet 0   Oxycodone  HCl 10 MG TABS Take 1 tablet (10 mg total) by mouth every 6 (six) hours as needed. 90 tablet 0   testosterone  cypionate (DEPOTESTOSTERONE CYPIONATE) 200 MG/ML injection INJECT 1 ML (CC) INTRAMUSCULARLY EVERY 10 DAYS 9 mL 0   No facility-administered medications prior to visit.    ROS: Review of Systems  Constitutional:  Negative for appetite change, fatigue and unexpected weight change.  HENT:  Negative for congestion, nosebleeds, sneezing, sore throat and trouble swallowing.   Eyes:  Negative for itching and visual disturbance.  Respiratory:  Negative for cough.   Cardiovascular:  Negative for chest pain, palpitations and leg swelling.  Gastrointestinal:  Negative for abdominal distention, blood in stool, diarrhea and nausea.  Genitourinary:  Negative  for frequency and hematuria.  Musculoskeletal:  Positive for back pain and gait problem. Negative for joint swelling and neck pain.  Skin:  Negative for rash.  Neurological:  Negative for dizziness, tremors, speech difficulty and weakness.  Psychiatric/Behavioral:  Negative for agitation, dysphoric mood, sleep disturbance and suicidal ideas. The patient is not nervous/anxious.     Objective:  BP 130/82   Pulse 83   Temp 98.6 F (37 C) (Oral)   Ht 5' 8 (1.727 m)   Wt 197 lb 6.4 oz (89.5 kg)   SpO2 98%   BMI 30.01 kg/m   BP Readings from Last 3 Encounters:  04/09/24 130/82  02/11/24 135/84  01/02/24 135/85    Wt Readings from Last 3 Encounters:  04/09/24 197 lb 6.4 oz (89.5 kg)  02/11/24 198 lb (89.8 kg)  12/12/23 195 lb (88.5 kg)    Physical  Exam Constitutional:      General: He is not in acute distress.    Appearance: Normal appearance. He is well-developed.     Comments: NAD  Eyes:     Conjunctiva/sclera: Conjunctivae normal.     Pupils: Pupils are equal, round, and reactive to light.  Neck:     Thyroid : No thyromegaly.     Vascular: No JVD.  Cardiovascular:     Rate and Rhythm: Normal rate and regular rhythm.     Heart sounds: Normal heart sounds. No murmur heard.    No friction rub. No gallop.  Pulmonary:     Effort: Pulmonary effort is normal. No respiratory distress.     Breath sounds: Normal breath sounds. No wheezing or rales.  Chest:     Chest wall: No tenderness.  Abdominal:     General: Bowel sounds are normal. There is no distension.     Palpations: Abdomen is soft. There is no mass.     Tenderness: There is no abdominal tenderness. There is no guarding or rebound.  Musculoskeletal:        General: No tenderness. Normal range of motion.     Cervical back: Normal range of motion.  Lymphadenopathy:     Cervical: No cervical adenopathy.  Skin:    General: Skin is warm and dry.     Findings: No rash.  Neurological:     Mental Status: He is alert and oriented to person, place, and time.     Cranial Nerves: No cranial nerve deficit.     Motor: No abnormal muscle tone.     Coordination: Coordination normal.     Gait: Gait normal.     Deep Tendon Reflexes: Reflexes are normal and symmetric.  Psychiatric:        Behavior: Behavior normal.        Thought Content: Thought content normal.        Judgment: Judgment normal.   L thumb lacer repair  Lab Results  Component Value Date   WBC 5.7 04/09/2024   HGB 16.1 04/09/2024   HCT 47.9 04/09/2024   PLT 201.0 04/09/2024   GLUCOSE 80 04/09/2024   CHOL 110 04/09/2024   TRIG 131.0 04/09/2024   HDL 52.10 04/09/2024   LDLCALC 32 04/09/2024   ALT 49 04/09/2024   AST 24 04/09/2024   NA 140 04/09/2024   K 3.7 04/09/2024   CL 106 04/09/2024   CREATININE  0.86 04/09/2024   BUN 12 04/09/2024   CO2 26 04/09/2024   TSH 1.54 04/09/2024   PSA 1.83 09/04/2021   HGBA1C 5.5 09/04/2021  MR Cervical Spine w/o contrast Result Date: 03/25/2024 CLINICAL DATA:  Provided history: Cervicalgia. Neck pain, chronic. Additional history provided by the scanning technologist: The patient reports neck pain rating down right arm for 3 months. EXAM: MRI CERVICAL SPINE WITHOUT CONTRAST TECHNIQUE: Multiplanar, multisequence MR imaging of the cervical spine was performed. No intravenous contrast was administered. COMPARISON:  Cervical spine radiographs 12/02/2023. FINDINGS: Alignment: No significant spondylolisthesis. Vertebrae: Cervical vertebral body height is maintained. No significant marrow edema or focal worrisome marrow lesion. Cord: No signal abnormality identified within the cervical spinal cord. Mild flattening of the ventral spinal cord at C5-C6 as described below. Posterior Fossa, vertebral arteries, paraspinal tissues: No abnormality identified within included portions of the posterior fossa. Flow voids preserved within visible portions of the cervical vertebral arteries. No paraspinal mass or collection. Disc levels: No more than mild disc degeneration within the cervical spine. C2-C3: No significant disc herniation or stenosis. C3-C4: Slight disc bulge. Mild bilateral uncovertebral hypertrophy. No significant spinal canal or foraminal stenosis. C4-C5: Slight disc bulge. No significant spinal canal or foraminal stenosis. C5-C6: A central disc protrusion results in mild spinal canal stenosis, focally effacing the ventral thecal sac and slightly flattening the ventral aspect of the spinal cord. No significant foraminal stenosis. C6-C7: No significant disc herniation or stenosis. C7-T1: This level is imaged in the sagittal plane only. No significant disc herniation or stenosis. Other: Partially imaged mucous retention cyst within the left maxillary sinus, measuring at  least 2.6 cm. IMPRESSION: 1. Cervical spondylosis as outlined within the body of the report. 2. At C5-C6, a central disc protrusion results in mild spinal canal stenosis, focally effacing the ventral thecal sac and slightly flattening the ventral aspect of the spinal cord. 3. No significant spinal canal stenosis at the remaining levels. 4. No significant foraminal stenosis. 5. No more than mild disc degeneration. 6. Partially imaged left maxillary sinus mucous retention cyst (measuring at least 2.6 cm). Electronically Signed   By: Rockey Childs D.O.   On: 03/25/2024 19:52    Assessment & Plan:   Problem List Items Addressed This Visit     Asthmatic bronchitis   Hyperglycemia   Check labs      Hypogonadism male   Use testosterone  at the same dose 200 mg every 10 days (not every 14 days) IM.  Potential benefits of a long term sex steroid  use as well as potential risks  and complications were explained to the patient and were aknowledged.`      Relevant Medications   testosterone  cypionate (DEPOTESTOSTERONE CYPIONATE) 200 MG/ML injection   Low back pain   On Vit D3+K2 Oxy Rx 10 mg tid prn  Potential benefits of a long term opioids use as well as potential risks (i.e. addiction risk, apnea etc) and complications (i.e. Somnolence, constipation and others) were explained to the patient and were aknowledged. Pregabalin :  one in am, 2 at hs On Testosterone  inj       Relevant Medications   Oxycodone  HCl 10 MG TABS   Oxycodone  HCl 10 MG TABS   Low vitamin B12 level   On B12      Thumb laceration, left, initial encounter - Primary   See procedure         Meds ordered this encounter  Medications   Oxycodone  HCl 10 MG TABS    Sig: Take 1 tablet (10 mg total) by mouth every 6 (six) hours as needed.    Dispense:  90 tablet    Refill:  0    Please fill on or after 05/11/2024 Code: M54.50   testosterone  cypionate (DEPOTESTOSTERONE CYPIONATE) 200 MG/ML injection    Sig: INJECT 1 ML  (CC) INTRAMUSCULARLY EVERY 10 DAYS    Dispense:  9 mL    Refill:  0   Oxycodone  HCl 10 MG TABS    Sig: Take 1 tablet (10 mg total) by mouth 3 (three) times daily as needed.    Dispense:  90 tablet    Refill:  0    Please fill on or after 04/11/24 Code: M54.50      Follow-up: Return in about 3 months (around 07/10/2024) for a follow-up visit.  Marolyn Noel, MD

## 2024-04-13 ENCOUNTER — Ambulatory Visit: Payer: Self-pay | Admitting: Internal Medicine

## 2024-04-21 ENCOUNTER — Encounter: Payer: Self-pay | Admitting: Orthopedic Surgery

## 2024-04-21 ENCOUNTER — Other Ambulatory Visit: Payer: Self-pay | Admitting: Cardiology

## 2024-04-21 DIAGNOSIS — M542 Cervicalgia: Secondary | ICD-10-CM

## 2024-04-21 NOTE — Telephone Encounter (Signed)
yes thx

## 2024-04-27 ENCOUNTER — Encounter: Payer: Self-pay | Admitting: Orthopedic Surgery

## 2024-04-27 ENCOUNTER — Other Ambulatory Visit: Payer: Self-pay | Admitting: Surgical

## 2024-04-27 DIAGNOSIS — S83232A Complex tear of medial meniscus, current injury, left knee, initial encounter: Secondary | ICD-10-CM | POA: Diagnosis not present

## 2024-04-27 DIAGNOSIS — X58XXXA Exposure to other specified factors, initial encounter: Secondary | ICD-10-CM | POA: Diagnosis not present

## 2024-04-27 DIAGNOSIS — Y999 Unspecified external cause status: Secondary | ICD-10-CM | POA: Diagnosis not present

## 2024-04-27 DIAGNOSIS — G8918 Other acute postprocedural pain: Secondary | ICD-10-CM | POA: Diagnosis not present

## 2024-04-27 MED ORDER — ASPIRIN 81 MG PO CHEW: 81.0000 mg | CHEWABLE_TABLET | Freq: Two times a day (BID) | ORAL | 0 refills | Status: DC

## 2024-04-27 MED ORDER — OXYCODONE HCL 10 MG PO TABS: 10.0000 mg | ORAL_TABLET | ORAL | 0 refills | Status: DC | PRN

## 2024-05-04 ENCOUNTER — Encounter: Payer: Self-pay | Admitting: Radiology

## 2024-05-04 ENCOUNTER — Encounter: Admitting: Surgical

## 2024-05-06 ENCOUNTER — Ambulatory Visit (INDEPENDENT_AMBULATORY_CARE_PROVIDER_SITE_OTHER): Admitting: Surgical

## 2024-05-06 ENCOUNTER — Encounter: Payer: Self-pay | Admitting: Podiatry

## 2024-05-06 ENCOUNTER — Encounter: Payer: Self-pay | Admitting: Surgical

## 2024-05-06 DIAGNOSIS — Z9889 Other specified postprocedural states: Secondary | ICD-10-CM

## 2024-05-06 NOTE — Progress Notes (Signed)
 Post-Op Visit Note   Patient: Adam Vaughn           Date of Birth: May 15, 1976           MRN: [id number] Visit Date: 05/06/2024 PCP: Garald Karlynn GAILS, MD   Assessment & Plan:  Chief Complaint:  Chief Complaint  Patient presents with   Left Knee - Follow-up    Left knee scope 04/27/2024   Visit Diagnoses: No diagnosis found.  Plan: Adam Vaughn is a 48 y.o. male who presents s/p left knee arthroscopy on 04/27/2024.  Doing well overall.  They deny any calf pain, shortness of breath, chest pain, abdominal pain.  Pain is overall controlled and taking oxycodone  per pain management for pain control.  Taking aspirin  for DVT prophylaxis.  Ambulating with cane.   On exam, patient has range of motion 5 degrees extension to 70 degrees of knee flexion.  Incisions are healing well without evidence of infection or dehiscence. Sutures intact and were removed today and replaced with steri-strips.  2+ DP pulse of the operative extremity.  No calf tenderness, negative Homans' sign.  Able to perform straight leg raise.  Intact ankle dorsiflexion. No significant knee effusion  Plan is continue with knee range of motion exercises.  Okay to start stationary bike and walking program.  Follow-up for clinical recheck in 4 weeks with Dr. Addie or myself.  Goal at his next appointment is to easily extend to 0 degrees and easily flex past 90 degrees.  Patient understands.  Call with any concerns...    Follow-Up Instructions: No follow-ups on file.   Orders:  No orders of the defined types were placed in this encounter.  No orders of the defined types were placed in this encounter.   Imaging: No results found.  PMFS History: Patient Active Problem List   Diagnosis Date Noted   Thumb laceration, left, initial encounter 04/09/2024   Arthralgia 12/12/2023   Contact dermatitis 06/17/2023   Hematuria 01/08/2023   History of kidney stones 01/08/2023   Hypokalemia 12/10/2022   Foot pain,  bilateral 11/12/2022   Hypogonadism male 09/19/2022   Chronic eczematous otitis externa of both ears 05/16/2022   Hypertrophy of inferior nasal turbinate 05/16/2022   Situational depression 05/14/2022   Coronary atherosclerosis 03/26/2022   Dyslipidemia 01/11/2022   Chronic pain syndrome 11/07/2021   Piriformis syndrome 11/07/2021   Bladder neck obstruction 09/04/2021   Asthmatic bronchitis 09/04/2021   Pain in right shoulder 06/29/2021   Meteorism 06/01/2021   Sinus congestion 02/28/2021   Vertigo 02/17/2021   Headache 02/17/2021   Fever 02/17/2021   Falls 02/17/2021   Allergic rhinitis 10/25/2020   Family history of acute polio 10/25/2020   Chest pain, atypical 10/10/2020   S/P laparoscopic cholecystectomy 10/10/2020   Hyperglycemia 10/10/2020   Acute cholecystitis 09/30/2020   Insomnia 09/26/2020   Knee pain 06/23/2020   Low vitamin B12 level 05/31/2020   Ankle sprain 05/31/2020   Constipation 05/31/2020   Degenerative spondylolisthesis 04/11/2020   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   DDD (degenerative disc disease), lumbar 01/20/2020   Left lumbar radiculitis 01/20/2020   Vitamin D  deficiency 01/15/2020   Low back pain 01/12/2020   IBS (irritable bowel syndrome) 01/12/2020   Nephrolithiasis 01/12/2020   Erectile dysfunction 01/12/2020   GERD (gastroesophageal reflux disease) 01/12/2020   Past Medical History:  Diagnosis Date   Arthritis    Chronic back pain    Family history of colon cancer  sister   GERD (gastroesophageal reflux disease)    History of kidney stones    Hyperlipidemia    Hypertension     Family History  Problem Relation Age of Onset   Heart disease Father 80       MI   Hypertension Father    Heart attack Father    Other Father        Small intestine removed but does not know why   Diabetes Sister    Heart disease Sister 58       MI   Colon cancer Sister    Colon polyps Sister    Colon polyps  Sister    Healthy Sister    Healthy Sister    Healthy Sister    Other Brother        MVA   Healthy Brother    Healthy Brother    Healthy Brother    Healthy Brother    Healthy Brother    Healthy Brother    Liver disease Neg Hx    Pancreatic cancer Neg Hx    Esophageal cancer Neg Hx    Stomach cancer Neg Hx     Past Surgical History:  Procedure Laterality Date   BACK SURGERY  04/11/2020   CHOLECYSTECTOMY N/A 10/01/2020   Procedure: LAPAROSCOPIC CHOLECYSTECTOMY;  Surgeon: Curvin Deward MOULD, MD;  Location: [facility];  Service: General;  Laterality: N/A;   COLONOSCOPY  1991   IBS   endocolon  09/15/2021   [facility], GERD   HAND SURGERY Bilateral    SHOULDER SURGERY Right 08/2021   SPINAL CORD STIMULATOR IMPLANT  2022   SPINAL CORD STIMULATOR REMOVAL  2022   TONSILLECTOMY     VASECTOMY     Social History   Occupational History   Not on file  Tobacco Use   Smoking status: Never    Passive exposure: Never   Smokeless tobacco: Never  Vaping Use   Vaping status: Never Used  Substance and Sexual Activity   Alcohol use: Not Currently    Comment: States quit around age 28   Drug use: Never   Sexual activity: Not Currently    Birth control/protection: Surgical    Comment: vastectomy

## 2024-05-10 NOTE — Telephone Encounter (Signed)
 If still hurting he can make appointment

## 2024-05-13 ENCOUNTER — Telehealth: Payer: Self-pay | Admitting: Physical Medicine and Rehabilitation

## 2024-05-13 ENCOUNTER — Encounter: Payer: Self-pay | Admitting: Physical Medicine and Rehabilitation

## 2024-05-13 NOTE — Telephone Encounter (Signed)
 Patient called and said he needs an appointment for back injection. CB#[phone]

## 2024-05-18 ENCOUNTER — Ambulatory Visit: Admitting: Physical Medicine and Rehabilitation

## 2024-05-18 ENCOUNTER — Other Ambulatory Visit: Payer: Self-pay

## 2024-05-18 VITALS — BP 145/90 | HR 81

## 2024-05-18 DIAGNOSIS — M5412 Radiculopathy, cervical region: Secondary | ICD-10-CM | POA: Diagnosis not present

## 2024-05-18 MED ORDER — METHYLPREDNISOLONE ACETATE 40 MG/ML IJ SUSP
40.0000 mg | Freq: Once | INTRAMUSCULAR | Status: AC
  Administered 2024-05-18: 40 mg

## 2024-05-18 NOTE — Procedures (Signed)
 Cervical Epidural Steroid Injection - Interlaminar Approach with Fluoroscopic Guidance  Patient: Adam Vaughn      Date of Birth: [birth year] MRN: [id number] PCP: Garald Karlynn GAILS, MD      Visit Date: 05/18/2024   Universal Protocol:    Date/Time: 11/17/259:46 AM  Consent Given By: the patient  Position: PRONE  Additional Comments: Vital signs were monitored before and after the procedure. Patient was prepped and draped in the usual sterile fashion. The correct patient, procedure, and site was verified.   Injection Procedure Details:   Procedure diagnoses: Cervical radiculopathy [M54.12]    Meds Administered:  Meds ordered this encounter  Medications   methylPREDNISolone  acetate (DEPO-MEDROL ) injection 40 mg     Laterality: Right  Location/Site: C7-T1  Needle: 3.5 in., 20 ga. Tuohy  Needle Placement: Paramedian epidural space  Findings:  -Comments: Excellent flow of contrast into the epidural space.  Procedure Details: Using a paramedian approach from the side mentioned above, the region overlying the inferior lamina was localized under fluoroscopic visualization and the soft tissues overlying this structure were infiltrated with 4 ml. of 1% Lidocaine  without Epinephrine . A # 20 gauge, Tuohy needle was inserted into the epidural space using a paramedian approach.  The epidural space was localized using loss of resistance along with contralateral oblique bi-planar fluoroscopic views.  After negative aspirate for air, blood, and CSF, a 2 ml. volume of Isovue -250 was injected into the epidural space and the flow of contrast was observed. Radiographs were obtained for documentation purposes.   The injectate was administered into the level noted above.  Additional Comments:  The patient tolerated the procedure well Dressing: 2 x 2 sterile gauze and Band-Aid    Post-procedure details: Patient was observed during the procedure. Post-procedure instructions were  reviewed.  Patient left the clinic in stable condition.

## 2024-05-18 NOTE — Progress Notes (Signed)
 Pain Scale   Average Pain 5 Patient advising he has chronic neck pain radiating to right shoulder. Patient advising his pain is chronic. Patient also advising he is having some lower back pain        +Driver, -BT, -Dye Allergies.

## 2024-05-18 NOTE — Progress Notes (Signed)
 Adam Vaughn - 48 y.o. male MRN [id number]  Date of birth: 09-21-1975  Office Visit Note: Visit Date: 05/18/2024 PCP: Garald Karlynn GAILS, MD Referred by: Garald Karlynn GAILS, MD  Subjective: Chief Complaint  Patient presents with   Neck - Pain   HPI:  Adam Vaughn is a 48 y.o. male who comes in today for planned repeat Right C7-T1  Cervical Interlaminar epidural steroid injection with fluoroscopic guidance.  The patient has failed conservative care including home exercise, medications, time and activity modification.  This injection will be diagnostic and hopefully therapeutic.  Please see requesting physician notes for further details and justification. Patient received more than 50% pain relief from prior injection.   Referring: Dr. JUDITHANN Hamilton Dean   ROS Otherwise per HPI.  Assessment & Plan: Visit Diagnoses:    ICD-10-CM   1. Cervical radiculopathy  M54.12 XR C-ARM NO REPORT    Epidural Steroid injection    methylPREDNISolone  acetate (DEPO-MEDROL ) injection 40 mg      Plan: No additional findings.   Meds & Orders:  Meds ordered this encounter  Medications   methylPREDNISolone  acetate (DEPO-MEDROL ) injection 40 mg    Orders Placed This Encounter  Procedures   XR C-ARM NO REPORT   Epidural Steroid injection    Follow-up: Return for visit to requesting provider as needed.   Procedures: No procedures performed  Cervical Epidural Steroid Injection - Interlaminar Approach with Fluoroscopic Guidance  Patient: Adam Vaughn      Date of Birth: [birth year] MRN: [id number] PCP: Garald Karlynn GAILS, MD      Visit Date: 05/18/2024   Universal Protocol:    Date/Time: 11/17/259:46 AM  Consent Given By: the patient  Position: PRONE  Additional Comments: Vital signs were monitored before and after the procedure. Patient was prepped and draped in the usual sterile fashion. The correct patient, procedure, and site was verified.   Injection Procedure  Details:   Procedure diagnoses: Cervical radiculopathy [M54.12]    Meds Administered:  Meds ordered this encounter  Medications   methylPREDNISolone  acetate (DEPO-MEDROL ) injection 40 mg     Laterality: Right  Location/Site: C7-T1  Needle: 3.5 in., 20 ga. Tuohy  Needle Placement: Paramedian epidural space  Findings:  -Comments: Excellent flow of contrast into the epidural space.  Procedure Details: Using a paramedian approach from the side mentioned above, the region overlying the inferior lamina was localized under fluoroscopic visualization and the soft tissues overlying this structure were infiltrated with 4 ml. of 1% Lidocaine  without Epinephrine . A # 20 gauge, Tuohy needle was inserted into the epidural space using a paramedian approach.  The epidural space was localized using loss of resistance along with contralateral oblique bi-planar fluoroscopic views.  After negative aspirate for air, blood, and CSF, a 2 ml. volume of Isovue -250 was injected into the epidural space and the flow of contrast was observed. Radiographs were obtained for documentation purposes.   The injectate was administered into the level noted above.  Additional Comments:  The patient tolerated the procedure well Dressing: 2 x 2 sterile gauze and Band-Aid    Post-procedure details: Patient was observed during the procedure. Post-procedure instructions were reviewed.  Patient left the clinic in stable condition.   Clinical History: MRI CERVICAL SPINE WITHOUT CONTRAST   TECHNIQUE: Multiplanar, multisequence MR imaging of the cervical spine was performed. No intravenous contrast was administered.   COMPARISON:  Cervical spine radiographs 12/02/2023.   FINDINGS: Alignment: No significant spondylolisthesis.   Vertebrae: Cervical vertebral body  height is maintained. No significant marrow edema or focal worrisome marrow lesion.   Cord: No signal abnormality identified within the cervical  spinal cord. Mild flattening of the ventral spinal cord at C5-C6 as described below.   Posterior Fossa, vertebral arteries, paraspinal tissues: No abnormality identified within included portions of the posterior fossa. Flow voids preserved within visible portions of the cervical vertebral arteries. No paraspinal mass or collection.   Disc levels:   No more than mild disc degeneration within the cervical spine.   C2-C3: No significant disc herniation or stenosis.   C3-C4: Slight disc bulge. Mild bilateral uncovertebral hypertrophy. No significant spinal canal or foraminal stenosis.   C4-C5: Slight disc bulge. No significant spinal canal or foraminal stenosis.   C5-C6: A central disc protrusion results in mild spinal canal stenosis, focally effacing the ventral thecal sac and slightly flattening the ventral aspect of the spinal cord. No significant foraminal stenosis.   C6-C7: No significant disc herniation or stenosis.   C7-T1: This level is imaged in the sagittal plane only. No significant disc herniation or stenosis.   Other: Partially imaged mucous retention cyst within the left maxillary sinus, measuring at least 2.6 cm.   IMPRESSION: 1. Cervical spondylosis as outlined within the body of the report. 2. At C5-C6, a central disc protrusion results in mild spinal canal stenosis, focally effacing the ventral thecal sac and slightly flattening the ventral aspect of the spinal cord. 3. No significant spinal canal stenosis at the remaining levels. 4. No significant foraminal stenosis. 5. No more than mild disc degeneration. 6. Partially imaged left maxillary sinus mucous retention cyst (measuring at least 2.6 cm).     Electronically Signed   By: Rockey Childs D.O.   On: 03/25/2024 19:52 ----- NCV & EMG Findings: (07/19/2022) Electrodiagnostic testing of the right lower extremity and additional studies of the left shows: 1. Bilateral sural and superficial peroneal  sensory responses are within normal limits. 2. Bilateral peroneal and tibial motor responses are within normal limits. 3. Bilateral tibial H reflex studies are within normal limits. 4. Chronic motor axon loss changes are seen affecting the rectus femoris and adductor longus muscles on the right, without accompanying active denervation.  These findings are not present in the left lower extremity.   Impression: 1. 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 2. There is no evidence of a large fiber sensorimotor polyneuropathy affecting the lower extremities.     ___________________________ Tonita Blanch, DO     Objective:  VS:  HT:    WT:   BMI:     BP:(!) 145/90  HR:81bpm  TEMP: ( )  RESP:  Physical Exam Vitals and nursing note reviewed.  Constitutional:      General: He is not in acute distress.    Appearance: Normal appearance. He is not ill-appearing.  HENT:     Head: Normocephalic and atraumatic.     Right Ear: External ear normal.     Left Ear: External ear normal.  Eyes:     Extraocular Movements: Extraocular movements intact.  Cardiovascular:     Rate and Rhythm: Normal rate.     Pulses: Normal pulses.  Abdominal:     General: There is no distension.     Palpations: Abdomen is soft.  Musculoskeletal:        General: No signs of injury.     Cervical back: Neck supple. Tenderness present. No rigidity.     Right lower leg: No edema.     Left lower  leg: No edema.     Comments: Patient has good strength in the upper extremities with 5 out of 5 strength in wrist extension long finger flexion APB.  No intrinsic hand muscle atrophy.  Negative Hoffmann's test.  Lymphadenopathy:     Cervical: No cervical adenopathy.  Skin:    Findings: No erythema or rash.  Neurological:     General: No focal deficit present.     Mental Status: He is alert and oriented to person, place, and time.     Sensory: No sensory deficit.     Motor: No weakness or abnormal muscle tone.      Coordination: Coordination normal.  Psychiatric:        Mood and Affect: Mood normal.        Behavior: Behavior normal.      Imaging: No results found.

## 2024-05-22 ENCOUNTER — Ambulatory Visit: Admitting: Physical Medicine and Rehabilitation

## 2024-05-24 ENCOUNTER — Other Ambulatory Visit: Payer: Self-pay | Admitting: Surgical

## 2024-06-05 ENCOUNTER — Ambulatory Visit: Admitting: Surgical

## 2024-06-05 ENCOUNTER — Encounter: Payer: Self-pay | Admitting: Surgical

## 2024-06-05 DIAGNOSIS — Z9889 Other specified postprocedural states: Secondary | ICD-10-CM

## 2024-06-05 NOTE — Progress Notes (Signed)
 Post-Op Visit Note   Patient: Adam Vaughn           Date of Birth: Sep 27, 1975           MRN: [id number] Visit Date: 06/05/2024 PCP: Garald Karlynn GAILS, MD   Assessment & Plan:  Chief Complaint:  Chief Complaint  Patient presents with   Left Knee - Routine Post Op, Follow-up      Left knee scope 04/27/2024     Visit Diagnoses:  1. S/P left knee arthroscopy     Plan: Patient is a 48 year old male who presents s/p left knee arthroscopy with meniscal debridement on 04/27/2024.  Feels like he is consistently progressing.  Still has some aching pain particularly with stairs but overall he feels a lot better than he did 1 month ago and this recovery is easier for him than his right knee recovery was.  He has 0 degrees extension and 120 degrees of knee flexion on exam today.  No knee effusion.  Incisions are well-healed.  No calf tenderness.  Negative Homans' sign.  Palpable PT pulse.  Excellent quad strength rated 5/5.  Plan at this time is continue with activity as tolerated.  He does not have any sports or significant exercise to return to.  He understands that it take about 4 to 6 months to see the full improvement after surgery.  Follow-up as needed.  Follow-Up Instructions: No follow-ups on file.   Orders:  No orders of the defined types were placed in this encounter.  No orders of the defined types were placed in this encounter.   Imaging: No results found.  PMFS History: Patient Active Problem List   Diagnosis Date Noted   Thumb laceration, left, initial encounter 04/09/2024   Arthralgia 12/12/2023   Contact dermatitis 06/17/2023   Hematuria 01/08/2023   History of kidney stones 01/08/2023   Hypokalemia 12/10/2022   Foot pain, bilateral 11/12/2022   Hypogonadism male 09/19/2022   Chronic eczematous otitis externa of both ears 05/16/2022   Hypertrophy of inferior nasal turbinate 05/16/2022   Situational depression 05/14/2022   Coronary atherosclerosis  03/26/2022   Dyslipidemia 01/11/2022   Chronic pain syndrome 11/07/2021   Piriformis syndrome 11/07/2021   Bladder neck obstruction 09/04/2021   Asthmatic bronchitis 09/04/2021   Pain in right shoulder 06/29/2021   Meteorism 06/01/2021   Sinus congestion 02/28/2021   Vertigo 02/17/2021   Headache 02/17/2021   Fever 02/17/2021   Falls 02/17/2021   Allergic rhinitis 10/25/2020   Family history of acute polio 10/25/2020   Chest pain, atypical 10/10/2020   S/P laparoscopic cholecystectomy 10/10/2020   Hyperglycemia 10/10/2020   Acute cholecystitis 09/30/2020   Insomnia 09/26/2020   Knee pain 06/23/2020   Low vitamin B12 level 05/31/2020   Ankle sprain 05/31/2020   Constipation 05/31/2020   Degenerative spondylolisthesis 04/11/2020   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   DDD (degenerative disc disease), lumbar 01/20/2020   Left lumbar radiculitis 01/20/2020   Vitamin D  deficiency 01/15/2020   Low back pain 01/12/2020   IBS (irritable bowel syndrome) 01/12/2020   Nephrolithiasis 01/12/2020   Erectile dysfunction 01/12/2020   GERD (gastroesophageal reflux disease) 01/12/2020   Past Medical History:  Diagnosis Date   Arthritis    Chronic back pain    Family history of colon cancer    sister   GERD (gastroesophageal reflux disease)    History of kidney stones    Hyperlipidemia    Hypertension     Family  History  Problem Relation Age of Onset   Heart disease Father 29       MI   Hypertension Father    Heart attack Father    Other Father        Small intestine removed but does not know why   Diabetes Sister    Heart disease Sister 37       MI   Colon cancer Sister    Colon polyps Sister    Colon polyps Sister    Healthy Sister    Healthy Sister    Healthy Sister    Other Brother        MVA   Healthy Brother    Healthy Brother    Healthy Brother    Healthy Brother    Healthy Brother    Healthy Brother    Liver disease Neg  Hx    Pancreatic cancer Neg Hx    Esophageal cancer Neg Hx    Stomach cancer Neg Hx     Past Surgical History:  Procedure Laterality Date   BACK SURGERY  04/11/2020   CHOLECYSTECTOMY N/A 10/01/2020   Procedure: LAPAROSCOPIC CHOLECYSTECTOMY;  Surgeon: Curvin Deward MOULD, MD;  Location: [facility];  Service: General;  Laterality: N/A;   COLONOSCOPY  1991   IBS   endocolon  09/15/2021   [facility], GERD   HAND SURGERY Bilateral    SHOULDER SURGERY Right 08/2021   SPINAL CORD STIMULATOR IMPLANT  2022   SPINAL CORD STIMULATOR REMOVAL  2022   TONSILLECTOMY     VASECTOMY     Social History   Occupational History   Not on file  Tobacco Use   Smoking status: Never    Passive exposure: Never   Smokeless tobacco: Never  Vaping Use   Vaping status: Never Used  Substance and Sexual Activity   Alcohol use: Not Currently    Comment: States quit around age 44   Drug use: Never   Sexual activity: Not Currently    Birth control/protection: Surgical    Comment: vastectomy

## 2024-06-09 ENCOUNTER — Ambulatory Visit: Admitting: Physical Medicine and Rehabilitation

## 2024-06-09 ENCOUNTER — Encounter: Payer: Self-pay | Admitting: Internal Medicine

## 2024-06-09 ENCOUNTER — Ambulatory Visit: Admitting: Internal Medicine

## 2024-06-09 VITALS — BP 108/68 | HR 77 | Temp 98.8°F | Ht 68.0 in | Wt 196.4 lb

## 2024-06-09 DIAGNOSIS — F4321 Adjustment disorder with depressed mood: Secondary | ICD-10-CM | POA: Diagnosis not present

## 2024-06-09 DIAGNOSIS — R7989 Other specified abnormal findings of blood chemistry: Secondary | ICD-10-CM | POA: Diagnosis not present

## 2024-06-09 DIAGNOSIS — G8929 Other chronic pain: Secondary | ICD-10-CM

## 2024-06-09 DIAGNOSIS — F5104 Psychophysiologic insomnia: Secondary | ICD-10-CM

## 2024-06-09 DIAGNOSIS — G894 Chronic pain syndrome: Secondary | ICD-10-CM

## 2024-06-09 DIAGNOSIS — R296 Repeated falls: Secondary | ICD-10-CM

## 2024-06-09 DIAGNOSIS — M25562 Pain in left knee: Secondary | ICD-10-CM | POA: Diagnosis not present

## 2024-06-09 MED ORDER — OXYCODONE HCL 10 MG PO TABS: 10.0000 mg | ORAL_TABLET | Freq: Three times a day (TID) | ORAL | 0 refills | Status: AC | PRN

## 2024-06-09 MED ORDER — TRAZODONE HCL 50 MG PO TABS: 50.0000 mg | ORAL_TABLET | Freq: Every evening | ORAL | 5 refills | Status: AC | PRN

## 2024-06-09 MED ORDER — ONDANSETRON HCL 4 MG PO TABS: 4.0000 mg | ORAL_TABLET | Freq: Three times a day (TID) | ORAL | 3 refills | Status: AC | PRN

## 2024-06-09 NOTE — Progress Notes (Signed)
 Subjective:  Patient ID: Adam Vaughn, male    DOB: [birth year]  Age: 48 y.o. MRN: [id number]  CC: Medical Management of Chronic Issues (2 Month follow up)   HPI Adam Vaughn presents for chronic pain C/o insomnia  Outpatient Medications Prior to Visit  Medication Sig Dispense Refill   albuterol  (PROAIR  HFA) 108 (90 Base) MCG/ACT inhaler Inhale 2 puffs into the lungs every 6 (six) hours as needed for wheezing or shortness of breath. 1 each 6   ASPIRIN  LOW DOSE 81 MG chewable tablet CHEW 1 TABLET BY MOUTH 2 TIMES A DAY TO PREVENT BLOOD CLOTS 60 tablet 0   Docusate Calcium  (STOOL SOFTENER PO) Take 1 tablet by mouth daily.     esomeprazole  (NEXIUM ) 40 MG capsule Take 1 capsule (40 mg total) by mouth 2 (two) times daily before a meal. 60 capsule 11   ezetimibe  (ZETIA ) 10 MG tablet Take 1 tablet by mouth once daily 90 tablet 2   fluticasone  (FLONASE ) 50 MCG/ACT nasal spray Place 2 sprays into both nostrils daily. 16 g 6   hyoscyamine  (LEVSIN  SL) 0.125 MG SL tablet Take 1 tablet (0.125 mg total) by mouth every 6 (six) hours as needed. 30 tablet 2   methylcellulose (CITRUCEL) oral powder Take 1 packet by mouth daily.     NEEDLE, DISP, 23 G (BD DISP NEEDLE) 23G X 1 MISC Use for intramuscular injections. 50 each 3   NEEDLE, DISP, 25 G (B-D DISP NEEDLE 25GX1) 25G X 1 MISC Use every two weeks to inject Testosterone  medication. 50 each 3   predniSONE  (DELTASONE ) 10 MG tablet Prednisone  10 mg: take 4 tabs a day x 3 days; then 3 tabs a day x 4 days; then 2 tabs a day x 4 days, then 1 tab a day x 6 days, then stop. Take pc. 38 tablet 1   pregabalin  (LYRICA ) 150 MG capsule Take 1 capsule (150 mg total) by mouth 3 (three) times daily. 90 capsule 3   rosuvastatin  (CRESTOR ) 40 MG tablet Take 1 tablet by mouth once daily 90 tablet 1   tadalafil  (CIALIS ) 5 MG tablet Take 5 mg by mouth daily.     tamsulosin  (FLOMAX ) 0.4 MG CAPS capsule Take 1 capsule (0.4 mg total) by mouth daily. 90 capsule 3    testosterone  cypionate (DEPOTESTOSTERONE CYPIONATE) 200 MG/ML injection INJECT 1 ML (CC) INTRAMUSCULARLY EVERY 10 DAYS 9 mL 0   tiZANidine  (ZANAFLEX ) 4 MG tablet Take 1 tablet (4 mg total) by mouth 3 (three) times daily. 180 tablet 1   triamcinolone  cream (KENALOG ) 0.5 % Apply 1 Application topically 4 (four) times daily. 45 g 1   Vitamin D -Vitamin K (K2 PLUS D3) [id number] MCG-UNIT TABS 1 po daily 100 tablet 3   ondansetron  (ZOFRAN ) 4 MG tablet Take 1 tablet (4 mg total) by mouth every 8 (eight) hours as needed. 30 tablet 3   Oxycodone  HCl 10 MG TABS Take 1 tablet (10 mg total) by mouth every 6 (six) hours as needed. 90 tablet 0   Oxycodone  HCl 10 MG TABS Take 1 tablet (10 mg total) by mouth every 4 (four) hours as needed. For postop pain. Do not take with your chronic oxycodone  prescription 30 tablet 0   No facility-administered medications prior to visit.    ROS: Review of Systems  Constitutional:  Positive for fatigue. Negative for appetite change and unexpected weight change.  HENT:  Negative for congestion, nosebleeds, sneezing, sore throat and trouble swallowing.   Eyes:  Negative for itching and visual disturbance.  Respiratory:  Negative for cough.   Cardiovascular:  Negative for chest pain, palpitations and leg swelling.  Gastrointestinal:  Negative for abdominal distention, blood in stool, diarrhea and nausea.  Genitourinary:  Negative for frequency and hematuria.  Musculoskeletal:  Positive for back pain and gait problem. Negative for joint swelling and neck pain.  Skin:  Negative for rash.  Neurological:  Positive for weakness. Negative for dizziness, tremors and speech difficulty.  Psychiatric/Behavioral:  Positive for dysphoric mood. Negative for agitation, decreased concentration, sleep disturbance and suicidal ideas. The patient is not nervous/anxious.     Objective:  BP 108/68   Pulse 77   Temp 98.8 F (37.1 C)   Ht 5' 8 (1.727 m)   Wt 196 lb 6.4 oz (89.1 kg)   SpO2  98%   BMI 29.86 kg/m   BP Readings from Last 3 Encounters:  06/09/24 108/68  05/18/24 (!) 145/90  04/09/24 130/82    Wt Readings from Last 3 Encounters:  06/09/24 196 lb 6.4 oz (89.1 kg)  04/09/24 197 lb 6.4 oz (89.5 kg)  02/11/24 198 lb (89.8 kg)    Physical Exam Constitutional:      General: He is not in acute distress.    Appearance: Normal appearance. He is well-developed.     Comments: NAD  Eyes:     Conjunctiva/sclera: Conjunctivae normal.     Pupils: Pupils are equal, round, and reactive to light.  Neck:     Thyroid : No thyromegaly.     Vascular: No JVD.  Cardiovascular:     Rate and Rhythm: Normal rate and regular rhythm.     Heart sounds: Normal heart sounds. No murmur heard.    No friction rub. No gallop.  Pulmonary:     Effort: Pulmonary effort is normal. No respiratory distress.     Breath sounds: Normal breath sounds. No wheezing or rales.  Chest:     Chest wall: No tenderness.  Abdominal:     General: Bowel sounds are normal. There is no distension.     Palpations: Abdomen is soft. There is no mass.     Tenderness: There is no abdominal tenderness. There is no guarding or rebound.  Musculoskeletal:        General: Tenderness present. Normal range of motion.     Cervical back: Normal range of motion.     Right lower leg: No edema.     Left lower leg: No edema.  Lymphadenopathy:     Cervical: No cervical adenopathy.  Skin:    General: Skin is warm and dry.     Findings: No rash.  Neurological:     Mental Status: He is alert and oriented to person, place, and time.     Cranial Nerves: No cranial nerve deficit.     Motor: No abnormal muscle tone.     Coordination: Coordination normal.     Gait: Gait abnormal.     Deep Tendon Reflexes: Reflexes are normal and symmetric.  Psychiatric:        Behavior: Behavior normal.        Thought Content: Thought content normal.        Judgment: Judgment normal.   LS - pain w/ROM Antalgic gait  Lab Results   Component Value Date   WBC 5.7 04/09/2024   HGB 16.1 04/09/2024   HCT 47.9 04/09/2024   PLT 201.0 04/09/2024   GLUCOSE 80 04/09/2024   CHOL 110 04/09/2024   TRIG  131.0 04/09/2024   HDL 52.10 04/09/2024   LDLCALC 32 04/09/2024   ALT 49 04/09/2024   AST 24 04/09/2024   NA 140 04/09/2024   K 3.7 04/09/2024   CL 106 04/09/2024   CREATININE 0.86 04/09/2024   BUN 12 04/09/2024   CO2 26 04/09/2024   TSH 1.54 04/09/2024   PSA 1.83 09/04/2021   HGBA1C 5.5 09/04/2021    MR Cervical Spine w/o contrast Result Date: 03/25/2024 CLINICAL DATA:  Provided history: Cervicalgia. Neck pain, chronic. Additional history provided by the scanning technologist: The patient reports neck pain rating down right arm for 3 months. EXAM: MRI CERVICAL SPINE WITHOUT CONTRAST TECHNIQUE: Multiplanar, multisequence MR imaging of the cervical spine was performed. No intravenous contrast was administered. COMPARISON:  Cervical spine radiographs 12/02/2023. FINDINGS: Alignment: No significant spondylolisthesis. Vertebrae: Cervical vertebral body height is maintained. No significant marrow edema or focal worrisome marrow lesion. Cord: No signal abnormality identified within the cervical spinal cord. Mild flattening of the ventral spinal cord at C5-C6 as described below. Posterior Fossa, vertebral arteries, paraspinal tissues: No abnormality identified within included portions of the posterior fossa. Flow voids preserved within visible portions of the cervical vertebral arteries. No paraspinal mass or collection. Disc levels: No more than mild disc degeneration within the cervical spine. C2-C3: No significant disc herniation or stenosis. C3-C4: Slight disc bulge. Mild bilateral uncovertebral hypertrophy. No significant spinal canal or foraminal stenosis. C4-C5: Slight disc bulge. No significant spinal canal or foraminal stenosis. C5-C6: A central disc protrusion results in mild spinal canal stenosis, focally effacing the ventral  thecal sac and slightly flattening the ventral aspect of the spinal cord. No significant foraminal stenosis. C6-C7: No significant disc herniation or stenosis. C7-T1: This level is imaged in the sagittal plane only. No significant disc herniation or stenosis. Other: Partially imaged mucous retention cyst within the left maxillary sinus, measuring at least 2.6 cm. IMPRESSION: 1. Cervical spondylosis as outlined within the body of the report. 2. At C5-C6, a central disc protrusion results in mild spinal canal stenosis, focally effacing the ventral thecal sac and slightly flattening the ventral aspect of the spinal cord. 3. No significant spinal canal stenosis at the remaining levels. 4. No significant foraminal stenosis. 5. No more than mild disc degeneration. 6. Partially imaged left maxillary sinus mucous retention cyst (measuring at least 2.6 cm). Electronically Signed   By: Rockey Childs D.O.   On: 03/25/2024 19:52    Assessment & Plan:   Problem List Items Addressed This Visit     Chronic pain syndrome - Primary   Chronic severe LBP - it is too expensive for the patient to continue to go to pain clinic.  I will have to take over his pain prescriptions.  He had a UDS screen a couple months ago.  Was signed a pain contract.  Currently on oxycodone  10 mg 3 times daily. Blue-Emu cream -- was recommended to use 2-3 times a day Pt had a low back injection L4-5, S1      Relevant Medications   traZODone  (DESYREL ) 50 MG tablet   Oxycodone  HCl 10 MG TABS   Oxycodone  HCl 10 MG TABS   Falls   Better some      Insomnia disorder   Worse. Will try Trazodone  50-100 at hs      Knee pain   Better w/surgery      Low vitamin B12 level   On B complex      Situational depression   Added Trazodone   50-100 at hs      Relevant Medications   traZODone  (DESYREL ) 50 MG tablet      Meds ordered this encounter  Medications   traZODone  (DESYREL ) 50 MG tablet    Sig: Take 1-2 tablets (50-100 mg total)  by mouth at bedtime as needed for sleep.    Dispense:  60 tablet    Refill:  5   ondansetron  (ZOFRAN ) 4 MG tablet    Sig: Take 1 tablet (4 mg total) by mouth every 8 (eight) hours as needed.    Dispense:  30 tablet    Refill:  3   Oxycodone  HCl 10 MG TABS    Sig: Take 1 tablet (10 mg total) by mouth every 8 (eight) hours as needed.    Dispense:  90 tablet    Refill:  0    Please fill on or after 06/10/2024 Code: M54.50   Oxycodone  HCl 10 MG TABS    Sig: Take 1 tablet (10 mg total) by mouth every 8 (eight) hours as needed. For postop pain. Do not take with your chronic oxycodone  prescription    Dispense:  90 tablet    Refill:  0    Please fill on or after 07/10/2024 Code: M54.50      Follow-up: Return in about 2 months (around 08/10/2024) for a follow-up visit.  Marolyn Noel, MD

## 2024-06-09 NOTE — Assessment & Plan Note (Addendum)
 Worse. Will try Trazodone  50-100 at hs

## 2024-06-09 NOTE — Assessment & Plan Note (Signed)
 Better w/surgery

## 2024-06-09 NOTE — Assessment & Plan Note (Signed)
Better some

## 2024-06-09 NOTE — Assessment & Plan Note (Signed)
 On B complex

## 2024-06-09 NOTE — Assessment & Plan Note (Signed)
 Added Trazodone  50-100 at hs

## 2024-06-09 NOTE — Assessment & Plan Note (Signed)
 Chronic severe LBP - it is too expensive for the patient to continue to go to pain clinic.  I will have to take over his pain prescriptions.  He had a UDS screen a couple months ago.  Was signed a pain contract.  Currently on oxycodone  10 mg 3 times daily. Blue-Emu cream -- was recommended to use 2-3 times a day Pt had a low back injection L4-5, S1

## 2024-06-26 ENCOUNTER — Encounter: Payer: Self-pay | Admitting: Internal Medicine

## 2024-06-26 ENCOUNTER — Ambulatory Visit: Admitting: Internal Medicine

## 2024-06-26 VITALS — BP 108/64 | HR 93 | Temp 99.0°F | Ht 68.0 in | Wt 196.4 lb

## 2024-06-26 DIAGNOSIS — J4520 Mild intermittent asthma, uncomplicated: Secondary | ICD-10-CM | POA: Diagnosis not present

## 2024-06-26 DIAGNOSIS — J069 Acute upper respiratory infection, unspecified: Secondary | ICD-10-CM | POA: Diagnosis not present

## 2024-06-26 DIAGNOSIS — M2559 Pain in other specified joint: Secondary | ICD-10-CM

## 2024-06-26 MED ORDER — METHYLPREDNISOLONE 4 MG PO TBPK: ORAL_TABLET | ORAL | 0 refills | Status: AC

## 2024-06-26 MED ORDER — PROMETHAZINE-DM 6.25-15 MG/5ML PO SYRP: 5.0000 mL | ORAL_SOLUTION | Freq: Four times a day (QID) | ORAL | 0 refills | Status: AC | PRN

## 2024-06-26 MED ORDER — ALBUTEROL SULFATE HFA 108 (90 BASE) MCG/ACT IN AERS: 2.0000 | INHALATION_SPRAY | Freq: Four times a day (QID) | RESPIRATORY_TRACT | 6 refills | Status: AC | PRN

## 2024-06-26 MED ORDER — AZITHROMYCIN 250 MG PO TABS: ORAL_TABLET | ORAL | 0 refills | Status: AC

## 2024-06-26 NOTE — Assessment & Plan Note (Signed)
 Z pack Prom-cod syr

## 2024-06-26 NOTE — Assessment & Plan Note (Signed)
 Worse due to URI Medrol  pack

## 2024-06-26 NOTE — Assessment & Plan Note (Addendum)
 Proair  MDI prn Medrol  pack

## 2024-06-26 NOTE — Progress Notes (Signed)
 "  Subjective:  Patient ID: Adam Vaughn, male    DOB: Mar 24, 1976  Age: 48 y.o. MRN: [id number]  CC: Sinusitis (Sinus pressure congestion, chest congestion, body aches, sweating, productive cough (yellow phlegm), ear pain. Symptoms for 2-3 days. Has not been around anyone with similar symptoms they are aware of. Received flu shot last week)   HPI Adam Vaughn presents for cough - yellow sputum x 2-3 days C/o R ear pain  Outpatient Medications Prior to Visit  Medication Sig Dispense Refill   ASPIRIN  LOW DOSE 81 MG chewable tablet CHEW 1 TABLET BY MOUTH 2 TIMES A DAY TO PREVENT BLOOD CLOTS 60 tablet 0   Docusate Calcium  (STOOL SOFTENER PO) Take 1 tablet by mouth daily.     esomeprazole  (NEXIUM ) 40 MG capsule Take 1 capsule (40 mg total) by mouth 2 (two) times daily before a meal. 60 capsule 11   ezetimibe  (ZETIA ) 10 MG tablet Take 1 tablet by mouth once daily 90 tablet 2   fluticasone  (FLONASE ) 50 MCG/ACT nasal spray Place 2 sprays into both nostrils daily. 16 g 6   hyoscyamine  (LEVSIN  SL) 0.125 MG SL tablet Take 1 tablet (0.125 mg total) by mouth every 6 (six) hours as needed. 30 tablet 2   methylcellulose (CITRUCEL) oral powder Take 1 packet by mouth daily.     NEEDLE, DISP, 23 G (BD DISP NEEDLE) 23G X 1 MISC Use for intramuscular injections. 50 each 3   NEEDLE, DISP, 25 G (B-D DISP NEEDLE 25GX1) 25G X 1 MISC Use every two weeks to inject Testosterone  medication. 50 each 3   ondansetron  (ZOFRAN ) 4 MG tablet Take 1 tablet (4 mg total) by mouth every 8 (eight) hours as needed. 30 tablet 3   Oxycodone  HCl 10 MG TABS Take 1 tablet (10 mg total) by mouth every 8 (eight) hours as needed. 90 tablet 0   Oxycodone  HCl 10 MG TABS Take 1 tablet (10 mg total) by mouth every 8 (eight) hours as needed. For postop pain. Do not take with your chronic oxycodone  prescription 90 tablet 0   predniSONE  (DELTASONE ) 10 MG tablet Prednisone  10 mg: take 4 tabs a day x 3 days; then 3 tabs a day x 4 days;  then 2 tabs a day x 4 days, then 1 tab a day x 6 days, then stop. Take pc. 38 tablet 1   pregabalin  (LYRICA ) 150 MG capsule Take 1 capsule (150 mg total) by mouth 3 (three) times daily. 90 capsule 3   rosuvastatin  (CRESTOR ) 40 MG tablet Take 1 tablet by mouth once daily 90 tablet 1   tadalafil  (CIALIS ) 5 MG tablet Take 5 mg by mouth daily.     tamsulosin  (FLOMAX ) 0.4 MG CAPS capsule Take 1 capsule (0.4 mg total) by mouth daily. 90 capsule 3   testosterone  cypionate (DEPOTESTOSTERONE CYPIONATE) 200 MG/ML injection INJECT 1 ML (CC) INTRAMUSCULARLY EVERY 10 DAYS 9 mL 0   tiZANidine  (ZANAFLEX ) 4 MG tablet Take 1 tablet (4 mg total) by mouth 3 (three) times daily. 180 tablet 1   traZODone  (DESYREL ) 50 MG tablet Take 1-2 tablets (50-100 mg total) by mouth at bedtime as needed for sleep. 60 tablet 5   triamcinolone  cream (KENALOG ) 0.5 % Apply 1 Application topically 4 (four) times daily. 45 g 1   Vitamin D -Vitamin K (K2 PLUS D3) [phone] MCG-UNIT TABS 1 po daily 100 tablet 3   albuterol  (PROAIR  HFA) 108 (90 Base) MCG/ACT inhaler Inhale 2 puffs into the lungs every 6 (  six) hours as needed for wheezing or shortness of breath. 1 each 6   No facility-administered medications prior to visit.    ROS: Review of Systems  Constitutional:  Positive for chills. Negative for appetite change, fatigue and unexpected weight change.  HENT:  Positive for congestion, postnasal drip, rhinorrhea, sinus pain and sore throat. Negative for nosebleeds, sneezing, trouble swallowing and voice change.   Eyes:  Negative for itching and visual disturbance.  Respiratory:  Positive for cough. Negative for wheezing.   Cardiovascular:  Negative for chest pain, palpitations and leg swelling.  Gastrointestinal:  Negative for abdominal distention, blood in stool, diarrhea and nausea.  Genitourinary:  Negative for frequency and hematuria.  Musculoskeletal:  Positive for arthralgias, back pain and gait problem. Negative for joint  swelling and neck pain.  Skin:  Negative for rash.  Neurological:  Negative for dizziness, tremors, speech difficulty and weakness.  Psychiatric/Behavioral:  Negative for agitation, dysphoric mood and sleep disturbance. The patient is not nervous/anxious.     Objective:  BP 108/64   Pulse 93   Temp 99 F (37.2 C)   Ht 5' 8 (1.727 m)   Wt 196 lb 6.4 oz (89.1 kg)   SpO2 98%   BMI 29.86 kg/m   BP Readings from Last 3 Encounters:  06/26/24 108/64  06/09/24 108/68  05/18/24 (!) 145/90    Wt Readings from Last 3 Encounters:  06/26/24 196 lb 6.4 oz (89.1 kg)  06/09/24 196 lb 6.4 oz (89.1 kg)  04/09/24 197 lb 6.4 oz (89.5 kg)    Physical Exam Constitutional:      General: He is not in acute distress.    Appearance: He is well-developed. He is ill-appearing. He is not toxic-appearing.     Comments: NAD  HENT:     Right Ear: There is no impacted cerumen.     Left Ear: There is no impacted cerumen.     Nose: Congestion and rhinorrhea present.     Mouth/Throat:     Pharynx: Posterior oropharyngeal erythema present. No oropharyngeal exudate.  Eyes:     Conjunctiva/sclera: Conjunctivae normal.     Pupils: Pupils are equal, round, and reactive to light.  Neck:     Thyroid : No thyromegaly.     Vascular: No JVD.  Cardiovascular:     Rate and Rhythm: Normal rate and regular rhythm.     Heart sounds: Normal heart sounds. No murmur heard.    No friction rub. No gallop.  Pulmonary:     Effort: Pulmonary effort is normal. No respiratory distress.     Breath sounds: Normal breath sounds. No wheezing or rales.  Chest:     Chest wall: No tenderness.  Abdominal:     General: Bowel sounds are normal. There is no distension.     Palpations: Abdomen is soft. There is no mass.     Tenderness: There is no abdominal tenderness. There is no guarding or rebound.  Musculoskeletal:        General: Tenderness present. Normal range of motion.     Cervical back: Normal range of motion.   Lymphadenopathy:     Cervical: No cervical adenopathy.  Skin:    General: Skin is warm and dry.     Findings: No rash.  Neurological:     Mental Status: He is alert and oriented to person, place, and time. Mental status is at baseline.     Cranial Nerves: No cranial nerve deficit.     Motor: No abnormal  muscle tone.     Coordination: Coordination normal.     Gait: Gait normal.     Deep Tendon Reflexes: Reflexes are normal and symmetric.  Psychiatric:        Behavior: Behavior normal.        Thought Content: Thought content normal.        Judgment: Judgment normal.   Eryth throat, B TMs  Lab Results  Component Value Date   WBC 5.7 04/09/2024   HGB 16.1 04/09/2024   HCT 47.9 04/09/2024   PLT 201.0 04/09/2024   GLUCOSE 80 04/09/2024   CHOL 110 04/09/2024   TRIG 131.0 04/09/2024   HDL 52.10 04/09/2024   LDLCALC 32 04/09/2024   ALT 49 04/09/2024   AST 24 04/09/2024   NA 140 04/09/2024   K 3.7 04/09/2024   CL 106 04/09/2024   CREATININE 0.86 04/09/2024   BUN 12 04/09/2024   CO2 26 04/09/2024   TSH 1.54 04/09/2024   PSA 1.83 09/04/2021   HGBA1C 5.5 09/04/2021    MR Cervical Spine w/o contrast Result Date: 03/25/2024 CLINICAL DATA:  Provided history: Cervicalgia. Neck pain, chronic. Additional history provided by the scanning technologist: The patient reports neck pain rating down right arm for 3 months. EXAM: MRI CERVICAL SPINE WITHOUT CONTRAST TECHNIQUE: Multiplanar, multisequence MR imaging of the cervical spine was performed. No intravenous contrast was administered. COMPARISON:  Cervical spine radiographs 12/02/2023. FINDINGS: Alignment: No significant spondylolisthesis. Vertebrae: Cervical vertebral body height is maintained. No significant marrow edema or focal worrisome marrow lesion. Cord: No signal abnormality identified within the cervical spinal cord. Mild flattening of the ventral spinal cord at C5-C6 as described below. Posterior Fossa, vertebral arteries,  paraspinal tissues: No abnormality identified within included portions of the posterior fossa. Flow voids preserved within visible portions of the cervical vertebral arteries. No paraspinal mass or collection. Disc levels: No more than mild disc degeneration within the cervical spine. C2-C3: No significant disc herniation or stenosis. C3-C4: Slight disc bulge. Mild bilateral uncovertebral hypertrophy. No significant spinal canal or foraminal stenosis. C4-C5: Slight disc bulge. No significant spinal canal or foraminal stenosis. C5-C6: A central disc protrusion results in mild spinal canal stenosis, focally effacing the ventral thecal sac and slightly flattening the ventral aspect of the spinal cord. No significant foraminal stenosis. C6-C7: No significant disc herniation or stenosis. C7-T1: This level is imaged in the sagittal plane only. No significant disc herniation or stenosis. Other: Partially imaged mucous retention cyst within the left maxillary sinus, measuring at least 2.6 cm. IMPRESSION: 1. Cervical spondylosis as outlined within the body of the report. 2. At C5-C6, a central disc protrusion results in mild spinal canal stenosis, focally effacing the ventral thecal sac and slightly flattening the ventral aspect of the spinal cord. 3. No significant spinal canal stenosis at the remaining levels. 4. No significant foraminal stenosis. 5. No more than mild disc degeneration. 6. Partially imaged left maxillary sinus mucous retention cyst (measuring at least 2.6 cm). Electronically Signed   By: Rockey Childs D.O.   On: 03/25/2024 19:52    Assessment & Plan:   Problem List Items Addressed This Visit     Arthralgia   Worse due to URI Medrol  pack      Asthmatic bronchitis   Proair  MDI prn Medrol  pack      Relevant Medications   methylPREDNISolone  (MEDROL  DOSEPAK) 4 MG TBPK tablet   albuterol  (PROAIR  HFA) 108 (90 Base) MCG/ACT inhaler   Upper respiratory infection - Primary  Z pack Prom-cod  syr      Relevant Medications   azithromycin  (ZITHROMAX  Z-PAK) 250 MG tablet      Meds ordered this encounter  Medications   methylPREDNISolone  (MEDROL  DOSEPAK) 4 MG TBPK tablet    Sig: As directed    Dispense:  21 tablet    Refill:  0   promethazine -dextromethorphan (PROMETHAZINE -DM) 6.25-15 MG/5ML syrup    Sig: Take 5 mLs by mouth 4 (four) times daily as needed for cough.    Dispense:  240 mL    Refill:  0   azithromycin  (ZITHROMAX  Z-PAK) 250 MG tablet    Sig: As directed    Dispense:  6 tablet    Refill:  0   albuterol  (PROAIR  HFA) 108 (90 Base) MCG/ACT inhaler    Sig: Inhale 2 puffs into the lungs every 6 (six) hours as needed for wheezing or shortness of breath.    Dispense:  1 each    Refill:  6      Follow-up: Return for a follow-up visit.  Marolyn Noel, MD "

## 2024-06-27 ENCOUNTER — Other Ambulatory Visit: Payer: Self-pay | Admitting: Surgical

## 2024-06-29 ENCOUNTER — Ambulatory Visit: Admitting: Physical Medicine and Rehabilitation

## 2024-07-06 ENCOUNTER — Encounter: Payer: Self-pay | Admitting: Physical Medicine and Rehabilitation

## 2024-07-06 ENCOUNTER — Ambulatory Visit: Admitting: Physical Medicine and Rehabilitation

## 2024-07-06 DIAGNOSIS — M5441 Lumbago with sciatica, right side: Secondary | ICD-10-CM | POA: Diagnosis not present

## 2024-07-06 DIAGNOSIS — M961 Postlaminectomy syndrome, not elsewhere classified: Secondary | ICD-10-CM

## 2024-07-06 DIAGNOSIS — M5442 Lumbago with sciatica, left side: Secondary | ICD-10-CM | POA: Diagnosis not present

## 2024-07-06 DIAGNOSIS — M5416 Radiculopathy, lumbar region: Secondary | ICD-10-CM | POA: Diagnosis not present

## 2024-07-06 DIAGNOSIS — G894 Chronic pain syndrome: Secondary | ICD-10-CM

## 2024-07-06 DIAGNOSIS — G8929 Other chronic pain: Secondary | ICD-10-CM

## 2024-07-06 NOTE — Progress Notes (Signed)
 Pain Scale   Average Pain 6 Patient advising he has lower back pain radiating to bilateral legs, patient advising the las injection helped about 1 month        +Driver, -BT, -Dye Allergies.

## 2024-07-06 NOTE — Progress Notes (Signed)
 "  Adam Vaughn - 49 y.o. male MRN [id number]  Date of birth: June 26, 1976  Office Visit Note: Visit Date: 07/06/2024 PCP: Garald Karlynn GAILS, MD Referred by: Garald Karlynn GAILS, MD  Subjective: Chief Complaint  Patient presents with   Lower Back - Pain   HPI: Adam Vaughn is a 49 y.o. male who comes in today for evaluation of chronic, worsening and severe bilateral lower back pain radiating to hips and anterior thighs, there is some referral down legs intermittently. Patient is well known to us . He is also patient of Dr. Ozell Ada. Pain ongoing for several years. His pain is fairly constant, no specific aggravating factors. He describes pain as burning and sore sensation, currently rates as 6 out of 10. Some relief of pain with home exercise regimen, rest and use of medications. He does take Lyrica  and Tizanidine  daily. He is currently taking 10 mg Oxycodone  (IR) that is prescribed by his primary care provider Dr. Karlynn Garald. He is prescribed 90 tablets monthly. History of formal physical therapy with minimal relief of pain.   Lumbar MRI imaging from shows posterior lumbar interbody fusion from L4 through S1 without foraminal or central canal stenosis. At L3-4 there is a broad-based disc bulge flattening ventral thecal sac. Mild bilateral facet arthropathy. Mild bilateral foraminal stenosis. Mild spinal stenosis. Bilateral lateral /recess stenosis. He underwent bilateral transforaminal epidural steroid injection in our office on 01/02/2024. He reports 50% relief of pain for several months, roughly 2.5 months. Of note, he does have history of spinal cord stimulator placement in 2022 by Dr. Deatrice Manus. Less than 50% relief of pain with this procedure, he did not proceed with permanent implant.   Dr. Ada previously addressed broken (S1) screw on radiographs and did recommend further evaluation with CT scan prior to any surgical intervention. He has not followed up with him  since March of 2025.   No recent trauma or falls. No bowel/bladder incontinence.       Review of Systems  Musculoskeletal:  Positive for back pain.  Neurological:  Positive for weakness. Negative for tingling and sensory change.  All other systems reviewed and are negative.  Otherwise per HPI.  Assessment & Plan: Visit Diagnoses:    ICD-10-CM   1. Chronic bilateral low back pain with bilateral sciatica  M54.42 Ambulatory referral to Physical Medicine Rehab   M54.41    G89.29     2. Lumbar radiculopathy  M54.16 Ambulatory referral to Physical Medicine Rehab    3. Post laminectomy syndrome  M96.1 Ambulatory referral to Physical Medicine Rehab    4. Chronic pain syndrome  G89.4 Ambulatory referral to Physical Medicine Rehab       Plan: Findings:  Chronic, worsening and severe bilateral lower back pain radiating to hips and anterior thighs in the setting of chronic pain and prior lumbar fusion. Patient continues to have severe pain despite good conservative therapies such as formal physical therapy, home exercise regimen, rest and use of medications. Patients clinical presentation and exam are consistent with lumbar radiculopathy, more of L3 nerve pattern. There is lateral recess narrowing above fusion at L3-L4. Next step is to perform bilateral L3 transforaminal epidural steroid injection under fluoroscopic guidance. If good relief of pain with injection we can repeat this procedure infrequently as needed. Should his pain persist post injection recommend he follow up with Dr. Ada to discuss further treatments. He can continue chronic pain management with Dr. Garald. He does have mild weakness with EHL bilaterally.  No new red flag symptoms noted upon exam today. We will see him back for injection.     Meds & Orders: No orders of the defined types were placed in this encounter.   Orders Placed This Encounter  Procedures   Ambulatory referral to Physical Medicine Rehab     Follow-up: Return for Bilateral L3 transforaminal epidural steroid injection.   Procedures: No procedures performed      Clinical History: MRI CERVICAL SPINE WITHOUT CONTRAST   TECHNIQUE: Multiplanar, multisequence MR imaging of the cervical spine was performed. No intravenous contrast was administered.   COMPARISON:  Cervical spine radiographs 12/02/2023.   FINDINGS: Alignment: No significant spondylolisthesis.   Vertebrae: Cervical vertebral body height is maintained. No significant marrow edema or focal worrisome marrow lesion.   Cord: No signal abnormality identified within the cervical spinal cord. Mild flattening of the ventral spinal cord at C5-C6 as described below.   Posterior Fossa, vertebral arteries, paraspinal tissues: No abnormality identified within included portions of the posterior fossa. Flow voids preserved within visible portions of the cervical vertebral arteries. No paraspinal mass or collection.   Disc levels:   No more than mild disc degeneration within the cervical spine.   C2-C3: No significant disc herniation or stenosis.   C3-C4: Slight disc bulge. Mild bilateral uncovertebral hypertrophy. No significant spinal canal or foraminal stenosis.   C4-C5: Slight disc bulge. No significant spinal canal or foraminal stenosis.   C5-C6: A central disc protrusion results in mild spinal canal stenosis, focally effacing the ventral thecal sac and slightly flattening the ventral aspect of the spinal cord. No significant foraminal stenosis.   C6-C7: No significant disc herniation or stenosis.   C7-T1: This level is imaged in the sagittal plane only. No significant disc herniation or stenosis.   Other: Partially imaged mucous retention cyst within the left maxillary sinus, measuring at least 2.6 cm.   IMPRESSION: 1. Cervical spondylosis as outlined within the body of the report. 2. At C5-C6, a central disc protrusion results in mild spinal  canal stenosis, focally effacing the ventral thecal sac and slightly flattening the ventral aspect of the spinal cord. 3. No significant spinal canal stenosis at the remaining levels. 4. No significant foraminal stenosis. 5. No more than mild disc degeneration. 6. Partially imaged left maxillary sinus mucous retention cyst (measuring at least 2.6 cm).     Electronically Signed   By: Rockey Childs D.O.   On: 03/25/2024 19:52 ----- Bilateral Upper Extremity NCV with EMG 12/31/2023  EMG & NCV Findings: All nerve conduction studies (as indicated in the following tables) were within normal limits.  All left vs. right side differences were within normal limits.     All examined muscles (as indicated in the following table) showed no evidence of electrical instability.     Impression: Essentially NORMAL electrodiagnostic study of both upper limbs.  There is no significant electrodiagnostic evidence of nerve entrapment, brachial plexopathy, cervical radiculopathy or generalized peripheral neuropathy.     As you know, purely sensory or demyelinating radiculopathies and chemical radiculitis may not be detected with this particular electrodiagnostic study. **This electrodiagnostic study cannot rule out small fiber polyneuropathy and dysesthesias from central pain syndromes such as stroke or central pain sensitization syndromes such as fibromyalgia.  Myotomal referral pain from trigger points is also not excluded.   Recommendations: 1.  Follow-up with referring physician. 2.  Continue current management of symptoms.   ___________________________ Prentice Eldonna BETTERS Board Certified, American Board of Physical Medicine and  Rehabilitation   He reports that he has never smoked. He has never been exposed to tobacco smoke. He has never used smokeless tobacco. No results for input(s): HGBA1C, LABURIC in the last 8760 hours.  Objective:  VS:  HT:    WT:   BMI:     BP:   HR: bpm  TEMP: ( )   RESP:  Physical Exam Vitals and nursing note reviewed.  HENT:     Head: Normocephalic and atraumatic.     Right Ear: External ear normal.     Left Ear: External ear normal.     Nose: Nose normal.     Mouth/Throat:     Mouth: Mucous membranes are moist.  Eyes:     Extraocular Movements: Extraocular movements intact.  Cardiovascular:     Rate and Rhythm: Normal rate.     Pulses: Normal pulses.  Pulmonary:     Effort: Pulmonary effort is normal.  Abdominal:     General: Abdomen is flat. There is no distension.  Musculoskeletal:        General: Tenderness present.     Cervical back: Normal range of motion.     Comments: Patient rises from seated position to standing without difficulty. Good lumbar range of motion. No pain noted with facet loading. 5/5 strength noted with bilateral hip flexion, knee flexion/extension, ankle dorsiflexion and plantarflexion. 4/5 with bilateral EHL. No clonus noted bilaterally. No pain upon palpation of greater trochanters. No pain with internal/external rotation of bilateral hips. Sensation intact bilaterally. Dysesthesias noted to bilateral L3 dermatomes. Negative slump test bilaterally. Ambulates without aid, gait steady.     Skin:    General: Skin is warm and dry.     Capillary Refill: Capillary refill takes less than 2 seconds.  Neurological:     Mental Status: He is alert and oriented to person, place, and time.  Psychiatric:        Mood and Affect: Mood normal.        Behavior: Behavior normal.     Ortho Exam  Imaging: No results found.  Past Medical/Family/Surgical/Social History: Medications & Allergies reviewed per EMR, new medications updated. Patient Active Problem List   Diagnosis Date Noted   Upper respiratory infection 06/26/2024   Thumb laceration, left, initial encounter 04/09/2024   Arthralgia 12/12/2023   Contact dermatitis 06/17/2023   Hematuria 01/08/2023   History of kidney stones 01/08/2023   Hypokalemia 12/10/2022    Foot pain, bilateral 11/12/2022   Hypogonadism male 09/19/2022   Chronic eczematous otitis externa of both ears 05/16/2022   Hypertrophy of inferior nasal turbinate 05/16/2022   Situational depression 05/14/2022   Coronary atherosclerosis 03/26/2022   Dyslipidemia 01/11/2022   Chronic pain syndrome 11/07/2021   Piriformis syndrome 11/07/2021   Bladder neck obstruction 09/04/2021   Asthmatic bronchitis 09/04/2021   Pain in right shoulder 06/29/2021   Meteorism 06/01/2021   Sinus congestion 02/28/2021   Vertigo 02/17/2021   Headache 02/17/2021   Fever 02/17/2021   Falls 02/17/2021   Allergic rhinitis 10/25/2020   Family history of acute polio 10/25/2020   Chest pain, atypical 10/10/2020   S/P laparoscopic cholecystectomy 10/10/2020   Hyperglycemia 10/10/2020   Acute cholecystitis 09/30/2020   Insomnia disorder 09/26/2020   Knee pain 06/23/2020   Low vitamin B12 level 05/31/2020   Ankle sprain 05/31/2020   Constipation 05/31/2020   Degenerative spondylolisthesis 04/11/2020   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   DDD (degenerative disc disease), lumbar 01/20/2020  Left lumbar radiculitis 01/20/2020   Vitamin D  deficiency 01/15/2020   Low back pain 01/12/2020   IBS (irritable bowel syndrome) 01/12/2020   Nephrolithiasis 01/12/2020   Erectile dysfunction 01/12/2020   GERD (gastroesophageal reflux disease) 01/12/2020   Past Medical History:  Diagnosis Date   Arthritis    Chronic back pain    Family history of colon cancer    sister   GERD (gastroesophageal reflux disease)    History of kidney stones    Hyperlipidemia    Hypertension    Family History  Problem Relation Age of Onset   Heart disease Father 35       MI   Hypertension Father    Heart attack Father    Other Father        Small intestine removed but does not know why   Diabetes Sister    Heart disease Sister 75       MI   Heart attack Sister    Heart failure  Sister    Colon cancer Sister    Colon polyps Sister    Colon polyps Sister    Healthy Sister    Healthy Sister    Other Brother        MVA   Healthy Brother    Healthy Brother    Healthy Brother    Healthy Brother    Healthy Brother    Healthy Brother    Liver disease Neg Hx    Pancreatic cancer Neg Hx    Esophageal cancer Neg Hx    Stomach cancer Neg Hx    Past Surgical History:  Procedure Laterality Date   BACK SURGERY  04/11/2020   CHOLECYSTECTOMY N/A 10/01/2020   Procedure: LAPAROSCOPIC CHOLECYSTECTOMY;  Surgeon: Curvin Deward MOULD, MD;  Location: [facility];  Service: General;  Laterality: N/A;   COLONOSCOPY  1991   IBS   endocolon  09/15/2021   [facility], GERD   HAND SURGERY Bilateral    SHOULDER SURGERY Right 08/2021   SPINAL CORD STIMULATOR IMPLANT  2022   SPINAL CORD STIMULATOR REMOVAL  2022   TONSILLECTOMY     VASECTOMY     Social History   Occupational History   Not on file  Tobacco Use   Smoking status: Never    Passive exposure: Never   Smokeless tobacco: Never  Vaping Use   Vaping status: Never Used  Substance and Sexual Activity   Alcohol use: Not Currently    Comment: States quit around age 37   Drug use: Never   Sexual activity: Not Currently    Birth control/protection: Surgical    Comment: vastectomy    "

## 2024-07-09 ENCOUNTER — Encounter: Payer: Self-pay | Admitting: Physical Medicine and Rehabilitation

## 2024-07-10 ENCOUNTER — Telehealth: Payer: Self-pay

## 2024-07-10 NOTE — Telephone Encounter (Signed)
 Pre procedural Valium

## 2024-07-13 ENCOUNTER — Other Ambulatory Visit: Payer: Self-pay | Admitting: Physical Medicine and Rehabilitation

## 2024-07-13 DIAGNOSIS — M5416 Radiculopathy, lumbar region: Secondary | ICD-10-CM

## 2024-07-13 MED ORDER — DIAZEPAM 5 MG PO TABS: ORAL_TABLET | ORAL | 0 refills | Status: AC

## 2024-07-23 ENCOUNTER — Ambulatory Visit: Admitting: Physical Medicine and Rehabilitation

## 2024-07-23 ENCOUNTER — Other Ambulatory Visit: Payer: Self-pay

## 2024-07-23 VITALS — BP 137/87 | HR 97

## 2024-07-23 DIAGNOSIS — M5416 Radiculopathy, lumbar region: Secondary | ICD-10-CM

## 2024-07-23 MED ORDER — METHYLPREDNISOLONE ACETATE 40 MG/ML IJ SUSP
40.0000 mg | Freq: Once | INTRAMUSCULAR | Status: AC
  Administered 2024-07-23: 40 mg

## 2024-07-23 NOTE — Progress Notes (Signed)
 Pain Scale   Average Pain 8 Patient advising he has chronic lower back pain that radiates bilaterally however his right side is causing more pain.        +Driver, -BT, -Dye Allergies.

## 2024-07-27 ENCOUNTER — Other Ambulatory Visit: Payer: Self-pay | Admitting: Surgical

## 2024-07-28 ENCOUNTER — Other Ambulatory Visit: Payer: Self-pay | Admitting: Internal Medicine

## 2024-07-28 NOTE — Progress Notes (Signed)
 "  Adam Vaughn - 49 y.o. male MRN [id number]  Date of birth: [birth year]  Office Visit Note: Visit Date: 07/23/2024 PCP: Garald Karlynn GAILS, MD Referred by: Garald Karlynn GAILS, MD  Subjective: Chief Complaint  Patient presents with   Lower Back - Pain   HPI:  Adam Vaughn is a 49 y.o. male who comes in today at the request of Duwaine Pouch, FNP for planned Bilateral L3-4 Lumbar Transforaminal epidural steroid injection with fluoroscopic guidance.  The patient has failed conservative care including home exercise, medications, time and activity modification.  This injection will be diagnostic and hopefully therapeutic.  Please see requesting physician notes for further details and justification.   ROS Otherwise per HPI.  Assessment & Plan: Visit Diagnoses:    ICD-10-CM   1. Lumbar radiculopathy  M54.16 XR C-ARM NO REPORT    Epidural Steroid injection    methylPREDNISolone  acetate (DEPO-MEDROL ) injection 40 mg      Plan: No additional findings.   Meds & Orders:  Meds ordered this encounter  Medications   methylPREDNISolone  acetate (DEPO-MEDROL ) injection 40 mg    Orders Placed This Encounter  Procedures   XR C-ARM NO REPORT   Epidural Steroid injection    Follow-up: Return for visit to requesting provider as needed.   Procedures: No procedures performed  Lumbosacral Transforaminal Epidural Steroid Injection - Sub-Pedicular Approach with Fluoroscopic Guidance  Patient: Adam Vaughn      Date of Birth: [birth year] MRN: [id number] PCP: Garald Karlynn GAILS, MD      Visit Date: 07/23/2024   Universal Protocol:    Date/Time: 07/23/2024  Consent Given By: the patient  Position: PRONE  Additional Comments: Vital signs were monitored before and after the procedure. Patient was prepped and draped in the usual sterile fashion. The correct patient, procedure, and site was verified.   Injection Procedure Details:   Procedure diagnoses: Lumbar  radiculopathy [M54.16]    Meds Administered:  Meds ordered this encounter  Medications   methylPREDNISolone  acetate (DEPO-MEDROL ) injection 40 mg    Laterality: Bilateral  Location/Site: L3  Needle:5.0 in., 22 ga.  Short bevel or Quincke spinal needle  Needle Placement: Transforaminal  Findings:    -Comments: Excellent flow of contrast along the nerve, nerve root and into the epidural space.  Procedure Details: After squaring off the end-plates to get a true AP view, the C-arm was positioned so that an oblique view of the foramen as noted above was visualized. The target area is just inferior to the nose of the scotty dog or sub pedicular. The soft tissues overlying this structure were infiltrated with 2-3 ml. of 1% Lidocaine  without Epinephrine .  The spinal needle was inserted toward the target using a trajectory view along the fluoroscope beam.  Under AP and lateral visualization, the needle was advanced so it did not puncture dura and was located close the 6 O'Clock position of the pedical in AP tracterory. Biplanar projections were used to confirm position. Aspiration was confirmed to be negative for CSF and/or blood. A 1-2 ml. volume of Isovue -250 was injected and flow of contrast was noted at each level. Radiographs were obtained for documentation purposes.   After attaining the desired flow of contrast documented above, a 0.5 to 1.0 ml test dose of 0.25% Marcaine  was injected into each respective transforaminal space.  The patient was observed for 90 seconds post injection.  After no sensory deficits were reported, and normal lower extremity motor function was noted,   the above  injectate was administered so that equal amounts of the injectate were placed at each foramen (level) into the transforaminal epidural space.   Additional Comments:  The patient tolerated the procedure well Dressing: 2 x 2 sterile gauze and Band-Aid    Post-procedure details: Patient was observed  during the procedure. Post-procedure instructions were reviewed.  Patient left the clinic in stable condition.    Clinical History: MRI CERVICAL SPINE WITHOUT CONTRAST   TECHNIQUE: Multiplanar, multisequence MR imaging of the cervical spine was performed. No intravenous contrast was administered.   COMPARISON:  Cervical spine radiographs 12/02/2023.   FINDINGS: Alignment: No significant spondylolisthesis.   Vertebrae: Cervical vertebral body height is maintained. No significant marrow edema or focal worrisome marrow lesion.   Cord: No signal abnormality identified within the cervical spinal cord. Mild flattening of the ventral spinal cord at C5-C6 as described below.   Posterior Fossa, vertebral arteries, paraspinal tissues: No abnormality identified within included portions of the posterior fossa. Flow voids preserved within visible portions of the cervical vertebral arteries. No paraspinal mass or collection.   Disc levels:   No more than mild disc degeneration within the cervical spine.   C2-C3: No significant disc herniation or stenosis.   C3-C4: Slight disc bulge. Mild bilateral uncovertebral hypertrophy. No significant spinal canal or foraminal stenosis.   C4-C5: Slight disc bulge. No significant spinal canal or foraminal stenosis.   C5-C6: A central disc protrusion results in mild spinal canal stenosis, focally effacing the ventral thecal sac and slightly flattening the ventral aspect of the spinal cord. No significant foraminal stenosis.   C6-C7: No significant disc herniation or stenosis.   C7-T1: This level is imaged in the sagittal plane only. No significant disc herniation or stenosis.   Other: Partially imaged mucous retention cyst within the left maxillary sinus, measuring at least 2.6 cm.   IMPRESSION: 1. Cervical spondylosis as outlined within the body of the report. 2. At C5-C6, a central disc protrusion results in mild spinal canal stenosis,  focally effacing the ventral thecal sac and slightly flattening the ventral aspect of the spinal cord. 3. No significant spinal canal stenosis at the remaining levels. 4. No significant foraminal stenosis. 5. No more than mild disc degeneration. 6. Partially imaged left maxillary sinus mucous retention cyst (measuring at least 2.6 cm).     Electronically Signed   By: Rockey Childs D.O.   On: 03/25/2024 19:52 ----- Bilateral Upper Extremity NCV with EMG 12/31/2023  EMG & NCV Findings: All nerve conduction studies (as indicated in the following tables) were within normal limits.  All left vs. right side differences were within normal limits.     All examined muscles (as indicated in the following table) showed no evidence of electrical instability.     Impression: Essentially NORMAL electrodiagnostic study of both upper limbs.  There is no significant electrodiagnostic evidence of nerve entrapment, brachial plexopathy, cervical radiculopathy or generalized peripheral neuropathy.     As you know, purely sensory or demyelinating radiculopathies and chemical radiculitis may not be detected with this particular electrodiagnostic study. **This electrodiagnostic study cannot rule out small fiber polyneuropathy and dysesthesias from central pain syndromes such as stroke or central pain sensitization syndromes such as fibromyalgia.  Myotomal referral pain from trigger points is also not excluded.   Recommendations: 1.  Follow-up with referring physician. 2.  Continue current management of symptoms.   ___________________________ Prentice Eldonna BETTERS Board Certified, American Board of Physical Medicine and Rehabilitation     Objective:  VS:  HT:    WT:   BMI:     BP:137/87  HR:97bpm  TEMP: ( )  RESP:  Physical Exam Vitals and nursing note reviewed.  Constitutional:      General: He is not in acute distress.    Appearance: Normal appearance. He is not ill-appearing.  HENT:     Head:  Normocephalic and atraumatic.     Right Ear: External ear normal.     Left Ear: External ear normal.     Nose: No congestion.  Eyes:     Extraocular Movements: Extraocular movements intact.  Cardiovascular:     Rate and Rhythm: Normal rate.     Pulses: Normal pulses.  Pulmonary:     Effort: Pulmonary effort is normal. No respiratory distress.  Abdominal:     General: There is no distension.     Palpations: Abdomen is soft.  Musculoskeletal:        General: No tenderness or signs of injury.     Cervical back: Neck supple.     Right lower leg: No edema.     Left lower leg: No edema.     Comments: Patient has good distal strength without clonus.  Skin:    Findings: No erythema or rash.  Neurological:     General: No focal deficit present.     Mental Status: He is alert and oriented to person, place, and time.     Sensory: No sensory deficit.     Motor: No weakness or abnormal muscle tone.     Coordination: Coordination normal.  Psychiatric:        Mood and Affect: Mood normal.        Behavior: Behavior normal.      Imaging: No results found. "

## 2024-07-28 NOTE — Procedures (Signed)
 Lumbosacral Transforaminal Epidural Steroid Injection - Sub-Pedicular Approach with Fluoroscopic Guidance  Patient: Adam Vaughn      Date of Birth: Jun 15, 1976 MRN: [id number] PCP: Garald Karlynn GAILS, MD      Visit Date: 07/23/2024   Universal Protocol:    Date/Time: 07/23/2024  Consent Given By: the patient  Position: PRONE  Additional Comments: Vital signs were monitored before and after the procedure. Patient was prepped and draped in the usual sterile fashion. The correct patient, procedure, and site was verified.   Injection Procedure Details:   Procedure diagnoses: Lumbar radiculopathy [M54.16]    Meds Administered:  Meds ordered this encounter  Medications   methylPREDNISolone  acetate (DEPO-MEDROL ) injection 40 mg    Laterality: Bilateral  Location/Site: L3  Needle:5.0 in., 22 ga.  Short bevel or Quincke spinal needle  Needle Placement: Transforaminal  Findings:    -Comments: Excellent flow of contrast along the nerve, nerve root and into the epidural space.  Procedure Details: After squaring off the end-plates to get a true AP view, the C-arm was positioned so that an oblique view of the foramen as noted above was visualized. The target area is just inferior to the nose of the scotty dog or sub pedicular. The soft tissues overlying this structure were infiltrated with 2-3 ml. of 1% Lidocaine  without Epinephrine .  The spinal needle was inserted toward the target using a trajectory view along the fluoroscope beam.  Under AP and lateral visualization, the needle was advanced so it did not puncture dura and was located close the 6 O'Clock position of the pedical in AP tracterory. Biplanar projections were used to confirm position. Aspiration was confirmed to be negative for CSF and/or blood. A 1-2 ml. volume of Isovue -250 was injected and flow of contrast was noted at each level. Radiographs were obtained for documentation purposes.   After attaining the  desired flow of contrast documented above, a 0.5 to 1.0 ml test dose of 0.25% Marcaine  was injected into each respective transforaminal space.  The patient was observed for 90 seconds post injection.  After no sensory deficits were reported, and normal lower extremity motor function was noted,   the above injectate was administered so that equal amounts of the injectate were placed at each foramen (level) into the transforaminal epidural space.   Additional Comments:  The patient tolerated the procedure well Dressing: 2 x 2 sterile gauze and Band-Aid    Post-procedure details: Patient was observed during the procedure. Post-procedure instructions were reviewed.  Patient left the clinic in stable condition.

## 2024-08-05 ENCOUNTER — Ambulatory Visit: Admitting: Orthopedic Surgery

## 2024-08-05 ENCOUNTER — Encounter: Payer: Self-pay | Admitting: Orthopedic Surgery

## 2024-08-05 DIAGNOSIS — M19011 Primary osteoarthritis, right shoulder: Secondary | ICD-10-CM

## 2024-08-05 NOTE — Progress Notes (Signed)
 "  Office Visit Note   Patient: Adam Vaughn           Date of Birth: 09/14/1975           MRN: [id number] Visit Date: 08/05/2024 Requested by: Garald Karlynn GAILS, MD [address] [place],  [place] [address] PCP: Plotnikov, Karlynn GAILS, MD  Subjective: Chief Complaint  Patient presents with   Other    Shoulder and knee pain    HPI: CALTON HARSHFIELD is a 49 y.o. male who presents to the office reporting continued right shoulder and left knee pain.  Patient has AC joint arthritis from MRI scan done several years ago.  His last [facility] joint injection was 03/09/2024 which helped until about a month ago.  Had bilateral knee cortisone injections in August.  Also has had recent C-spine ESI with Dr. Eldonna on 05/18/2024 which helped his scapular pain..                ROS: All systems reviewed are negative as they relate to the chief complaint within the history of present illness.  Patient denies fevers or chills.  Assessment & Plan: Visit Diagnoses:  1. Arthritis of right acromioclavicular joint     Plan: Impression is symptomatic right shoulder AC joint arthritis.  Has had prior biceps tenodesis.  AC joint is definitely symptomatic now with positive response to injection but it does not last.  Plan at this time is right shoulder arthroscopy with arthroscopic distal clavicle excision to be performed in mid April.  Risks and benefits are discussed with the patient do not limited to infection or vessel damage incomplete pain relief as well as a period of rehabilitation which is slightly less than what he had for his biceps tendon work.  All questions answered  Follow-Up Instructions: No follow-ups on file.   Orders:  No orders of the defined types were placed in this encounter.  No orders of the defined types were placed in this encounter.     Procedures: No procedures performed   Clinical Data: No additional findings.  Objective: Vital Signs: There were no vitals taken for  this visit.  Physical Exam:  Constitutional: Patient appears well-developed HEENT:  Head: Normocephalic Eyes:EOM are normal Neck: Normal range of motion Cardiovascular: Normal rate Pulmonary/chest: Effort normal Neurologic: Patient is alert Skin: Skin is warm Psychiatric: Patient has normal mood and affect  Ortho Exam: Ortho exam demonstrates tenderness to the [facility] joint to direct palpation right versus left.  Rotator cuff strength is intact.  Does have a little bit of popping with crossarm adduction on the right compared to the left.  Cervical spine range of motion intact  Specialty Comments:  MRI CERVICAL SPINE WITHOUT CONTRAST   TECHNIQUE: Multiplanar, multisequence MR imaging of the cervical spine was performed. No intravenous contrast was administered.   COMPARISON:  Cervical spine radiographs 12/02/2023.   FINDINGS: Alignment: No significant spondylolisthesis.   Vertebrae: Cervical vertebral body height is maintained. No significant marrow edema or focal worrisome marrow lesion.   Cord: No signal abnormality identified within the cervical spinal cord. Mild flattening of the ventral spinal cord at C5-C6 as described below.   Posterior Fossa, vertebral arteries, paraspinal tissues: No abnormality identified within included portions of the posterior fossa. Flow voids preserved within visible portions of the cervical vertebral arteries. No paraspinal mass or collection.   Disc levels:   No more than mild disc degeneration within the cervical spine.   C2-C3: No significant disc herniation  or stenosis.   C3-C4: Slight disc bulge. Mild bilateral uncovertebral hypertrophy. No significant spinal canal or foraminal stenosis.   C4-C5: Slight disc bulge. No significant spinal canal or foraminal stenosis.   C5-C6: A central disc protrusion results in mild spinal canal stenosis, focally effacing the ventral thecal sac and slightly flattening the ventral aspect of the  spinal cord. No significant foraminal stenosis.   C6-C7: No significant disc herniation or stenosis.   C7-T1: This level is imaged in the sagittal plane only. No significant disc herniation or stenosis.   Other: Partially imaged mucous retention cyst within the left maxillary sinus, measuring at least 2.6 cm.   IMPRESSION: 1. Cervical spondylosis as outlined within the body of the report. 2. At C5-C6, a central disc protrusion results in mild spinal canal stenosis, focally effacing the ventral thecal sac and slightly flattening the ventral aspect of the spinal cord. 3. No significant spinal canal stenosis at the remaining levels. 4. No significant foraminal stenosis. 5. No more than mild disc degeneration. 6. Partially imaged left maxillary sinus mucous retention cyst (measuring at least 2.6 cm).     Electronically Signed   By: Rockey Childs D.O.   On: 03/25/2024 19:52 ----- Bilateral Upper Extremity NCV with EMG 12/31/2023  EMG & NCV Findings: All nerve conduction studies (as indicated in the following tables) were within normal limits.  All left vs. right side differences were within normal limits.     All examined muscles (as indicated in the following table) showed no evidence of electrical instability.     Impression: Essentially NORMAL electrodiagnostic study of both upper limbs.  There is no significant electrodiagnostic evidence of nerve entrapment, brachial plexopathy, cervical radiculopathy or generalized peripheral neuropathy.     As you know, purely sensory or demyelinating radiculopathies and chemical radiculitis may not be detected with this particular electrodiagnostic study. **This electrodiagnostic study cannot rule out small fiber polyneuropathy and dysesthesias from central pain syndromes such as stroke or central pain sensitization syndromes such as fibromyalgia.  Myotomal referral pain from trigger points is also not excluded.   Recommendations: 1.   Follow-up with referring physician. 2.  Continue current management of symptoms.   ___________________________ Prentice Eldonna BETTERS Board Certified, American Board of Physical Medicine and Rehabilitation  Imaging: No results found.   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   Chronic eczematous otitis externa of both ears 05/16/2022   Hypertrophy of inferior nasal turbinate 05/16/2022   Situational depression 05/14/2022   Coronary atherosclerosis 03/26/2022   Dyslipidemia 01/11/2022   Chronic pain syndrome 11/07/2021   Piriformis syndrome 11/07/2021   Bladder neck obstruction 09/04/2021   Asthmatic bronchitis 09/04/2021   Pain in right shoulder 06/29/2021   Meteorism 06/01/2021   Sinus congestion 02/28/2021   Vertigo 02/17/2021   Headache 02/17/2021   Fever 02/17/2021   Falls 02/17/2021   Allergic rhinitis 10/25/2020   Family history of acute polio 10/25/2020   Chest pain, atypical 10/10/2020   S/P laparoscopic cholecystectomy 10/10/2020   Hyperglycemia 10/10/2020   Acute cholecystitis 09/30/2020   Insomnia disorder 09/26/2020   Knee pain 06/23/2020   Low vitamin B12 level 05/31/2020   Ankle sprain 05/31/2020  Constipation 05/31/2020   Degenerative spondylolisthesis 04/11/2020   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   DDD (degenerative disc disease), lumbar 01/20/2020   Left lumbar radiculitis 01/20/2020   Vitamin D  deficiency 01/15/2020   Low back pain 01/12/2020   IBS (irritable bowel syndrome) 01/12/2020   Nephrolithiasis 01/12/2020   Erectile dysfunction 01/12/2020   GERD (gastroesophageal reflux  disease) 01/12/2020   Past Medical History:  Diagnosis Date   Arthritis    Chronic back pain    Family history of colon cancer    sister   GERD (gastroesophageal reflux disease)    History of kidney stones    Hyperlipidemia    Hypertension     Family History  Problem Relation Age of Onset   Heart disease Father 70       MI   Hypertension Father    Heart attack Father    Other Father        Small intestine removed but does not know why   Diabetes Sister    Heart disease Sister 37       MI   Heart attack Sister    Heart failure Sister    Colon cancer Sister    Colon polyps Sister    Colon polyps Sister    Healthy Sister    Healthy Sister    Other Brother        MVA   Healthy Brother    Healthy Brother    Healthy Brother    Healthy Brother    Healthy Brother    Healthy Brother    Liver disease Neg Hx    Pancreatic cancer Neg Hx    Esophageal cancer Neg Hx    Stomach cancer Neg Hx     Past Surgical History:  Procedure Laterality Date   BACK SURGERY  04/11/2020   CHOLECYSTECTOMY N/A 10/01/2020   Procedure: LAPAROSCOPIC CHOLECYSTECTOMY;  Surgeon: Curvin Mt III, MD;  Location: [facility];  Service: General;  Laterality: N/A;   COLONOSCOPY  1991   IBS   endocolon  09/15/2021   [facility], GERD   HAND SURGERY Bilateral    SHOULDER SURGERY Right 08/2021   SPINAL CORD STIMULATOR IMPLANT  2022   SPINAL CORD STIMULATOR REMOVAL  2022   TONSILLECTOMY     VASECTOMY     Social History   Occupational History   Not on file  Tobacco Use   Smoking status: Never    Passive exposure: Never   Smokeless tobacco: Never  Vaping Use   Vaping status: Never Used  Substance and Sexual Activity   Alcohol use: Not Currently    Comment: States quit around age 53   Drug use: Never   Sexual activity: Not Currently    Birth control/protection: Surgical    Comment: vastectomy        "

## 2024-08-10 ENCOUNTER — Ambulatory Visit: Admitting: Internal Medicine

## 2024-09-21 ENCOUNTER — Encounter: Admitting: Internal Medicine

## 2024-09-21 ENCOUNTER — Ambulatory Visit
